# Patient Record
Sex: Male | Born: 1955
Health system: Southern US, Community
[De-identification: ages and names within clinical notes are randomized; demographics above are authoritative.]

## PROBLEM LIST (undated history)

## (undated) DIAGNOSIS — I5020 Unspecified systolic (congestive) heart failure: Secondary | ICD-10-CM

## (undated) DIAGNOSIS — J449 Chronic obstructive pulmonary disease, unspecified: Secondary | ICD-10-CM

## (undated) DIAGNOSIS — G4733 Obstructive sleep apnea (adult) (pediatric): Secondary | ICD-10-CM

## (undated) DIAGNOSIS — I272 Pulmonary hypertension, unspecified: Secondary | ICD-10-CM

## (undated) DIAGNOSIS — I428 Other cardiomyopathies: Secondary | ICD-10-CM

## (undated) DIAGNOSIS — C439 Malignant melanoma of skin, unspecified: Secondary | ICD-10-CM

## (undated) HISTORY — DX: Unspecified systolic (congestive) heart failure: I50.20

## (undated) HISTORY — DX: Pulmonary hypertension, unspecified: I27.20

## (undated) HISTORY — DX: Obstructive sleep apnea (adult) (pediatric): G47.33

## (undated) HISTORY — PX: ADENOIDECTOMY: SUR15

## (undated) HISTORY — DX: Other cardiomyopathies: I42.8

---

## 1998-03-14 ENCOUNTER — Ambulatory Visit (HOSPITAL_BASED_OUTPATIENT_CLINIC_OR_DEPARTMENT_OTHER): Admission: RE | Admit: 1998-03-14 | Discharge: 1998-03-14 | Payer: Self-pay | Admitting: General Surgery

## 2019-06-01 ENCOUNTER — Ambulatory Visit: Payer: Self-pay | Admitting: *Deleted

## 2019-06-01 ENCOUNTER — Encounter (HOSPITAL_BASED_OUTPATIENT_CLINIC_OR_DEPARTMENT_OTHER): Payer: Self-pay | Admitting: *Deleted

## 2019-06-01 ENCOUNTER — Other Ambulatory Visit: Payer: Self-pay

## 2019-06-01 ENCOUNTER — Inpatient Hospital Stay (HOSPITAL_BASED_OUTPATIENT_CLINIC_OR_DEPARTMENT_OTHER)
Admission: EM | Admit: 2019-06-01 | Discharge: 2019-06-07 | DRG: 286 | Disposition: A | Discharge status: Home / Self Care | Payer: Self-pay | Attending: Internal Medicine | Admitting: Internal Medicine

## 2019-06-01 ENCOUNTER — Emergency Department (HOSPITAL_BASED_OUTPATIENT_CLINIC_OR_DEPARTMENT_OTHER): Payer: Self-pay

## 2019-06-01 DIAGNOSIS — Z87891 Personal history of nicotine dependence: Secondary | ICD-10-CM

## 2019-06-01 DIAGNOSIS — I429 Cardiomyopathy, unspecified: Secondary | ICD-10-CM

## 2019-06-01 DIAGNOSIS — I2729 Other secondary pulmonary hypertension: Secondary | ICD-10-CM | POA: Diagnosis present

## 2019-06-01 DIAGNOSIS — Z791 Long term (current) use of non-steroidal anti-inflammatories (NSAID): Secondary | ICD-10-CM

## 2019-06-01 DIAGNOSIS — J441 Chronic obstructive pulmonary disease with (acute) exacerbation: Secondary | ICD-10-CM | POA: Diagnosis present

## 2019-06-01 DIAGNOSIS — I509 Heart failure, unspecified: Secondary | ICD-10-CM

## 2019-06-01 DIAGNOSIS — I10 Essential (primary) hypertension: Secondary | ICD-10-CM

## 2019-06-01 DIAGNOSIS — I110 Hypertensive heart disease with heart failure: Principal | ICD-10-CM | POA: Diagnosis present

## 2019-06-01 DIAGNOSIS — I491 Atrial premature depolarization: Secondary | ICD-10-CM | POA: Diagnosis present

## 2019-06-01 DIAGNOSIS — I5021 Acute systolic (congestive) heart failure: Secondary | ICD-10-CM

## 2019-06-01 DIAGNOSIS — I4510 Unspecified right bundle-branch block: Secondary | ICD-10-CM | POA: Diagnosis present

## 2019-06-01 DIAGNOSIS — J986 Disorders of diaphragm: Secondary | ICD-10-CM | POA: Diagnosis present

## 2019-06-01 DIAGNOSIS — I2609 Other pulmonary embolism with acute cor pulmonale: Secondary | ICD-10-CM | POA: Diagnosis present

## 2019-06-01 DIAGNOSIS — E874 Mixed disorder of acid-base balance: Secondary | ICD-10-CM | POA: Diagnosis present

## 2019-06-01 DIAGNOSIS — I2781 Cor pulmonale (chronic): Secondary | ICD-10-CM

## 2019-06-01 DIAGNOSIS — E870 Hyperosmolality and hypernatremia: Secondary | ICD-10-CM | POA: Diagnosis not present

## 2019-06-01 DIAGNOSIS — I428 Other cardiomyopathies: Secondary | ICD-10-CM | POA: Diagnosis present

## 2019-06-01 DIAGNOSIS — I5041 Acute combined systolic (congestive) and diastolic (congestive) heart failure: Secondary | ICD-10-CM | POA: Diagnosis present

## 2019-06-01 DIAGNOSIS — Z6841 Body Mass Index (BMI) 40.0 and over, adult: Secondary | ICD-10-CM

## 2019-06-01 DIAGNOSIS — I5082 Biventricular heart failure: Secondary | ICD-10-CM | POA: Diagnosis present

## 2019-06-01 DIAGNOSIS — I493 Ventricular premature depolarization: Secondary | ICD-10-CM | POA: Diagnosis present

## 2019-06-01 DIAGNOSIS — Z23 Encounter for immunization: Secondary | ICD-10-CM

## 2019-06-01 DIAGNOSIS — E8881 Metabolic syndrome: Secondary | ICD-10-CM | POA: Diagnosis present

## 2019-06-01 DIAGNOSIS — Z20828 Contact with and (suspected) exposure to other viral communicable diseases: Secondary | ICD-10-CM | POA: Diagnosis present

## 2019-06-01 DIAGNOSIS — E662 Morbid (severe) obesity with alveolar hypoventilation: Secondary | ICD-10-CM | POA: Diagnosis present

## 2019-06-01 DIAGNOSIS — I471 Supraventricular tachycardia: Secondary | ICD-10-CM | POA: Diagnosis present

## 2019-06-01 DIAGNOSIS — I2510 Atherosclerotic heart disease of native coronary artery without angina pectoris: Secondary | ICD-10-CM | POA: Diagnosis present

## 2019-06-01 DIAGNOSIS — J9621 Acute and chronic respiratory failure with hypoxia: Secondary | ICD-10-CM | POA: Diagnosis present

## 2019-06-01 DIAGNOSIS — Z79899 Other long term (current) drug therapy: Secondary | ICD-10-CM

## 2019-06-01 HISTORY — DX: Chronic obstructive pulmonary disease, unspecified: J44.9

## 2019-06-01 LAB — CBC WITH DIFFERENTIAL/PLATELET
Abs Immature Granulocytes: 0.04 10*3/uL (ref 0.00–0.07)
Basophils Absolute: 0 10*3/uL (ref 0.0–0.1)
Basophils Relative: 0 %
Eosinophils Absolute: 0.1 10*3/uL (ref 0.0–0.5)
Eosinophils Relative: 1 %
HCT: 50.4 % (ref 39.0–52.0)
Hemoglobin: 15 g/dL (ref 13.0–17.0)
Immature Granulocytes: 0 %
Lymphocytes Relative: 13 %
Lymphs Abs: 1.2 10*3/uL (ref 0.7–4.0)
MCH: 28.1 pg (ref 26.0–34.0)
MCHC: 29.8 g/dL — ABNORMAL LOW (ref 30.0–36.0)
MCV: 94.6 fL (ref 80.0–100.0)
Monocytes Absolute: 0.8 10*3/uL (ref 0.1–1.0)
Monocytes Relative: 9 %
Neutro Abs: 7.2 10*3/uL (ref 1.7–7.7)
Neutrophils Relative %: 77 %
Platelets: 189 10*3/uL (ref 150–400)
RBC: 5.33 MIL/uL (ref 4.22–5.81)
RDW: 14.4 % (ref 11.5–15.5)
WBC: 9.4 10*3/uL (ref 4.0–10.5)
nRBC: 0 % (ref 0.0–0.2)

## 2019-06-01 LAB — COMPREHENSIVE METABOLIC PANEL
ALT: 28 U/L (ref 0–44)
AST: 24 U/L (ref 15–41)
Albumin: 3.9 g/dL (ref 3.5–5.0)
Alkaline Phosphatase: 64 U/L (ref 38–126)
Anion gap: 11 (ref 5–15)
BUN: 18 mg/dL (ref 8–23)
CO2: 35 mmol/L — ABNORMAL HIGH (ref 22–32)
Calcium: 8.8 mg/dL — ABNORMAL LOW (ref 8.9–10.3)
Chloride: 96 mmol/L — ABNORMAL LOW (ref 98–111)
Creatinine, Ser: 0.92 mg/dL (ref 0.61–1.24)
GFR calc Af Amer: >60 mL/min (ref >60)
GFR calc non Af Amer: >60 mL/min (ref >60)
Glucose, Bld: 164 mg/dL — ABNORMAL HIGH (ref 70–99)
Potassium: 3.6 mmol/L (ref 3.5–5.1)
Sodium: 142 mmol/L (ref 135–145)
Total Bilirubin: 0.8 mg/dL (ref 0.3–1.2)
Total Protein: 6.4 g/dL — ABNORMAL LOW (ref 6.5–8.1)

## 2019-06-01 LAB — BRAIN NATRIURETIC PEPTIDE: B Natriuretic Peptide: 322.5 pg/mL — ABNORMAL HIGH (ref 0.0–100.0)

## 2019-06-01 MED ORDER — ENOXAPARIN SODIUM 80 MG/0.8ML ~~LOC~~ SOLN
80.0000 mg | SUBCUTANEOUS | Status: DC
  Administered 2019-06-02 – 2019-06-07 (×6): 80 mg via SUBCUTANEOUS
  Filled 2019-06-01 (×6): qty 0.8

## 2019-06-01 MED ORDER — FUROSEMIDE 10 MG/ML IJ SOLN
40.0000 mg | Freq: Once | INTRAMUSCULAR | Status: AC
  Administered 2019-06-01: 40 mg via INTRAVENOUS
  Filled 2019-06-01: qty 4

## 2019-06-01 MED ORDER — INFLUENZA VAC SPLIT QUAD 0.5 ML IM SUSY
0.5000 mL | PREFILLED_SYRINGE | INTRAMUSCULAR | Status: AC
  Administered 2019-06-02: 11:00:00 0.5 mL via INTRAMUSCULAR
  Filled 2019-06-01: qty 0.5

## 2019-06-01 NOTE — ED Notes (Signed)
[facility] notified (Justin Rasmussen) - patient ready for transport 

## 2019-06-01 NOTE — H&P (Signed)
History and Physical    Justin Rasmussen OZH:[id number] DOB: [birth year] DOA: 06/01/2019  PCP: Patient, No Pcp Per  Patient coming from: Home and lives with wife  I have personally briefly reviewed patient's old medical records in [facility]  Chief Complaint: worsening shortness of breath and lower extremity edema  HPI: Justin Rasmussen is a 63 y.o. male with unknown medical history presented as a transfer from [facility] for worsening shortness of breath and lower extremity edema.  Patient has not seen a physician for several years.  Reports he was told he might have a diagnosis of COPD many years ago but has not had any issues with this.  He reports that for the past several weeks he has noticed increasing shortness of breath with exertion anophthalmia.  Also reports waking up in the middle of the night but unsure whether it is due to his breathing.  He also noticed more gradual lower extremity edema in the past several days.  He reportedly presented to the ED with oxygen saturation of 77% on room air and had to be placed on 4 L with improvement of oxygen saturation to 95%.  He denies any chest pain or palpitations.  Denies any fevers or new coughs.  Endorses a past history of tobacco use about 30 years ago.  Denies any alcohol or illicit drug use.  Reports that his father had a history of CHF and type 2 diabetes.  ED Course: He was afebrile and mildly hypertensive at times up to 150s over 100.  CBC showed no leukocytosis or anemia.CMP showed stable creatinine.  BNP of 323.  Chest x-ray showed right-sided effusion with underlying opacity representing atelectasis versus infiltrate.  EKG showed sinus tachycardia with right bundle branch block.  He received IV 40 mg Lasix in the ED with good urine output.  Review of Systems:  Constitutional: + Weight Change, No Fever ENT/Mouth: No sore throat, No Rhinorrhea Eyes: No Eye Pain, No Vision Changes Cardiovascular: No Chest Pain, + SOB, + PND, + Dyspnea on  Exertion, + Orthopnea,  +Edema, No Palpitations Respiratory: No Cough, No Sputum, No Wheezing,+ Dyspnea  Gastrointestinal: No Nausea, No Vomiting, No Diarrhea, No Constipation, No Pain Genitourinary: no Urinary Incontinence, No Urgency, No Flank Pain Musculoskeletal: No Arthralgias, No Myalgias Skin: No Skin Lesions, No Pruritus, Neuro: no Weakness, No Numbness,  No Loss of Consciousness, No Syncope Psych: No Anxiety/Panic, No Depression, decrease appetite Heme/Lymph: No Bruising, No Bleeding Past Medical History:  Diagnosis Date  . COPD (chronic obstructive pulmonary disease) (HCC)     History reviewed. No pertinent surgical history.   reports that he quit smoking about 30 years ago. His smoking use included cigarettes. He has never used smokeless tobacco. He reports previous alcohol use. No history on file for drug.  No Known Allergies  History reviewed. No pertinent family history. Father-CHF and diabetes  Prior to Admission medications   Not on File    Physical Exam: Vitals:   06/01/19 2030 06/01/19 2100 06/01/19 2130 06/01/19 2226  BP: 121/87 (!) 130/100 118/80 (!) 155/98  Pulse: (!) 40 98 92 (!) 104  Resp: (!) 22 (!) 24 (!) 23 20  Temp:    99.3 F (37.4 C)  TempSrc:    Oral  SpO2: 97% 93% 95% 98%  Weight:      Height:        Constitutional: NAD, calm, comfortable at 30 degrees incline lying in bed watching TV Vitals:   06/01/19 2030 06/01/19 2100 06/01/19  2130 06/01/19 2226  BP: 121/87 (!) 130/100 118/80 (!) 155/98  Pulse: (!) 40 98 92 (!) 104  Resp: (!) 22 (!) 24 (!) 23 20  Temp:    99.3 F (37.4 C)  TempSrc:    Oral  SpO2: 97% 93% 95% 98%  Weight:      Height:       Eyes: PERRL, lids and conjunctivae normal ENMT: Mucous membranes are moist. Posterior pharynx clear of any exudate or lesions.Normal dentition.  Neck: normal, supple, no masses Respiratory: Diffuse expiratory wheezing with decreased aeration especially to right lung base.  Notable shortness  of breath at breath on 4 L via nasal cannula. No accessory muscle use.  Cardiovascular: Regular rate and rhythm, no murmurs / rubs / gallops.  Significant 3+ pitting edema of bilateral lower extremity up to the knees.  abdomen: Obese abdomen, no tenderness, no masses palpated.  Bowel sounds positive.  Musculoskeletal: no clubbing / cyanosis. No joint deformity upper and lower extremities. Good ROM, no contractures. Normal muscle tone.  Skin: no rashes, lesions, ulcers. No induration.  Small abrasion with clean bandage on right pretibial region. Neurologic: CN 2-12 grossly intact. Sensation intact, DTR normal. Strength 5/5 in all 4.  Psychiatric: Normal judgment and insight. Alert and oriented x 3.  Somewhat depressed mood given new diagnosis.  Labs on Admission: I have personally reviewed following labs and imaging studies  CBC: Recent Labs  Lab 06/01/19 1630  WBC 9.4  NEUTROABS 7.2  HGB 15.0  HCT 50.4  MCV 94.6  PLT 189   Basic Metabolic Panel: Recent Labs  Lab 06/01/19 1630  NA 142  K 3.6  CL 96*  CO2 35*  GLUCOSE 164*  BUN 18  CREATININE 0.92  CALCIUM 8.8*   GFR: Estimated Creatinine Clearance: 125 mL/min (by C-G formula based on SCr of 0.92 mg/dL). Liver Function Tests: Recent Labs  Lab 06/01/19 1630  AST 24  ALT 28  ALKPHOS 64  BILITOT 0.8  PROT 6.4*  ALBUMIN 3.9   No results for input(s): LIPASE, AMYLASE in the last 168 hours. No results for input(s): AMMONIA in the last 168 hours. Coagulation Profile: No results for input(s): INR, PROTIME in the last 168 hours. Cardiac Enzymes: No results for input(s): CKTOTAL, CKMB, CKMBINDEX, TROPONINI in the last 168 hours. BNP (last 3 results) No results for input(s): PROBNP in the last 8760 hours. HbA1C: No results for input(s): HGBA1C in the last 72 hours. CBG: No results for input(s): GLUCAP in the last 168 hours. Lipid Profile: No results for input(s): CHOL, HDL, LDLCALC, TRIG, CHOLHDL, LDLDIRECT in the last  72 hours. Thyroid Function Tests: No results for input(s): TSH, T4TOTAL, FREET4, T3FREE, THYROIDAB in the last 72 hours. Anemia Panel: No results for input(s): VITAMINB12, FOLATE, FERRITIN, TIBC, IRON, RETICCTPCT in the last 72 hours. Urine analysis: No results found for: COLORURINE, APPEARANCEUR, LABSPEC, PHURINE, GLUCOSEU, HGBUR, BILIRUBINUR, KETONESUR, PROTEINUR, UROBILINOGEN, NITRITE, LEUKOCYTESUR  Radiological Exams on Admission: Dg Chest Portable 1 View  Result Date: 06/01/2019 CLINICAL DATA:  Shortness of breath with exertion. EXAM: PORTABLE CHEST 1 VIEW COMPARISON:  None. FINDINGS: Elevation right hemidiaphragm identified. There appears to be a right-sided effusion as well. Suspected cardiomegaly. The hila and mediastinum are unremarkable given portable technique. No pneumothorax. The left lung is clear. IMPRESSION: 1. There is elevation of the right hemidiaphragm. There is a right-sided effusion, possibly sub pulmonic, with underlying opacity, which could represent atelectasis or infiltrate. No other acute abnormalities are identified. Electronically Signed  By: Gerome Sam III M.D   On: 06/01/2019 16:50    EKG: Independently reviewed.   Assessment/Plan  Suspected congestive heart failure exacerbation -Start IV 40 mg Lasix daily -Obtain echocardiogram -Monitor daily I's and O's.  Daily weights. -Mildly elevated glucose on admission.  Will check hemoglobin A1c to evaluate for potential undiagnosed diabetes  COPD exacerbation secondary to CHF exacerbation - duonebs q4hrs  - albuterol PRN  -No new cough or sputum production to warrant antibiotics at this time  Hypertension -Continue to monitor with daily Lasix.  Will need ACE   DVT prophylaxis:.Lovenox Code Status:Full Family Communication: Plan discussed with patient at bedside  disposition Plan: Home with at least 2 midnight stays  Consults called:  Admission status: inpatient     Cordarro Spinnato T Almin Livingstone DO [facility]   If 7PM-7AM, please contact night-coverage [email or web address] Password [facility]  06/01/2019, 11:58 PM

## 2019-06-01 NOTE — ED Triage Notes (Signed)
Pt c/o SOb  With exertion and lower leg swelling  x 2 weeks

## 2019-06-01 NOTE — Telephone Encounter (Signed)
Patient's wife called to make NP appointment with provider- patient has swelling in both lags with slight weeping. Patient is SOB when laying and wife states he has gained weight. Patient has been advised to go to ED for evaluation and treatment. Not sure if patient will go- but he has been encouraged and wife is aware of advisement too. O2 sat 88, pulse- 102   Reason for Disposition . [1] Difficulty breathing with exertion (e.g., walking) AND [2] new onset or worsening  Answer Assessment - Initial Assessment Questions 1. ONSET: "When did the swelling start?" (e.g., minutes, hours, days)     2 days ago 2. LOCATION: "What part of the leg is swollen?"  "Are both legs swollen or just one leg?"     Knee down to foot- both legs- started in L leg and is now in R leg 3. SEVERITY: "How bad is the swelling?" (e.g., localized; mild, moderate, severe)  - Localized - small area of swelling localized to one leg  - MILD pedal edema - swelling limited to foot and ankle, pitting edema < 1/4 inch (6 mm) deep, rest and elevation eliminate most or all swelling  - MODERATE edema - swelling of lower leg to knee, pitting edema > 1/4 inch (6 mm) deep, rest and elevation only partially reduce swelling  - SEVERE edema - swelling extends above knee, facial or hand swelling present      Moderate/severe- patient has had weeping from the both legs- patient states can't physically see- but leaving wet areas 4. REDNESS: "Does the swelling look red or infected?"     no 5. PAIN: "Is the swelling painful to touch?" If so, ask: "How painful is it?"   (Scale 1-10; mild, moderate or severe)     No pain 6. FEVER: "Do you have a fever?" If so, ask: "What is it, how was it measured, and when did it start?"      No fever 7. CAUSE: "What do you think is causing the leg swelling?"     Not sure 8. MEDICAL HISTORY: "Do you have a history of heart failure, kidney disease, liver failure, or cancer?"     no 9. RECURRENT SYMPTOM: "Have  you had leg swelling before?" If so, ask: "When was the last time?" "What happened that time?"     no 10. OTHER SYMPTOMS: "Do you have any other symptoms?" (e.g., chest pain, difficulty breathing)       SOB, O2 stat- in the 80's 11. PREGNANCY: "Is there any chance you are pregnant?" "When was your last menstrual period?"       n/a  Protocols used: LEG SWELLING AND EDEMA-A-AH

## 2019-06-01 NOTE — ED Notes (Signed)
ED Provider at bedside. An RT at bedside

## 2019-06-01 NOTE — ED Notes (Signed)
After walking from [place] to lobby , pt with SOB and O2 sat  sat 77 on room . MD and RT at bedside

## 2019-06-01 NOTE — ED Provider Notes (Signed)
[facility] EMERGENCY DEPARTMENT Provider Note   CSN: [id number] Arrival date & time: 06/01/19  1618     History   Chief Complaint Chief Complaint  Patient presents with  . Shortness of Breath    HPI Justin Rasmussen is a 63 y.o. male.     The history is provided by the patient.  Shortness of Breath Severity:  Severe Onset quality:  Gradual Duration:  2 weeks Timing:  Constant Progression:  Worsening Chronicity:  New Context: activity   Relieved by:  Rest and sitting up Worsened by:  Activity (lying flat) Ineffective treatments:  None tried Associated symptoms: no abdominal pain, no chest pain, no cough, no fever, no headaches, no neck pain, no sputum production and no wheezing   Associated symptoms comment:  Leg swelling and pants fitting tighter around the abd.  DOE and orthopnea present.  Patient has not seen a doctor in greater than 5 years.  At one time was diagnosed with COPD.  No known COVID contacts.  Patient takes no medications at this time.  He is even noticed his legs have been weeping over the last few days.  Wife states they got a pulse oximeter and at home his oxygen level was reading between 70 and 80 at rest. Risk factors: obesity   Risk factors: no tobacco use     Past Medical History:  Diagnosis Date  . COPD (chronic obstructive pulmonary disease) (HCC)     There are no active problems to display for this patient.         Home Medications    Prior to Admission medications   Not on File    Family History No family history on file.  Social History Social History   Tobacco Use  . Smoking status: Not on file  Substance Use Topics  . Alcohol use: Not Currently  . Drug use: Not on file     Allergies   Patient has no known allergies.   Review of Systems Review of Systems  Constitutional: Negative for fever.  Respiratory: Positive for shortness of breath. Negative for cough, sputum production and wheezing.    Cardiovascular: Negative for chest pain.  Gastrointestinal: Negative for abdominal pain.  Musculoskeletal: Negative for neck pain.  Neurological: Negative for headaches.  All other systems reviewed and are negative.    Physical Exam Updated Vital Signs BP (!) 136/91   Pulse (!) 102   Temp 98.5 F (36.9 C)   Resp (!) 28   Ht 5\' 9"  (1.753 m)   Wt (!) 155.9 kg   SpO2 97%   BMI 50.77 kg/m   Physical Exam Vitals signs and nursing note reviewed.  Constitutional:      General: He is not in acute distress.    Appearance: He is well-developed. He is obese.  HENT:     Head: Normocephalic and atraumatic.  Eyes:     Conjunctiva/sclera: Conjunctivae normal.     Pupils: Pupils are equal, round, and reactive to light.  Neck:     Musculoskeletal: Normal range of motion and neck supple.  Cardiovascular:     Rate and Rhythm: Regular rhythm. Tachycardia present.     Heart sounds: No murmur.  Pulmonary:     Effort: Pulmonary effort is normal. Tachypnea present. No respiratory distress.     Breath sounds: Examination of the right-lower field reveals rales. Examination of the left-lower field reveals rales. Rales present. No wheezing.  Abdominal:     General: There is no distension.  Palpations: Abdomen is soft.     Tenderness: There is no abdominal tenderness. There is no guarding or rebound.  Musculoskeletal: Normal range of motion.        General: No tenderness.     Right lower leg: Edema present.     Left lower leg: Edema present.     Comments: 3+ pitting edema above the knee and weeping clear fluid from the lower legs  Skin:    General: Skin is warm and dry.     Findings: No erythema or rash.  Neurological:     General: No focal deficit present.     Mental Status: He is alert and oriented to person, place, and time.  Psychiatric:        Mood and Affect: Mood normal.        Behavior: Behavior normal.      ED Treatments / Results  Labs (all labs ordered are listed, but  only abnormal results are displayed) Labs Reviewed  CBC WITH DIFFERENTIAL/PLATELET - Abnormal; Notable for the following components:      Result Value   MCHC 29.8 (*)    All other components within normal limits  COMPREHENSIVE METABOLIC PANEL - Abnormal; Notable for the following components:   Chloride 96 (*)    CO2 35 (*)    Glucose, Bld 164 (*)    Calcium 8.8 (*)    Total Protein 6.4 (*)    All other components within normal limits  BRAIN NATRIURETIC PEPTIDE - Abnormal; Notable for the following components:   B Natriuretic Peptide 322.5 (*)    All other components within normal limits  SARS CORONAVIRUS 2 (TAT 6-24 HRS)    EKG EKG Interpretation  Date/Time:  Friday June 01 2019 16:28:58 EDT Ventricular Rate:  112 PR Interval:    QRS Duration: 136 QT Interval:  361 QTC Calculation: 493 R Axis:   -171 Text Interpretation:  Sinus tachycardia with irregular rate Right bundle branch block Anteroseptal infarct, age indeterminate Minimal ST elevation, inferior leads No previous tracing Confirmed by Blanchie Dessert [phone]) on 06/01/2019 5:42:48 PM   Radiology Dg Chest Portable 1 View  Result Date: 06/01/2019 CLINICAL DATA:  Shortness of breath with exertion. EXAM: PORTABLE CHEST 1 VIEW COMPARISON:  None. FINDINGS: Elevation right hemidiaphragm identified. There appears to be a right-sided effusion as well. Suspected cardiomegaly. The hila and mediastinum are unremarkable given portable technique. No pneumothorax. The left lung is clear. IMPRESSION: 1. There is elevation of the right hemidiaphragm. There is a right-sided effusion, possibly sub pulmonic, with underlying opacity, which could represent atelectasis or infiltrate. No other acute abnormalities are identified. Electronically Signed   By: Dorise Bullion III M.D   On: 06/01/2019 16:50    Procedures Procedures (including critical care time)  Medications Ordered in ED Medications  furosemide (LASIX) injection 40 mg (40  mg Intravenous Given 06/01/19 1801)     Initial Impression / Assessment and Plan / ED Course  I have reviewed the triage vital signs and the nursing notes.  Pertinent labs & imaging results that were available during my care of the patient were reviewed by me and considered in my medical decision making (see chart for details).        Patient is a 63 year old obese male who has not received medical care in greater than 5 years presenting today with 1 to 2 weeks of worsening exertional dyspnea.  On exam patient appears fluid overloaded with 3+ pitting edema in bilateral lower extremities with weeping,  abdominal fullness and rales on exam.  Initial saturation upon walking into the building was 77% on room air.  On 4 L patient's oxygen saturation is 95%.  Initially he was tachypneic in the 40s which is improved to the 30s and high 20s.  Patient's chest x-ray is consistent with a right-sided effusion and some atelectasis.  He has no symptoms today suggestive of COVID and is not having any infectious etiology.  BNP is elevated at 300, EKG with sinus tachycardia and a right bundle branch block.  There are no old to compare to but no signs of ST elevation today.  Patient at no time has had any chest discomfort or pain.  Renal function today is normal and blood sugar is 164.  Patient given IV Lasix.  Will admit for further work-up and evaluation of new onset CHF.  CRITICAL CARE Performed by: Devan Danzer Total critical care time: 30 minutes Critical care time was exclusive of separately billable procedures and treating other patients. Critical care was necessary to treat or prevent imminent or life-threatening deterioration. Critical care was time spent personally by me on the following activities: development of treatment plan with patient and/or surrogate as well as nursing, discussions with consultants, evaluation of patient's response to treatment, examination of patient, obtaining history from  patient or surrogate, ordering and performing treatments and interventions, ordering and review of laboratory studies, ordering and review of radiographic studies, pulse oximetry and re-evaluation of patient's condition.   Final Clinical Impressions(s) / ED Diagnoses   Final diagnoses:  Acute congestive heart failure, unspecified heart failure type [facility])    ED Discharge Orders    None       Gwyneth Sprout, MD 06/01/19 1816

## 2019-06-01 NOTE — Progress Notes (Signed)
[facility] transfer  63 year old obese male who has not seen a physician in at least 5 years presented to [facility] with several weeks of dyspnea on exertion and orthopnea.  He presented with oxygen saturation of 77% on room air and was tachycardic and tachypneic. 3+LE weeping edema.  Improved on 4 L with O2 sats of 95%.  BNP of 300.  Chest x-ray shows pulmonary edema.  Has already received IV Lasix. Creatinine and other labs stable.

## 2019-06-02 ENCOUNTER — Inpatient Hospital Stay (HOSPITAL_COMMUNITY): Payer: Self-pay

## 2019-06-02 DIAGNOSIS — J9601 Acute respiratory failure with hypoxia: Secondary | ICD-10-CM

## 2019-06-02 DIAGNOSIS — I5021 Acute systolic (congestive) heart failure: Secondary | ICD-10-CM

## 2019-06-02 DIAGNOSIS — I5031 Acute diastolic (congestive) heart failure: Secondary | ICD-10-CM

## 2019-06-02 DIAGNOSIS — R609 Edema, unspecified: Secondary | ICD-10-CM

## 2019-06-02 LAB — BASIC METABOLIC PANEL
Anion gap: 7 (ref 5–15)
Anion gap: 9 (ref 5–15)
BUN: 15 mg/dL (ref 8–23)
BUN: 15 mg/dL (ref 8–23)
CO2: 38 mmol/L — ABNORMAL HIGH (ref 22–32)
CO2: 38 mmol/L — ABNORMAL HIGH (ref 22–32)
Calcium: 8.3 mg/dL — ABNORMAL LOW (ref 8.9–10.3)
Calcium: 8.5 mg/dL — ABNORMAL LOW (ref 8.9–10.3)
Chloride: 95 mmol/L — ABNORMAL LOW (ref 98–111)
Chloride: 96 mmol/L — ABNORMAL LOW (ref 98–111)
Creatinine, Ser: 0.89 mg/dL (ref 0.61–1.24)
Creatinine, Ser: 0.97 mg/dL (ref 0.61–1.24)
GFR calc Af Amer: >60 mL/min (ref >60)
GFR calc Af Amer: >60 mL/min (ref >60)
GFR calc non Af Amer: >60 mL/min (ref >60)
GFR calc non Af Amer: >60 mL/min (ref >60)
Glucose, Bld: 121 mg/dL — ABNORMAL HIGH (ref 70–99)
Glucose, Bld: 130 mg/dL — ABNORMAL HIGH (ref 70–99)
Potassium: 3.6 mmol/L (ref 3.5–5.1)
Potassium: 3.8 mmol/L (ref 3.5–5.1)
Sodium: 141 mmol/L (ref 135–145)
Sodium: 142 mmol/L (ref 135–145)

## 2019-06-02 LAB — MAGNESIUM: Magnesium: 2.1 mg/dL (ref 1.7–2.4)

## 2019-06-02 LAB — ECHOCARDIOGRAM COMPLETE
Height: 69 in
Weight: 5376 oz

## 2019-06-02 LAB — CBC
HCT: 48.3 % (ref 39.0–52.0)
Hemoglobin: 15.2 g/dL (ref 13.0–17.0)
MCH: 29.6 pg (ref 26.0–34.0)
MCHC: 31.5 g/dL (ref 30.0–36.0)
MCV: 94 fL (ref 80.0–100.0)
Platelets: 167 10*3/uL (ref 150–400)
RBC: 5.14 MIL/uL (ref 4.22–5.81)
RDW: 14.4 % (ref 11.5–15.5)
WBC: 9.7 10*3/uL (ref 4.0–10.5)
nRBC: 0 % (ref 0.0–0.2)

## 2019-06-02 LAB — HEMOGLOBIN A1C
Hgb A1c MFr Bld: 6 % — ABNORMAL HIGH (ref 4.8–5.6)
Mean Plasma Glucose: 125.5 mg/dL

## 2019-06-02 LAB — SARS CORONAVIRUS 2 (TAT 6-24 HRS): SARS Coronavirus 2: NEGATIVE

## 2019-06-02 LAB — HIV ANTIBODY (ROUTINE TESTING W REFLEX): HIV Screen 4th Generation wRfx: NONREACTIVE

## 2019-06-02 MED ORDER — IPRATROPIUM-ALBUTEROL 0.5-2.5 (3) MG/3ML IN SOLN
3.0000 mL | Freq: Three times a day (TID) | RESPIRATORY_TRACT | Status: DC
  Administered 2019-06-02 – 2019-06-04 (×7): 3 mL via RESPIRATORY_TRACT
  Filled 2019-06-02 (×7): qty 3

## 2019-06-02 MED ORDER — FUROSEMIDE 10 MG/ML IJ SOLN
80.0000 mg | Freq: Two times a day (BID) | INTRAMUSCULAR | Status: AC
  Administered 2019-06-02 – 2019-06-06 (×8): 80 mg via INTRAVENOUS
  Filled 2019-06-02 (×9): qty 8

## 2019-06-02 MED ORDER — ALBUTEROL SULFATE (2.5 MG/3ML) 0.083% IN NEBU: 2.5000 mg | INHALATION_SOLUTION | RESPIRATORY_TRACT | Status: DC | PRN

## 2019-06-02 MED ORDER — IPRATROPIUM-ALBUTEROL 0.5-2.5 (3) MG/3ML IN SOLN
RESPIRATORY_TRACT | Status: AC
  Administered 2019-06-02: 3 mL
  Filled 2019-06-02: qty 3

## 2019-06-02 MED ORDER — IPRATROPIUM-ALBUTEROL 0.5-2.5 (3) MG/3ML IN SOLN
3.0000 mL | RESPIRATORY_TRACT | Status: DC
  Administered 2019-06-02 (×2): 3 mL via RESPIRATORY_TRACT
  Filled 2019-06-02: qty 30

## 2019-06-02 MED ORDER — MOMETASONE FURO-FORMOTEROL FUM 100-5 MCG/ACT IN AERO
2.0000 | INHALATION_SPRAY | Freq: Two times a day (BID) | RESPIRATORY_TRACT | Status: DC
  Administered 2019-06-02 – 2019-06-07 (×8): 2 via RESPIRATORY_TRACT
  Filled 2019-06-02: qty 8.8

## 2019-06-02 MED ORDER — POTASSIUM CHLORIDE CRYS ER 20 MEQ PO TBCR
40.0000 meq | EXTENDED_RELEASE_TABLET | Freq: Once | ORAL | Status: AC
  Administered 2019-06-02: 40 meq via ORAL
  Filled 2019-06-02: qty 4

## 2019-06-02 NOTE — TOC Initial Note (Signed)
Transition of Care [facility]) - Initial/Assessment Note    Patient Details  Name: Justin Rasmussen MRN: [id number] Date of Birth: March 12, 1956  [facility]) CM/SW Contact:    Zenon Mayo, RN Phone Number: 06/02/2019, 3:46 PM  Clinical Narrative:                 Patient is from home with spouse, he has no insurance, he states he will need ast with Match letter at discharge.  He has a follow up apt with MD at [facility] on Tuesday, listed on AVS.    Expected Discharge Plan: Home/Self Care Barriers to Discharge: No Barriers Identified   Patient Goals and CMS Choice Patient states their goals for this hospitalization and ongoing recovery are:: get better   Choice offered to / list presented to : NA  Expected Discharge Plan and Services Expected Discharge Plan: Home/Self Care In-house Referral: NA Discharge Planning Services: CM Consult Post Acute Care Choice: NA Living arrangements for the past 2 months: Single Family Home                 DME Arranged: (NA)         HH Arranged: NA          Prior Living Arrangements/Services Living arrangements for the past 2 months: Single Family Home Lives with:: Spouse Patient language and need for interpreter reviewed:: Yes Do you feel safe going back to the place where you live?: Yes      Need for Family Participation in Patient Care: Yes (Comment) Care giver support system in place?: Yes (comment)   Criminal Activity/Legal Involvement Pertinent to Current Situation/Hospitalization: No - Comment as needed  Activities of Daily Living Home Assistive Devices/Equipment: Dentures (specify type), Eyeglasses ADL Screening (condition at time of admission) Patient's cognitive ability adequate to safely complete daily activities?: Yes Is the patient deaf or have difficulty hearing?: No Does the patient have difficulty seeing, even when wearing glasses/contacts?: No Does the patient have difficulty concentrating,  remembering, or making decisions?: No Patient able to express need for assistance with ADLs?: Yes Does the patient have difficulty dressing or bathing?: No Independently performs ADLs?: Yes (appropriate for developmental age) Does the patient have difficulty walking or climbing stairs?: Yes Weakness of Legs: None Weakness of Arms/Hands: None  Permission Sought/Granted                  Emotional Assessment Appearance:: Appears stated age Attitude/Demeanor/Rapport: Gracious Affect (typically observed): Appropriate Orientation: : Oriented to Self, Oriented to Place, Oriented to  Time, Oriented to Situation Alcohol / Substance Use: Not Applicable Psych Involvement: No (comment)  Admission diagnosis:  Acute congestive heart failure, unspecified heart failure type [facility]) [I50.9] Patient Active Problem List   Diagnosis Date Noted  . Congestive heart failure (CHF) ([place]) 06/01/2019  . CHF (congestive heart failure) ([place]) 06/01/2019   PCP:  Patient, No Pcp Per Pharmacy:  No Pharmacies Listed    Social Determinants of Health (SDOH) Interventions    Readmission Risk Interventions No flowsheet data found.

## 2019-06-02 NOTE — Progress Notes (Signed)
  Echocardiogram 2D Echocardiogram has been performed.  Burnett Kanaris 06/02/2019, 10:22 AM

## 2019-06-02 NOTE — Progress Notes (Signed)
Patient ID: Justin Rasmussen, male   DOB: [birth year], 63 y.o.   MRN: [id number]  PROGRESS NOTE    Brick Barsky  NWG:[id number] DOB: 1956/06/18 DOA: 06/01/2019 PCP: Patient, No Pcp Per   Brief Narrative:  63 year old male with unknown medical history presented with worsening shortness of breath along with lower extremity edema on 06/01/2019.  He was found to be hypoxic on presentation and chest x-ray showed right-sided effusion with underlying opacity representing atelectasis versus infiltrate.  He was started on intravenous Lasix.  Assessment & Plan:   Acute hypoxic respiratory failure Probable acute diastolic CHF COPD, doubt that patient has exacerbation Undiagnosed probable obstructive sleep apnea versus obesity hypoventilation syndrome -On presentation, patient required 4 L oxygen.  Does not wear oxygen at home. -Currently requiring oxygen via nasal cannula at 2 L/min.  Wean off as able. -Although BNP was not that elevated, patient is extremely swollen in his lower extremities.  Patient does not have any fever or cough and does not show signs of pneumonia. -Will increase Lasix to 80 mg IV every 12 hours.  Check lower extremity duplex ultrasound.  Strict input and output.  Daily weights.  Follow 2D echo.  Might need cardiology evaluation as an outpatient. -Continue nebs.  Will add Dulera.  Not wheezing currently, no need for steroids at this time. -Might need outpatient sleep study and/or pulmonary evaluation  Morbid obesity  -Outpatient follow-up.  Counseled about lifestyle changes and weight loss    DVT prophylaxis: Lovenox Code Status: Full Family Communication: None at bedside Disposition Plan: Home in 2 to 3 days once clinically improved  Consultants: None  Procedures: None  Antimicrobials: None   Subjective: Patient seen and examined at bedside.  He feels slightly better but still feels very swollen and short of breath.  No current chest pain.  Patient denies any fever or  cough.  Objective: Vitals:   06/02/19 0456 06/02/19 0729 06/02/19 0804 06/02/19 0900  BP:  115/73    Pulse: 96 91  88  Resp: 18 19    Temp:  98.4 F (36.9 C)    TempSrc:  Oral    SpO2: 94% 95% 95%   Weight:      Height:        Intake/Output Summary (Last 24 hours) at 06/02/2019 0950 Last data filed at 06/02/2019 0700 Gross per 24 hour  Intake -  Output 1600 ml  Net -1600 ml   Filed Weights   06/01/19 1625 06/01/19 2226 06/02/19 0453  Weight: (!) 155.9 kg (!) 152 kg (!) 152.4 kg    Examination:  General exam: Appears calm and comfortable.  Looks older than stated age. Respiratory system: Bilateral decreased breath sounds at bases with basilar crackles Cardiovascular system: S1 & S2 heard, Rate controlled Gastrointestinal system: Abdomen is morbidly obese, nondistended, soft and nontender. Normal bowel sounds heard. Extremities: No cyanosis, clubbing; bilateral lower extremity 2-3+ edema Central nervous system: Alert and oriented. No focal neurological deficits. Moving extremities Skin: No rashes, lesions or ulcers Psychiatry: Flat affect    Data Reviewed: I have personally reviewed following labs and imaging studies  CBC: Recent Labs  Lab 06/01/19 1630 06/02/19 0000  WBC 9.4 9.7  NEUTROABS 7.2  --   HGB 15.0 15.2  HCT 50.4 48.3  MCV 94.6 94.0  PLT 189 167   Basic Metabolic Panel: Recent Labs  Lab 06/01/19 1630 06/02/19 0000  NA 142 142  K 3.6 3.6  CL 96* 95*  CO2 35* 38*  GLUCOSE 164*  130*  BUN 18 15  CREATININE 0.92 0.97  CALCIUM 8.8* 8.5*   GFR: Estimated Creatinine Clearance: 117 mL/min (by C-G formula based on SCr of 0.97 mg/dL). Liver Function Tests: Recent Labs  Lab 06/01/19 1630  AST 24  ALT 28  ALKPHOS 64  BILITOT 0.8  PROT 6.4*  ALBUMIN 3.9   No results for input(s): LIPASE, AMYLASE in the last 168 hours. No results for input(s): AMMONIA in the last 168 hours. Coagulation Profile: No results for input(s): INR, PROTIME in the  last 168 hours. Cardiac Enzymes: No results for input(s): CKTOTAL, CKMB, CKMBINDEX, TROPONINI in the last 168 hours. BNP (last 3 results) No results for input(s): PROBNP in the last 8760 hours. HbA1C: Recent Labs    06/02/19 0344  HGBA1C 6.0*   CBG: No results for input(s): GLUCAP in the last 168 hours. Lipid Profile: No results for input(s): CHOL, HDL, LDLCALC, TRIG, CHOLHDL, LDLDIRECT in the last 72 hours. Thyroid Function Tests: No results for input(s): TSH, T4TOTAL, FREET4, T3FREE, THYROIDAB in the last 72 hours. Anemia Panel: No results for input(s): VITAMINB12, FOLATE, FERRITIN, TIBC, IRON, RETICCTPCT in the last 72 hours. Sepsis Labs: No results for input(s): PROCALCITON, LATICACIDVEN in the last 168 hours.  Recent Results (from the past 240 hour(s))  SARS CORONAVIRUS 2 (TAT 6-24 HRS) Nasopharyngeal Nasopharyngeal Swab     Status: None   Collection Time: 06/01/19  5:45 PM   Specimen: Nasopharyngeal Swab  Result Value Ref Range Status   SARS Coronavirus 2 NEGATIVE NEGATIVE Final    Comment: (NOTE) SARS-CoV-2 target nucleic acids are NOT DETECTED. The SARS-CoV-2 RNA is generally detectable in upper and lower respiratory specimens during the acute phase of infection. Negative results do not preclude SARS-CoV-2 infection, do not rule out co-infections with other pathogens, and should not be used as the sole basis for treatment or other patient management decisions. Negative results must be combined with clinical observations, patient history, and epidemiological information. The expected result is Negative. Fact Sheet for Patients: HairSlick.no Fact Sheet for Healthcare Providers: quierodirigir.com This test is not yet approved or cleared by the [place] FDA and  has been authorized for detection and/or diagnosis of SARS-CoV-2 by FDA under an Emergency Use Authorization (EUA). This EUA will remain  in effect  (meaning this test can be used) for the duration of the COVID-19 declaration under Section 56 4(b)(1) of the Act, 21 U.S.C. section 360bbb-3(b)(1), unless the authorization is terminated or revoked sooner. Performed at [facility], [address] [place], [place] [address]          Radiology Studies: Dg Chest Portable 1 View  Result Date: 06/01/2019 CLINICAL DATA:  Shortness of breath with exertion. EXAM: PORTABLE CHEST 1 VIEW COMPARISON:  None. FINDINGS: Elevation right hemidiaphragm identified. There appears to be a right-sided effusion as well. Suspected cardiomegaly. The hila and mediastinum are unremarkable given portable technique. No pneumothorax. The left lung is clear. IMPRESSION: 1. There is elevation of the right hemidiaphragm. There is a right-sided effusion, possibly sub pulmonic, with underlying opacity, which could represent atelectasis or infiltrate. No other acute abnormalities are identified. Electronically Signed   By: Gerome Sam III M.D   On: 06/01/2019 16:50        Scheduled Meds: . enoxaparin (LOVENOX) injection  80 mg Subcutaneous Q24H  . furosemide  80 mg Intravenous BID  . influenza vac split quadrivalent PF  0.5 mL Intramuscular Tomorrow-1000  . ipratropium-albuterol  3 mL Nebulization TID  Continuous Infusions:   LOS: 1 day        Glade Lloyd, MD [facility] 06/02/2019, 9:50 AM

## 2019-06-02 NOTE — Progress Notes (Signed)
VASCULAR LAB PRELIMINARY  PRELIMINARY  PRELIMINARY  PRELIMINARY  Bilateral lower extremity venous duplex completed.    Preliminary report:  See CV proc for preliminary   Taevion Sikora, RVT 06/02/2019, 2:06 PM

## 2019-06-02 NOTE — Plan of Care (Signed)
  Problem: Skin Integrity: Goal: Risk for impaired skin integrity will decrease Outcome: Completed/Met

## 2019-06-02 NOTE — Progress Notes (Signed)
RN spoke with patient placement who notified of sending out page to physician Flossie Buffy, MD

## 2019-06-02 NOTE — Progress Notes (Signed)
Patient had an episode of SVT with a heart rate of 174 bpm, he is fine in bed with no symptoms, MD is aware. Will continue to monitor.

## 2019-06-03 DIAGNOSIS — I4510 Unspecified right bundle-branch block: Secondary | ICD-10-CM | POA: Diagnosis present

## 2019-06-03 DIAGNOSIS — J986 Disorders of diaphragm: Secondary | ICD-10-CM | POA: Diagnosis present

## 2019-06-03 DIAGNOSIS — I5021 Acute systolic (congestive) heart failure: Secondary | ICD-10-CM

## 2019-06-03 DIAGNOSIS — I429 Cardiomyopathy, unspecified: Secondary | ICD-10-CM

## 2019-06-03 LAB — COMPREHENSIVE METABOLIC PANEL
ALT: 25 U/L (ref 0–44)
AST: 24 U/L (ref 15–41)
Albumin: 3.5 g/dL (ref 3.5–5.0)
Alkaline Phosphatase: 54 U/L (ref 38–126)
Anion gap: 10 (ref 5–15)
BUN: 12 mg/dL (ref 8–23)
CO2: 44 mmol/L — ABNORMAL HIGH (ref 22–32)
Calcium: 8.6 mg/dL — ABNORMAL LOW (ref 8.9–10.3)
Chloride: 91 mmol/L — ABNORMAL LOW (ref 98–111)
Creatinine, Ser: 0.97 mg/dL (ref 0.61–1.24)
GFR calc Af Amer: >60 mL/min (ref >60)
GFR calc non Af Amer: >60 mL/min (ref >60)
Glucose, Bld: 109 mg/dL — ABNORMAL HIGH (ref 70–99)
Potassium: 3.5 mmol/L (ref 3.5–5.1)
Sodium: 145 mmol/L (ref 135–145)
Total Bilirubin: 1.2 mg/dL (ref 0.3–1.2)
Total Protein: 5.7 g/dL — ABNORMAL LOW (ref 6.5–8.1)

## 2019-06-03 LAB — CBC WITH DIFFERENTIAL/PLATELET
Abs Immature Granulocytes: 0.03 10*3/uL (ref 0.00–0.07)
Basophils Absolute: 0.1 10*3/uL (ref 0.0–0.1)
Basophils Relative: 1 %
Eosinophils Absolute: 0.1 10*3/uL (ref 0.0–0.5)
Eosinophils Relative: 2 %
HCT: 46.5 % (ref 39.0–52.0)
Hemoglobin: 14.6 g/dL (ref 13.0–17.0)
Immature Granulocytes: 0 %
Lymphocytes Relative: 12 %
Lymphs Abs: 0.9 10*3/uL (ref 0.7–4.0)
MCH: 29.6 pg (ref 26.0–34.0)
MCHC: 31.4 g/dL (ref 30.0–36.0)
MCV: 94.1 fL (ref 80.0–100.0)
Monocytes Absolute: 0.7 10*3/uL (ref 0.1–1.0)
Monocytes Relative: 9 %
Neutro Abs: 5.8 10*3/uL (ref 1.7–7.7)
Neutrophils Relative %: 76 %
Platelets: 150 10*3/uL (ref 150–400)
RBC: 4.94 MIL/uL (ref 4.22–5.81)
RDW: 14.4 % (ref 11.5–15.5)
WBC: 7.6 10*3/uL (ref 4.0–10.5)
nRBC: 0 % (ref 0.0–0.2)

## 2019-06-03 LAB — BLOOD GAS, ARTERIAL
Acid-Base Excess: 20.1 mmol/L — ABNORMAL HIGH (ref 0.0–2.0)
Bicarbonate: 45.4 mmol/L — ABNORMAL HIGH (ref 20.0–28.0)
Drawn by: 246861
O2 Content: 3 L/min
O2 Saturation: 93.5 %
Patient temperature: 98.6
pCO2 arterial: 61.8 mmHg — ABNORMAL HIGH (ref 32.0–48.0)
pH, Arterial: 7.479 — ABNORMAL HIGH (ref 7.350–7.450)
pO2, Arterial: 65.8 mmHg — ABNORMAL LOW (ref 83.0–108.0)

## 2019-06-03 LAB — TROPONIN I (HIGH SENSITIVITY): Troponin I (High Sensitivity): 38 ng/L — ABNORMAL HIGH (ref <18)

## 2019-06-03 LAB — MAGNESIUM: Magnesium: 2 mg/dL (ref 1.7–2.4)

## 2019-06-03 MED ORDER — ACETAZOLAMIDE SODIUM 500 MG IJ SOLR
500.0000 mg | Freq: Once | INTRAMUSCULAR | Status: AC
  Administered 2019-06-03: 17:00:00 500 mg via INTRAVENOUS
  Filled 2019-06-03: qty 500

## 2019-06-03 MED ORDER — DEXTROSE 5 % IV SOLN
500.0000 mg | Freq: Once | INTRAVENOUS | Status: AC
  Administered 2019-06-03: 500 mg via INTRAVENOUS
  Filled 2019-06-03: qty 500

## 2019-06-03 MED ORDER — LOSARTAN POTASSIUM 50 MG PO TABS
50.0000 mg | ORAL_TABLET | Freq: Every day | ORAL | Status: DC
  Administered 2019-06-03 – 2019-06-07 (×4): 50 mg via ORAL
  Filled 2019-06-03 (×5): qty 1

## 2019-06-03 MED ORDER — ASPIRIN 81 MG PO CHEW
81.0000 mg | CHEWABLE_TABLET | Freq: Every day | ORAL | Status: DC
  Administered 2019-06-03 – 2019-06-07 (×4): 81 mg via ORAL
  Filled 2019-06-03 (×5): qty 1

## 2019-06-03 NOTE — Consult Note (Addendum)
Cardiology Consultation:   Patient ID: Macari Waffle MRN: [id number]; DOB: Mar 09, 1956  Admit date: 06/01/2019 Date of Consult: 06/03/2019  Primary Care Provider: Patient, No Pcp Per Primary Cardiologist: No primary care provider on file. Primary Electrophysiologist:  None    Patient Profile:   Erie Heckman is a 63 y.o. male with a hx of obesity who is being seen today for the evaluation of acute CHF at the request of Dr Hanley Ben.  History of Present Illness:   Mr. Kreyling is a pleasant 63 year old married male, he lives in [place].  He is a retired Personnel officer.  He tells me he has not seen a physician in more than 10 years.  He denies any prior cardiac evaluation.  He was on no medications prior to admission.  The patient tells me he is not very active at home.  He is morbidly obese with a BMI of 48.  Patient is noted increasing dyspnea on exertion over the last 2 to 3 weeks.  The last 2 to 3 days he has had increasing lower extremity edema and orthopnea.  He presented to the emergency room for further evaluation.  On admission he was found to be hypoxic, this improved with nasal oxygen.  His portable chest x-ray showed elevated right hemidiaphragm with a right effusion, the left lung was noted to be clear.  His EKG showed sinus rhythm with right bundle branch block and PACs.  His BNP came back only mildly elevated at 322.  The patient was put on Lasix 80 IV twice daily and has been diuresing.  Symptomatically he is improving.  An echocardiogram was done which showed an ejection fraction of 30 to 35%.  He has moderate septal wall thickness but no ventricular hypertrophy.  The anterior wall, lateral wall, and apical wall are all abnormal.  He had moderate left atrial enlargement and moderate Lee elevated pulmonary pressures.  Venous Dopplers of been negative for DVT.  The patient denies any chest pain history.  Heart Pathway Score:     Past Medical History:  Diagnosis Date  . COPD (chronic  obstructive pulmonary disease) (HCC)     History reviewed. No pertinent surgical history.   Home Medications:  Prior to Admission medications   Medication Sig Start Date End Date Taking? Authorizing Provider  acetaminophen (TYLENOL) 500 MG tablet Take 500-1,000 mg by mouth every 6 (six) hours as needed for mild pain or headache.   Yes [provider]  cetirizine (ZYRTEC) 10 MG tablet Take 10 mg by mouth daily as needed for allergies or rhinitis.   Yes [provider]  ibuprofen (ADVIL) 200 MG tablet Take 200-400 mg by mouth every 6 (six) hours as needed for headache or mild pain.   Yes [provider]    Inpatient Medications: Scheduled Meds: . enoxaparin (LOVENOX) injection  80 mg Subcutaneous Q24H  . furosemide  80 mg Intravenous BID  . ipratropium-albuterol  3 mL Nebulization TID  . mometasone-formoterol  2 puff Inhalation BID   Continuous Infusions:  PRN Meds: albuterol  Allergies:   No Known Allergies  Social History:   Social History   Socioeconomic History  . Marital status: Married    Spouse name: Not on file  . Number of children: Not on file  . Years of education: Not on file  . Highest education level: Not on file  Occupational History  . Not on file  Social Needs  . Financial resource strain: Not on file  . Food insecurity  Worry: Not on file    Inability: Not on file  . Transportation needs    Medical: Not on file    Non-medical: Not on file  Tobacco Use  . Smoking status: Former Smoker    Types: Cigarettes    Quit date: 05/31/1989    Years since quitting: 30.0  . Smokeless tobacco: Never Used  Substance and Sexual Activity  . Alcohol use: Not Currently  . Drug use: Not on file  . Sexual activity: Not on file  Lifestyle  . Physical activity    Days per week: Patient refused    Minutes per session: Patient refused  . Stress: Only a little  Relationships  . Social Musician on phone: Not on file    Gets  together: Not on file    Attends religious service: Not on file    Active member of club or organization: Not on file    Attends meetings of clubs or organizations: Not on file    Relationship status: Married  . Intimate partner violence    Fear of current or ex partner: Patient refused    Emotionally abused: Patient refused    Physically abused: Patient refused    Forced sexual activity: Patient refused  Other Topics Concern  . Not on file  Social History Narrative  . Not on file    Family History:   Remarkable for HTN and DM in his father  ROS:  Please see the history of present illness.  All other ROS reviewed and negative.     Physical Exam/Data:   Vitals:   06/02/19 2058 06/02/19 2359 06/03/19 0515 06/03/19 0847  BP:  118/73 131/72   Pulse:  89 97   Resp:  18 20   Temp:  98.5 F (36.9 C) 98.1 F (36.7 C)   TempSrc:  Oral Oral   SpO2: 95% 93% 92% 90%  Weight:   (!) 144.9 kg   Height:        Intake/Output Summary (Last 24 hours) at 06/03/2019 1017 Last data filed at 06/03/2019 0800 Gross per 24 hour  Intake 680 ml  Output 6525 ml  Net -5845 ml   Last 3 Weights 06/03/2019 06/02/2019 06/01/2019  Weight (lbs) 319 lb 6.4 oz 336 lb 335 lb 1.6 oz  Weight (kg) 144.879 kg 152.409 kg 152 kg     Body mass index is 47.17 kg/m.  General:  Morbidly obese Caucasian male, on O2, in NAD HEENT: normal Neck: no JVD Endocrine:  No thryomegaly Vascular: No obvious bruits on exam  Cardiac:  normal S1, S2; RRR; no murmur, decreased heart sounds overall Lungs:  Decreased breath sounds 1/3 up on Rt with faint expiratory wheezing bilaterally  Abd: obese, soft, nontender, no surgical scars Ext: 1-2+ bilateral LE edema Musculoskeletal:  No deformities, BUE and BLE strength normal and equal Skin: warm and dry  Neuro:  CNs 2-12 intact, no focal abnormalities noted Psych:  Normal affect   EKG:  The EKG was personally reviewed and demonstrates:  NSR, ST, RBBB, PACs Telemetry:   Telemetry was personally reviewed and demonstrates:  NSR, ST, frequent PACs  Relevant CV Studies: Echo 06/02/2019- IMPRESSIONS    1. Left ventricular ejection fraction, by visual estimation, is 30 to 35%. The left ventricle has severely decreased function. Left ventricular septal wall thickness was moderately increased. There is no left ventricular hypertrophy.  2. Entire lateral wall, entire anterior wall, and apex are abnormal.  3. Elevated mean left atrial  pressure.  4. Left ventricular diastolic Doppler parameters are consistent with pseudonormalization pattern of LV diastolic filling.  5. Global right ventricle has mildly reduced systolic function.The right ventricular size is severely enlarged. No increase in right ventricular wall thickness.  6. Left atrial size was moderately dilated.  7. Right atrial size was moderately dilated.  8. Mild mitral annular calcification.  9. The mitral valve is normal in structure. Trace mitral valve regurgitation. 10. The tricuspid valve is normal in structure. Tricuspid valve regurgitation is trivial. 11. The aortic valve is tricuspid Aortic valve regurgitation was not visualized by color flow Doppler. Mild aortic valve sclerosis without stenosis. 12. The pulmonic valve was not well visualized. Pulmonic valve regurgitation is not visualized by color flow Doppler. 13. Moderately elevated pulmonary artery systolic pressure. 14. The inferior vena cava is dilated in size with <50% respiratory variability, suggesting right atrial pressure of 15 mmHg.  Laboratory Data:  High Sensitivity Troponin:  No results for input(s): TROPONINIHS in the last 720 hours.   Chemistry Recent Labs  Lab 06/02/19 0000 06/02/19 1050 06/03/19 0514  NA 142 141 145  K 3.6 3.8 3.5  CL 95* 96* 91*  CO2 38* 38* 44*  GLUCOSE 130* 121* 109*  BUN 15 15 12   CREATININE 0.97 0.89 0.97  CALCIUM 8.5* 8.3* 8.6*  GFRNONAA >60 >60 >60  GFRAA >60 >60 >60  ANIONGAP 9 7 10      Recent Labs  Lab 06/01/19 1630 06/03/19 0514  PROT 6.4* 5.7*  ALBUMIN 3.9 3.5  AST 24 24  ALT 28 25  ALKPHOS 64 54  BILITOT 0.8 1.2   Hematology Recent Labs  Lab 06/01/19 1630 06/02/19 0000 06/03/19 0514  WBC 9.4 9.7 7.6  RBC 5.33 5.14 4.94  HGB 15.0 15.2 14.6  HCT 50.4 48.3 46.5  MCV 94.6 94.0 94.1  MCH 28.1 29.6 29.6  MCHC 29.8* 31.5 31.4  RDW 14.4 14.4 14.4  PLT 189 167 150   BNP Recent Labs  Lab 06/01/19 1630  BNP 322.5*    DDimer No results for input(s): DDIMER in the last 168 hours.   Radiology/Studies:  Dg Chest Portable 1 View  Result Date: 06/01/2019 CLINICAL DATA:  Shortness of breath with exertion. EXAM: PORTABLE CHEST 1 VIEW COMPARISON:  None. FINDINGS: Elevation right hemidiaphragm identified. There appears to be a right-sided effusion as well. Suspected cardiomegaly. The hila and mediastinum are unremarkable given portable technique. No pneumothorax. The left lung is clear. IMPRESSION: 1. There is elevation of the right hemidiaphragm. There is a right-sided effusion, possibly sub pulmonic, with underlying opacity, which could represent atelectasis or infiltrate. No other acute abnormalities are identified. Electronically Signed   By: Gerome Sam III M.D   On: 06/01/2019 16:50   Vas [place] Lower Extremity Venous (dvt)  Result Date: 06/03/2019  Lower Venous Study Indications: Edema, and SOB.  Limitations: Body habitus and edema. Comparison Study: No prior study on file for comparison Performing Technologist: Sherren Kerns RVS  Examination Guidelines: A complete evaluation includes B-mode imaging, spectral Doppler, color Doppler, and power Doppler as needed of all accessible portions of each vessel. Bilateral testing is considered an integral part of a complete examination. Limited examinations for reoccurring indications may be performed as noted.  +---------+---------------+---------+-----------+----------+--------------+ RIGHT     CompressibilityPhasicitySpontaneityPropertiesThrombus Aging +---------+---------------+---------+-----------+----------+--------------+ CFV      Full           Yes      Yes                                 +---------+---------------+---------+-----------+----------+--------------+  SFJ      Full                                                        +---------+---------------+---------+-----------+----------+--------------+ FV Prox  Full                                                        +---------+---------------+---------+-----------+----------+--------------+ FV Mid   Full                                                        +---------+---------------+---------+-----------+----------+--------------+ FV DistalFull                                                        +---------+---------------+---------+-----------+----------+--------------+ PFV      Full                                                        +---------+---------------+---------+-----------+----------+--------------+ POP      Full           Yes      Yes                                 +---------+---------------+---------+-----------+----------+--------------+ PTV      Full                                                        +---------+---------------+---------+-----------+----------+--------------+ PERO     Full                                                        +---------+---------------+---------+-----------+----------+--------------+   +---------+---------------+---------+-----------+----------+--------------+ LEFT     CompressibilityPhasicitySpontaneityPropertiesThrombus Aging +---------+---------------+---------+-----------+----------+--------------+ CFV      Full           Yes      Yes                                 +---------+---------------+---------+-----------+----------+--------------+ SFJ      Full                                                         +---------+---------------+---------+-----------+----------+--------------+  FV Prox  Full                                                        +---------+---------------+---------+-----------+----------+--------------+ FV Mid   Full                                                        +---------+---------------+---------+-----------+----------+--------------+ FV DistalFull                                                        +---------+---------------+---------+-----------+----------+--------------+ PFV      Full                                                        +---------+---------------+---------+-----------+----------+--------------+ POP      Full           Yes      Yes                                 +---------+---------------+---------+-----------+----------+--------------+ PTV      Full                                                        +---------+---------------+---------+-----------+----------+--------------+ PERO     Full                                                        +---------+---------------+---------+-----------+----------+--------------+     Summary: Right: There is no evidence of deep vein thrombosis in the lower extremity. Left: There is no evidence of deep vein thrombosis in the lower extremity.  *See table(s) above for measurements and observations. Electronically signed by Gretta Began MD on 06/03/2019 at 9:51:00 AM.    Final     Assessment and Plan:   Acute systolic CHF- Symptoms c/w CHF despite absence of CHF on CXR and relatively low BNP. He seems to be responding to diuresis.  Cardiomyopathy- Etiology not yet determined but CAD a concern based on WMA.  Check Troponin.  He may eventually need coronary angiogram.   Morbid obesity- Presumed sleep apnea, obesity hypoventilation contributing.   RBBB- No old EKGs  Plan: check Troponin, document TSH.  Continue IV Lasix.  Add low dose ASA.  Avoid  beta blocker with active wheezing.  Consider adding ARB.  MD to see.        For questions or updates, please contact [facility] Please consult [email or web address] for contact  info under     Signed, Corine Shelter, PA-C   06/03/2019 10:17 AM   CHF acute systolic  Cardiomyopathy with wall motion abnormality suggested of MI  Right Heart failure with hypercapnia  Morbid obesity  SVT  RBBB  Elevated R hemidiaphragm  Presents with subacute heart failure with R and L volume overload. Continue diuresis as this has been associated with interval improvement  LV function shows wall motion abnormality consistent with MI and needs cath, and with the relatively abrupt onset of symptoms, this could be post MI CHF  Also has OSA by hx and with elevated HCO3 at baseline, hypercapnia and hypoventilation.  Dont know if this is mechanical related to R hemidiaphragm or not, and may be worth getting pulm to see him while he is here  Freq runs of SVT may also be contributing or consequential to his cardiomyopathy-- hopefully will settle down on BB   Add losartan 25 daily Plan carvvediolol in the next 24 hrs  Cath possibly tomorrow, o/w wait until Tuesday Check ABG

## 2019-06-03 NOTE — Plan of Care (Signed)
Patient is educated about fluid intake, he is agreeable to limit his fluid intake to 1500 ml per 24 hours. He is in bed with no complains at this time.

## 2019-06-03 NOTE — Consult Note (Signed)
Justin Rasmussen, MRN:  [id number], DOB:  Nov 13, 1955, LOS: 2 ADMISSION DATE:  06/01/2019, CONSULTATION DATE:  9/27 REFERRING MD:  Graciela Husbands , CHIEF COMPLAINT:  Chronic respiratory failure   Brief History   Obese 62 year old male smoker admitted on 9/25 with newly identified systolic cardiomyopathy and related volume overload.  On further evaluation felt likely to have a complicating component of obstructive sleep apnea and possibly chronic hypoxic respiratory failure because of this pulmonary asked to assist with care  History of present illness    63 year old male patient who was admitted on 9/25 with chief complaint of worsening shortness of breath and lower extremity edema.  He was currently in his usual state of health up until about the last several weeks prior to admission. He presented with chief complaint as mentioned above with worsening lower extremity edema and shortness of breath also waking up in the middle of the night.  Upon arrival in the emergency room his saturations were noted at 77% on room air these improved to 95% once oxygen was applied.  He denied chest pain or palpitations he had no fevers cough or sick exposure.  He denies any alcohol he abuse or illicit drug abuse he does have a 30-year history of tobacco abuse. In the emergency room he was found to be for tensive.  Chest x-ray showed right sided effusion/atelectasis initial bicarbonate was 35 he was admitted with a working diagnosis of congestive heart failure and possible COPD exacerbation he was admitted to the medical service cardiology was asked to evaluate.  Cardiology evaluated on 9/27 at that point an echocardiogram had been already completed with newly identified systolic cardiomyopathy with a EF of 30 to 35% as well as anterior, lateral and apical wall abnormality.  He had moderately elevated pulmonary pressures, and also global right ventricular dysfunction with significant right ventricular hypertrophy.  He has a  probable history of obstructive sleep apnea, also concern for chronic hypoxia and because of this pulmonary was asked to evaluate particularly to see if he would be a candidate for nocturnal CPAP or BiPAP  Past Medical History  Morbid obesity with BMI of 48, h/o pleuritic chest pain several years ago     Significant Hospital Events   9/25 admitted with working diagnosis of decompensated heart failure 9/27: Cardiology consulted.  EF 30 to 35% with decreased biventricular function.  Arterial blood gas shows pH of 7.48, PCO2 of 62, PO2 of 66 , Bicarbonate of 45 saturations 93.5%.9/26: Currently 8.488 L negative  Consults:  Pulmonary  Procedures:    Significant Diagnostic Tests:  Echocardiogram: Left ventricular fraction 30 to 35% with severely decreased LV function moderately increased septal wall thickening.  Elevated left atrial pressure.  Global right ventricular reduced systolic function with severe right Ventricular hypertrophy right atrium mild to moderately dilated left atrium moderately dilated increased pulmonary artery systolic pressures  Micro Data:    Antimicrobials:    Interim history/subjective:  Feeling better   Objective   Blood pressure 118/75, pulse 88, temperature 98.7 F (37.1 C), temperature source Oral, resp. rate 18, height 5\' 9"  (1.753 m), weight (Abnormal) 144.9 kg, SpO2 92 %.    FiO2 (%):  [28 %-32 %] 32 %   Intake/Output Summary (Last 24 hours) at 06/03/2019 1337 Last data filed at 06/03/2019 1321 Gross per 24 hour  Intake 682 ml  Output 6950 ml  Net -6268 ml   Filed Weights   06/01/19 2226 06/02/19 0453 06/03/19 0515  Weight: (Abnormal) 152 kg (  Abnormal) 152.4 kg (Abnormal) 144.9 kg    Examination: General: obese white male resting in bed no acute distress  HENT: NCAT no JVD MMM Lungs: crackles bases. No accessory use  Cardiovascular: RRR; systolic murmur  Abdomen: soft + bowels sounds  Extremities: warm dry LE edema brisk CR  Neuro: awake  alert no focal def  GU: voiding   Resolved Hospital Problem list     Assessment & Plan:   Acute on chronic hypoxic respiratory failure in the setting of volume overload, and right lower lobe volume loss with probable element of effusion and atelectasis Newly diagnosed biventricular heart failure with EF 30 to 35% Pulmonary edema and pleural effusion Probable obstructive sleep apnea Possible obesity hypoventilation syndrome Elevated right hemidiaphragm (of unknown chronicity)   Discussion Suspect he's had chronic respiratory failure for some time on the basis of obesity, and probable OSA, OHS +/- element of obstructive lung disease. Also has elevated right HD of unclear chronicity. For now Best approach is to push diuresis and get him euvolemic. After we have achieved optimal fluid balance we need to check walking oximetry and also HS oximetry to determine oxygen needs.   Plan Cont lasix Cont oxygen wean for sats > 90% Will likely go home with oxygen Will ask social work to see him to assist w/ financial concerns He is planning on going on his wife's medical insurance after next enrollment. At that time We will need to proceed w/: PFTs to determine if he indeed has COPD, PSG to evaluate for sleep apnea and ALSO needs CT of chest to better evaluate the left Hemidiaphragm process.  Will give a couple doses of diamox for now given worsening contraction alkalosis.    Labs   CBC: Recent Labs  Lab 06/01/19 1630 06/02/19 0000 06/03/19 0514  WBC 9.4 9.7 7.6  NEUTROABS 7.2  --  5.8  HGB 15.0 15.2 14.6  HCT 50.4 48.3 46.5  MCV 94.6 94.0 94.1  PLT 189 167 150    Basic Metabolic Panel: Recent Labs  Lab 06/01/19 1630 06/02/19 0000 06/02/19 1050 06/03/19 0514  NA 142 142 141 145  K 3.6 3.6 3.8 3.5  CL 96* 95* 96* 91*  CO2 35* 38* 38* 44*  GLUCOSE 164* 130* 121* 109*  BUN 18 15 15 12   CREATININE 0.92 0.97 0.89 0.97  CALCIUM 8.8* 8.5* 8.3* 8.6*  MG  --   --  2.1 2.0   GFR:  Estimated Creatinine Clearance: 113.6 mL/min (by C-G formula based on SCr of 0.97 mg/dL). Recent Labs  Lab 06/01/19 1630 06/02/19 0000 06/03/19 0514  WBC 9.4 9.7 7.6    Liver Function Tests: Recent Labs  Lab 06/01/19 1630 06/03/19 0514  AST 24 24  ALT 28 25  ALKPHOS 64 54  BILITOT 0.8 1.2  PROT 6.4* 5.7*  ALBUMIN 3.9 3.5   No results for input(s): LIPASE, AMYLASE in the last 168 hours. No results for input(s): AMMONIA in the last 168 hours.  ABG    Component Value Date/Time   PHART 7.479 (H) 06/03/2019 1230   PCO2ART 61.8 (H) 06/03/2019 1230   PO2ART 65.8 (L) 06/03/2019 1230   HCO3 45.4 (H) 06/03/2019 1230   O2SAT 93.5 06/03/2019 1230     Coagulation Profile: No results for input(s): INR, PROTIME in the last 168 hours.  Cardiac Enzymes: No results for input(s): CKTOTAL, CKMB, CKMBINDEX, TROPONINI in the last 168 hours.  HbA1C: Hgb A1c MFr Bld  Date/Time Value Ref Range Status  06/02/2019  03:44 AM 6.0 (H) 4.8 - 5.6 % Final    Comment:    (NOTE) Pre diabetes:          5.7%-6.4% Diabetes:              >6.4% Glycemic control for   <7.0% adults with diabetes     CBG: No results for input(s): GLUCAP in the last 168 hours.  Review of Systems:   Review of Systems  Constitutional: Negative.   HENT: Negative.   Eyes: Negative.   Respiratory: Positive for shortness of breath and wheezing.   Cardiovascular: Positive for palpitations, leg swelling and PND. Negative for chest pain and claudication.  Gastrointestinal: Negative.   Genitourinary: Negative.   Musculoskeletal: Negative.   Skin: Negative.   Neurological: Negative.   Endo/Heme/Allergies: Negative.   Psychiatric/Behavioral: Negative.      Past Medical History  He,  has a past medical history of COPD (chronic obstructive pulmonary disease) (HCC).   Surgical History   History reviewed. No pertinent surgical history.   Social History   reports that he quit smoking about 30 years ago. His  smoking use included cigarettes. He has never used smokeless tobacco. He reports previous alcohol use.   Family History   His family history is not on file.   Allergies No Known Allergies   Home Medications  Prior to Admission medications   Medication Sig Start Date End Date Taking? Authorizing Provider  acetaminophen (TYLENOL) 500 MG tablet Take 500-1,000 mg by mouth every 6 (six) hours as needed for mild pain or headache.   Yes [provider]  cetirizine (ZYRTEC) 10 MG tablet Take 10 mg by mouth daily as needed for allergies or rhinitis.   Yes [provider]  ibuprofen (ADVIL) 200 MG tablet Take 200-400 mg by mouth every 6 (six) hours as needed for headache or mild pain.   Yes [provider]    Simonne Martinet ACNP-BC [facility] Pulmonary/Critical Care Pager # [phone] OR # [phone] if no answer

## 2019-06-03 NOTE — Progress Notes (Signed)
Patient ID: Justin Rasmussen, male   DOB: June 02, 1956, 63 y.o.   MRN: [id number]  PROGRESS NOTE    Justin Rasmussen  WUJ:[id number] DOB: 01-09-1956 DOA: 06/01/2019 PCP: Patient, No Pcp Per   Brief Narrative:  63 year old male with unknown medical history presented with worsening shortness of breath along with lower extremity edema on 06/01/2019.  He was found to be hypoxic on presentation and chest x-ray showed right-sided effusion with underlying opacity representing atelectasis versus infiltrate.  He was started on intravenous Lasix.  Assessment & Plan:   Acute hypoxic respiratory failure Probable acute systolic and diastolic CHF COPD, doubt that patient has exacerbation Undiagnosed probable obstructive sleep apnea versus obesity hypoventilation syndrome -On presentation, patient required 4 L oxygen.  Does not wear oxygen at home. -Currently still requiring oxygen via nasal cannula at 2 L/min.  Wean off as able. -Although BNP was not that elevated, patient is extremely swollen in his lower extremities.  Patient does not have any fever or cough and does not show signs of pneumonia. -Continue Lasix  80 mg IV every 12 hours.  lower extremity duplex ultrasound was negative for DVT.  Strict input and output.  Daily weights.  Negative balance of 6975 cc since admission.  Fluid restriction.  Echo shows EF of 30 to 35%.  Will request cardiology evaluation. -Continue nebs and Dulera.  Not wheezing currently, no need for steroids at this time. -Might need outpatient sleep study and/or pulmonary evaluation  Morbid obesity  -Outpatient follow-up.  Counseled about lifestyle changes and weight loss    DVT prophylaxis: Lovenox Code Status: Full Family Communication: None at bedside Disposition Plan: Home in 2 to 3 days once clinically improved  Consultants: None  Procedures:  Echo IMPRESSIONS    1. Left ventricular ejection fraction, by visual estimation, is 30 to 35%. The left ventricle has  severely decreased function. Left ventricular septal wall thickness was moderately increased. There is no left ventricular hypertrophy.  2. Entire lateral wall, entire anterior wall, and apex are abnormal.  3. Elevated mean left atrial pressure.  4. Left ventricular diastolic Doppler parameters are consistent with pseudonormalization pattern of LV diastolic filling.  5. Global right ventricle has mildly reduced systolic function.The right ventricular size is severely enlarged. No increase in right ventricular wall thickness.  6. Left atrial size was moderately dilated.  7. Right atrial size was moderately dilated.  8. Mild mitral annular calcification.  9. The mitral valve is normal in structure. Trace mitral valve regurgitation. 10. The tricuspid valve is normal in structure. Tricuspid valve regurgitation is trivial. 11. The aortic valve is tricuspid Aortic valve regurgitation was not visualized by color flow Doppler. Mild aortic valve sclerosis without stenosis. 12. The pulmonic valve was not well visualized. Pulmonic valve regurgitation is not visualized by color flow Doppler. 13. Moderately elevated pulmonary artery systolic pressure. 14. The inferior vena cava is dilated in size with <50% respiratory variability, suggesting right atrial pressure of 15 mmHg.  Antimicrobials: None   Subjective: Patient seen and examined at bedside.  Denies any overnight worsening shortness of breath, cough, fever, chest pain.  Feels slightly better but is still extremely short of breath with exertion. Objective: Vitals:   06/02/19 2053 06/02/19 2058 06/02/19 2359 06/03/19 0515  BP:   118/73 131/72  Pulse: 90  89 97  Resp: 18  18 20   Temp:   98.5 F (36.9 C) 98.1 F (36.7 C)  TempSrc:   Oral Oral  SpO2: 95% 95% 93% 92%  Weight:    Marland Kitchen)  144.9 kg  Height:        Intake/Output Summary (Last 24 hours) at 06/03/2019 0714 Last data filed at 06/03/2019 0600 Gross per 24 hour  Intake 800 ml  Output 6175  ml  Net -5375 ml   Filed Weights   06/01/19 2226 06/02/19 0453 06/03/19 0515  Weight: (!) 152 kg (!) 152.4 kg (!) 144.9 kg    Examination:  General exam: No acute distress.  Looks older than stated age. Respiratory system: Bilateral decreased breath sounds at bases with basilar crackles.  No wheezing  cardiovascular system: Rate controlled, S1-S2 heard Gastrointestinal system: Abdomen is morbidly obese, nondistended, soft and nontender. Normal bowel sounds heard. Extremities: No cyanosis, clubbing; bilateral lower extremity 2+ edema   Data Reviewed: I have personally reviewed following labs and imaging studies  CBC: Recent Labs  Lab 06/01/19 1630 06/02/19 0000 06/03/19 0514  WBC 9.4 9.7 7.6  NEUTROABS 7.2  --  5.8  HGB 15.0 15.2 14.6  HCT 50.4 48.3 46.5  MCV 94.6 94.0 94.1  PLT 189 167 150   Basic Metabolic Panel: Recent Labs  Lab 06/01/19 1630 06/02/19 0000 06/02/19 1050 06/03/19 0514  NA 142 142 141 145  K 3.6 3.6 3.8 3.5  CL 96* 95* 96* 91*  CO2 35* 38* 38* 44*  GLUCOSE 164* 130* 121* 109*  BUN 18 15 15 12   CREATININE 0.92 0.97 0.89 0.97  CALCIUM 8.8* 8.5* 8.3* 8.6*  MG  --   --  2.1 2.0   GFR: Estimated Creatinine Clearance: 113.6 mL/min (by C-G formula based on SCr of 0.97 mg/dL). Liver Function Tests: Recent Labs  Lab 06/01/19 1630 06/03/19 0514  AST 24 24  ALT 28 25  ALKPHOS 64 54  BILITOT 0.8 1.2  PROT 6.4* 5.7*  ALBUMIN 3.9 3.5   No results for input(s): LIPASE, AMYLASE in the last 168 hours. No results for input(s): AMMONIA in the last 168 hours. Coagulation Profile: No results for input(s): INR, PROTIME in the last 168 hours. Cardiac Enzymes: No results for input(s): CKTOTAL, CKMB, CKMBINDEX, TROPONINI in the last 168 hours. BNP (last 3 results) No results for input(s): PROBNP in the last 8760 hours. HbA1C: Recent Labs    06/02/19 0344  HGBA1C 6.0*   CBG: No results for input(s): GLUCAP in the last 168 hours. Lipid Profile: No  results for input(s): CHOL, HDL, LDLCALC, TRIG, CHOLHDL, LDLDIRECT in the last 72 hours. Thyroid Function Tests: No results for input(s): TSH, T4TOTAL, FREET4, T3FREE, THYROIDAB in the last 72 hours. Anemia Panel: No results for input(s): VITAMINB12, FOLATE, FERRITIN, TIBC, IRON, RETICCTPCT in the last 72 hours. Sepsis Labs: No results for input(s): PROCALCITON, LATICACIDVEN in the last 168 hours.  Recent Results (from the past 240 hour(s))  SARS CORONAVIRUS 2 (TAT 6-24 HRS) Nasopharyngeal Nasopharyngeal Swab     Status: None   Collection Time: 06/01/19  5:45 PM   Specimen: Nasopharyngeal Swab  Result Value Ref Range Status   SARS Coronavirus 2 NEGATIVE NEGATIVE Final    Comment: (NOTE) SARS-CoV-2 target nucleic acids are NOT DETECTED. The SARS-CoV-2 RNA is generally detectable in upper and lower respiratory specimens during the acute phase of infection. Negative results do not preclude SARS-CoV-2 infection, do not rule out co-infections with other pathogens, and should not be used as the sole basis for treatment or other patient management decisions. Negative results must be combined with clinical observations, patient history, and epidemiological information. The expected result is Negative. Fact Sheet for Patients: HairSlick.no Fact Sheet  for Healthcare Providers: quierodirigir.com This test is not yet approved or cleared by the [place] and  has been authorized for detection and/or diagnosis of SARS-CoV-2 by FDA under an Emergency Use Authorization (EUA). This EUA will remain  in effect (meaning this test can be used) for the duration of the COVID-19 declaration under Section 56 4(b)(1) of the Act, 21 U.S.C. section 360bbb-3(b)(1), unless the authorization is terminated or revoked sooner. Performed at [facility], [address] [place], [place] [address]          Radiology Studies: Dg Chest  Portable 1 View  Result Date: 06/01/2019 CLINICAL DATA:  Shortness of breath with exertion. EXAM: PORTABLE CHEST 1 VIEW COMPARISON:  None. FINDINGS: Elevation right hemidiaphragm identified. There appears to be a right-sided effusion as well. Suspected cardiomegaly. The hila and mediastinum are unremarkable given portable technique. No pneumothorax. The left lung is clear. IMPRESSION: 1. There is elevation of the right hemidiaphragm. There is a right-sided effusion, possibly sub pulmonic, with underlying opacity, which could represent atelectasis or infiltrate. No other acute abnormalities are identified. Electronically Signed   By: Gerome Sam III M.D   On: 06/01/2019 16:50   Vas [place] Lower Extremity Venous (dvt)  Result Date: 06/02/2019  Lower Venous Study Indications: Edema, and SOB.  Limitations: Body habitus and edema. Comparison Study: No prior study on file for comparison Performing Technologist: Sherren Kerns RVS  Examination Guidelines: A complete evaluation includes B-mode imaging, spectral Doppler, color Doppler, and power Doppler as needed of all accessible portions of each vessel. Bilateral testing is considered an integral part of a complete examination. Limited examinations for reoccurring indications may be performed as noted.  +---------+---------------+---------+-----------+----------+--------------+  RIGHT     Compressibility Phasicity Spontaneity Properties Thrombus Aging  +---------+---------------+---------+-----------+----------+--------------+  CFV       Full            Yes       Yes                                    +---------+---------------+---------+-----------+----------+--------------+  SFJ       Full                                                             +---------+---------------+---------+-----------+----------+--------------+  FV Prox   Full                                                              +---------+---------------+---------+-----------+----------+--------------+  FV Mid    Full                                                             +---------+---------------+---------+-----------+----------+--------------+  FV Distal Full                                                             +---------+---------------+---------+-----------+----------+--------------+  PFV       Full                                                             +---------+---------------+---------+-----------+----------+--------------+  POP       Full            Yes       Yes                                    +---------+---------------+---------+-----------+----------+--------------+  PTV       Full                                                             +---------+---------------+---------+-----------+----------+--------------+  PERO      Full                                                             +---------+---------------+---------+-----------+----------+--------------+   +---------+---------------+---------+-----------+----------+--------------+  LEFT      Compressibility Phasicity Spontaneity Properties Thrombus Aging  +---------+---------------+---------+-----------+----------+--------------+  CFV       Full            Yes       Yes                                    +---------+---------------+---------+-----------+----------+--------------+  SFJ       Full                                                             +---------+---------------+---------+-----------+----------+--------------+  FV Prox   Full                                                             +---------+---------------+---------+-----------+----------+--------------+  FV Mid    Full                                                             +---------+---------------+---------+-----------+----------+--------------+  FV Distal Full                                                              +---------+---------------+---------+-----------+----------+--------------+  PFV       Full                                                             +---------+---------------+---------+-----------+----------+--------------+  POP       Full            Yes       Yes                                    +---------+---------------+---------+-----------+----------+--------------+  PTV       Full                                                             +---------+---------------+---------+-----------+----------+--------------+  PERO      Full                                                             +---------+---------------+---------+-----------+----------+--------------+     Summary: Right: There is no evidence of deep vein thrombosis in the lower extremity. Left: There is no evidence of deep vein thrombosis in the lower extremity.  *See table(s) above for measurements and observations.    Preliminary         Scheduled Meds:  enoxaparin (LOVENOX) injection  80 mg Subcutaneous Q24H   furosemide  80 mg Intravenous BID   ipratropium-albuterol  3 mL Nebulization TID   mometasone-formoterol  2 puff Inhalation BID   Continuous Infusions:   LOS: 2 days        Glade Lloyd, MD [facility] 06/03/2019, 7:14 AM

## 2019-06-04 DIAGNOSIS — I2781 Cor pulmonale (chronic): Secondary | ICD-10-CM

## 2019-06-04 DIAGNOSIS — I4510 Unspecified right bundle-branch block: Secondary | ICD-10-CM

## 2019-06-04 LAB — BASIC METABOLIC PANEL
Anion gap: 11 (ref 5–15)
BUN: 14 mg/dL (ref 8–23)
CO2: 37 mmol/L — ABNORMAL HIGH (ref 22–32)
Calcium: 8.7 mg/dL — ABNORMAL LOW (ref 8.9–10.3)
Chloride: 93 mmol/L — ABNORMAL LOW (ref 98–111)
Creatinine, Ser: 0.92 mg/dL (ref 0.61–1.24)
GFR calc Af Amer: >60 mL/min (ref >60)
GFR calc non Af Amer: >60 mL/min (ref >60)
Glucose, Bld: 104 mg/dL — ABNORMAL HIGH (ref 70–99)
Potassium: 3.5 mmol/L (ref 3.5–5.1)
Sodium: 141 mmol/L (ref 135–145)

## 2019-06-04 LAB — TSH: TSH: 1.966 u[IU]/mL (ref 0.350–4.500)

## 2019-06-04 LAB — GLUCOSE, CAPILLARY: Glucose-Capillary: 98 mg/dL (ref 70–99)

## 2019-06-04 LAB — MAGNESIUM: Magnesium: 2 mg/dL (ref 1.7–2.4)

## 2019-06-04 MED ORDER — DEXTROSE 5 % IV SOLN
500.0000 mg | Freq: Once | INTRAVENOUS | Status: DC
  Filled 2019-06-04: qty 500

## 2019-06-04 MED ORDER — SODIUM CHLORIDE 0.9% FLUSH
3.0000 mL | Freq: Two times a day (BID) | INTRAVENOUS | Status: DC
  Administered 2019-06-04 – 2019-06-07 (×5): 3 mL via INTRAVENOUS

## 2019-06-04 MED ORDER — ASPIRIN 81 MG PO CHEW
81.0000 mg | CHEWABLE_TABLET | ORAL | Status: AC
  Administered 2019-06-05: 81 mg via ORAL
  Filled 2019-06-04: qty 1

## 2019-06-04 MED ORDER — SODIUM CHLORIDE 0.9 % IV SOLN: 250.0000 mL | INTRAVENOUS | Status: DC | PRN

## 2019-06-04 MED ORDER — IPRATROPIUM-ALBUTEROL 0.5-2.5 (3) MG/3ML IN SOLN
3.0000 mL | Freq: Two times a day (BID) | RESPIRATORY_TRACT | Status: DC
  Administered 2019-06-04 – 2019-06-07 (×4): 3 mL via RESPIRATORY_TRACT
  Filled 2019-06-04 (×6): qty 3

## 2019-06-04 MED ORDER — SODIUM CHLORIDE 0.9 % IV SOLN
INTRAVENOUS | Status: DC
  Administered 2019-06-05: 06:00:00 via INTRAVENOUS

## 2019-06-04 MED ORDER — SODIUM CHLORIDE 0.9% FLUSH: 3.0000 mL | INTRAVENOUS | Status: DC | PRN

## 2019-06-04 MED ORDER — ACETAZOLAMIDE SODIUM 500 MG IJ SOLR
500.0000 mg | Freq: Once | INTRAMUSCULAR | Status: AC
  Administered 2019-06-04: 500 mg via INTRAVENOUS
  Filled 2019-06-04: qty 500

## 2019-06-04 NOTE — H&P (View-Only) (Signed)
 Progress Note  Patient Name: Justin Rasmussen Date of Encounter: 06/04/2019  Primary Cardiologist: New (Marabella Popiel)  Subjective   Denies angina. No longer has orthopnea. Excellent diuresis, still has mild ankle and pretibial edema (1-2+). Mildly hypotensive (asymptomatic). Net diuresis 11.5L in 2 days. Weight down >13 kg. Markedly alkalotic despite respiratory acidosis. Normal renal function. Echo shows EF 30-35% and regional wall motion abnormalities. QS in V1-V2 on ECG. RBBB.  Inpatient Medications    Scheduled Meds: . acetaZOLAMIDE  500 mg Intravenous Once  . aspirin  81 mg Oral Daily  . enoxaparin (LOVENOX) injection  80 mg Subcutaneous Q24H  . furosemide  80 mg Intravenous BID  . ipratropium-albuterol  3 mL Nebulization BID  . losartan  50 mg Oral Daily  . mometasone-formoterol  2 puff Inhalation BID   Continuous Infusions: . chlorothiazide (DIURIL) IV     PRN Meds: albuterol   Vital Signs    Vitals:   06/04/19 0727 06/04/19 0729 06/04/19 0905 06/04/19 0915  BP:   94/60 98/66  Pulse:   93   Resp:   18   Temp:   98 F (36.7 C)   TempSrc:   Oral   SpO2: 97% 97% 92%   Weight:      Height:        Intake/Output Summary (Last 24 hours) at 06/04/2019 1018 Last data filed at 06/04/2019 0500 Gross per 24 hour  Intake 462 ml  Output 4950 ml  Net -4488 ml   Last 3 Weights 06/04/2019 06/03/2019 06/02/2019  Weight (lbs) 307 lb 3.2 oz 319 lb 6.4 oz 336 lb  Weight (kg) 139.345 kg 144.879 kg 152.409 kg      Telemetry    SR w incessant PACs, couplets, no true atrial fibrillation, also occ PVCs  - Personally Reviewed  ECG    SSR w PACs, RBBB, anteroseptal Q waves - Personally Reviewed  Physical Exam  Morbidly obese GEN: No acute distress.   Neck: cannot see JVD. Broad neck girth. Cardiac: RRR w ectopy, no murmurs, rubs, or gallops.  Respiratory: Clear to auscultation bilaterally. GI: Soft, nontender, non-distended  MS: 1-2+ pretibial edema, starting to show  some wrinkles edema; No deformity. Neuro:  Nonfocal  Psych: Normal affect   Labs    High Sensitivity Troponin:   Recent Labs  Lab 06/03/19 0937  TROPONINIHS 38*      Chemistry Recent Labs  Lab 06/01/19 1630  06/02/19 1050 06/03/19 0514 06/04/19 0709  NA 142   < > 141 145 141  K 3.6   < > 3.8 3.5 3.5  CL 96*   < > 96* 91* 93*  CO2 35*   < > 38* 44* 37*  GLUCOSE 164*   < > 121* 109* 104*  BUN 18   < > 15 12 14  CREATININE 0.92   < > 0.89 0.97 0.92  CALCIUM 8.8*   < > 8.3* 8.6* 8.7*  PROT 6.4*  --   --  5.7*  --   ALBUMIN 3.9  --   --  3.5  --   AST 24  --   --  24  --   ALT 28  --   --  25  --   ALKPHOS 64  --   --  54  --   BILITOT 0.8  --   --  1.2  --   GFRNONAA >60   < > >60 >60 >60  GFRAA >60   < > >60 >60 >  60  ANIONGAP 11   < > 7 10 11    < > = values in this interval not displayed.     Hematology Recent Labs  Lab 06/01/19 1630 06/02/19 0000 06/03/19 0514  WBC 9.4 9.7 7.6  RBC 5.33 5.14 4.94  HGB 15.0 15.2 14.6  HCT 50.4 48.3 46.5  MCV 94.6 94.0 94.1  MCH 28.1 29.6 29.6  MCHC 29.8* 31.5 31.4  RDW 14.4 14.4 14.4  PLT 189 167 150    BNP Recent Labs  Lab 06/01/19 1630  BNP 322.5*     DDimer No results for input(s): DDIMER in the last 168 hours.   Radiology    Vas 06/03/19 Lower Extremity Venous (dvt)  Result Date: 06/03/2019  Lower Venous Study Indications: Edema, and SOB.  Limitations: Body habitus and edema. Comparison Study: No prior study on file for comparison Performing Technologist: 06/05/2019 RVS  Examination Guidelines: A complete evaluation includes B-mode imaging, spectral Doppler, color Doppler, and power Doppler as needed of all accessible portions of each vessel. Bilateral testing is considered an integral part of a complete examination. Limited examinations for reoccurring indications may be performed as noted.  +---------+---------------+---------+-----------+----------+--------------+ RIGHT     CompressibilityPhasicitySpontaneityPropertiesThrombus Aging +---------+---------------+---------+-----------+----------+--------------+ CFV      Full           Yes      Yes                                 +---------+---------------+---------+-----------+----------+--------------+ SFJ      Full                                                        +---------+---------------+---------+-----------+----------+--------------+ FV Prox  Full                                                        +---------+---------------+---------+-----------+----------+--------------+ FV Mid   Full                                                        +---------+---------------+---------+-----------+----------+--------------+ FV DistalFull                                                        +---------+---------------+---------+-----------+----------+--------------+ PFV      Full                                                        +---------+---------------+---------+-----------+----------+--------------+ POP      Full           Yes      Yes                                 +---------+---------------+---------+-----------+----------+--------------+  PTV      Full                                                        +---------+---------------+---------+-----------+----------+--------------+ PERO     Full                                                        +---------+---------------+---------+-----------+----------+--------------+   +---------+---------------+---------+-----------+----------+--------------+ LEFT     CompressibilityPhasicitySpontaneityPropertiesThrombus Aging +---------+---------------+---------+-----------+----------+--------------+ CFV      Full           Yes      Yes                                 +---------+---------------+---------+-----------+----------+--------------+ SFJ      Full                                                         +---------+---------------+---------+-----------+----------+--------------+ FV Prox  Full                                                        +---------+---------------+---------+-----------+----------+--------------+ FV Mid   Full                                                        +---------+---------------+---------+-----------+----------+--------------+ FV DistalFull                                                        +---------+---------------+---------+-----------+----------+--------------+ PFV      Full                                                        +---------+---------------+---------+-----------+----------+--------------+ POP      Full           Yes      Yes                                 +---------+---------------+---------+-----------+----------+--------------+ PTV      Full                                                        +---------+---------------+---------+-----------+----------+--------------+   PERO     Full                                                        +---------+---------------+---------+-----------+----------+--------------+     Summary: Right: There is no evidence of deep vein thrombosis in the lower extremity. Left: There is no evidence of deep vein thrombosis in the lower extremity.  *See table(s) above for measurements and observations. Electronically signed by Curt Jews MD on 06/03/2019 at 9:51:00 AM.    Final     Cardiac Studies  ECHO 06/02/2019 1. Left ventricular ejection fraction, by visual estimation, is 30 to 35%. The left ventricle has severely decreased function. Left ventricular septal wall thickness was moderately increased. There is no left ventricular hypertrophy.  2. Entire lateral wall, entire anterior wall, and apex are abnormal.  3. Elevated mean left atrial pressure.  4. Left ventricular diastolic Doppler parameters are consistent with pseudonormalization pattern of LV  diastolic filling.  5. Global right ventricle has mildly reduced systolic function.The right ventricular size is severely enlarged. No increase in right ventricular wall thickness.  6. Left atrial size was moderately dilated.  7. Right atrial size was moderately dilated.  8. Mild mitral annular calcification.  9. The mitral valve is normal in structure. Trace mitral valve regurgitation. 10. The tricuspid valve is normal in structure. Tricuspid valve regurgitation is trivial. 11. The aortic valve is tricuspid Aortic valve regurgitation was not visualized by color flow Doppler. Mild aortic valve sclerosis without stenosis. 12. The pulmonic valve was not well visualized. Pulmonic valve regurgitation is not visualized by color flow Doppler. 13. Moderately elevated pulmonary artery systolic pressure. 14. The inferior vena cava is dilated in size with <50% respiratory variability, suggesting right atrial pressure of 15 mmHg.  Patient Profile     63 y.o. male w morbid obesity and acute biventricular failure with reduced LVEF (30-35% w hypokinesis in LAD distribution) and reduced RV systolic function. Hgb  A1c c/w "prediabetes".  Assessment & Plan    1. CHF: findings c/w ischemic CMP, previous LAD artery territory infarction. Needs coronary angiography. May benefit from PCI or CABG, but may need a viability study. Scheduled for R and L heart cath and possible PCI stent w Dr. Irish Lack at [phone] tomorrow. This procedure has been fully reviewed with the patient and written informed consent has been obtained. Currently hypotensive due to very fast diuresis. Stop thiazide. Hold furosemide until BP recovers. OK to give acetazolamide. Resume ARB once BP recovers, but hold ARB pre-cath in AM. 2. Acute on chronic cor pulmonale: high suspicion for chronic cor pulmonale due to OSA and obesity-hypoventilation sd. Note marked respiratory acidosis with metabolic compensation (and overshoot due to aggressive diuresis).  3. Morbid obesity/preDM 4. Suspected CAD: check lipids.      For questions or updates, please contact [place] Please consult [email or web address] for contact info under        Signed, Sanda Klein, MD  06/04/2019, 10:18 AM

## 2019-06-04 NOTE — Progress Notes (Signed)
NAMEColtin Rasmussen, MRN:  [id number], DOB:  1955-11-29, LOS: 3 ADMISSION DATE:  06/01/2019, CONSULTATION DATE:  9/27 REFERRING MD:  Graciela Husbands , CHIEF COMPLAINT:  Chronic respiratory failure   Brief History   Obese 63 year old male smoker admitted on 9/25 with newly identified systolic cardiomyopathy and related volume overload.  On further evaluation felt likely to have a complicating component of obstructive sleep apnea and possibly chronic hypoxic respiratory failure because of this pulmonary asked to assist with care   Past Medical History  Morbid obesity with BMI of 48, h/o pleuritic chest pain several years ago     Significant Hospital Events   9/25 admitted with working diagnosis of decompensated heart failure 9/27: Cardiology consulted.  EF 30 to 35% with decreased biventricular function.  Arterial blood gas shows pH of 7.48, PCO2 of 62, PO2 of 66 , Bicarbonate of 45 saturations 93.5%.9/26: Currently 8.488 L negative  Consults:  Pulmonary  Procedures:    Significant Diagnostic Tests:  Echocardiogram: Left ventricular fraction 30 to 35% with severely decreased LV function moderately increased septal wall thickening.  Elevated left atrial pressure.  Global right ventricular reduced systolic function with severe right Ventricular hypertrophy right atrium mild to moderately dilated left atrium moderately dilated increased pulmonary artery systolic pressures  Micro Data:    Antimicrobials:    Interim history/subjective:  Feeling better   Objective   Blood pressure 98/66, pulse 93, temperature 98 F (36.7 C), temperature source Oral, resp. rate 18, height 5\' 9"  (1.753 m), weight (Abnormal) 139.3 kg, SpO2 92 %.    FiO2 (%):  [32 %] 32 %   Intake/Output Summary (Last 24 hours) at 06/04/2019 0941 Last data filed at 06/04/2019 0500 Gross per 24 hour  Intake 462 ml  Output 4950 ml  Net -4488 ml   Filed Weights   06/02/19 0453 06/03/19 0515 06/04/19 0438  Weight: (Abnormal)  152.4 kg (Abnormal) 144.9 kg (Abnormal) 139.3 kg    Examination: General this is a pleasant obese white male he sitting up in bed and he is in no acute distress today HEENT normocephalic atraumatic no jugular venous distention is appreciated mucous membranes are moist Pulmonary diminished bases no accessory use Cardiac regular rate and rhythm Abdomen soft nontender Extremities chronic venous stasis changes with lower extremity edema Neuro awake oriented no focal deficits Psych little anxious today his blood pressures been low in the 90s he is worried about taking his blood pressure medications  Resolved Hospital Problem list     Assessment & Plan:   Acute on chronic hypoxic respiratory failure in the setting of volume overload, and right lower lobe volume loss with probable element of effusion and atelectasis Newly diagnosed biventricular heart failure with EF 30 to 35% Pulmonary edema and pleural effusion Probable obstructive sleep apnea Possible obesity hypoventilation syndrome Elevated right hemidiaphragm (of unknown chronicity)   Discussion Suspect he's had chronic respiratory failure for some time on the basis of obesity, and probable OSA, OHS +/- element of obstructive lung disease. Also has elevated right HD of unclear chronicity. He feels better.  Best approach at this point given his limited financial resources is to push diuresis get him euvolemic and check walking oximetry prior to discharge I suspect he will go home on oxygen either at night or with activity we will need to further evaluate for sleep apnea and underlying obstructive lung disease as an outpatient.  He is planning on going on his wife's insurance once enrollment starts in January.  This  should allow [place] to help further  Plan Continue aggressive diuresis  We will repeat Diamox and Diuril today  Continue to wean oxygen for saturations greater than 90 I suspect he will need home oxygen  Social work and case  management have been consulted to assist with home oxygen requirements  Our office will send him a letter once the scheduling for January has been opened I have instructed him to call the office as soon as he gets letter we will have him follow with Dr. Craige Cotta  Once we get him established in clinic he will need pulmonary function testing to assess if he indeed has COPD, a polysomnogram to assess for sleep apnea and eventually CT of chest to better evaluate elevated right hemidiaphragm        Simonne Martinet ACNP-BC [facility] Pulmonary/Critical Care Pager # [phone] OR # [phone] if no answer

## 2019-06-04 NOTE — Progress Notes (Signed)
Patient ID: Justin Rasmussen, male   DOB: [birth year], 63 y.o.   MRN: [id number]  PROGRESS NOTE    Justin Rasmussen  NWG:[id number] DOB: 01-30-1956 DOA: 06/01/2019 PCP: Patient, No Pcp Per   Brief Narrative:  63 year old male with unknown medical history presented with worsening shortness of breath along with lower extremity edema on 06/01/2019.  He was found to be hypoxic on presentation and chest x-ray showed right-sided effusion with underlying opacity representing atelectasis versus infiltrate.  He was started on intravenous Lasix.  Assessment & Plan:   Acute hypoxic respiratory failure Probable acute systolic and diastolic CHF COPD, doubt that patient has exacerbation Undiagnosed probable obstructive sleep apnea versus obesity hypoventilation syndrome -On presentation, patient required 4 L oxygen.  Does not wear oxygen at home. -Currently still requiring oxygen via nasal cannula at 3L/min.  Wean off as able. -Continue Lasix  80 mg IV every 12 hours.  lower extremity duplex ultrasound was negative for DVT.  Strict input and output.  Daily weights.  Negative balance of 11,573 cc since admission.  Fluid restriction.  Echo shows EF of 30 to 35%.  Cardiology evaluation appreciated.  Probable cardiac cath today or tomorrow.  Continue losartan. -Continue nebs and Dulera.  Not wheezing currently, no need for steroids at this time.  Pulmonary evaluation appreciated.  Morbid obesity  -Outpatient follow-up.  Counseled about lifestyle changes and weight loss    DVT prophylaxis: Lovenox Code Status: Full Family Communication: None at bedside Disposition Plan: Home in 2 to 3 days once clinically improved once cleared by cardiology  Consultants: None  Procedures:  Echo IMPRESSIONS    1. Left ventricular ejection fraction, by visual estimation, is 30 to 35%. The left ventricle has severely decreased function. Left ventricular septal wall thickness was moderately increased. There is no left  ventricular hypertrophy.  2. Entire lateral wall, entire anterior wall, and apex are abnormal.  3. Elevated mean left atrial pressure.  4. Left ventricular diastolic Doppler parameters are consistent with pseudonormalization pattern of LV diastolic filling.  5. Global right ventricle has mildly reduced systolic function.The right ventricular size is severely enlarged. No increase in right ventricular wall thickness.  6. Left atrial size was moderately dilated.  7. Right atrial size was moderately dilated.  8. Mild mitral annular calcification.  9. The mitral valve is normal in structure. Trace mitral valve regurgitation. 10. The tricuspid valve is normal in structure. Tricuspid valve regurgitation is trivial. 11. The aortic valve is tricuspid Aortic valve regurgitation was not visualized by color flow Doppler. Mild aortic valve sclerosis without stenosis. 12. The pulmonic valve was not well visualized. Pulmonic valve regurgitation is not visualized by color flow Doppler. 13. Moderately elevated pulmonary artery systolic pressure. 14. The inferior vena cava is dilated in size with <50% respiratory variability, suggesting right atrial pressure of 15 mmHg.  Antimicrobials: None   Subjective: Patient seen and examined at bedside.  No overnight fever or vomiting.  No worsening shortness of breath, chest pain.  objective: Vitals:   06/04/19 0008 06/04/19 0438 06/04/19 0727 06/04/19 0729  BP: (!) 118/58 110/81    Pulse: 85 100    Resp: 20 20    Temp: 99 F (37.2 C) 98.5 F (36.9 C)    TempSrc: Oral Oral    SpO2: 96% 95% 97% 97%  Weight:  (!) 139.3 kg    Height:        Intake/Output Summary (Last 24 hours) at 06/04/2019 0732 Last data filed at 06/04/2019 0500 Gross per  24 hour  Intake 702 ml  Output 5300 ml  Net -4598 ml   Filed Weights   06/02/19 0453 06/03/19 0515 06/04/19 0438  Weight: (!) 152.4 kg (!) 144.9 kg (!) 139.3 kg    Examination:  General exam: No distress.  Respiratory system: Bilateral decreased breath sounds at bases, no wheezing.  Bibasilar crackles present.   Cardiovascular system: S1-S2 heard, rate controlled Gastrointestinal system: Abdomen is morbidly obese, nondistended, soft and nontender. Normal bowel sounds heard. Extremities: No cyanosis; bilateral lower extremity 2-3+ edema   Data Reviewed: I have personally reviewed following labs and imaging studies  CBC: Recent Labs  Lab 06/01/19 1630 06/02/19 0000 06/03/19 0514  WBC 9.4 9.7 7.6  NEUTROABS 7.2  --  5.8  HGB 15.0 15.2 14.6  HCT 50.4 48.3 46.5  MCV 94.6 94.0 94.1  PLT 189 167 150   Basic Metabolic Panel: Recent Labs  Lab 06/01/19 1630 06/02/19 0000 06/02/19 1050 06/03/19 0514  NA 142 142 141 145  K 3.6 3.6 3.8 3.5  CL 96* 95* 96* 91*  CO2 35* 38* 38* 44*  GLUCOSE 164* 130* 121* 109*  BUN 18 15 15 12   CREATININE 0.92 0.97 0.89 0.97  CALCIUM 8.8* 8.5* 8.3* 8.6*  MG  --   --  2.1 2.0   GFR: Estimated Creatinine Clearance: 111 mL/min (by C-G formula based on SCr of 0.97 mg/dL). Liver Function Tests: Recent Labs  Lab 06/01/19 1630 06/03/19 0514  AST 24 24  ALT 28 25  ALKPHOS 64 54  BILITOT 0.8 1.2  PROT 6.4* 5.7*  ALBUMIN 3.9 3.5   No results for input(s): LIPASE, AMYLASE in the last 168 hours. No results for input(s): AMMONIA in the last 168 hours. Coagulation Profile: No results for input(s): INR, PROTIME in the last 168 hours. Cardiac Enzymes: No results for input(s): CKTOTAL, CKMB, CKMBINDEX, TROPONINI in the last 168 hours. BNP (last 3 results) No results for input(s): PROBNP in the last 8760 hours. HbA1C: Recent Labs    06/02/19 0344  HGBA1C 6.0*   CBG: No results for input(s): GLUCAP in the last 168 hours. Lipid Profile: No results for input(s): CHOL, HDL, LDLCALC, TRIG, CHOLHDL, LDLDIRECT in the last 72 hours. Thyroid Function Tests: No results for input(s): TSH, T4TOTAL, FREET4, T3FREE, THYROIDAB in the last 72 hours. Anemia  Panel: No results for input(s): VITAMINB12, FOLATE, FERRITIN, TIBC, IRON, RETICCTPCT in the last 72 hours. Sepsis Labs: No results for input(s): PROCALCITON, LATICACIDVEN in the last 168 hours.  Recent Results (from the past 240 hour(s))  SARS CORONAVIRUS 2 (TAT 6-24 HRS) Nasopharyngeal Nasopharyngeal Swab     Status: None   Collection Time: 06/01/19  5:45 PM   Specimen: Nasopharyngeal Swab  Result Value Ref Range Status   SARS Coronavirus 2 NEGATIVE NEGATIVE Final    Comment: (NOTE) SARS-CoV-2 target nucleic acids are NOT DETECTED. The SARS-CoV-2 RNA is generally detectable in upper and lower respiratory specimens during the acute phase of infection. Negative results do not preclude SARS-CoV-2 infection, do not rule out co-infections with other pathogens, and should not be used as the sole basis for treatment or other patient management decisions. Negative results must be combined with clinical observations, patient history, and epidemiological information. The expected result is Negative. Fact Sheet for Patients: HairSlick.no Fact Sheet for Healthcare Providers: quierodirigir.com This test is not yet approved or cleared by the [place] FDA and  has been authorized for detection and/or diagnosis of SARS-CoV-2 by FDA under an Emergency Use  Authorization (EUA). This EUA will remain  in effect (meaning this test can be used) for the duration of the COVID-19 declaration under Section 56 4(b)(1) of the Act, 21 U.S.C. section 360bbb-3(b)(1), unless the authorization is terminated or revoked sooner. Performed at [facility], [address], [place], [place] [address]          Radiology Studies: Vas [place] Lower Extremity Venous (dvt)  Result Date: 06/03/2019  Lower Venous Study Indications: Edema, and SOB.  Limitations: Body habitus and edema. Comparison Study: No prior study on file for comparison Performing  Technologist: Sherren Kerns RVS  Examination Guidelines: A complete evaluation includes B-mode imaging, spectral Doppler, color Doppler, and power Doppler as needed of all accessible portions of each vessel. Bilateral testing is considered an integral part of a complete examination. Limited examinations for reoccurring indications may be performed as noted.  +---------+---------------+---------+-----------+----------+--------------+ RIGHT    CompressibilityPhasicitySpontaneityPropertiesThrombus Aging +---------+---------------+---------+-----------+----------+--------------+ CFV      Full           Yes      Yes                                 +---------+---------------+---------+-----------+----------+--------------+ SFJ      Full                                                        +---------+---------------+---------+-----------+----------+--------------+ FV Prox  Full                                                        +---------+---------------+---------+-----------+----------+--------------+ FV Mid   Full                                                        +---------+---------------+---------+-----------+----------+--------------+ FV DistalFull                                                        +---------+---------------+---------+-----------+----------+--------------+ PFV      Full                                                        +---------+---------------+---------+-----------+----------+--------------+ POP      Full           Yes      Yes                                 +---------+---------------+---------+-----------+----------+--------------+ PTV      Full                                                        +---------+---------------+---------+-----------+----------+--------------+  PERO     Full                                                         +---------+---------------+---------+-----------+----------+--------------+   +---------+---------------+---------+-----------+----------+--------------+ LEFT     CompressibilityPhasicitySpontaneityPropertiesThrombus Aging +---------+---------------+---------+-----------+----------+--------------+ CFV      Full           Yes      Yes                                 +---------+---------------+---------+-----------+----------+--------------+ SFJ      Full                                                        +---------+---------------+---------+-----------+----------+--------------+ FV Prox  Full                                                        +---------+---------------+---------+-----------+----------+--------------+ FV Mid   Full                                                        +---------+---------------+---------+-----------+----------+--------------+ FV DistalFull                                                        +---------+---------------+---------+-----------+----------+--------------+ PFV      Full                                                        +---------+---------------+---------+-----------+----------+--------------+ POP      Full           Yes      Yes                                 +---------+---------------+---------+-----------+----------+--------------+ PTV      Full                                                        +---------+---------------+---------+-----------+----------+--------------+ PERO     Full                                                        +---------+---------------+---------+-----------+----------+--------------+  Summary: Right: There is no evidence of deep vein thrombosis in the lower extremity. Left: There is no evidence of deep vein thrombosis in the lower extremity.  *See table(s) above for measurements and observations. Electronically signed by Gretta Began MD on 06/03/2019 at  9:51:00 AM.    Final         Scheduled Meds: . aspirin  81 mg Oral Daily  . enoxaparin (LOVENOX) injection  80 mg Subcutaneous Q24H  . furosemide  80 mg Intravenous BID  . ipratropium-albuterol  3 mL Nebulization BID  . losartan  50 mg Oral Daily  . mometasone-formoterol  2 puff Inhalation BID   Continuous Infusions:   LOS: 3 days        Glade Lloyd, MD [facility] 06/04/2019, 7:32 AM

## 2019-06-04 NOTE — Progress Notes (Signed)
Progress Note  Patient Name: Justin Rasmussen Date of Encounter: 06/04/2019  Primary Cardiologist: New (Julie-Anne Torain)  Subjective   Denies angina. No longer has orthopnea. Excellent diuresis, still has mild ankle and pretibial edema (1-2+). Mildly hypotensive (asymptomatic). Net diuresis 11.5L in 2 days. Weight down >13 kg. Markedly alkalotic despite respiratory acidosis. Normal renal function. Echo shows EF 30-35% and regional wall motion abnormalities. QS in V1-V2 on ECG. RBBB.  Inpatient Medications    Scheduled Meds: . acetaZOLAMIDE  500 mg Intravenous Once  . aspirin  81 mg Oral Daily  . enoxaparin (LOVENOX) injection  80 mg Subcutaneous Q24H  . furosemide  80 mg Intravenous BID  . ipratropium-albuterol  3 mL Nebulization BID  . losartan  50 mg Oral Daily  . mometasone-formoterol  2 puff Inhalation BID   Continuous Infusions: . chlorothiazide (DIURIL) IV     PRN Meds: albuterol   Vital Signs    Vitals:   06/04/19 0727 06/04/19 0729 06/04/19 0905 06/04/19 0915  BP:   94/60 98/66  Pulse:   93   Resp:   18   Temp:   98 F (36.7 C)   TempSrc:   Oral   SpO2: 97% 97% 92%   Weight:      Height:        Intake/Output Summary (Last 24 hours) at 06/04/2019 1018 Last data filed at 06/04/2019 0500 Gross per 24 hour  Intake 462 ml  Output 4950 ml  Net -4488 ml   Last 3 Weights 06/04/2019 06/03/2019 06/02/2019  Weight (lbs) 307 lb 3.2 oz 319 lb 6.4 oz 336 lb  Weight (kg) 139.345 kg 144.879 kg 152.409 kg      Telemetry    SR w incessant PACs, couplets, no true atrial fibrillation, also occ PVCs  - Personally Reviewed  ECG    SSR w PACs, RBBB, anteroseptal Q waves - Personally Reviewed  Physical Exam  Morbidly obese GEN: No acute distress.   Neck: cannot see JVD. Broad neck girth. Cardiac: RRR w ectopy, no murmurs, rubs, or gallops.  Respiratory: Clear to auscultation bilaterally. GI: Soft, nontender, non-distended  MS: 1-2+ pretibial edema, starting to show  some wrinkles edema; No deformity. Neuro:  Nonfocal  Psych: Normal affect   Labs    High Sensitivity Troponin:   Recent Labs  Lab 06/03/19 0937  TROPONINIHS 38*      Chemistry Recent Labs  Lab 06/01/19 1630  06/02/19 1050 06/03/19 0514 06/04/19 0709  NA 142   < > 141 145 141  K 3.6   < > 3.8 3.5 3.5  CL 96*   < > 96* 91* 93*  CO2 35*   < > 38* 44* 37*  GLUCOSE 164*   < > 121* 109* 104*  BUN 18   < > 15 12 14   CREATININE 0.92   < > 0.89 0.97 0.92  CALCIUM 8.8*   < > 8.3* 8.6* 8.7*  PROT 6.4*  --   --  5.7*  --   ALBUMIN 3.9  --   --  3.5  --   AST 24  --   --  24  --   ALT 28  --   --  25  --   ALKPHOS 64  --   --  54  --   BILITOT 0.8  --   --  1.2  --   GFRNONAA >60   < > >60 >60 >60  GFRAA >60   < > >60 >60 >  60  ANIONGAP 11   < > 7 10 11    < > = values in this interval not displayed.     Hematology Recent Labs  Lab 06/01/19 1630 06/02/19 0000 06/03/19 0514  WBC 9.4 9.7 7.6  RBC 5.33 5.14 4.94  HGB 15.0 15.2 14.6  HCT 50.4 48.3 46.5  MCV 94.6 94.0 94.1  MCH 28.1 29.6 29.6  MCHC 29.8* 31.5 31.4  RDW 14.4 14.4 14.4  PLT 189 167 150    BNP Recent Labs  Lab 06/01/19 1630  BNP 322.5*     DDimer No results for input(s): DDIMER in the last 168 hours.   Radiology    Vas 06/03/19 Lower Extremity Venous (dvt)  Result Date: 06/03/2019  Lower Venous Study Indications: Edema, and SOB.  Limitations: Body habitus and edema. Comparison Study: No prior study on file for comparison Performing Technologist: 06/05/2019 RVS  Examination Guidelines: A complete evaluation includes B-mode imaging, spectral Doppler, color Doppler, and power Doppler as needed of all accessible portions of each vessel. Bilateral testing is considered an integral part of a complete examination. Limited examinations for reoccurring indications may be performed as noted.  +---------+---------------+---------+-----------+----------+--------------+ RIGHT     CompressibilityPhasicitySpontaneityPropertiesThrombus Aging +---------+---------------+---------+-----------+----------+--------------+ CFV      Full           Yes      Yes                                 +---------+---------------+---------+-----------+----------+--------------+ SFJ      Full                                                        +---------+---------------+---------+-----------+----------+--------------+ FV Prox  Full                                                        +---------+---------------+---------+-----------+----------+--------------+ FV Mid   Full                                                        +---------+---------------+---------+-----------+----------+--------------+ FV DistalFull                                                        +---------+---------------+---------+-----------+----------+--------------+ PFV      Full                                                        +---------+---------------+---------+-----------+----------+--------------+ POP      Full           Yes      Yes                                 +---------+---------------+---------+-----------+----------+--------------+  PTV      Full                                                        +---------+---------------+---------+-----------+----------+--------------+ PERO     Full                                                        +---------+---------------+---------+-----------+----------+--------------+   +---------+---------------+---------+-----------+----------+--------------+ LEFT     CompressibilityPhasicitySpontaneityPropertiesThrombus Aging +---------+---------------+---------+-----------+----------+--------------+ CFV      Full           Yes      Yes                                 +---------+---------------+---------+-----------+----------+--------------+ SFJ      Full                                                         +---------+---------------+---------+-----------+----------+--------------+ FV Prox  Full                                                        +---------+---------------+---------+-----------+----------+--------------+ FV Mid   Full                                                        +---------+---------------+---------+-----------+----------+--------------+ FV DistalFull                                                        +---------+---------------+---------+-----------+----------+--------------+ PFV      Full                                                        +---------+---------------+---------+-----------+----------+--------------+ POP      Full           Yes      Yes                                 +---------+---------------+---------+-----------+----------+--------------+ PTV      Full                                                        +---------+---------------+---------+-----------+----------+--------------+   PERO     Full                                                        +---------+---------------+---------+-----------+----------+--------------+     Summary: Right: There is no evidence of deep vein thrombosis in the lower extremity. Left: There is no evidence of deep vein thrombosis in the lower extremity.  *See table(s) above for measurements and observations. Electronically signed by Curt Jews MD on 06/03/2019 at 9:51:00 AM.    Final     Cardiac Studies  ECHO 06/02/2019 1. Left ventricular ejection fraction, by visual estimation, is 30 to 35%. The left ventricle has severely decreased function. Left ventricular septal wall thickness was moderately increased. There is no left ventricular hypertrophy.  2. Entire lateral wall, entire anterior wall, and apex are abnormal.  3. Elevated mean left atrial pressure.  4. Left ventricular diastolic Doppler parameters are consistent with pseudonormalization pattern of LV  diastolic filling.  5. Global right ventricle has mildly reduced systolic function.The right ventricular size is severely enlarged. No increase in right ventricular wall thickness.  6. Left atrial size was moderately dilated.  7. Right atrial size was moderately dilated.  8. Mild mitral annular calcification.  9. The mitral valve is normal in structure. Trace mitral valve regurgitation. 10. The tricuspid valve is normal in structure. Tricuspid valve regurgitation is trivial. 11. The aortic valve is tricuspid Aortic valve regurgitation was not visualized by color flow Doppler. Mild aortic valve sclerosis without stenosis. 12. The pulmonic valve was not well visualized. Pulmonic valve regurgitation is not visualized by color flow Doppler. 13. Moderately elevated pulmonary artery systolic pressure. 14. The inferior vena cava is dilated in size with <50% respiratory variability, suggesting right atrial pressure of 15 mmHg.  Patient Profile     63 y.o. male w morbid obesity and acute biventricular failure with reduced LVEF (30-35% w hypokinesis in LAD distribution) and reduced RV systolic function. Hgb  A1c c/w "prediabetes".  Assessment & Plan    1. CHF: findings c/w ischemic CMP, previous LAD artery territory infarction. Needs coronary angiography. May benefit from PCI or CABG, but may need a viability study. Scheduled for R and L heart cath and possible PCI stent w Dr. Irish Lack at [phone] tomorrow. This procedure has been fully reviewed with the patient and written informed consent has been obtained. Currently hypotensive due to very fast diuresis. Stop thiazide. Hold furosemide until BP recovers. OK to give acetazolamide. Resume ARB once BP recovers, but hold ARB pre-cath in AM. 2. Acute on chronic cor pulmonale: high suspicion for chronic cor pulmonale due to OSA and obesity-hypoventilation sd. Note marked respiratory acidosis with metabolic compensation (and overshoot due to aggressive diuresis).  3. Morbid obesity/preDM 4. Suspected CAD: check lipids.      For questions or updates, please contact [place] Please consult [email or web address] for contact info under        Signed, Sanda Klein, MD  06/04/2019, 10:18 AM

## 2019-06-04 NOTE — Discharge Instructions (Signed)
You will get a letter from [facility] once the schedule opens up for January.  Please call as soon as you get this letter. You will be seen by Dr Halford Chessman.

## 2019-06-05 ENCOUNTER — Ambulatory Visit: Payer: Self-pay | Admitting: Medical

## 2019-06-05 ENCOUNTER — Inpatient Hospital Stay (HOSPITAL_COMMUNITY): Payer: Self-pay

## 2019-06-05 ENCOUNTER — Inpatient Hospital Stay (HOSPITAL_COMMUNITY)
Admission: EM | Disposition: A | Discharge status: Home / Self Care | Payer: Self-pay | Source: Home / Self Care | Attending: Internal Medicine

## 2019-06-05 ENCOUNTER — Encounter (HOSPITAL_COMMUNITY): Payer: Self-pay | Admitting: Interventional Cardiology

## 2019-06-05 DIAGNOSIS — I5021 Acute systolic (congestive) heart failure: Secondary | ICD-10-CM

## 2019-06-05 DIAGNOSIS — I428 Other cardiomyopathies: Secondary | ICD-10-CM

## 2019-06-05 HISTORY — PX: RIGHT/LEFT HEART CATH AND CORONARY ANGIOGRAPHY: CATH118266

## 2019-06-05 LAB — BASIC METABOLIC PANEL
Anion gap: 9 (ref 5–15)
BUN: 18 mg/dL (ref 8–23)
CO2: 36 mmol/L — ABNORMAL HIGH (ref 22–32)
Calcium: 8.6 mg/dL — ABNORMAL LOW (ref 8.9–10.3)
Chloride: 95 mmol/L — ABNORMAL LOW (ref 98–111)
Creatinine, Ser: 1.04 mg/dL (ref 0.61–1.24)
GFR calc Af Amer: >60 mL/min (ref >60)
GFR calc non Af Amer: >60 mL/min (ref >60)
Glucose, Bld: 109 mg/dL — ABNORMAL HIGH (ref 70–99)
Potassium: 3.6 mmol/L (ref 3.5–5.1)
Sodium: 140 mmol/L (ref 135–145)

## 2019-06-05 LAB — POCT I-STAT 7, (LYTES, BLD GAS, ICA,H+H)
Acid-Base Excess: 9 mmol/L — ABNORMAL HIGH (ref 0.0–2.0)
Bicarbonate: 38.1 mmol/L — ABNORMAL HIGH (ref 20.0–28.0)
Calcium, Ion: 1.21 mmol/L (ref 1.15–1.40)
HCT: 46 % (ref 39.0–52.0)
Hemoglobin: 15.6 g/dL (ref 13.0–17.0)
O2 Saturation: 97 %
Potassium: 3.6 mmol/L (ref 3.5–5.1)
Sodium: 139 mmol/L (ref 135–145)
TCO2: 40 mmol/L — ABNORMAL HIGH (ref 22–32)
pCO2 arterial: 73.6 mmHg (ref 32.0–48.0)
pH, Arterial: 7.322 — ABNORMAL LOW (ref 7.350–7.450)
pO2, Arterial: 98 mmHg (ref 83.0–108.0)

## 2019-06-05 LAB — POCT I-STAT EG7
Acid-Base Excess: 8 mmol/L — ABNORMAL HIGH (ref 0.0–2.0)
Bicarbonate: 38.7 mmol/L — ABNORMAL HIGH (ref 20.0–28.0)
Calcium, Ion: 1.21 mmol/L (ref 1.15–1.40)
HCT: 48 % (ref 39.0–52.0)
Hemoglobin: 16.3 g/dL (ref 13.0–17.0)
O2 Saturation: 65 %
Potassium: 3.6 mmol/L (ref 3.5–5.1)
Sodium: 140 mmol/L (ref 135–145)
TCO2: 41 mmol/L — ABNORMAL HIGH (ref 22–32)
pCO2, Ven: 78 mmHg (ref 44.0–60.0)
pH, Ven: 7.304 (ref 7.250–7.430)
pO2, Ven: 39 mmHg (ref 32.0–45.0)

## 2019-06-05 LAB — CBC WITH DIFFERENTIAL/PLATELET
Abs Immature Granulocytes: 0.04 10*3/uL (ref 0.00–0.07)
Basophils Absolute: 0 10*3/uL (ref 0.0–0.1)
Basophils Relative: 0 %
Eosinophils Absolute: 0.2 10*3/uL (ref 0.0–0.5)
Eosinophils Relative: 2 %
HCT: 47.7 % (ref 39.0–52.0)
Hemoglobin: 14.7 g/dL (ref 13.0–17.0)
Immature Granulocytes: 0 %
Lymphocytes Relative: 12 %
Lymphs Abs: 1.1 10*3/uL (ref 0.7–4.0)
MCH: 28.4 pg (ref 26.0–34.0)
MCHC: 30.8 g/dL (ref 30.0–36.0)
MCV: 92.3 fL (ref 80.0–100.0)
Monocytes Absolute: 1 10*3/uL (ref 0.1–1.0)
Monocytes Relative: 11 %
Neutro Abs: 6.7 10*3/uL (ref 1.7–7.7)
Neutrophils Relative %: 75 %
Platelets: 168 10*3/uL (ref 150–400)
RBC: 5.17 MIL/uL (ref 4.22–5.81)
RDW: 14.3 % (ref 11.5–15.5)
WBC: 9.1 10*3/uL (ref 4.0–10.5)
nRBC: 0 % (ref 0.0–0.2)

## 2019-06-05 LAB — MAGNESIUM: Magnesium: 2 mg/dL (ref 1.7–2.4)

## 2019-06-05 SURGERY — RIGHT/LEFT HEART CATH AND CORONARY ANGIOGRAPHY
Anesthesia: LOCAL

## 2019-06-05 MED ORDER — HEPARIN (PORCINE) IN NACL 1000-0.9 UT/500ML-% IV SOLN
INTRAVENOUS | Status: DC | PRN
  Administered 2019-06-05 (×2): 500 mL

## 2019-06-05 MED ORDER — GADOBUTROL 1 MMOL/ML IV SOLN
15.0000 mL | Freq: Once | INTRAVENOUS | Status: AC | PRN
  Administered 2019-06-05: 15 mL via INTRAVENOUS

## 2019-06-05 MED ORDER — SODIUM CHLORIDE 0.9 % IV SOLN: 250.0000 mL | INTRAVENOUS | Status: DC | PRN

## 2019-06-05 MED ORDER — VERAPAMIL HCL 2.5 MG/ML IV SOLN
INTRAVENOUS | Status: AC
  Filled 2019-06-05: qty 2

## 2019-06-05 MED ORDER — ACETAMINOPHEN 325 MG PO TABS: 650.0000 mg | ORAL_TABLET | ORAL | Status: DC | PRN

## 2019-06-05 MED ORDER — LABETALOL HCL 5 MG/ML IV SOLN: 10.0000 mg | INTRAVENOUS | Status: AC | PRN

## 2019-06-05 MED ORDER — MIDAZOLAM HCL 2 MG/2ML IJ SOLN
INTRAMUSCULAR | Status: AC
  Filled 2019-06-05: qty 2

## 2019-06-05 MED ORDER — ONDANSETRON HCL 4 MG/2ML IJ SOLN: 4.0000 mg | Freq: Four times a day (QID) | INTRAMUSCULAR | Status: DC | PRN

## 2019-06-05 MED ORDER — SODIUM CHLORIDE 0.9% FLUSH: 3.0000 mL | INTRAVENOUS | Status: DC | PRN

## 2019-06-05 MED ORDER — LIDOCAINE HCL (PF) 1 % IJ SOLN
INTRAMUSCULAR | Status: AC
  Filled 2019-06-05: qty 30

## 2019-06-05 MED ORDER — FENTANYL CITRATE (PF) 100 MCG/2ML IJ SOLN
INTRAMUSCULAR | Status: DC | PRN
  Administered 2019-06-05: 25 ug via INTRAVENOUS

## 2019-06-05 MED ORDER — HEPARIN SODIUM (PORCINE) 1000 UNIT/ML IJ SOLN
INTRAMUSCULAR | Status: DC | PRN
  Administered 2019-06-05: 6000 [IU] via INTRAVENOUS

## 2019-06-05 MED ORDER — POTASSIUM CHLORIDE CRYS ER 20 MEQ PO TBCR
40.0000 meq | EXTENDED_RELEASE_TABLET | Freq: Once | ORAL | Status: AC
  Administered 2019-06-05: 16:00:00 40 meq via ORAL
  Filled 2019-06-05: qty 2

## 2019-06-05 MED ORDER — SODIUM CHLORIDE 0.9% FLUSH
3.0000 mL | Freq: Two times a day (BID) | INTRAVENOUS | Status: DC
  Administered 2019-06-05 – 2019-06-06 (×2): 3 mL via INTRAVENOUS

## 2019-06-05 MED ORDER — MIDAZOLAM HCL 2 MG/2ML IJ SOLN
INTRAMUSCULAR | Status: DC | PRN
  Administered 2019-06-05: 1 mg via INTRAVENOUS

## 2019-06-05 MED ORDER — HEPARIN SODIUM (PORCINE) 1000 UNIT/ML IJ SOLN
INTRAMUSCULAR | Status: AC
  Filled 2019-06-05: qty 1

## 2019-06-05 MED ORDER — IOHEXOL 350 MG/ML SOLN
INTRAVENOUS | Status: DC | PRN
  Administered 2019-06-05: 08:00:00 50 mL

## 2019-06-05 MED ORDER — VERAPAMIL HCL 2.5 MG/ML IV SOLN
INTRAVENOUS | Status: DC | PRN
  Administered 2019-06-05: 10 mL via INTRA_ARTERIAL

## 2019-06-05 MED ORDER — HEPARIN (PORCINE) IN NACL 1000-0.9 UT/500ML-% IV SOLN
INTRAVENOUS | Status: AC
  Filled 2019-06-05: qty 1000

## 2019-06-05 MED ORDER — SPIRONOLACTONE 25 MG PO TABS
25.0000 mg | ORAL_TABLET | Freq: Every day | ORAL | Status: DC
  Administered 2019-06-05 – 2019-06-07 (×3): 25 mg via ORAL
  Filled 2019-06-05 (×3): qty 1

## 2019-06-05 MED ORDER — HYDRALAZINE HCL 20 MG/ML IJ SOLN: 10.0000 mg | INTRAMUSCULAR | Status: AC | PRN

## 2019-06-05 MED ORDER — LIDOCAINE HCL (PF) 1 % IJ SOLN
INTRAMUSCULAR | Status: DC | PRN
  Administered 2019-06-05 (×2): 2 mL via INTRADERMAL

## 2019-06-05 MED ORDER — FENTANYL CITRATE (PF) 100 MCG/2ML IJ SOLN
INTRAMUSCULAR | Status: AC
  Filled 2019-06-05: qty 2

## 2019-06-05 SURGICAL SUPPLY — 11 items

## 2019-06-05 NOTE — Progress Notes (Addendum)
   06/05/19 1539  MEWS Assessment  Is this an acute change? Yes  MEWS guidelines implemented *See Row Information* Yellow  Provider Notification  Provider Name/Title Daleen Snook PA  Date Provider Notified 06/05/19  Time Provider Notified 1541  Notification Type Call  Response See new orders  Date of Provider Response 06/05/19  Time of Provider Response 1541   Mews score of 3: quick episode of SVT HR > 190.

## 2019-06-05 NOTE — Progress Notes (Signed)
Tr band is removed at this time. Insertion site is level 0.

## 2019-06-05 NOTE — Progress Notes (Signed)
Progress Note  Patient Name: Justin Rasmussen Date of Encounter: 06/05/2019  Primary Cardiologist: New (Sherin Murdoch)  Subjective   Seen immediately after cardiac catheterization which demonstrated near-normal coronary arteries and severely elevated right and left heart filling pressures. Mean PAWP is still 29 mmHg even after 33 pounds weight loss/13 L net diuresis diuresis. Mean PA pressure is 44 mmHg (transpulmonary gradient 15 mmHg) consistent with intrinsic arteriolar obstruction in addition to left heart failure.  This appears to be mostly if not entirely due to sleep disordered breathing. Cardiac index is reduced at 2.1 L/minute/m sq Renal parameters remain normal.  Marked chronic metabolic alkalosis and chronic respiratory acidosis.  Inpatient Medications    Scheduled Meds: . [MAR Hold] aspirin  81 mg Oral Daily  . [MAR Hold] enoxaparin (LOVENOX) injection  80 mg Subcutaneous Q24H  . [MAR Hold] furosemide  80 mg Intravenous BID  . [MAR Hold] ipratropium-albuterol  3 mL Nebulization BID  . [MAR Hold] losartan  50 mg Oral Daily  . [MAR Hold] mometasone-formoterol  2 puff Inhalation BID  . [MAR Hold] sodium chloride flush  3 mL Intravenous Q12H  . sodium chloride flush  3 mL Intravenous Q12H   Continuous Infusions: . sodium chloride     PRN Meds: sodium chloride, acetaminophen, [MAR Hold] albuterol, hydrALAZINE, labetalol, ondansetron (ZOFRAN) IV, sodium chloride flush   Vital Signs    Vitals:   06/05/19 0808 06/05/19 0813 06/05/19 0817 06/05/19 0836  BP: (!) 150/93 (!) 148/96 (!) 131/93 118/77  Pulse: 97 88 (!) 105 (!) 101  Resp: 19 (!) 24 (!) 22 16  Temp:    98.4 F (36.9 C)  TempSrc:    Oral  SpO2: 93% 97% (!) 0% 96%  Weight:      Height:        Intake/Output Summary (Last 24 hours) at 06/05/2019 0840 Last data filed at 06/05/2019 0516 Gross per 24 hour  Intake 680 ml  Output 1900 ml  Net -1220 ml   Last 3 Weights 06/05/2019 06/04/2019 06/03/2019  Weight (lbs)  302 lb 8 oz 307 lb 3.2 oz 319 lb 6.4 oz  Weight (kg) 137.213 kg 139.345 kg 144.879 kg      Telemetry    Very frequent atrial and ventricular ectopy, sinus rhythm - Personally Reviewed  ECG    no new tracing - Personally Reviewed  Physical Exam  Morbidly obese, looks OK lying flat GEN: No acute distress.   Neck: No JVD Cardiac: RRR, no murmurs, rubs, or gallops.  Respiratory: Clear to auscultation bilaterally. GI: Soft, nontender, non-distended  MS: No edema; No deformity. Neuro:  Nonfocal  Psych: Normal affect   Labs    High Sensitivity Troponin:   Recent Labs  Lab 06/03/19 0937  TROPONINIHS 38*      Chemistry Recent Labs  Lab 06/01/19 1630  06/03/19 0514 06/04/19 0709 06/05/19 0547  NA 142   < > 145 141 140  K 3.6   < > 3.5 3.5 3.6  CL 96*   < > 91* 93* 95*  CO2 35*   < > 44* 37* 36*  GLUCOSE 164*   < > 109* 104* 109*  BUN 18   < > 12 14 18   CREATININE 0.92   < > 0.97 0.92 1.04  CALCIUM 8.8*   < > 8.6* 8.7* 8.6*  PROT 6.4*  --  5.7*  --   --   ALBUMIN 3.9  --  3.5  --   --   AST  24  --  24  --   --   ALT 28  --  25  --   --   ALKPHOS 64  --  54  --   --   BILITOT 0.8  --  1.2  --   --   GFRNONAA >60   < > >60 >60 >60  GFRAA >60   < > >60 >60 >60  ANIONGAP 11   < > 10 11 9    < > = values in this interval not displayed.     Hematology Recent Labs  Lab 06/02/19 0000 06/03/19 0514 06/05/19 0547  WBC 9.7 7.6 9.1  RBC 5.14 4.94 5.17  HGB 15.2 14.6 14.7  HCT 48.3 46.5 47.7  MCV 94.0 94.1 92.3  MCH 29.6 29.6 28.4  MCHC 31.5 31.4 30.8  RDW 14.4 14.4 14.3  PLT 167 150 168    BNP Recent Labs  Lab 06/01/19 1630  BNP 322.5*     DDimer No results for input(s): DDIMER in the last 168 hours.   Radiology    No results found.  Cardiac Studies   Cardiac catheterization 06/05/2019  Mid LAD lesion is 10% stenosed.  Prox RCA lesion is 10% stenosed.  LV end diastolic pressure is moderately elevated.  There is no aortic valve stenosis.   Hemodynamic findings consistent with moderate pulmonary hypertension.  Ao sat 97%, PA sat 65%, mean PA pressure 44 mm Hg; mean PCWP 29 mm Hg; CO 5.1 L/min; CI 2.08   Nonischemic cardiomyopathy.  Moderate pulmonary HTN. Fick Cardiac Output 5.15 L/min  Fick Cardiac Output Index 2.08 (L/min)/BSA  RA A Wave 19 mmHg  RA V Wave 15 mmHg  RA Mean 16 mmHg  RV Systolic Pressure 51 mmHg  RV Diastolic Pressure 15 mmHg  RV EDP 18 mmHg  PA Systolic Pressure 54 mmHg  PA Diastolic Pressure 29 mmHg  PA Mean 44 mmHg  PW A Wave 31 mmHg  PW V Wave 33 mmHg  PW Mean 29 mmHg  AO Systolic Pressure 119 mmHg  AO Diastolic Pressure 83 mmHg  AO Mean 98 mmHg  LV Systolic Pressure 127 mmHg  LV Diastolic Pressure 18 mmHg  LV EDP 29 mmHg  AOp Systolic Pressure 121 mmHg  AOp Diastolic Pressure 79 mmHg  AOp Mean Pressure 99 mmHg  LVp Systolic Pressure 121 mmHg  LVp Diastolic Pressure 21 mmHg  LVp EDP Pressure 27 mmHg  QP/QS 1  TPVR Index 21.16 HRUI  TSVR Index 47.13 HRUI  PVR SVR Ratio 0.18  TPVR/TSVR Ratio 0.45   Echocardiogram 06/02/2019  1. Left ventricular ejection fraction, by visual estimation, is 30 to 35%. The left ventricle has severely decreased function. Left ventricular [size -correction]  was moderately increased. There is no left ventricular hypertrophy.  2. Entire lateral wall, entire anterior wall, and apex are abnormal.  3. Elevated mean left atrial pressure.  4. Left ventricular diastolic Doppler parameters are consistent with pseudonormalization pattern of LV diastolic filling.  5. Global right ventricle has mildly reduced systolic function.The right ventricular size is severely enlarged. No increase in right ventricular wall thickness.  6. Left atrial size was moderately dilated.  7. Right atrial size was moderately dilated.  8. Mild mitral annular calcification.  9. The mitral valve is normal in structure. Trace mitral valve regurgitation. 10. The tricuspid valve is normal in  structure. Tricuspid valve regurgitation is trivial. 11. The aortic valve is tricuspid Aortic valve regurgitation was not visualized by color flow Doppler. Mild aortic  valve sclerosis without stenosis. 12. The pulmonic valve was not well visualized. Pulmonic valve regurgitation is not visualized by color flow Doppler. 13. Moderately elevated pulmonary artery systolic pressure. 14. The inferior vena cava is dilated in size with <50% respiratory variability, suggesting right atrial pressure of 15 mmHg.  Patient Profile     63 y.o. male with new onset biventricular heart failure secondary to nonischemic cardiomyopathy, moderate pulmonary artery hypertension due to both left heart failure and cor pulmonale, probable obesity hypoventilation syndrome/obstructive sleep apnea, morbid obesity  Assessment & Plan    1. CHF: No evidence of coronary artery disease.  Retrospectively, reviewing his echocardiogram the hypokinesis is likely global, with exaggerated motion of the inferior wall and inferior septum generated by paradoxical septal motion due to right ventricular overload.  The cause of the left heart failure is not immediately apparent and the onset of symptoms was relatively recent. His ability to tolerate PAWP 30 lying flat suggests chronicity of disease. Consider cardiac MRI, but I am not sure his body habitus will permit imaging.  Still volume overloaded, resume diuretics and resume ARB (switch to Entresto if BP allows), start spironolactone.  Still too soon to start beta-blockers. 2. Acute on chronic cor pulmonale: high suspicion for chronic cor pulmonale due to OSA and obesity-hypoventilation sd. Note marked respiratory acidosis with metabolic compensation (and overshoot due to aggressive diuresis).  Recommend pulmonary consultation. 3. Morbid obesity/preDM  For questions or updates, please contact [facility] Please consult [email or web address] for contact info under        Signed, [name], MD  06/05/2019, 8:40 AM

## 2019-06-05 NOTE — Interval H&P Note (Signed)
Cath Lab Visit (complete for each Cath Lab visit)  Clinical Evaluation Leading to the Procedure:   ACS: Yes.    Non-ACS:    Anginal Classification: CCS IV  Anti-ischemic medical therapy: Minimal Therapy (1 class of medications)  Non-Invasive Test Results: High-risk stress test findings: cardiac mortality >3%/year low EF by echo  Prior CABG: No previous CABG      History and Physical Interval Note:  06/05/2019 7:34 AM  Gemini Meiser  has presented today for surgery, with the diagnosis of chest pain.  The various methods of treatment have been discussed with the patient and family. After consideration of risks, benefits and other options for treatment, the patient has consented to  Procedure(s): RIGHT/LEFT HEART CATH AND CORONARY ANGIOGRAPHY (N/A) as a surgical intervention.  The patient's history has been reviewed, patient examined, no change in status, stable for surgery.  I have reviewed the patient's chart and labs.  Questions were answered to the patient's satisfaction.     Larae Grooms

## 2019-06-05 NOTE — Plan of Care (Signed)
  Problem: Education: Goal: Knowledge of General Education information will improve Description Including pain rating scale, medication(s)/side effects and non-pharmacologic comfort measures Outcome: Progressing   Problem: Health Behavior/Discharge Planning: Goal: Ability to manage health-related needs will improve Outcome: Progressing   

## 2019-06-05 NOTE — Progress Notes (Signed)
Patient ID: Justin Rasmussen, male   DOB: 06-05-1956, 63 y.o.   MRN: [id number]  PROGRESS NOTE    Justin Rasmussen  RKY:[id number] DOB: 1956/02/24 DOA: 06/01/2019 PCP: Patient, No Pcp Per   Brief Narrative:  63 year old male with unknown medical history presented with worsening shortness of breath along with lower extremity edema on 06/01/2019.  He was found to be hypoxic on presentation and chest x-ray showed right-sided effusion with underlying opacity representing atelectasis versus infiltrate.  He was started on intravenous Lasix.  Echo showed EF of 30 to 35%.  Cardiology was consulted.  Cardiology requested pulmonary evaluation as well.  He is planned for cardiac catheterization today.  Assessment & Plan:   Acute hypoxic respiratory failure Probable acute systolic and diastolic CHF COPD, doubt that patient has exacerbation Undiagnosed probable obstructive sleep apnea versus obesity hypoventilation syndrome -On presentation, patient required 4 L oxygen.  Does not wear oxygen at home. -Currently still requiring oxygen via nasal cannula at 2L/min.  Wean off as able. -Continue Lasix  80 mg IV every 12 hours.  lower extremity duplex ultrasound was negative for DVT.  Strict input and output.  Daily weights.  Negative balance of 12,793 cc since admission.  Fluid restriction.  Echo shows EF of 30 to 35%.  Cardiology evaluation appreciated.  Probable cardiac cath today today.  Continue losartan. -Continue nebs and Dulera.  Not wheezing currently, no need for steroids at this time.  Pulmonary evaluation appreciated.  Outpatient follow-up with pulmonary.  Morbid obesity  -Outpatient follow-up.  Counseled about lifestyle changes and weight loss    DVT prophylaxis: Lovenox Code Status: Full Family Communication: None at bedside Disposition Plan: Home in 1-2 days once clinically improved once cleared by cardiology  Consultants: None  Procedures:  Echo IMPRESSIONS    1. Left ventricular ejection  fraction, by visual estimation, is 30 to 35%. The left ventricle has severely decreased function. Left ventricular septal wall thickness was moderately increased. There is no left ventricular hypertrophy.  2. Entire lateral wall, entire anterior wall, and apex are abnormal.  3. Elevated mean left atrial pressure.  4. Left ventricular diastolic Doppler parameters are consistent with pseudonormalization pattern of LV diastolic filling.  5. Global right ventricle has mildly reduced systolic function.The right ventricular size is severely enlarged. No increase in right ventricular wall thickness.  6. Left atrial size was moderately dilated.  7. Right atrial size was moderately dilated.  8. Mild mitral annular calcification.  9. The mitral valve is normal in structure. Trace mitral valve regurgitation. 10. The tricuspid valve is normal in structure. Tricuspid valve regurgitation is trivial. 11. The aortic valve is tricuspid Aortic valve regurgitation was not visualized by color flow Doppler. Mild aortic valve sclerosis without stenosis. 12. The pulmonic valve was not well visualized. Pulmonic valve regurgitation is not visualized by color flow Doppler. 13. Moderately elevated pulmonary artery systolic pressure. 14. The inferior vena cava is dilated in size with <50% respiratory variability, suggesting right atrial pressure of 15 mmHg.  Antimicrobials: None   Subjective: Patient seen and examined at bedside.  Denies worsening shortness of breath, chest pain.  No overnight fever or vomiting.  objective: Vitals:   06/04/19 1931 06/04/19 1946 06/05/19 0511 06/05/19 0607  BP:  126/74 130/83   Pulse:  (!) 101 93 91  Resp:  20 18   Temp:  99 F (37.2 C) 98.8 F (37.1 C)   TempSrc:  Oral Oral   SpO2: 95% 93% 94%   Weight:   Marland Kitchen)  137.2 kg   Height:        Intake/Output Summary (Last 24 hours) at 06/05/2019 0713 Last data filed at 06/05/2019 0516 Gross per 24 hour  Intake 680 ml  Output 1900 ml   Net -1220 ml   Filed Weights   06/03/19 0515 06/04/19 0438 06/05/19 0511  Weight: (!) 144.9 kg (!) 139.3 kg (!) 137.2 kg    Examination:  General exam: No acute distress.   Respiratory system: Bilateral decreased breath sounds at bases with some basilar crackles.   Cardiovascular system: Rate controlled, S1-S2 heard Gastrointestinal system: Abdomen is morbidly obese, nondistended, soft and nontender. Normal bowel sounds heard. Extremities: No cyanosis; bilateral lower extremity 1-2+  edema   Data Reviewed: I have personally reviewed following labs and imaging studies  CBC: Recent Labs  Lab 06/01/19 1630 06/02/19 0000 06/03/19 0514 06/05/19 0547  WBC 9.4 9.7 7.6 9.1  NEUTROABS 7.2  --  5.8 6.7  HGB 15.0 15.2 14.6 14.7  HCT 50.4 48.3 46.5 47.7  MCV 94.6 94.0 94.1 92.3  PLT 189 167 150 168   Basic Metabolic Panel: Recent Labs  Lab 06/02/19 0000 06/02/19 1050 06/03/19 0514 06/04/19 0709 06/05/19 0547  NA 142 141 145 141 140  K 3.6 3.8 3.5 3.5 3.6  CL 95* 96* 91* 93* 95*  CO2 38* 38* 44* 37* 36*  GLUCOSE 130* 121* 109* 104* 109*  BUN 15 15 12 14 18   CREATININE 0.97 0.89 0.97 0.92 1.04  CALCIUM 8.5* 8.3* 8.6* 8.7* 8.6*  MG  --  2.1 2.0 2.0 2.0   GFR: Estimated Creatinine Clearance: 102.7 mL/min (by C-G formula based on SCr of 1.04 mg/dL). Liver Function Tests: Recent Labs  Lab 06/01/19 1630 06/03/19 0514  AST 24 24  ALT 28 25  ALKPHOS 64 54  BILITOT 0.8 1.2  PROT 6.4* 5.7*  ALBUMIN 3.9 3.5   No results for input(s): LIPASE, AMYLASE in the last 168 hours. No results for input(s): AMMONIA in the last 168 hours. Coagulation Profile: No results for input(s): INR, PROTIME in the last 168 hours. Cardiac Enzymes: No results for input(s): CKTOTAL, CKMB, CKMBINDEX, TROPONINI in the last 168 hours. BNP (last 3 results) No results for input(s): PROBNP in the last 8760 hours. HbA1C: No results for input(s): HGBA1C in the last 72 hours. CBG: Recent Labs  Lab  06/04/19 1724  GLUCAP 98   Lipid Profile: No results for input(s): CHOL, HDL, LDLCALC, TRIG, CHOLHDL, LDLDIRECT in the last 72 hours. Thyroid Function Tests: Recent Labs    06/04/19 0709  TSH 1.966   Anemia Panel: No results for input(s): VITAMINB12, FOLATE, FERRITIN, TIBC, IRON, RETICCTPCT in the last 72 hours. Sepsis Labs: No results for input(s): PROCALCITON, LATICACIDVEN in the last 168 hours.  Recent Results (from the past 240 hour(s))  SARS CORONAVIRUS 2 (TAT 6-24 HRS) Nasopharyngeal Nasopharyngeal Swab     Status: None   Collection Time: 06/01/19  5:45 PM   Specimen: Nasopharyngeal Swab  Result Value Ref Range Status   SARS Coronavirus 2 NEGATIVE NEGATIVE Final    Comment: (NOTE) SARS-CoV-2 target nucleic acids are NOT DETECTED. The SARS-CoV-2 RNA is generally detectable in upper and lower respiratory specimens during the acute phase of infection. Negative results do not preclude SARS-CoV-2 infection, do not rule out co-infections with other pathogens, and should not be used as the sole basis for treatment or other patient management decisions. Negative results must be combined with clinical observations, patient history, and epidemiological information. The expected  result is Negative. Fact Sheet for Patients: HairSlick.no Fact Sheet for Healthcare Providers: quierodirigir.com This test is not yet approved or cleared by the [place] FDA and  has been authorized for detection and/or diagnosis of SARS-CoV-2 by FDA under an Emergency Use Authorization (EUA). This EUA will remain  in effect (meaning this test can be used) for the duration of the COVID-19 declaration under Section 56 4(b)(1) of the Act, 21 U.S.C. section 360bbb-3(b)(1), unless the authorization is terminated or revoked sooner. Performed at [facility], [address] [place], [place] [address]          Radiology Studies: No  results found.      Scheduled Meds: . aspirin  81 mg Oral Daily  . enoxaparin (LOVENOX) injection  80 mg Subcutaneous Q24H  . furosemide  80 mg Intravenous BID  . ipratropium-albuterol  3 mL Nebulization BID  . losartan  50 mg Oral Daily  . mometasone-formoterol  2 puff Inhalation BID  . sodium chloride flush  3 mL Intravenous Q12H   Continuous Infusions: . sodium chloride    . sodium chloride 10 mL/hr at 06/05/19 0533     LOS: 4 days        Justin Lloyd, MD [facility] 06/05/2019, 7:13 AM

## 2019-06-05 NOTE — TOC Progression Note (Addendum)
Transition of Care [facility]) - Progression Note    Patient Details  Name: Justin Rasmussen MRN: [id number] Date of Birth: [birth year]  [facility] CM/SW Contact  Zenon Mayo, RN Phone Number: 06/05/2019, 4:55 PM  Clinical Narrative:    Patient may need home oxygen at dc for charity, NCM made referral to [place] with [employer].  Patient for possible dc on 9/30.  Will need oxygen order and ambulatory sats. NCM informed staff RN, we will need ambulatory sats. Patient states he has no preference for HHPT, he would like to work with [employer] for home oxygen.  Referral given to [place] with [employer].   Expected Discharge Plan: Home/Self Care Barriers to Discharge: No Barriers Identified  Expected Discharge Plan and Services Expected Discharge Plan: Home/Self Care In-house Referral: NA Discharge Planning Services: CM Consult Post Acute Care Choice: NA Living arrangements for the past 2 months: Single Family Home                 DME Arranged: (NA)         HH Arranged: NA           Social Determinants of Health (SDOH) Interventions    Readmission Risk Interventions No flowsheet data found.

## 2019-06-06 LAB — LIPID PANEL
Cholesterol: 154 mg/dL (ref 0–200)
HDL: 34 mg/dL — ABNORMAL LOW (ref >40)
LDL Cholesterol: 105 mg/dL — ABNORMAL HIGH (ref 0–99)
Total CHOL/HDL Ratio: 4.5 RATIO
Triglycerides: 76 mg/dL (ref <150)
VLDL: 15 mg/dL (ref 0–40)

## 2019-06-06 MED ORDER — FUROSEMIDE 80 MG PO TABS
80.0000 mg | ORAL_TABLET | Freq: Two times a day (BID) | ORAL | Status: DC
  Administered 2019-06-06 – 2019-06-07 (×2): 80 mg via ORAL
  Filled 2019-06-06 (×2): qty 1

## 2019-06-06 MED ORDER — CARVEDILOL 3.125 MG PO TABS
3.1250 mg | ORAL_TABLET | Freq: Two times a day (BID) | ORAL | Status: DC
  Administered 2019-06-06 – 2019-06-07 (×2): 3.125 mg via ORAL
  Filled 2019-06-06 (×2): qty 1

## 2019-06-06 MED FILL — Verapamil HCl IV Soln 2.5 MG/ML: INTRAVENOUS | Qty: 2 | Status: AC

## 2019-06-06 NOTE — Plan of Care (Signed)
°  Problem: Clinical Measurements: °Goal: Respiratory complications will improve °Outcome: Progressing °  °Problem: Activity: °Goal: Capacity to carry out activities will improve °Outcome: Progressing °  °

## 2019-06-06 NOTE — Progress Notes (Signed)
Progress Note  Patient Name: Justin Rasmussen Date of Encounter: 06/06/2019  Primary Cardiologist:  New (Kamila Broda)  Subjective   Feels well. No dyspnea. Additional 2.3L diuresis since his cath. Net weight loss 46 lb. Fewer episodes of PAT on telemetry. cMRI performed, interpretation pending. No evidence of LGE/scar on my review.  Inpatient Medications    Scheduled Meds: . aspirin  81 mg Oral Daily  . carvedilol  3.125 mg Oral BID WC  . enoxaparin (LOVENOX) injection  80 mg Subcutaneous Q24H  . furosemide  80 mg Intravenous BID  . furosemide  80 mg Oral BID  . ipratropium-albuterol  3 mL Nebulization BID  . losartan  50 mg Oral Daily  . mometasone-formoterol  2 puff Inhalation BID  . sodium chloride flush  3 mL Intravenous Q12H  . sodium chloride flush  3 mL Intravenous Q12H  . spironolactone  25 mg Oral Daily   Continuous Infusions: . sodium chloride     PRN Meds: sodium chloride, acetaminophen, albuterol, ondansetron (ZOFRAN) IV, sodium chloride flush   Vital Signs    Vitals:   06/06/19 0446 06/06/19 0808 06/06/19 0810 06/06/19 0814  BP: 106/69 115/69    Pulse: 98 88 93   Resp: 20  18   Temp: 98.8 F (37.1 C)     TempSrc: Oral     SpO2: 94% 95% 94% 94%  Weight: 135.1 kg     Height:        Intake/Output Summary (Last 24 hours) at 06/06/2019 0853 Last data filed at 06/06/2019 0447 Gross per 24 hour  Intake 480 ml  Output 2770 ml  Net -2290 ml   Last 3 Weights 06/06/2019 06/05/2019 06/04/2019  Weight (lbs) 297 lb 14.4 oz 302 lb 8 oz 307 lb 3.2 oz  Weight (kg) 135.127 kg 137.213 kg 139.345 kg      Telemetry    NSR, PACs and very fast nonsustained PAT (only 5-6 episodes last 24h, compared to 5-6 episodes per hour earlier in the week) - Personally Reviewed  ECG    No new tracing - Personally Reviewed  Physical Exam  Morbidly obese GEN: No acute distress.   Neck: unable to see JVD Cardiac: RRR, no murmurs, rubs, or gallops. Occ ectopy Respiratory:  Clear to auscultation bilaterally. GI: Soft, nontender, non-distended  MS: No edema; No deformity. Neuro:  Nonfocal  Psych: Normal affect   Labs    High Sensitivity Troponin:   Recent Labs  Lab 06/03/19 0937  TROPONINIHS 38*      Chemistry Recent Labs  Lab 06/01/19 1630  06/03/19 0514 06/04/19 0709 06/05/19 0547 06/05/19 0753 06/05/19 0800  NA 142   < > 145 141 140 139 140  K 3.6   < > 3.5 3.5 3.6 3.6 3.6  CL 96*   < > 91* 93* 95*  --   --   CO2 35*   < > 44* 37* 36*  --   --   GLUCOSE 164*   < > 109* 104* 109*  --   --   BUN 18   < > 12 14 18   --   --   CREATININE   < > 0.97 0.92 1.04  --   --   CALCIUM 8.8*   < > 8.6* 8.7* 8.6*  --   --   PROT 6.4*  --  5.7*  --   --   --   --   ALBUMIN 3.9  --  3.5  --   --   --   --  AST 24  --  24  --   --   --   --   ALT 28  --  25  --   --   --   --   ALKPHOS 64  --  54  --   --   --   --   BILITOT 0.8  --  1.2  --   --   --   --   GFRNONAA >60   < > >60 >60 >60  --   --   GFRAA >60   < > >60 >60 >60  --   --   ANIONGAP 11   < > 10 11 9   --   --    < > = values in this interval not displayed.     Hematology Recent Labs  Lab 06/02/19 0000 06/03/19 0514 06/05/19 0547 06/05/19 0753 06/05/19 0800  WBC 9.7 7.6 9.1  --   --   RBC 5.14 4.94 5.17  --   --   HGB 15.2 14.6 14.7 15.6 16.3  HCT 48.3 46.5 47.7 46.0 48.0  MCV 94.0 94.1 92.3  --   --   MCH 29.6 29.6 28.4  --   --   MCHC 31.5 31.4 30.8  --   --   RDW 14.4 14.4 14.3  --   --   PLT 167 150 168  --   --     BNP Recent Labs  Lab 06/01/19 1630  BNP 322.5*     DDimer No results for input(s): DDIMER in the last 168 hours.   Radiology    No results found.  Cardiac Studies   Cardiac catheterization 06/05/2019  Mid LAD lesion is 10% stenosed.  Prox RCA lesion is 10% stenosed.  LV end diastolic pressure is moderately elevated.  There is no aortic valve stenosis.  Hemodynamic findings consistent with moderate pulmonary hypertension.  Ao sat  97%, PA sat 65%, mean PA pressure 44 mm Hg; mean PCWP 29 mm Hg; CO 5.1 L/min; CI 2.08  Nonischemic cardiomyopathy.  Moderate pulmonary HTN. Fick Cardiac Output 5.15 L/min  Fick Cardiac Output Index 2.08 (L/min)/BSA  RA A Wave 19 mmHg  RA V Wave 15 mmHg  RA Mean 16 mmHg  RV Systolic Pressure 51 mmHg  RV Diastolic Pressure 15 mmHg  RV EDP 18 mmHg  PA Systolic Pressure 54 mmHg  PA Diastolic Pressure 29 mmHg  PA Mean 44 mmHg  PW A Wave 31 mmHg  PW V Wave 33 mmHg  PW Mean 29 mmHg  AO Systolic Pressure 119 mmHg  AO Diastolic Pressure 83 mmHg  AO Mean 98 mmHg  LV Systolic Pressure 127 mmHg  LV Diastolic Pressure 18 mmHg  LV EDP 29 mmHg  AOp Systolic Pressure 121 mmHg  AOp Diastolic Pressure 79 mmHg  AOp Mean Pressure 99 mmHg  LVp Systolic Pressure 121 mmHg  LVp Diastolic Pressure 21 mmHg  LVp EDP Pressure 27 mmHg  QP/QS 1  TPVR Index 21.16 HRUI  TSVR Index 47.13 HRUI  PVR SVR Ratio 0.18  TPVR/TSVR Ratio 0.45   Echocardiogram 06/02/2019 1. Left ventricular ejection fraction, by visual estimation, is 30 to 35%. The left ventricle has severely decreased function. Left ventricular [size -correction]  was moderately increased. There is no left ventricular hypertrophy. 2. Entire lateral wall, entire anterior wall, and apex are abnormal. 3. Elevated mean left atrial pressure. 4. Left ventricular diastolic Doppler parameters are consistent with pseudonormalization pattern of LV diastolic filling. 5. Global  right ventricle has mildly reduced systolic function.The right ventricular size is severely enlarged. No increase in right ventricular wall thickness. 6. Left atrial size was moderately dilated. 7. Right atrial size was moderately dilated. 8. Mild mitral annular calcification. 9. The mitral valve is normal in structure. Trace mitral valve regurgitation. 10. The tricuspid valve is normal in structure. Tricuspid valve regurgitation is trivial. 11. The aortic valve is  tricuspid Aortic valve regurgitation was not visualized by color flow Doppler. Mild aortic valve sclerosis without stenosis. 12. The pulmonic valve was not well visualized. Pulmonic valve regurgitation is not visualized by color flow Doppler. 13. Moderately elevated pulmonary artery systolic pressure. 14. The inferior vena cava is dilated in size with <50% respiratory variability, suggesting right atrial pressure of 15 mmHg.    Patient Profile     63 y.o. male with new onset biventricular heart failure secondary to nonischemic cardiomyopathy, moderate pulmonary artery hypertension due to both left heart failure and cor pulmonale, probable obesity hypoventilation syndrome/obstructive sleep apnea, morbid obesity  Assessment & Plan    1. CHF: No evidence of coronary artery disease.  Seems close to euvolemia. Pending results of cardiac MRI.  Start beta-blockers today and switch to PO diuretics. Tentatively ready for DC tomorrow.. 2. Acute on chronic cor pulmonale: high suspicion for chronic cor pulmonale due to OSA and obesity-hypoventilation sd. Recommend pulmonary consultation. Needs sleep study 3.PAT: cause-effect relationship with CMP is uncertain. Reevaluate with an arrhythmia monitor after we have titrated the beta blocker to the max tolerated dose. 4. Morbid obesity/preDM     For questions or updates, please contact [facility] Please consult [email or web address] for contact info under        Signed, Sanda Klein, MD  06/06/2019, 8:53 AM

## 2019-06-06 NOTE — Progress Notes (Signed)
Patient ID: Justin Rasmussen, male   DOB: 01/04/1956, 63 y.o.   MRN: [id number]  PROGRESS NOTE    Justin Rasmussen  WUJ:[id number] DOB: 02-17-1956 DOA: 06/01/2019 PCP: Patient, No Pcp Per   Brief Narrative:  63 year old male with unknown medical history presented with worsening shortness of breath along with lower extremity edema on 06/01/2019.  He was found to be hypoxic on presentation and chest x-ray showed right-sided effusion with underlying opacity representing atelectasis versus infiltrate.  He was started on intravenous Lasix.  Echo showed EF of 30 to 35%.  Cardiology was consulted.  Cardiology requested pulmonary evaluation as well.  He is planned for cardiac catheterization today.  06/06/2019: Patient seen.  Also updated patient's wife.  Cardiology and PT is highly appreciated.  Patient underwent cardiac catheterization without significant coronary artery disease.  Echo finding is noted, EF of 30 to 35% with pseudonormalization of the left ventricular diastolic filling.  Patient symptoms have improved significantly with diuresis.  Cardiac MRI is pending.  Plan is for possible discharge home tomorrow.  Patient will need supplemental oxygen on discharge.  Patient also have previously undiagnosed OSA/OHS.  Patient will need sleep studies on discharge.  Assessment & Plan:   Acute on chronic hypoxic and hypercapnic respiratory failure/acute on chronic combined systolic and diastolic congestive heart failure:  -Likely undiagnosed probable obstructive sleep apnea versus obesity hypoventilation syndrome -On presentation, patient required 4 L oxygen.  Does not wear oxygen at home. -Currently still requiring oxygen via nasal cannula at 2L/min.  Wean off as able. -Continue Lasix  80 mg IV every 12 hours.  lower extremity duplex ultrasound was negative for DVT.  Strict input and output.  Daily weights.  Negative balance of 12,793 cc since admission.  Fluid restriction.  Echo shows EF of 30 to 35%.  Cardiology  evaluation appreciated.  Probable cardiac cath today today.  Continue losartan. -Continue nebs and Dulera.  Not wheezing currently, no need for steroids at this time.  Pulmonary evaluation appreciated.  Outpatient follow-up with pulmonary. 06/06/2019: Currently see above documentation.  Patient has combined hypoxic and hypercapnic respiratory failure, likely acute on chronic.  Morbid obesity  -Outpatient follow-up.  Counseled about lifestyle changes and weight loss 06/06/2019: Currently see above documentation.  Patient will likely need outpatient sleep studies.  Supplemental oxygen on discharge.  Likely undiagnosed OSA/OHS:  Outpatient sleep studies on discharge.  DVT prophylaxis: Lovenox Code Status: Full Family Communication: None at bedside Disposition Plan: Home in 1-2 days once clinically improved once cleared by cardiology  Consultants: None  Procedures:  Echo IMPRESSIONS    1. Left ventricular ejection fraction, by visual estimation, is 30 to 35%. The left ventricle has severely decreased function. Left ventricular septal wall thickness was moderately increased. There is no left ventricular hypertrophy.  2. Entire lateral wall, entire anterior wall, and apex are abnormal.  3. Elevated mean left atrial pressure.  4. Left ventricular diastolic Doppler parameters are consistent with pseudonormalization pattern of LV diastolic filling.  5. Global right ventricle has mildly reduced systolic function.The right ventricular size is severely enlarged. No increase in right ventricular wall thickness.  6. Left atrial size was moderately dilated.  7. Right atrial size was moderately dilated.  8. Mild mitral annular calcification.  9. The mitral valve is normal in structure. Trace mitral valve regurgitation. 10. The tricuspid valve is normal in structure. Tricuspid valve regurgitation is trivial. 11. The aortic valve is tricuspid Aortic valve regurgitation was not visualized by color flow  Doppler. Mild  aortic valve sclerosis without stenosis. 12. The pulmonic valve was not well visualized. Pulmonic valve regurgitation is not visualized by color flow Doppler. 13. Moderately elevated pulmonary artery systolic pressure. 14. The inferior vena cava is dilated in size with <50% respiratory variability, suggesting right atrial pressure of 15 mmHg.  Antimicrobials: None   Subjective: Patient seen  Shortness of breath has improved significantly. No chest pain.  Objective: Vitals:   06/06/19 0808 06/06/19 0810 06/06/19 0814 06/06/19 1151  BP: 115/69   94/63  Pulse: 88 93  80  Resp:  18  20  Temp:    98.8 F (37.1 C)  TempSrc:    Oral  SpO2: 95% 94% 94% 96%  Weight:      Height:        Intake/Output Summary (Last 24 hours) at 06/06/2019 1238 Last data filed at 06/06/2019 1153 Gross per 24 hour  Intake 822 ml  Output 3125 ml  Net -2303 ml   Filed Weights   06/04/19 0438 06/05/19 0511 06/06/19 0446  Weight: (!) 139.3 kg (!) 137.2 kg 135.1 kg    Examination:  General exam: No acute distress.   Respiratory system: Decreased air entry globally.   Cardiovascular system: S1-S2.   Gastrointestinal system: Abdomen is morbidly obese, nondistended, soft and nontender. Normal bowel sounds heard. Extremities: Bilateral ankle edema.   Neuro: Patient is awake and alert.  Patient moves all extremities.    Data Reviewed: I have personally reviewed following labs and imaging studies  CBC: Recent Labs  Lab 06/01/19 1630 06/02/19 0000 06/03/19 0514 06/05/19 0547 06/05/19 0753 06/05/19 0800  WBC 9.4 9.7 7.6 9.1  --   --   NEUTROABS 7.2  --  5.8 6.7  --   --   HGB 15.0 15.2 14.6 14.7 15.6 16.3  HCT 50.4 48.3 46.5 47.7 46.0 48.0  MCV 94.6 94.0 94.1 92.3  --   --   PLT 189 167 150 168  --   --    Basic Metabolic Panel: Recent Labs  Lab 06/02/19 0000 06/02/19 1050 06/03/19 0514 06/04/19 0709 06/05/19 0547 06/05/19 0753 06/05/19 0800  NA 142 141 145 141 140 139  140  K 3.6 3.8 3.5 3.5 3.6 3.6 3.6  CL 95* 96* 91* 93* 95*  --   --   CO2 38* 38* 44* 37* 36*  --   --   GLUCOSE 130* 121* 109* 104* 109*  --   --   BUN 15 15 12 14 18   --   --   CREATININE 0.97 0.89 0.97 0.92 1.04  --   --   CALCIUM 8.5* 8.3* 8.6* 8.7* 8.6*  --   --   MG  --  2.1 2.0 2.0 2.0  --   --    GFR: Estimated Creatinine Clearance: 101.8 mL/min (by C-G formula based on SCr of 1.04 mg/dL). Liver Function Tests: Recent Labs  Lab 06/01/19 1630 06/03/19 0514  AST 24 24  ALT 28 25  ALKPHOS 64 54  BILITOT 0.8 1.2  PROT 6.4* 5.7*  ALBUMIN 3.9 3.5   No results for input(s): LIPASE, AMYLASE in the last 168 hours. No results for input(s): AMMONIA in the last 168 hours. Coagulation Profile: No results for input(s): INR, PROTIME in the last 168 hours. Cardiac Enzymes: No results for input(s): CKTOTAL, CKMB, CKMBINDEX, TROPONINI in the last 168 hours. BNP (last 3 results) No results for input(s): PROBNP in the last 8760 hours. HbA1C: No results for input(s): HGBA1C in  the last 72 hours. CBG: Recent Labs  Lab 06/04/19 1724  GLUCAP 98   Lipid Profile: Recent Labs    06/06/19 0457  CHOL 154  HDL 34*  LDLCALC 105*  TRIG 76  CHOLHDL 4.5   Thyroid Function Tests: Recent Labs    06/04/19 0709  TSH 1.966   Anemia Panel: No results for input(s): VITAMINB12, FOLATE, FERRITIN, TIBC, IRON, RETICCTPCT in the last 72 hours. Sepsis Labs: No results for input(s): PROCALCITON, LATICACIDVEN in the last 168 hours.  Recent Results (from the past 240 hour(s))  SARS CORONAVIRUS 2 (TAT 6-24 HRS) Nasopharyngeal Nasopharyngeal Swab     Status: None   Collection Time: 06/01/19  5:45 PM   Specimen: Nasopharyngeal Swab  Result Value Ref Range Status   SARS Coronavirus 2 NEGATIVE NEGATIVE Final    Comment: (NOTE) SARS-CoV-2 target nucleic acids are NOT DETECTED. The SARS-CoV-2 RNA is generally detectable in upper and lower respiratory specimens during the acute phase of  infection. Negative results do not preclude SARS-CoV-2 infection, do not rule out co-infections with other pathogens, and should not be used as the sole basis for treatment or other patient management decisions. Negative results must be combined with clinical observations, patient history, and epidemiological information. The expected result is Negative. Fact Sheet for Patients: HairSlick.no Fact Sheet for Healthcare Providers: quierodirigir.com This test is not yet approved or cleared by the [place] FDA and  has been authorized for detection and/or diagnosis of SARS-CoV-2 by FDA under an Emergency Use Authorization (EUA). This EUA will remain  in effect (meaning this test can be used) for the duration of the COVID-19 declaration under Section 56 4(b)(1) of the Act, 21 U.S.C. section 360bbb-3(b)(1), unless the authorization is terminated or revoked sooner. Performed at [facility], [address] [place], [place] [address]          Radiology Studies: No results found.      Scheduled Meds:  aspirin  81 mg Oral Daily   carvedilol  3.125 mg Oral BID WC   enoxaparin (LOVENOX) injection  80 mg Subcutaneous Q24H   furosemide  80 mg Intravenous BID   furosemide  80 mg Oral BID   ipratropium-albuterol  3 mL Nebulization BID   losartan  50 mg Oral Daily   mometasone-formoterol  2 puff Inhalation BID   sodium chloride flush  3 mL Intravenous Q12H   sodium chloride flush  3 mL Intravenous Q12H   spironolactone  25 mg Oral Daily   Continuous Infusions:  sodium chloride       LOS: 5 days        Barnetta Chapel, MD [facility] 06/06/2019, 12:38 PM

## 2019-06-06 NOTE — Progress Notes (Signed)
SATURATION QUALIFICATIONS: (This note is used to comply with regulatory documentation for home oxygen)  Patient Saturations on Room Air at Rest = 87%  Patient Saturations on Room Air while Ambulating = 85%  Patient Saturations on 3 Liters of oxygen while Ambulating = 92%  Please briefly explain why patient needs home oxygen:  Pt O2 dropped down to 85% on RA while ambualting

## 2019-06-07 ENCOUNTER — Telehealth: Payer: Self-pay | Admitting: Physician Assistant

## 2019-06-07 DIAGNOSIS — I5041 Acute combined systolic (congestive) and diastolic (congestive) heart failure: Secondary | ICD-10-CM

## 2019-06-07 LAB — RENAL FUNCTION PANEL
Albumin: 3.1 g/dL — ABNORMAL LOW (ref 3.5–5.0)
Anion gap: 9 (ref 5–15)
BUN: 21 mg/dL (ref 8–23)
CO2: 35 mmol/L — ABNORMAL HIGH (ref 22–32)
Calcium: 8.2 mg/dL — ABNORMAL LOW (ref 8.9–10.3)
Chloride: 96 mmol/L — ABNORMAL LOW (ref 98–111)
Creatinine, Ser: 0.81 mg/dL (ref 0.61–1.24)
GFR calc Af Amer: >60 mL/min (ref >60)
GFR calc non Af Amer: >60 mL/min (ref >60)
Glucose, Bld: 109 mg/dL — ABNORMAL HIGH (ref 70–99)
Phosphorus: 3.5 mg/dL (ref 2.5–4.6)
Potassium: 3.4 mmol/L — ABNORMAL LOW (ref 3.5–5.1)
Sodium: 140 mmol/L (ref 135–145)

## 2019-06-07 LAB — MAGNESIUM: Magnesium: 2 mg/dL (ref 1.7–2.4)

## 2019-06-07 MED ORDER — SPIRONOLACTONE 25 MG PO TABS: 25.0000 mg | ORAL_TABLET | Freq: Every day | ORAL | 0 refills | Status: DC

## 2019-06-07 MED ORDER — SPIRIVA HANDIHALER 18 MCG IN CAPS: 18.0000 ug | ORAL_CAPSULE | Freq: Every day | RESPIRATORY_TRACT | 3 refills | Status: DC

## 2019-06-07 MED ORDER — SPIRIVA HANDIHALER 18 MCG IN CAPS: 18.0000 ug | ORAL_CAPSULE | Freq: Every day | RESPIRATORY_TRACT | 2 refills | Status: DC

## 2019-06-07 MED ORDER — ASPIRIN 81 MG PO CHEW: 81.0000 mg | CHEWABLE_TABLET | Freq: Every day | ORAL | 0 refills | Status: DC

## 2019-06-07 MED ORDER — LOSARTAN POTASSIUM 50 MG PO TABS: 50.0000 mg | ORAL_TABLET | Freq: Every day | ORAL | 0 refills | Status: DC

## 2019-06-07 MED ORDER — POTASSIUM CHLORIDE ER 10 MEQ PO TBCR: 10.0000 meq | EXTENDED_RELEASE_TABLET | Freq: Every day | ORAL | 0 refills | Status: DC

## 2019-06-07 MED ORDER — MOMETASONE FURO-FORMOTEROL FUM 100-5 MCG/ACT IN AERO: 2.0000 | INHALATION_SPRAY | Freq: Two times a day (BID) | RESPIRATORY_TRACT | 1 refills | Status: DC

## 2019-06-07 MED ORDER — TORSEMIDE 20 MG PO TABS: 40.0000 mg | ORAL_TABLET | Freq: Two times a day (BID) | ORAL | 0 refills | Status: DC

## 2019-06-07 MED ORDER — POTASSIUM CHLORIDE CRYS ER 20 MEQ PO TBCR
40.0000 meq | EXTENDED_RELEASE_TABLET | Freq: Once | ORAL | Status: AC
  Administered 2019-06-07: 09:00:00 40 meq via ORAL
  Filled 2019-06-07: qty 4

## 2019-06-07 MED ORDER — ALBUTEROL SULFATE (2.5 MG/3ML) 0.083% IN NEBU: 2.5000 mg | INHALATION_SOLUTION | RESPIRATORY_TRACT | 12 refills | Status: DC | PRN

## 2019-06-07 MED ORDER — CARVEDILOL 3.125 MG PO TABS: 3.1250 mg | ORAL_TABLET | Freq: Two times a day (BID) | ORAL | 0 refills | Status: DC

## 2019-06-07 MED FILL — ASPIRIN LOW DOSE 81 MG CHEW: 81 | 30 days supply | Qty: 30 | Fill #0

## 2019-06-07 MED FILL — DULERA 100 MCG/5 MCG INH: 100-5 | 30 days supply | Qty: 13 | Fill #0

## 2019-06-07 MED FILL — TORSEMIDE 20 MG TABLET: 20 | 30 days supply | Qty: 120 | Fill #0

## 2019-06-07 MED FILL — SPIRONOLACTONE 25 MG TABLET: 25 | 30 days supply | Qty: 30 | Fill #0

## 2019-06-07 MED FILL — SPIRIVA 18 MCG CP-HANDIHALE: 18 | 30 days supply | Qty: 30 | Fill #0

## 2019-06-07 MED FILL — LOSARTAN POTASSIUM 50 MG TA: 50 | 30 days supply | Qty: 30 | Fill #0

## 2019-06-07 MED FILL — ALBUTEROL SUL 2.5 MG/3 ML S: (2.5 MG/3ML | 10 days supply | Qty: 180 | Fill #0

## 2019-06-07 MED FILL — CARVEDILOL 3.125 MG TABLET: 3.125 | 30 days supply | Qty: 60 | Fill #0

## 2019-06-07 MED FILL — POTASSIUM CHL ER M10 TABLET: 10 | 30 days supply | Qty: 30 | Fill #0

## 2019-06-07 NOTE — Telephone Encounter (Signed)
Pt currently admitted.

## 2019-06-07 NOTE — Progress Notes (Signed)
Progress Note  Patient Name: Justin Rasmussen Date of Encounter: 06/07/2019  Primary Cardiologist: New (Andrell Bergeson)  Subjective   Feels well, walked without dyspnea or dizziness. In/out close to net neutral on PO diuretic. Weight stable at 297 lb (135 kg). cMRI without LGE/scar. Fewer episodes of SVT on monitor.  Inpatient Medications    Scheduled Meds: . aspirin  81 mg Oral Daily  . carvedilol  3.125 mg Oral BID WC  . enoxaparin (LOVENOX) injection  80 mg Subcutaneous Q24H  . furosemide  80 mg Oral BID  . ipratropium-albuterol  3 mL Nebulization BID  . losartan  50 mg Oral Daily  . mometasone-formoterol  2 puff Inhalation BID  . sodium chloride flush  3 mL Intravenous Q12H  . sodium chloride flush  3 mL Intravenous Q12H  . spironolactone  25 mg Oral Daily   Continuous Infusions: . sodium chloride     PRN Meds: sodium chloride, acetaminophen, albuterol, ondansetron (ZOFRAN) IV, sodium chloride flush   Vital Signs    Vitals:   06/06/19 2000 06/07/19 0417 06/07/19 0623 06/07/19 0755  BP: 103/66 117/78  96/64  Pulse: 84 90  85  Resp: 20 20    Temp: 99.4 F (37.4 C) 98.4 F (36.9 C)  98.4 F (36.9 C)  TempSrc: Oral Oral  Oral  SpO2: 96% 95%  94%  Weight:   134.9 kg   Height:        Intake/Output Summary (Last 24 hours) at 06/07/2019 0819 Last data filed at 06/07/2019 0816 Gross per 24 hour  Intake 1659 ml  Output 2050 ml  Net -391 ml   Last 3 Weights 06/07/2019 06/06/2019 06/05/2019  Weight (lbs) 297 lb 4.8 oz 297 lb 14.4 oz 302 lb 8 oz  Weight (kg) 134.854 kg 135.127 kg 137.213 kg      Telemetry    NSR with 4-5 episodes of 10-15 beat PAT, very frequent PACs/couplets - Personally Reviewed  ECG    No ne tracing - Personally Reviewed  Physical Exam  Morbidly obese GEN: No acute distress.   Neck: No JVD Cardiac: RRR, no murmurs, rubs, or gallops.  Respiratory: Clear to auscultation bilaterally. GI: Soft, nontender, non-distended  MS: No edema; No  deformity. Neuro:  Nonfocal  Psych: Normal affect   Labs    High Sensitivity Troponin:   Recent Labs  Lab 06/03/19 0937  TROPONINIHS 38*      Chemistry Recent Labs  Lab 06/01/19 1630  06/03/19 0514 06/04/19 0709 06/05/19 0547 06/05/19 0753 06/05/19 0800 06/07/19 0443  NA 142   < > 145 141 140 139 140 140  K 3.6   < > 3.5 3.5 3.6 3.6 3.6 3.4*  CL 96*   < > 91* 93* 95*  --   --  96*  CO2 35*   < > 44* 37* 36*  --   --  35*  GLUCOSE 164*   < > 109* 104* 109*  --   --  109*  BUN 18   < > 12 14 18   --   --  21  CREATININE 0.92   < > 0.97 0.92 1.04  --   --  0.81  CALCIUM 8.8*   < > 8.6* 8.7* 8.6*  --   --  8.2*  PROT 6.4*  --  5.7*  --   --   --   --   --   ALBUMIN 3.9  --  3.5  --   --   --   --  3.1*  AST 24  --  24  --   --   --   --   --   ALT 28  --  25  --   --   --   --   --   ALKPHOS 64  --  54  --   --   --   --   --   BILITOT 0.8  --  1.2  --   --   --   --   --   GFRNONAA >60   < > >60 >60 >60  --   --  >60  GFRAA >60   < > >60 >60 >60  --   --  >60  ANIONGAP 11   < > 10 11 9   --   --  9   < > = values in this interval not displayed.     Hematology Recent Labs  Lab 06/02/19 0000 06/03/19 0514 06/05/19 0547 06/05/19 0753 06/05/19 0800  WBC 9.7 7.6 9.1  --   --   RBC 5.14 4.94 5.17  --   --   HGB 15.2 14.6 14.7 15.6 16.3  HCT 48.3 46.5 47.7 46.0 48.0  MCV 94.0 94.1 92.3  --   --   MCH 29.6 29.6 28.4  --   --   MCHC 31.5 31.4 30.8  --   --   RDW 14.4 14.4 14.3  --   --   PLT 167 150 168  --   --     BNP Recent Labs  Lab 06/01/19 1630  BNP 322.5*     DDimer No results for input(s): DDIMER in the last 168 hours.   Radiology    Mr Cardiac Morphology W Wo Contrast  Result Date: 06/06/2019 CLINICAL DATA:  Cardiomyopathy of uncertain etiology. EXAM: CARDIAC MRI TECHNIQUE: The patient was scanned on a 1.5 Tesla GE magnet. A dedicated cardiac coil was used. Functional imaging was done using Fiesta sequences. 2,3, and 4 chamber views were done to  assess for RWMA's. Modified Simpson's rule using a short axis stack was used to calculate an ejection fraction on a dedicated work 06/08/2019. The patient received 8 cc of Gadavist. After 10 minutes inversion recovery sequences were used to assess for infiltration and scar tissue. FINDINGS: Low lung volumes and basilar atelectasis. Technicallly difficult study due to respiratory motion. Mildly dilated left ventricle with mild LV hypertrophy. Diffuse hypokinesis with septal bounce noted, suspect this is due to RV pressure/volume overload. EF calculated 16% but visually appears in the 25% range (difficult images for quantification due to motion artifact). Moderately dilated right ventricle with severely decreased systolic function, EF 25% (visually more moderate dysfunction, again difficult quantification due to motion artifact). Mild left and right atrial enlargement. Trileaflet aortic valve with no regurgitation or stenosis. No significant mitral regurgitation noted. On delayed enhancement imaging, there was no myocardial late gadolinium enhancement (LGE). Measurements: LVEDV 231 mL LVSV 38 mL LVEF 16% RVEDV 265 mL RVSV 65 mL RVEF 25% IMPRESSION: 1. Mildly dilated LV with mild LV hypertrophy. EF calculated 16% but looks in the 25% range visually (difficult study due to motion artifact). Septal bounce with diffuse hypokinesis, suspect RV pressure/volume overload. 2. Moderately dilated RV with severely decreased systolic function, EF 25% (visually more moderate dysfunction). 3. No myocardial LGE so no definitive evidence for prior MI, myocarditis, or infiltrative disease. Dalton Mclean Electronically Signed   By: Research officer, [employer] M.D.   On: 06/06/2019 14:55  Cardiac Studies   Cardiac catheterization 06/05/2019  Mid LAD lesion is 10% stenosed.  Prox RCA lesion is 10% stenosed.  LV end diastolic pressure is moderately elevated.  There is no aortic valve stenosis.  Hemodynamic findings  consistent with moderate pulmonary hypertension.  Ao sat 97%, PA sat 65%, mean PA pressure 44 mm Hg; mean PCWP 29 mm Hg; CO 5.1 L/min; CI 2.08  Nonischemic cardiomyopathy.  Moderate pulmonary HTN. Fick Cardiac Output 5.15 L/min  Fick Cardiac Output Index 2.08 (L/min)/BSA  RA A Wave 19 mmHg  RA V Wave 15 mmHg  RA Mean 16 mmHg  RV Systolic Pressure 51 mmHg  RV Diastolic Pressure 15 mmHg  RV EDP 18 mmHg  PA Systolic Pressure 54 mmHg  PA Diastolic Pressure 29 mmHg  PA Mean 44 mmHg  PW A Wave 31 mmHg  PW V Wave 33 mmHg  PW Mean 29 mmHg  AO Systolic Pressure 119 mmHg  AO Diastolic Pressure 83 mmHg  AO Mean 98 mmHg  LV Systolic Pressure 127 mmHg  LV Diastolic Pressure 18 mmHg  LV EDP 29 mmHg  AOp Systolic Pressure 121 mmHg  AOp Diastolic Pressure 79 mmHg  AOp Mean Pressure 99 mmHg  LVp Systolic Pressure 121 mmHg  LVp Diastolic Pressure 21 mmHg  LVp EDP Pressure 27 mmHg  QP/QS 1  TPVR Index 21.16 HRUI  TSVR Index 47.13 HRUI  PVR SVR Ratio 0.18  TPVR/TSVR Ratio 0.45   Echocardiogram 06/02/2019 1. Left ventricular ejection fraction, by visual estimation, is 30 to 35%. The left ventricle has severely decreased function. Left ventricular[size -correction]was moderately increased. There is no left ventricular hypertrophy. 2. Entire lateral wall, entire anterior wall, and apex are abnormal. 3. Elevated mean left atrial pressure. 4. Left ventricular diastolic Doppler parameters are consistent with pseudonormalization pattern of LV diastolic filling. 5. Global right ventricle has mildly reduced systolic function.The right ventricular size is severely enlarged. No increase in right ventricular wall thickness. 6. Left atrial size was moderately dilated. 7. Right atrial size was moderately dilated. 8. Mild mitral annular calcification. 9. The mitral valve is normal in structure. Trace mitral valve regurgitation. 10. The tricuspid valve is normal in structure. Tricuspid  valve regurgitation is trivial. 11. The aortic valve is tricuspid Aortic valve regurgitation was not visualized by color flow Doppler. Mild aortic valve sclerosis without stenosis. 12. The pulmonic valve was not well visualized. Pulmonic valve regurgitation is not visualized by color flow Doppler. 13. Moderately elevated pulmonary artery systolic pressure. 14. The inferior vena cava is dilated in size with <50% respiratory variability, suggesting right atrial pressure of 15 mmHg.  cMRI 06/06/2019 1. Mildly dilated LV with mild LV hypertrophy. EF calculated 16% but looks in the 25% range visually (difficult study due to motion artifact). Septal bounce with diffuse hypokinesis, suspect RV pressure/volume overload.  2. Moderately dilated RV with severely decreased systolic function, EF 25% (visually more moderate dysfunction).  3. No myocardial LGE so no definitive evidence for prior MI, myocarditis, or infiltrative disease.  Patient Profile     63 y.o. male with new onset biventricular heart failure secondary to nonischemic cardiomyopathy, moderate pulmonary artery hypertension due to both left heart failure and cor pulmonale, probable obesity hypoventilation syndrome/obstructive sleep apnea, morbid obesity  Assessment & Plan    1. VOP:[id number] of left heart dysfunction is unclear. Consider primary dilated CMP versus tachycardia related CMP due to frequent PAT. "Dry weight"estimate 297 lb. Discussed again sodium restriction, daily weight, signs/sxs of CHF. Recheck echo after 3  months of medical therapy. Avoid alcohol. 2. Acute on chronic cor pulmonale: high suspicion for chronic cor pulmonale due to OSA and obesity-hypoventilation sd.Needs sleep study as an outpatient. 3.PAT: cause-effect relationship with CMP is uncertain. Reevaluate with an arrhythmia monitor after we have titrated the beta blocker to the max tolerated dose. 4. Morbid obesity/preDM [facility] will sign off.     Medication Recommendations:   - furosemide 80 mg twice daily - spironolactone 25 mg daily - losartan 50 mg daily - carvedilol 3.125 mg BID [facility] - daily weight, call MD if weight increases > 3 lb in 24h or 5 lb in one week Other recommendations (labs, testing, etc):   - OP sleep study - 48 h Holter monitor (to be done after we increase beta blocker at F/U visit) - echo in 3 months Follow up as an outpatient:  Will arrange TOC in 1-2 weeks. Bring weight and BP log. BMET at that appt.  For questions or updates, please contact [facility] Please consult [email or web address] for contact info under        Signed, Thurmon FairMihai Vanessia Bokhari, MD  06/07/2019, 8:19 AM

## 2019-06-07 NOTE — Plan of Care (Signed)
?  Problem: Education: ?Goal: Knowledge of General Education information will improve ?Description: Including pain rating scale, medication(s)/side effects and non-pharmacologic comfort measures ?Outcome: Progressing ?  ?Problem: Activity: ?Goal: Capacity to carry out activities will improve ?Outcome: Progressing ?  ?

## 2019-06-07 NOTE — Discharge Summary (Signed)
Physician Discharge Summary  Patient ID: [place] I[place] MRN: [id number] DOB/AGE: 1956-08-02 63 y.o.  Admit date: 06/01/2019 Discharge date: 06/07/2019  Admission Diagnoses:  Discharge Diagnoses:  Principal Problem:    Acute chronic combined systolic and diastolic congestive heart failure)   Acute on chronic combined hypoxic and hypercapnic respiratory failure Active Problems:   Nonischemic cardiomyopathy with EF of 30 to 35%    Likely undiagnosed OSA/OHS   Morbid obesity (HCC)   Elevated hemidiaphragm-Rt   RBBB   Cor pulmonale (chronic) (HCC)   Discharged Condition: stable.  Patient will be discharged on supplemental oxygen via nasal cannula at 3 L/min.  Hospital Course: Patient is a 63 year old Caucasian male, morbidly obese, with past medical history significant for documented COPD.  Patient may have previously undiagnosed obstructive sleep apnea and obesity hypoventilation syndrome.  Patient was admitted with worsening shortness of breath and bilateral lower extremity edema.  On presentation to the hospital, patient was hypoxic, and the chest x-ray revealed right-sided effusion with underlying opacity representing atelectasis versus infiltrate.  Patient was admitted for further assessment and management.  Patient was aggressively diuresed.  Patient was initially managed with IV Lasix 80 mg twice daily.  Edema and shortness of breath have improved significantly.  Echocardiogram done revealed EF of 30 to 35% and RVSP of 45 mmHg.  Cardiology team was consulted to assist with patient's management.  Patient underwent cardiac catheterization that revealed nonobstructive coronary arteries.  Patient has been optimized and will be discharged to the care of the primary care provider.  Other questioning, patient endorsed symptoms suggestive of likely obstructive sleep apnea.  Need to pursue sleep studies on discharge was discussed extensively with patient and patient's wife.  Patient will be discharged  back home on oral diuretics.  Patient will be on supplemental potassium chloride 10 MDQ p.o. once daily on discharge.  Kindly repeat basic metabolic profile within 1 week of discharge.  Primary care provider and cardiology team to review the basic metabolic profile and manage patient's medications accordingly.  Patient will be discharged back on to the care of the primary care provider, cardiology and pulmonary team.  Patient will need follow-up pulmonary team on discharge.    Consults: cardiology  Significant Diagnostic Studies: Cardiac catheterization.  Nonobstructive coronary arteries were noted.  Discharge Exam: Blood pressure 106/65, pulse 94, temperature 98.4 F (36.9 C), temperature source Oral, resp. rate 20, height 5\' 9"  (1.753 m), weight 134.9 kg, SpO2 94 %.  Disposition: Discharge disposition: 01-Home or Self Care  Discharge Instructions    Diet - low sodium heart healthy   Complete by: As directed    For home use only DME Nebulizer machine   Complete by: As directed    Patient needs a nebulizer to treat with the following condition: Respiratory failure (HCC)   Length of Need: 12 Months   Increase activity slowly   Complete by: As directed      Allergies as of 06/07/2019   No Known Allergies     Medication List    STOP taking these medications   acetaminophen 500 MG tablet Commonly known as: TYLENOL   ibuprofen 200 MG tablet Commonly known as: ADVIL     TAKE these medications   albuterol (2.5 MG/3ML) 0.083% nebulizer solution Commonly known as: PROVENTIL Take 3 mLs (2.5 mg total) by nebulization every 4 (four) hours as needed for wheezing or shortness of breath.   aspirin 81 MG chewable tablet Chew 1 tablet (81 mg total) by mouth daily. Start  taking on: June 08, 2019   carvedilol 3.125 MG tablet Commonly known as: COREG Take 1 tablet (3.125 mg total) by mouth 2 (two) times daily with a meal.   cetirizine 10 MG tablet Commonly known as: ZYRTEC Take 10 mg  by mouth daily as needed for allergies or rhinitis.   losartan 50 MG tablet Commonly known as: COZAAR Take 1 tablet (50 mg total) by mouth daily. Start taking on: June 08, 2019   mometasone-formoterol 100-5 MCG/ACT Aero Commonly known as: DULERA Inhale 2 puffs into the lungs 2 (two) times daily.   potassium chloride 10 MEQ tablet Commonly known as: KLOR-CON Take 1 tablet (10 mEq total) by mouth daily.   Spiriva HandiHaler 18 MCG inhalation capsule Generic drug: tiotropium Place 1 capsule (18 mcg total) into inhaler and inhale daily.   spironolactone 25 MG tablet Commonly known as: ALDACTONE Take 1 tablet (25 mg total) by mouth daily. Start taking on: June 08, 2019   torsemide 20 MG tablet Commonly known as: Demadex Take 2 tablets (40 mg total) by mouth 2 (two) times daily.            Durable Medical Equipment  (From admission, onward)         Start     Ordered   06/07/19 0000  For home use only DME Nebulizer machine    Question Answer Comment  Patient needs a nebulizer to treat with the following condition Respiratory failure (HCC)   Length of Need 12 Months      06/07/19 1128   06/06/19 1633  For home use only DME oxygen  Once    Question Answer Comment  Length of Need 12 Months   Mode or (Route) Nasal cannula   Liters per Minute 3   Frequency Continuous (stationary and portable oxygen unit needed)   Oxygen delivery system Gas      06/06/19 1632         Follow-up Information    Sharlene Dory, DO Follow up on 06/05/2019.   Specialty: Family Medicine Why: 2:20 for follow up  Contact information: [address] [place] [address] [phone]        Marcelino Duster, PA Follow up.   Specialties: Physician Assistant, Cardiology, Radiology Why: Cardiology hospital follow up on 06/19/2019 at 10:45. Please arrive 15 minutes early for check in.  Contact information: [address] [place]  [address] [phone]           Signed: Barnetta Chapel 06/07/2019, 11:29 AM

## 2019-06-07 NOTE — Progress Notes (Signed)
  Mobility Specialist Criteria Algorithm Info.  SATURATION QUALIFICATIONS: (This note is used to comply with regulatory documentation for home oxygen)  Patient Saturations on Room Air at Rest = 83%  Patient Saturations on Room Air while Ambulating = N/A%  Patient Saturations on 3 Liters of oxygen while Ambulating = 94%  Please briefly explain why patient needs home oxygen: Patient needed 3LO2 to maintain an oxygen saturation <90%. Initially was using 2LO2 but was saturating between 88-89%  Mobility:  Activity: Ambulated in hall;Transferred:  Bed to chair;Dangled on edge of bed Range of motion: Active;All extremities Level of assistance: Independent Assistive device: None Minutes stood: 5 minutes Minutes ambulated: 5 minutes Distance ambulated (ft): 500 ft Mobility response: Tolerated well Bed Position: Chair Mobility Status: Yes, no lift needed   06/07/2019 12:30 PM

## 2019-06-07 NOTE — Telephone Encounter (Signed)
TOC Patient- Please call Patient- Pt have an appointment oon 06-19-19

## 2019-06-08 ENCOUNTER — Telehealth: Payer: Self-pay | Admitting: Cardiovascular Disease

## 2019-06-08 NOTE — Telephone Encounter (Signed)
Patient's wife contacted regarding discharge from [facility] on 06/07/19.  Patient understands to follow up with provider DUKES on 06/19/19 at 10:45 at [facility]. Patient understands discharge instructions? yes  WIFE STATES PATIENT did not receive heart failure booklet at discharge from hosptial-RN  Informed wife will contact [facility] to see if  Booklet can be picked up.  RN spoke with charge nurse - she states the patient can come a pick up a copy - please as ask for [place].   wife is aware  .Patient understands medications and regiment? yes   Patient understands to bring all medications to this visit? Yes- WEAR A FACE COVERING , NO OTHER VISITOR

## 2019-06-08 NOTE — Telephone Encounter (Signed)
Left a message for the patient to call back.  

## 2019-06-08 NOTE — Telephone Encounter (Signed)
New Message:   Wife called and said pt is trying to get Chiropractor. The lady at the hospital asked if the pt would be out of work for the next 12 months. Please ask Dr C, if he thinks pt will be out of work that long please.

## 2019-06-08 NOTE — Telephone Encounter (Signed)
The patient's wife called back. He gave verbal permission to speak to his wife since she is not on the dpr.  She stated that the patient has not worked in the last 8-9 years due to a physical disability. 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  Due to not working for the past 8-9 years the patient cannot get disability and needs aid in paying the hospital bill. A social worker from the hospital needs to know if the patient will be able to work in the next 12 months. If he cannot, then they may be able to get financial help for the patient.

## 2019-06-11 NOTE — Telephone Encounter (Signed)
He will not be able to work in the next 12 months

## 2019-06-11 NOTE — Telephone Encounter (Signed)
Left a message for the patient to call back.  

## 2019-06-12 NOTE — Telephone Encounter (Signed)
The patient's wife has been made aware and will call back if anything further is needed

## 2019-06-18 NOTE — Progress Notes (Signed)
Cardiology Office Note:    Date:  06/19/2019   ID:  Justin Rasmussen, DOB 1956-01-16, MRN [id number]  PCP:  Patient, No Pcp Per  Cardiologist:  [name], MD   Referring MD: No ref. provider found   Shortness of breath and congestive heart failure  History of Present Illness:    Justin Rasmussen is a 63 y.o. male with a hx of acute systolic congestive heart failure, cardiomyopathy of unknown etiology, RBBB, cor pulmonale chronic, elevated hemidiaphragm, and morbid obesity.  He was admitted [facility] on 06/01/2019 with increased dyspnea on exertion over the prior 2 to 3 weeks.  He underwent cardiac catheterization on 06/05/2019 which showed nonischemic cardiomyopathy and moderate pulmonary hypertension.  A cardiac MRI on 06/06/2019 showed mildly dilated left ventricle with mild left ventricular hypertrophy and an EF calculated at 16% however, by visual estimate looks closer to 25%, diffuse hypokinesis moderately dilated right ventricle with severely decreased systolic function EF 25% and no myocardial LGE (no definitive evidence of prior MI, myocarditis, or infiltrative disease).  His moderate pulmonary hypertension is thought to be due to his left heart failure and cor pulmonale, obesity hypoventilation syndrome/OSA and his morbid obesity.  During that time he was also having episodes of PAT which were thought to be contributing to his heart failure as well.    He presents the clinic today and states he feels much better since discharge.  His weight has been consistent over the last few days around 293- 294 pounds at home and he is 293.4 pounds in the clinic today.  He states he has been able to slowly increase his physical activity and do chores around his house.  However, he states he  is limited due to the oxygen he is carrying around.  He continues to use 3 L of oxygen with activity but states he has been able to titrate the oxygen down to 2.5 L when he is inactive.  He denies chest pain, increased  shortness of breath, lower extremity edema, fatigue, palpitations, melena, hematuria, hemoptysis, diaphoresis, weakness, presyncope, syncope, orthopnea, and PND.  Past Medical History:  Diagnosis Date  . COPD (chronic obstructive pulmonary disease) (HCC)     Past Surgical History:  Procedure Laterality Date  . RIGHT/LEFT HEART CATH AND CORONARY ANGIOGRAPHY N/A 06/05/2019   Procedure: RIGHT/LEFT HEART CATH AND CORONARY ANGIOGRAPHY;  Surgeon: Corky Crafts, MD;  Location: [facility] INVASIVE CV LAB;  Service: Cardiovascular;  Laterality: N/A;    Current Medications: Current Meds  Medication Sig  . albuterol (PROVENTIL) (2.5 MG/3ML) 0.083% nebulizer solution Take 3 mLs (2.5 mg total) by nebulization every 4 (four) hours as needed for wheezing or shortness of breath.  Marland Kitchen aspirin 81 MG chewable tablet Chew 1 tablet (81 mg total) by mouth daily.  . carvedilol (COREG) 6.25 MG tablet Take 1 tablet (6.25 mg total) by mouth 2 (two) times daily with a meal.  . cetirizine (ZYRTEC) 10 MG tablet Take 10 mg by mouth daily as needed for allergies or rhinitis.  Marland Kitchen losartan (COZAAR) 25 MG tablet Take 1 tablet (25 mg total) by mouth daily.  . mometasone-formoterol (DULERA) 100-5 MCG/ACT AERO Inhale 2 puffs into the lungs 2 (two) times daily.  . potassium chloride (KLOR-CON) 10 MEQ tablet Take 1 tablet (10 mEq total) by mouth daily.  Marland Kitchen spironolactone (ALDACTONE) 25 MG tablet Take 1 tablet (25 mg total) by mouth daily.  Marland Kitchen tiotropium (SPIRIVA HANDIHALER) 18 MCG inhalation capsule Place 1 capsule (18 mcg total) into inhaler and inhale  daily.  . torsemide (DEMADEX) 20 MG tablet Take 2 tablets (40 mg total) by mouth 2 (two) times daily.  . [DISCONTINUED] carvedilol (COREG) 3.125 MG tablet Take 1 tablet (3.125 mg total) by mouth 2 (two) times daily with a meal.  . [DISCONTINUED] losartan (COZAAR) 50 MG tablet Take 1 tablet (50 mg total) by mouth daily.     Allergies:   Patient has no known allergies.   Social  History   Socioeconomic History  . Marital status: Married    Spouse name: Not on file  . Number of children: Not on file  . Years of education: Not on file  . Highest education level: Not on file  Occupational History  . Not on file  Social Needs  . Financial resource strain: Not on file  . Food insecurity    Worry: Not on file    Inability: Not on file  . Transportation needs    Medical: Not on file    Non-medical: Not on file  Tobacco Use  . Smoking status: Former Smoker    Types: Cigarettes    Quit date: 05/31/1989    Years since quitting: 30.0  . Smokeless tobacco: Never Used  Substance and Sexual Activity  . Alcohol use: Not Currently  . Drug use: Not on file  . Sexual activity: Not on file  Lifestyle  . Physical activity    Days per week: Patient refused    Minutes per session: Patient refused  . Stress: Only a little  Relationships  . Social Musician on phone: Not on file    Gets together: Not on file    Attends religious service: Not on file    Active member of club or organization: Not on file    Attends meetings of clubs or organizations: Not on file    Relationship status: Married  Other Topics Concern  . Not on file  Social History Narrative  . Not on file     Family History: The patient' sfamily history is not on file.  ROS:   Please see the history of present illness.    All other systems reviewed and are negative.  EKGs/Labs/Other Studies Reviewed:    The following studies were reviewed today: Cardiac catheterization 06/04/2017  Mid LAD lesion is 10% stenosed.  Prox RCA lesion is 10% stenosed.  LV end diastolic pressure is moderately elevated.  There is no aortic valve stenosis.  Hemodynamic findings consistent with moderate pulmonary hypertension.  Ao sat 97%, PA sat 65%, mean PA pressure 44 mm Hg; mean PCWP 29 mm Hg; CO 5.1 L/min; CI 2.08   Nonischemic cardiomyopathy.  Moderate pulmonary HTN.  Echocardiogram  06/02/2019 IMPRESSIONS    1. Left ventricular ejection fraction, by visual estimation, is 30 to 35%. The left ventricle has severely decreased function. Left ventricular septal wall thickness was moderately increased. There is no left ventricular hypertrophy.  2. Entire lateral wall, entire anterior wall, and apex are abnormal.  3. Elevated mean left atrial pressure.  4. Left ventricular diastolic Doppler parameters are consistent with pseudonormalization pattern of LV diastolic filling.  5. Global right ventricle has mildly reduced systolic function.The right ventricular size is severely enlarged. No increase in right ventricular wall thickness.  6. Left atrial size was moderately dilated.  7. Right atrial size was moderately dilated.  8. Mild mitral annular calcification.  9. The mitral valve is normal in structure. Trace mitral valve regurgitation. 10. The tricuspid valve is normal in structure.  Tricuspid valve regurgitation is trivial. 11. The aortic valve is tricuspid Aortic valve regurgitation was not visualized by color flow Doppler. Mild aortic valve sclerosis without stenosis. 12. The pulmonic valve was not well visualized. Pulmonic valve regurgitation is not visualized by color flow Doppler. 13. Moderately elevated pulmonary artery systolic pressure. 14. The inferior vena cava is dilated in size with <50% respiratory variability, suggesting right atrial pressure of 15 mmHg.  Cardiac MRI 06/05/2019 IMPRESSION: 1. Mildly dilated LV with mild LV hypertrophy. EF calculated 16% but looks in the 25% range visually (difficult study due to motion artifact). Septal bounce with diffuse hypokinesis, suspect RV pressure/volume overload.  2. Moderately dilated RV with severely decreased systolic function, EF 25% (visually more moderate dysfunction).  3. No myocardial LGE so no definitive evidence for prior MI, myocarditis, or infiltrative disease.  EKG:  EKG is not ordered today.      EKG 06/01/2019 Sinus tachycardia with right bundle branch block 112 bpm  Recent Labs: 06/01/2019: B Natriuretic Peptide 322.5 06/03/2019: ALT 25 06/04/2019: TSH 1.966 06/05/2019: Hemoglobin 16.3; Platelets 168 06/07/2019: BUN 21; Creatinine, Ser 0.81; Magnesium 2.0; Potassium 3.4; Sodium 140  Recent Lipid Panel    Component Value Date/Time   CHOL 154 06/06/2019 0457   TRIG 76 06/06/2019 0457   HDL 34 (L) 06/06/2019 0457   CHOLHDL 4.5 06/06/2019 0457   VLDL 15 06/06/2019 0457   LDLCALC 105 (H) 06/06/2019 0457    Physical Exam:    VS:  BP 112/62   Pulse 82   Ht 5\' 9"  (1.753 m)   Wt 293 lb 6.4 oz (133.1 kg)   SpO2 98%   BMI 43.33 kg/m     Wt Readings from Last 3 Encounters:  06/19/19 293 lb 6.4 oz (133.1 kg)  06/07/19 297 lb 4.8 oz (134.9 kg)     GEN:  Well nourished, well developed in no acute distress HEENT: Normal NECK: No JVD; No carotid bruits LYMPHATICS: No lymphadenopathy CARDIAC:RRR, no murmurs, rubs, gallops RESPIRATORY:  Clear to auscultation without rales, wheezing or rhonchi  ABDOMEN: Soft, non-tender, non-distended MUSCULOSKELETAL:  No edema; No deformity  SKIN: Warm and dry NEUROLOGIC:  Alert and oriented x 3 PSYCHIATRIC:  Normal affect   ASSESSMENT/ Plan   Congestive heart failure- less short of breath today, still using 3 L of oxygen with activity but able to titrate to 2.5 L when active.  Weight today is 293.4 down from 302.8 pounds on 06/05/2019.  He has been able to increase his physical activity. Continue torsemide  40 mg tablet twice daily Continue spironolactone 25 mg tablet daily Decreased losartan to 25 mg  tablet daily-decreased here to increase beta-blocker. Increase carvedilol to 6.25 mg twice daily  Continue Daily weights-call clinic with weight increase greater than 3 pounds in 24 hours or 5 pounds in 1 week. heart healthy low-sodium diet-salty 6 given Repeat echocardiogram in 3 months Order BMP today  Acute on chronic cor pulmonale- may  be due to OSA and obesity-hypoventilation syndrome. Order sleep study  Paroxysmal atrial tachycardia- heart rate today 82.  Has been in the 70s and 80s since discharge Increase carvedilol to 6.25 mg twice daily  In the future order 48-hour Holter monitor- titrating up beta-blocker  Morbid obesity-weight today 293.4 pounds Continue torsemide  40 mg tablet twice daily Heart healthy low-sodium diet Continue weight loss Increase physical activity as tolerated In order of problems listed above:  Disposition: Follow-up with [name] after sleep study.  OSA (obstructive sleep apnea) -  Plan: Basic metabolic panel, Split night study  Acute systolic congestive heart failure (HCC) - Plan: Basic metabolic panel  Morbid obesity (HCC) - Plan: Basic metabolic panel   Medication Adjustments/Labs and Tests Ordered: Current medicines are reviewed at length with the patient today.  Concerns regarding medicines are outlined above.  Orders Placed This Encounter  Procedures  . Basic metabolic panel  . Split night study   Meds ordered this encounter  Medications  . carvedilol (COREG) 6.25 MG tablet    Sig: Take 1 tablet (6.25 mg total) by mouth 2 (two) times daily with a meal.    Dispense:  60 tablet    Refill:  3  . losartan (COZAAR) 25 MG tablet    Sig: Take 1 tablet (25 mg total) by mouth daily.    Dispense:  30 tablet    Refill:  3    Purvis Sheffield NP-C 06/19/2019 12:42 PM    [facility]

## 2019-06-19 ENCOUNTER — Encounter: Payer: Self-pay | Admitting: Physician Assistant

## 2019-06-19 ENCOUNTER — Other Ambulatory Visit: Payer: Self-pay

## 2019-06-19 ENCOUNTER — Ambulatory Visit (INDEPENDENT_AMBULATORY_CARE_PROVIDER_SITE_OTHER): Payer: Self-pay | Admitting: General Practice

## 2019-06-19 VITALS — BP 112/62 | HR 82 | Ht 69.0 in | Wt 293.4 lb

## 2019-06-19 DIAGNOSIS — I471 Supraventricular tachycardia: Secondary | ICD-10-CM

## 2019-06-19 DIAGNOSIS — G4733 Obstructive sleep apnea (adult) (pediatric): Secondary | ICD-10-CM

## 2019-06-19 DIAGNOSIS — I5021 Acute systolic (congestive) heart failure: Secondary | ICD-10-CM

## 2019-06-19 MED ORDER — CARVEDILOL 6.25 MG PO TABS: 6.2500 mg | ORAL_TABLET | Freq: Two times a day (BID) | ORAL | 3 refills | Status: DC

## 2019-06-19 MED ORDER — LOSARTAN POTASSIUM 25 MG PO TABS: 25.0000 mg | ORAL_TABLET | Freq: Every day | ORAL | 3 refills | Status: DC

## 2019-06-19 NOTE — Patient Instructions (Signed)
Medication Instructions:  Increase Carvedilol to 6.25 mg twice daily.  Decrease Losartan to 25 mg daily.  If you need a refill on your cardiac medications before your next appointment, please call your pharmacy.   Lab work: Your physician recommends that you return for lab work today: BMET  If you have labs (blood work) drawn today and your tests are completely normal, you will receive your results only by: Marland Kitchen MyChart Message (if you have MyChart) OR . A paper copy in the mail If you have any lab test that is abnormal or we need to change your treatment, we will call you to review the results.  Testing/Procedures: Your physician has recommended that you have a sleep study. This test records several body functions during sleep, including: brain activity, eye movement, oxygen and carbon dioxide blood levels, heart rate and rhythm, breathing rate and rhythm, the flow of air through your mouth and nose, snoring, body muscle movements, and chest and belly movement.  Follow-Up: At [facility], you and your health needs are our priority.  As part of our continuing mission to provide you with exceptional heart care, we have created designated Provider Care Teams.  These Care Teams include your primary Cardiologist (physician) and Advanced Practice Providers (APPs -  Physician Assistants and Nurse Practitioners) who all work together to provide you with the care you need, when you need it. . Please schedule a follow-up appointment to see Coletta Memos, NP in 2-3 weeks.  Any Other Special Instructions Will Be Listed Below (If Applicable). Please review the information given to you today called "[facility]"

## 2019-06-20 LAB — BASIC METABOLIC PANEL
BUN/Creatinine Ratio: 18 (ref 10–24)
BUN: 18 mg/dL (ref 8–27)
CO2: 36 mmol/L — ABNORMAL HIGH (ref 20–29)
Calcium: 9.4 mg/dL (ref 8.6–10.2)
Chloride: 92 mmol/L — ABNORMAL LOW (ref 96–106)
Creatinine, Ser: 1.02 mg/dL (ref 0.76–1.27)
GFR calc Af Amer: 91 mL/min/{1.73_m2} (ref >59)
GFR calc non Af Amer: 79 mL/min/{1.73_m2} (ref >59)
Glucose: 94 mg/dL (ref 65–99)
Potassium: 4.3 mmol/L (ref 3.5–5.2)
Sodium: 142 mmol/L (ref 134–144)

## 2019-06-21 ENCOUNTER — Telehealth: Payer: Self-pay | Admitting: *Deleted

## 2019-06-21 NOTE — Telephone Encounter (Signed)
Left sleep study and COVID test dates and instructions on voice mail.

## 2019-06-23 NOTE — Progress Notes (Signed)
Thanks Jesse!

## 2019-06-30 ENCOUNTER — Other Ambulatory Visit (HOSPITAL_COMMUNITY)
Admission: RE | Admit: 2019-06-30 | Discharge: 2019-06-30 | Disposition: A | Discharge status: Home / Self Care | Payer: Self-pay | Source: Ambulatory Visit | Attending: Cardiovascular Disease | Admitting: Cardiovascular Disease

## 2019-06-30 DIAGNOSIS — Z01812 Encounter for preprocedural laboratory examination: Secondary | ICD-10-CM | POA: Insufficient documentation

## 2019-06-30 DIAGNOSIS — Z20828 Contact with and (suspected) exposure to other viral communicable diseases: Secondary | ICD-10-CM | POA: Insufficient documentation

## 2019-07-01 LAB — NOVEL CORONAVIRUS, NAA (HOSP ORDER, SEND-OUT TO REF LAB; TAT 18-24 HRS): SARS-CoV-2, NAA: NOT DETECTED

## 2019-07-04 ENCOUNTER — Ambulatory Visit (HOSPITAL_BASED_OUTPATIENT_CLINIC_OR_DEPARTMENT_OTHER): Payer: Self-pay | Attending: General Practice | Admitting: Cardiovascular Disease

## 2019-07-04 ENCOUNTER — Other Ambulatory Visit: Payer: Self-pay

## 2019-07-04 DIAGNOSIS — R0902 Hypoxemia: Secondary | ICD-10-CM | POA: Insufficient documentation

## 2019-07-04 DIAGNOSIS — Z7951 Long term (current) use of inhaled steroids: Secondary | ICD-10-CM | POA: Insufficient documentation

## 2019-07-04 DIAGNOSIS — G4733 Obstructive sleep apnea (adult) (pediatric): Secondary | ICD-10-CM | POA: Insufficient documentation

## 2019-07-04 DIAGNOSIS — Z7982 Long term (current) use of aspirin: Secondary | ICD-10-CM | POA: Insufficient documentation

## 2019-07-04 DIAGNOSIS — Z79899 Other long term (current) drug therapy: Secondary | ICD-10-CM | POA: Insufficient documentation

## 2019-07-04 MED ORDER — SPIRONOLACTONE 25 MG PO TABS: 25.0000 mg | ORAL_TABLET | Freq: Every day | ORAL | 0 refills | Status: DC

## 2019-07-04 MED ORDER — POTASSIUM CHLORIDE ER 10 MEQ PO TBCR: 10.0000 meq | EXTENDED_RELEASE_TABLET | Freq: Every day | ORAL | 0 refills | Status: DC

## 2019-07-04 MED ORDER — TORSEMIDE 20 MG PO TABS: 40.0000 mg | ORAL_TABLET | Freq: Two times a day (BID) | ORAL | 0 refills | Status: DC

## 2019-07-04 MED FILL — POTASSIUM CHL ER M10 TABLET: 10 | 30 days supply | Qty: 30 | Fill #0 | Status: TO

## 2019-07-04 MED FILL — TORSEMIDE 20 MG TABLET: 20 | 30 days supply | Qty: 120 | Fill #0

## 2019-07-04 MED FILL — SPIRONOLACTONE 25 MG TABLET: 25 | 30 days supply | Qty: 30 | Fill #0 | Status: TO

## 2019-07-04 MED FILL — SPIRIVA 18 MCG CP-HANDIHALE: 18 | 30 days supply | Qty: 30 | Fill #0

## 2019-07-04 MED FILL — POTASSIUM CHLORIDE ER 10 ME: 10 | 30 days supply | Qty: 30 | Fill #0

## 2019-07-04 MED FILL — TORSEMIDE 20 MG TABLET: 20 | 30 days supply | Qty: 120 | Fill #0 | Status: TO

## 2019-07-04 MED FILL — ALBUTEROL SUL 2.5 MG/3 ML S: (2.5 MG/3ML | 12 days supply | Qty: 225 | Fill #0

## 2019-07-04 MED FILL — SPIRONOLACTONE 25 MG TABLET: 25 | 30 days supply | Qty: 30 | Fill #0

## 2019-07-04 MED FILL — DULERA 100 MCG/5 MCG INH: 100-5 | 30 days supply | Qty: 13 | Fill #0

## 2019-07-08 DIAGNOSIS — U071 COVID-19: Secondary | ICD-10-CM

## 2019-07-08 HISTORY — DX: COVID-19: U07.1

## 2019-07-11 ENCOUNTER — Ambulatory Visit: Payer: Self-pay | Admitting: General Practice

## 2019-07-13 ENCOUNTER — Encounter: Payer: Self-pay | Admitting: *Deleted

## 2019-07-13 ENCOUNTER — Other Ambulatory Visit: Payer: Self-pay

## 2019-07-13 ENCOUNTER — Encounter (HOSPITAL_BASED_OUTPATIENT_CLINIC_OR_DEPARTMENT_OTHER): Payer: Self-pay | Admitting: Cardiovascular Disease

## 2019-07-13 ENCOUNTER — Ambulatory Visit (INDEPENDENT_AMBULATORY_CARE_PROVIDER_SITE_OTHER): Payer: Self-pay | Admitting: General Practice

## 2019-07-13 ENCOUNTER — Encounter: Payer: Self-pay | Admitting: General Practice

## 2019-07-13 VITALS — BP 131/76 | HR 72 | Ht 69.0 in | Wt 298.0 lb

## 2019-07-13 DIAGNOSIS — I509 Heart failure, unspecified: Secondary | ICD-10-CM

## 2019-07-13 DIAGNOSIS — G4733 Obstructive sleep apnea (adult) (pediatric): Secondary | ICD-10-CM

## 2019-07-13 DIAGNOSIS — Z79899 Other long term (current) drug therapy: Secondary | ICD-10-CM

## 2019-07-13 DIAGNOSIS — I471 Supraventricular tachycardia: Secondary | ICD-10-CM

## 2019-07-13 LAB — BASIC METABOLIC PANEL
BUN/Creatinine Ratio: 22 (ref 10–24)
BUN: 22 mg/dL (ref 8–27)
CO2: 33 mmol/L — ABNORMAL HIGH (ref 20–29)
Calcium: 9.5 mg/dL (ref 8.6–10.2)
Chloride: 92 mmol/L — ABNORMAL LOW (ref 96–106)
Creatinine, Ser: 0.98 mg/dL (ref 0.76–1.27)
GFR calc Af Amer: 96 mL/min/{1.73_m2} (ref >59)
GFR calc non Af Amer: 83 mL/min/{1.73_m2} (ref >59)
Glucose: 80 mg/dL (ref 65–99)
Potassium: 4.2 mmol/L (ref 3.5–5.2)
Sodium: 143 mmol/L (ref 134–144)

## 2019-07-13 MED ORDER — LOSARTAN POTASSIUM 50 MG PO TABS: 50.0000 mg | ORAL_TABLET | Freq: Every day | ORAL | 3 refills | Status: DC

## 2019-07-13 NOTE — Procedures (Signed)
Patient Name: Justin Rasmussen, Justin Rasmussen Date: 07/04/2019 Gender: Male D.O.B: Feb 17, 1956 Age (years): 63 Referring Provider: Edd Fabian NP Height (inches): 69 Interpreting Physician: Nicki Guadalajara MD, ABSM Weight (lbs): 294 RPSGT: Shelah Lewandowsky BMI: 43 MRN: <br> Neck Size: 18.50  CLINICAL INFORMATION Sleep Study Type: NPSG  Indication for sleep study: Congestive Heart Failure, Morning Headaches, Obesity, OSA, Snoring  Epworth Sleepiness Score: 3  SLEEP STUDY TECHNIQUE As per the AASM Manual for the Scoring of Sleep and Associated Events v2.3 (April 2016) with a hypopnea requiring 4% desaturations.  The channels recorded and monitored were frontal, central and occipital EEG, electrooculogram (EOG), submentalis EMG (chin), nasal and oral airflow, thoracic and abdominal wall motion, anterior tibialis EMG, snore microphone, electrocardiogram, and pulse oximetry.  MEDICATIONS     albuterol (PROVENTIL) (2.5 MG/3ML) 0.083% nebulizer solution         aspirin 81 MG chewable tablet         carvedilol (COREG) 6.25 MG tablet         cetirizine (ZYRTEC) 10 MG tablet         losartan (COZAAR) 50 MG tablet         mometasone-formoterol (DULERA) 100-5 MCG/ACT AERO         potassium chloride (KLOR-CON) 10 MEQ tablet         spironolactone (ALDACTONE) 25 MG tablet         tiotropium (SPIRIVA HANDIHALER) 18 MCG inhalation capsule         torsemide (DEMADEX) 20 MG tablet      Medications self-administered by patient taken the night of the study : N/A  SLEEP ARCHITECTURE The study was initiated at 10:46:15 PM and ended at 5:10:55 AM.  Sleep onset time was 16.1 minutes and the sleep efficiency was 67.3%%. The total sleep time was 259 minutes.  Stage REM latency was 220.0 minutes.  The patient spent 8.9%% of the night in stage N1 sleep, 83.8%% in stage N2 sleep, 0.0%% in stage N3 and 7.3% in REM.  Alpha intrusion was absent.  Supine sleep was 51.33%.  RESPIRATORY  PARAMETERS The overall apnea/hypopnea index (AHI) was 4.9 per hour. The respiratory disturbance index (RDI 17.6/). There were 13 total apneas, including 9 obstructive, 4 central and 0 mixed apneas. There were 8 hypopneas and 55 RERAs.  The AHI during Stage REM sleep was 25.3 per hour.  AHI while supine was 8.1 per hour.  The mean oxygen saturation was 96.5%. The minimum SpO2 during sleep was 88.0%.  Moderate snoring was noted during this study.  CARDIAC DATA The 2 lead EKG demonstrated sinus rhythm. The mean heart rate was 66.0 beats per minute. Other EKG findings include: PVCs.  LEG MOVEMENT DATA The total PLMS were 0 with a resulting PLMS index of 0.0. Associated arousal with leg movement index was 1.4 .  IMPRESSIONS - Borderline mild sleep apnea overall (AHI 4.9/h, but RDI 17.6/h); however, there was mild sleep apnea with supine sleep (AHI 8.1/h) and moderate sleep apnea during REM sleep (AHI 25.3/h). - No significant central sleep apnea occurred during this study (CAI = 0.9/h). - The patient had minimal oxygen desaturation to a nadir of  88.0%) - The patient snored with moderate snoring volume. - EKG findings include PVCs. - Clinically significant periodic limb movements did not occur during sleep. No significant associated arousals.  DIAGNOSIS - Sleep Apnea, unspecifed G47.30 - Nocturnal Hypoxemia (327.26 [G47.36 ICD-10])  RECOMMENDATIONS - In this patient with a significant cardiomyopathy and  heart failure recommend an in lab CPAP titration study. - Effort should be made to optimize nasal and oropharyngeal patency. - Avoid alcohol, sedatives and other CNS depressants that may worsen sleep apnea and disrupt normal sleep architecture. - Sleep hygiene should be reviewed to assess factors that may improve sleep quality. - Weight management (BMI 43) and regular exercise should be initiated or continued if appropriate.  [Electronically signed] 07/13/2019 04:33 PM  Nicki Guadalajara  MD, [facility], ABSM Diplomate, American Board of Sleep Medicine   NPI: [id number] [facility] PH: [phone]   FX: [phone] ACCREDITED BY THE AMERICAN ACADEMY OF SLEEP MEDICINE

## 2019-07-13 NOTE — Progress Notes (Signed)
Thanks, Jesse 

## 2019-07-13 NOTE — Patient Instructions (Addendum)
Medication Instructions:  INCREASE LOSARTAN 50MG  DAILY  If you need a refill on your cardiac medications before your next appointment, please call your pharmacy.  Labwork: BMET TODAY AND IN 2 WEEKS HERE IN OUR OFFICE AT [facility]      You will NOT need to fast   If you have labs (blood work) drawn today and your tests are completely normal, you will receive your results only by: Marland Kitchen MyChart Message (if you have MyChart) OR . A paper copy in the mail If you have any lab test that is abnormal or we need to change your treatment, we will call you to review the results.  Testing/Procedures: Echocardiogram IN 2 MONTHS - Your physician has requested that you have an echocardiogram. Echocardiography is a painless test that uses sound waves to create images of your heart. It provides your doctor with information about the size and shape of your heart and how well your heart's chambers and valves are working. This procedure takes approximately one hour. There are no restrictions for this procedure. This will be performed at our [facility] location - [address].  Special Instructions: INCREASE WALKING GOAL IS 25  MIN/DAY 5 DAYS A WEEK  CONTINUE DAILY WEIGHTS  FOLLOW SALTY 6 HANDOUT  Follow-Up: IN 3 months In Person You may see Sanda Klein, MD JESSE CLEAVER,FNP or one of the following Advanced Practice Providers on your designated Care Team:  Almyra Deforest, PA-C  Fabian Sharp, PA-C or [employer], [place].    At [facility], you and your health needs are our priority.  As part of our continuing mission to provide you with exceptional heart care, we have created designated Provider Care Teams.  These Care Teams include your primary Cardiologist (physician) and Advanced Practice Providers (APPs -  Physician Assistants and Nurse Practitioners) who all work together to provide you with the care you need, when you need it.  Thank you for choosing [facility] at [facility]!!

## 2019-07-13 NOTE — Progress Notes (Signed)
Cardiology Clinic Note   Patient Name: Justin Rasmussen Date of Encounter: 07/13/2019  Primary Care Provider:  Wynn Banker, PA Primary Cardiologist:  Sanda Klein, MD  Patient Profile    Justin Rasmussen 63 year old male presents today for follow-up of his shortness of breath and congestive heart failure.  Past Medical History    Past Medical History:  Diagnosis Date  . COPD (chronic obstructive pulmonary disease) ([place])    Past Surgical History:  Procedure Laterality Date  . RIGHT/LEFT HEART CATH AND CORONARY ANGIOGRAPHY N/A 06/05/2019   Procedure: RIGHT/LEFT HEART CATH AND CORONARY ANGIOGRAPHY;  Surgeon: Jettie Booze, MD;  Location: [facility];  Service: Cardiovascular;  Laterality: N/A;    Allergies  No Known Allergies  History of Present Illness    Justin Rasmussen is a 63 y.o. male with a hx of acute systolic congestive heart failure, cardiomyopathy of unknown etiology, RBBB, cor pulmonale chronic, elevated hemidiaphragm, and morbid obesity.  He was admitted [facility] on 06/01/2019 with increased dyspnea on exertion over the prior 2 to 3 weeks.  He underwent cardiac catheterization on 06/05/2019 which showed nonischemic cardiomyopathy and moderate pulmonary hypertension.  A cardiac MRI on 06/06/2019 showed mildly dilated left ventricle with mild left ventricular hypertrophy and an EF calculated at 16% however, by visual estimate looks closer to 25%, diffuse hypokinesis moderately dilated right ventricle with severely decreased systolic function EF 50% and no myocardial LGE (no definitive evidence of prior MI, myocarditis, or infiltrative disease).  His moderate pulmonary hypertension is thought to be due to his left heart failure and cor pulmonale, obesity hypoventilation syndrome/OSA and his morbid obesity.  During that time he was also having episodes of PAT which were thought to be contributing to his heart failure as well.    He was seen by me on 06/19/2019  he  stated he felt much better since being discharged from the hospital..  His weight had been consistent  around 293- 294 pounds at home and he was 293.4 at that visit.  He had been increasing his activity slowly and doing chores around his house.  However, he stated he was limited due to the oxygen he was carrying around.  He continued to use 3 L of oxygen with activity but states he has been able to titrate the oxygen down to 2.5 L when he is inactive.  His losartan was decreased to 25 mg daily and his carvedilol was increased to 6.25 mg twice daily.  A sleep evaluation was also ordered.  He presents to the clinic today and states he is feeling better.  He continues to walk daily but is increased to 15 to 20 minutes once daily.  He continues to monitor his weight daily 298 today.  He states he has been having more convenient foods lately.  Educated about packaged and fast food options.  We will give him salty 6 information sheet.  He is now using only 2 L of oxygen routinely and states that he is continuing to use his oxygen at night while sleeping.  He does not notice any increased heart rate or palpitations.  He states he has been checking his blood pressure at home with a wrist cuff which she was not no if it is completely accurate or not.  He does not bring a log today but states his blood pressure has been elevated in the 140s at times.  He was instructed to keep a blood pressure log to next appointment.  We will  call him with his sleep study results.  I will increase his losartan today, repeat BMP, and again in 2 weeks.  Repeat echo in 2 months.  He denies chest pain, increased shortness of breath, seen lower extremity edema, fatigue, palpitations, melena, hematuria, hemoptysis, diaphoresis, weakness, presyncope, syncope, orthopnea, and PND.   Home Medications    Prior to Admission medications   Medication Sig Start Date End Date Taking? Authorizing Provider  albuterol (PROVENTIL) (2.5 MG/3ML)  0.083% nebulizer solution Take 3 mLs (2.5 mg total) by nebulization every 4 (four) hours as needed for wheezing or shortness of breath. 06/07/19   Barnetta Chapel, MD  aspirin 81 MG chewable tablet Chew 1 tablet (81 mg total) by mouth daily. 06/08/19   Barnetta Chapel, MD  carvedilol (COREG) 6.25 MG tablet Take 1 tablet (6.25 mg total) by mouth 2 (two) times daily with a meal. 06/19/19   Kee Drudge, Thomasene Ripple, NP  cetirizine (ZYRTEC) 10 MG tablet Take 10 mg by mouth daily as needed for allergies or rhinitis.    [provider]  losartan (COZAAR) 25 MG tablet Take 1 tablet (25 mg total) by mouth daily. 06/19/19   Ronney Asters, NP  mometasone-formoterol (DULERA) 100-5 MCG/ACT AERO Inhale 2 puffs into the lungs 2 (two) times daily. 06/07/19   Barnetta Chapel, MD  potassium chloride (KLOR-CON) 10 MEQ tablet Take 1 tablet (10 mEq total) by mouth daily. 07/04/19   Croitoru, Mihai, MD  spironolactone (ALDACTONE) 25 MG tablet Take 1 tablet (25 mg total) by mouth daily. 07/04/19   Croitoru, Mihai, MD  tiotropium (SPIRIVA HANDIHALER) 18 MCG inhalation capsule Place 1 capsule (18 mcg total) into inhaler and inhale daily. 06/07/19 06/06/20  Berton Mount I, MD  torsemide (DEMADEX) 20 MG tablet Take 2 tablets (40 mg total) by mouth 2 (two) times daily. 07/04/19   Croitoru, Rachelle Hora, MD    Family History    No family history on file. has no family status information on file.   Social History    Social History   Socioeconomic History  . Marital status: Married    Spouse name: Not on file  . Number of children: Not on file  . Years of education: Not on file  . Highest education level: Not on file  Occupational History  . Not on file  Social Needs  . Financial resource strain: Not on file  . Food insecurity    Worry: Not on file    Inability: Not on file  . Transportation needs    Medical: Not on file    Non-medical: Not on file  Tobacco Use  . Smoking status: Former Smoker     Types: Cigarettes    Quit date: 05/31/1989    Years since quitting: 30.1  . Smokeless tobacco: Never Used  Substance and Sexual Activity  . Alcohol use: Not Currently  . Drug use: Not on file  . Sexual activity: Not on file  Lifestyle  . Physical activity    Days per week: Patient refused    Minutes per session: Patient refused  . Stress: Only a little  Relationships  . Social Musician on phone: Not on file    Gets together: Not on file    Attends religious service: Not on file    Active member of club or organization: Not on file    Attends meetings of clubs or organizations: Not on file    Relationship status: Married  . Intimate partner  violence    Fear of current or ex partner: Patient refused    Emotionally abused: Patient refused    Physically abused: Patient refused    Forced sexual activity: Patient refused  Other Topics Concern  . Not on file  Social History Narrative  . Not on file     Review of Systems    General:  No chills, fever, night sweats or weight changes.  Cardiovascular:  No chest pain, dyspnea on exertion, edema, orthopnea, palpitations, paroxysmal nocturnal dyspnea. Dermatological: No rash, lesions/masses Respiratory: No cough, dyspnea Urologic: No hematuria, dysuria Abdominal:   No nausea, vomiting, diarrhea, bright red blood per rectum, melena, or hematemesis Neurologic:  No visual changes, wkns, changes in mental status. All other systems reviewed and are otherwise negative except as noted above.  Physical Exam    VS:  BP 131/76 (BP Location: Left Arm, Patient Position: Sitting, Cuff Size: Large)   Pulse 72   Ht 5\' 9"  (1.753 m)   Wt 298 lb (135.2 kg)   SpO2 97% Comment: 2L O2  BMI 44.01 kg/m  , BMI Body mass index is 44.01 kg/m. GEN: Well nourished, well developed, in no acute distress. HEENT: normal. Neck: Supple, no JVD, carotid bruits, or masses. Cardiac: RRR, no murmurs, rubs, or gallops. No clubbing, cyanosis,  generalized nonpitting lower extremity edema.  Radials/DP/PT 2+ and equal bilaterally.  Respiratory:  Respirations regular and unlabored, clear to auscultation bilaterally. GI: Soft, nontender, nondistended, BS + x 4. MS: no deformity or atrophy. Skin: warm and dry, no rash. Neuro:  Strength and sensation are intact. Psych: Normal affect.  Accessory Clinical Findings    ECG personally reviewed by me today-none today heart rate 72.   Cardiac catheterization 06/04/2017  Mid LAD lesion is 10% stenosed.  Prox RCA lesion is 10% stenosed.  LV end diastolic pressure is moderately elevated.  There is no aortic valve stenosis.  Hemodynamic findings consistent with moderate pulmonary hypertension.  Ao sat 97%, PA sat 65%, mean PA pressure 44 mm Hg; mean PCWP 29 mm Hg; CO 5.1 L/min; CI 2.08  Nonischemic cardiomyopathy.  Moderate pulmonary HTN.  Echocardiogram 06/02/2019 IMPRESSIONS   1. Left ventricular ejection fraction, by visual estimation, is 30 to 35%. The left ventricle has severely decreased function. Left ventricular septal wall thickness was moderately increased. There is no left ventricular hypertrophy. 2. Entire lateral wall, entire anterior wall, and apex are abnormal. 3. Elevated mean left atrial pressure. 4. Left ventricular diastolic Doppler parameters are consistent with pseudonormalization pattern of LV diastolic filling. 5. Global right ventricle has mildly reduced systolic function.The right ventricular size is severely enlarged. No increase in right ventricular wall thickness. 6. Left atrial size was moderately dilated. 7. Right atrial size was moderately dilated. 8. Mild mitral annular calcification. 9. The mitral valve is normal in structure. Trace mitral valve regurgitation. 10. The tricuspid valve is normal in structure. Tricuspid valve regurgitation is trivial. 11. The aortic valve is tricuspid Aortic valve regurgitation was not visualized by  color flow Doppler. Mild aortic valve sclerosis without stenosis. 12. The pulmonic valve was not well visualized. Pulmonic valve regurgitation is not visualized by color flow Doppler. 13. Moderately elevated pulmonary artery systolic pressure. 14. The inferior vena cava is dilated in size with <50% respiratory variability, suggesting right atrial pressure of 15 mmHg.  Cardiac MRI 06/05/2019 IMPRESSION: 1. Mildly dilated LV with mild LV hypertrophy. EF calculated 16% but looks in the 25% range visually (difficult study due to motion artifact). Septal bounce  with diffuse hypokinesis, suspect RV pressure/volume overload.  2. Moderately dilated RV with severely decreased systolic function, EF 25% (visually more moderate dysfunction).  3. No myocardial LGE so no definitive evidence for prior MI, myocarditis, or infiltrative disease.  Assessment & Plan   Congestive heart failure- less short of breath today,  using 2 L of oxygen now compared to 2.5-3 L during prior visit. Weight today is 298 up from 302.8 pounds on 06/05/2019.  He has been able to increase his physical activity walking 15-20 minutes daily. Continue torsemide  40 mg tablet twice daily Continue spironolactone 25 mg tablet daily Increase losartan to 50 mg  tablet daily Contniue carvedilol to 6.25 mg twice daily  Continue Daily weights-call clinic with weight increase greater than 3 pounds in 24 hours or 5 pounds in 1 week. heart healthy low-sodium diet-salty 6 given Repeat echocardiogram in 2 months Order BMP and in 2 weeks for repeat.  Acute on chronic cor pulmonale- may be due to OSA and obesity-hypoventilation syndrome. Order sleep study completed on 07/04/2019, results pending.  Paroxysmal atrial tachycardia- heart rate today 72.   Continue carvedilol to 6.25 mg twice daily  In the future order 48-hour Holter monitor- titrating up beta-blocker  Morbid obesity-weight today 298 pounds Continue torsemide  40 mg  tablet twice daily Heart healthy low-sodium diet-salty 6 given Continue weight loss Increase physical activity as tolerated   Disposition: Follow-up with Dr. Ernest Pine in 3 months.  Thomasene Ripple. Sabin Gibeault NP-C       [facility] [address] Office [phone] Fax [phone]

## 2019-07-25 ENCOUNTER — Other Ambulatory Visit: Payer: Self-pay | Admitting: Cardiovascular Disease

## 2019-07-25 ENCOUNTER — Telehealth: Payer: Self-pay | Admitting: *Deleted

## 2019-07-25 DIAGNOSIS — G4733 Obstructive sleep apnea (adult) (pediatric): Secondary | ICD-10-CM

## 2019-07-25 DIAGNOSIS — G4736 Sleep related hypoventilation in conditions classified elsewhere: Secondary | ICD-10-CM

## 2019-07-25 DIAGNOSIS — I5021 Acute systolic (congestive) heart failure: Secondary | ICD-10-CM

## 2019-07-25 DIAGNOSIS — IMO0002 Reserved for concepts with insufficient information to code with codable children: Secondary | ICD-10-CM

## 2019-07-25 NOTE — Telephone Encounter (Signed)
Left message with wife to have patient to return a call to discuss sleep study results.

## 2019-07-26 NOTE — Telephone Encounter (Signed)
Patient notified of sleep study results and recommendations. He has agreed to having CPAP titration scheduled on 08/06/19. COVID instructions given to patient.

## 2019-07-30 ENCOUNTER — Telehealth: Payer: Self-pay | Admitting: Cardiovascular Disease

## 2019-07-30 ENCOUNTER — Other Ambulatory Visit: Payer: Self-pay | Admitting: Cardiovascular Disease

## 2019-07-30 ENCOUNTER — Institutional Professional Consult (permissible substitution): Payer: Self-pay | Admitting: Critical Care Medicine

## 2019-07-30 MED FILL — SPIRIVA 18 MCG CP-HANDIHALE: 18 | 30 days supply | Qty: 30 | Fill #1

## 2019-07-30 MED FILL — TORSEMIDE 20 MG TABLET: 20 | 30 days supply | Qty: 120 | Fill #0

## 2019-07-30 MED FILL — POTASSIUM CHLORIDE ER 10 ME: 10 | 30 days supply | Qty: 30 | Fill #0

## 2019-07-30 NOTE — Telephone Encounter (Signed)
Called patient spoke to him and wife. He states that his daughter tested positive for COVID- and her husband, patients wife was tested yesterday- they are just concerned with waiting until Friday for his scheduled COVID test, they do not want to put anyone out of having the sleep study completed, if he comes back positive. I advised that his test has to be done within a certain days before the sleep study- but I would reach out to see if we could test any earlier, the earliest would be Wednesday- and that is 3 days before sleep study. She said if they needed to keep it for Friday they would, but advised I would ask our sleep coordinator to find out.

## 2019-07-30 NOTE — Telephone Encounter (Signed)
New Message    [name] is calling and is wondering if the pt can be scheduled for his covid test earlier because their daughter and son in law tested positive over the weekend.   Please call

## 2019-07-30 NOTE — Progress Notes (Deleted)
Synopsis: Referred in November 2020 for *** by [name]*.  Subjective:   PATIENT ID: Justin Rasmussen GENDER: male DOB: 10/08/1955, MRN: [id number]  No chief complaint on file.   HPI  History of COPD in the chart Albuterol Dulera Spiriva Former smoker-quit in 1990-  Cough Sputum Wheeze Can walk Albuterol how often  Home oxygen- 2L   Recent nondiagnostic sleep study   Past Medical History:  Diagnosis Date   COPD (chronic obstructive pulmonary disease) ([facility])      No family history on file.   Past Surgical History:  Procedure Laterality Date   RIGHT/LEFT HEART CATH AND CORONARY ANGIOGRAPHY N/A 06/05/2019   Procedure: RIGHT/LEFT HEART CATH AND CORONARY ANGIOGRAPHY;  Surgeon: Corky Crafts, MD;  Location: [facility];  Service: Cardiovascular;  Laterality: N/A;    Social History   Socioeconomic History   Marital status: Married    Spouse name: Not on file   Number of children: Not on file   Years of education: Not on file   Highest education level: Not on file  Occupational History   Not on file  Social Needs   Financial resource strain: Not on file   Food insecurity    Worry: Not on file    Inability: Not on file   Transportation needs    Medical: Not on file    Non-medical: Not on file  Tobacco Use   Smoking status: Former Smoker    Types: Cigarettes    Quit date: 05/31/1989    Years since quitting: 30.1   Smokeless tobacco: Never Used  Substance and Sexual Activity   Alcohol use: Not Currently   Drug use: Not on file   Sexual activity: Not on file  Lifestyle   Physical activity    Days per week: Patient refused    Minutes per session: Patient refused   Stress: Only a little  Relationships   Event organiser on phone: Not on file    Gets together: Not on file    Attends religious service: Not on file    Active member of club or organization: Not on file    Attends meetings of clubs or  organizations: Not on file    Relationship status: Married   Intimate partner violence    Fear of current or ex partner: Patient refused    Emotionally abused: Patient refused    Physically abused: Patient refused    Forced sexual activity: Patient refused  Other Topics Concern   Not on file  Social History Narrative   Not on file     No Known Allergies   Immunization History  Administered Date(s) Administered   Influenza,inj,Quad PF,6+ Mos 06/02/2019    Outpatient Medications Prior to Visit  Medication Sig Dispense Refill   albuterol (PROVENTIL) (2.5 MG/3ML) 0.083% nebulizer solution Take 3 mLs (2.5 mg total) by nebulization every 4 (four) hours as needed for wheezing or shortness of breath. 75 mL 12   aspirin 81 MG chewable tablet Chew 1 tablet (81 mg total) by mouth daily. 30 tablet 0   carvedilol (COREG) 6.25 MG tablet Take 1 tablet (6.25 mg total) by mouth 2 (two) times daily with a meal. 60 tablet 3   cetirizine (ZYRTEC) 10 MG tablet Take 10 mg by mouth daily as needed for allergies or rhinitis.     losartan (COZAAR) 50 MG tablet Take 1 tablet (50 mg total) by mouth daily. 30 tablet 3   mometasone-formoterol (DULERA)  100-5 MCG/ACT AERO Inhale 2 puffs into the lungs 2 (two) times daily. 13 g 1   potassium chloride (KLOR-CON) 10 MEQ tablet Take 1 tablet (10 mEq total) by mouth daily. 30 tablet 0   spironolactone (ALDACTONE) 25 MG tablet Take 1 tablet (25 mg total) by mouth daily. 30 tablet 0   tiotropium (SPIRIVA HANDIHALER) 18 MCG inhalation capsule Place 1 capsule (18 mcg total) into inhaler and inhale daily. 90 capsule 3   torsemide (DEMADEX) 20 MG tablet Take 2 tablets (40 mg total) by mouth 2 (two) times daily. 120 tablet 0   No facility-administered medications prior to visit.     ROS   Objective:  There were no vitals filed for this visit.   on *** LPM *** RA BMI Readings from Last 3 Encounters:  07/13/19 44.01 kg/m  07/04/19 43.42 kg/m    06/19/19 43.33 kg/m   Wt Readings from Last 3 Encounters:  07/13/19 298 lb (135.2 kg)  07/04/19 294 lb (133.4 kg)  06/19/19 293 lb 6.4 oz (133.1 kg)    Physical Exam   CBC    Component Value Date/Time   WBC 9.1 06/05/2019 0547   RBC 5.17 06/05/2019 0547   HGB 16.3 06/05/2019 0800   HCT 48.0 06/05/2019 0800   PLT 168 06/05/2019 0547   MCV 92.3 06/05/2019 0547   MCH 28.4 06/05/2019 0547   MCHC 30.8 06/05/2019 0547   RDW 14.3 06/05/2019 0547   LYMPHSABS 1.1 06/05/2019 0547   MONOABS 1.0 06/05/2019 0547   EOSABS 0.2 06/05/2019 0547   BASOSABS 0.0 06/05/2019 0547    CHEMISTRY CMP     Component Value Date/Time   NA 143 07/13/2019 0948   K 4.2 07/13/2019 0948   CL 92 (L) 07/13/2019 0948   CO2 33 (H) 07/13/2019 0948   GLUCOSE 80 07/13/2019 0948   GLUCOSE 109 (H) 06/07/2019 0443   BUN 22 07/13/2019 0948   CREATININE 0.98 07/13/2019 0948   CALCIUM 9.5 07/13/2019 0948   PROT 5.7 (L) 06/03/2019 0514   ALBUMIN 3.1 (L) 06/07/2019 0443   AST 24 06/03/2019 0514   ALT 25 06/03/2019 0514   ALKPHOS 54 06/03/2019 0514   BILITOT 1.2 06/03/2019 0514   GFRNONAA 83 07/13/2019 0948   GFRAA 96 07/13/2019 0948     Chest Imaging- films reviewed: CXR, 1 view 06/01/2019- dependent right pleural effusion with silhouetting of the hemidiaphragm.  Pulmonary edema bilaterally.  Pulmonary Functions Testing Results: No flowsheet data found.   Echocardiogram 06/02/2019: LVEF 30 to 35%, abnormal septal thickness, regional wall motion abnormalities involving the entire lateral wall, anterior wall, and apex.  Diastolic dysfunction.  Enlarged RV with reduced function.  Dilated atria bilaterally.  Trace MR and TR.  Mild aortic sclerosis without stenosis.  Elevated right atrial pressure.  Heart Catheterization 06/05/2019:   Mid LAD lesion is 10% stenosed.  Prox RCA lesion is 10% stenosed.  LV end diastolic pressure is moderately elevated.  There is no aortic valve stenosis.  Hemodynamic  findings consistent with moderate pulmonary hypertension.  Ao sat 97%, PA sat 65%, mean PA pressure 44 mm Hg; mean PCWP 29 mm Hg; CO 5.1 L/min; CI 2.08 Impression: Nonischemic cardiomyopathy. Moderate pulmonary HTN.     Assessment & Plan:     ICD-10-CM   1. Morbid obesity (HCC)  E66.01   2. DOE (dyspnea on exertion)  R06.00   3. HFrEF (heart failure with reduced ejection fraction) (HCC)  I50.20   4. Chronic respiratory failure with hypoxia  and hypercapnia ([facility])  J96.11    J96.12       Current Outpatient Medications:    albuterol (PROVENTIL) (2.5 MG/3ML) 0.083% nebulizer solution, Take 3 mLs (2.5 mg total) by nebulization every 4 (four) hours as needed for wheezing or shortness of breath., Disp: 75 mL, Rfl: 12   aspirin 81 MG chewable tablet, Chew 1 tablet (81 mg total) by mouth daily., Disp: 30 tablet, Rfl: 0   carvedilol (COREG) 6.25 MG tablet, Take 1 tablet (6.25 mg total) by mouth 2 (two) times daily with a meal., Disp: 60 tablet, Rfl: 3   cetirizine (ZYRTEC) 10 MG tablet, Take 10 mg by mouth daily as needed for allergies or rhinitis., Disp: , Rfl:    losartan (COZAAR) 50 MG tablet, Take 1 tablet (50 mg total) by mouth daily., Disp: 30 tablet, Rfl: 3   mometasone-formoterol (DULERA) 100-5 MCG/ACT AERO, Inhale 2 puffs into the lungs 2 (two) times daily., Disp: 13 g, Rfl: 1   potassium chloride (KLOR-CON) 10 MEQ tablet, Take 1 tablet (10 mEq total) by mouth daily., Disp: 30 tablet, Rfl: 0   spironolactone (ALDACTONE) 25 MG tablet, Take 1 tablet (25 mg total) by mouth daily., Disp: 30 tablet, Rfl: 0   tiotropium (SPIRIVA HANDIHALER) 18 MCG inhalation capsule, Place 1 capsule (18 mcg total) into inhaler and inhale daily., Disp: 90 capsule, Rfl: 3   torsemide (DEMADEX) 20 MG tablet, Take 2 tablets (40 mg total) by mouth 2 (two) times daily., Disp: 120 tablet, Rfl: 0   Steffanie Dunn, DO [facility] 07/30/2019 7:17 AM

## 2019-07-31 MED FILL — ALBUTEROL SUL 2.5 MG/3 ML S: (2.5 MG/3ML | 12 days supply | Qty: 225 | Fill #1

## 2019-08-01 ENCOUNTER — Other Ambulatory Visit: Payer: Self-pay | Admitting: Cardiovascular Disease

## 2019-08-03 ENCOUNTER — Other Ambulatory Visit (HOSPITAL_COMMUNITY)
Admission: RE | Admit: 2019-08-03 | Discharge: 2019-08-03 | Disposition: A | Discharge status: Home / Self Care | Payer: Medicaid Other | Source: Ambulatory Visit | Attending: Cardiovascular Disease | Admitting: Cardiovascular Disease

## 2019-08-03 ENCOUNTER — Other Ambulatory Visit: Payer: Self-pay

## 2019-08-03 DIAGNOSIS — Z01812 Encounter for preprocedural laboratory examination: Secondary | ICD-10-CM | POA: Diagnosis present

## 2019-08-03 DIAGNOSIS — U071 COVID-19: Secondary | ICD-10-CM | POA: Diagnosis not present

## 2019-08-04 ENCOUNTER — Telehealth: Payer: Self-pay | Admitting: Physician Assistant

## 2019-08-04 LAB — SARS CORONAVIRUS 2 (TAT 6-24 HRS): SARS Coronavirus 2: POSITIVE — AB

## 2019-08-04 NOTE — Progress Notes (Signed)
Donnie Aho., PA for Dr. Shelva Majestic made aware of pt's positive covid test result.

## 2019-08-04 NOTE — Telephone Encounter (Signed)
Ms. Justin Rasmussen called because [name] Covid test came back positive.  He has a sleep study scheduled for Monday.  I called [name], and got his wife.  She stated that their daughter as well as her husband and son have all tested positive for Covid.  She has tested negative, but that was several days ago and she feels she should get retested.  She is not surprised that [name] tested positive.  I advised that she needed to reschedule the sleep study and she understands.  I advised that they need to maintain strict quarantine, and she understands that as well.  Rosaria Ferries, PA-C 08/04/2019 1:23 PM Beeper [phone]

## 2019-08-05 ENCOUNTER — Other Ambulatory Visit: Payer: Self-pay

## 2019-08-05 ENCOUNTER — Telehealth: Payer: Self-pay | Admitting: Nurse Practitioner

## 2019-08-05 NOTE — Telephone Encounter (Signed)
Discussed with patient about Covid symptoms and possible use of bamlanivimab, a monoclonal antibody infusion for those with mild to moderate Covid symptoms and at a high risk of hospitalization.  Pt is not qualified at this time, symptoms started > 10 days ago and currently has been asymptomatic x 2 days.  Discussed continued isolation at home for the next 10 days since positive test and watch for any further symptoms to occur.  If increasing SOB or difficulty breathing made his wife and him aware to seek immediate care.

## 2019-08-06 ENCOUNTER — Encounter (HOSPITAL_BASED_OUTPATIENT_CLINIC_OR_DEPARTMENT_OTHER): Payer: Self-pay | Admitting: Cardiovascular Disease

## 2019-08-06 MED ORDER — SPIRONOLACTONE 25 MG PO TABS: 25.0000 mg | ORAL_TABLET | Freq: Every day | ORAL | 0 refills | Status: DC

## 2019-08-06 MED ORDER — TORSEMIDE 20 MG PO TABS: ORAL_TABLET | ORAL | 0 refills | Status: DC

## 2019-08-06 MED FILL — SPIRONOLACTONE 25 MG TABLET: 25 | 30 days supply | Qty: 30 | Fill #0

## 2019-08-13 ENCOUNTER — Institutional Professional Consult (permissible substitution): Payer: Self-pay | Admitting: Critical Care Medicine

## 2019-08-26 ENCOUNTER — Other Ambulatory Visit: Payer: Self-pay

## 2019-08-27 ENCOUNTER — Telehealth: Payer: Self-pay | Admitting: Cardiovascular Disease

## 2019-08-27 MED ORDER — POTASSIUM CHLORIDE ER 10 MEQ PO TBCR: 10.0000 meq | EXTENDED_RELEASE_TABLET | Freq: Every day | ORAL | 10 refills | Status: DC

## 2019-08-27 MED ORDER — SPIRONOLACTONE 25 MG PO TABS: 25.0000 mg | ORAL_TABLET | Freq: Every day | ORAL | 3 refills | Status: DC

## 2019-08-27 MED ORDER — TORSEMIDE 20 MG PO TABS: ORAL_TABLET | ORAL | 10 refills | Status: DC

## 2019-08-27 NOTE — Telephone Encounter (Signed)
Pt is stable on both medications, potassium level well WNL, please fill both medications

## 2019-08-27 NOTE — Telephone Encounter (Signed)
DISCUSSED WITH JESSICA, PHARMACIST

## 2019-08-27 NOTE — Telephone Encounter (Signed)
Pt c/o medication issue:  1. Name of Medication: spironolactone (ALDACTONE) 25 MG tablet and potassium chloride (KLOR-CON) 10 MEQ tablet  2. How are you currently taking this medication (dosage and times per day)? N/A  3. Are you having a reaction (difficulty breathing--STAT)? No  4. What is your medication issue? Jessica from [facility] is calling because there is a conflict between these two medications that were just sent in.

## 2019-08-27 NOTE — Telephone Encounter (Signed)
Rx(s) sent to pharmacy electronically.  

## 2019-09-04 ENCOUNTER — Other Ambulatory Visit: Payer: Self-pay

## 2019-09-04 ENCOUNTER — Encounter: Payer: Self-pay | Admitting: Critical Care Medicine

## 2019-09-04 ENCOUNTER — Ambulatory Visit: Payer: Medicaid Other | Admitting: Critical Care Medicine

## 2019-09-04 VITALS — BP 140/68 | HR 64 | Temp 97.0°F | Ht 69.0 in | Wt 308.4 lb

## 2019-09-04 DIAGNOSIS — R062 Wheezing: Secondary | ICD-10-CM | POA: Diagnosis not present

## 2019-09-04 DIAGNOSIS — E662 Morbid (severe) obesity with alveolar hypoventilation: Secondary | ICD-10-CM | POA: Diagnosis not present

## 2019-09-04 DIAGNOSIS — I5020 Unspecified systolic (congestive) heart failure: Secondary | ICD-10-CM

## 2019-09-04 DIAGNOSIS — J9611 Chronic respiratory failure with hypoxia: Secondary | ICD-10-CM | POA: Diagnosis not present

## 2019-09-04 NOTE — Patient Instructions (Addendum)
Thank you for visiting Dr. Carlis Abbott at [facility]. We recommend the following: Orders Placed This Encounter  Procedures  . Pulmonary function test   Orders Placed This Encounter  Procedures  . Pulmonary function test    Standing Status:   Future    Standing Expiration Date:   09/03/2020    Order Specific Question:   Where should this test be performed?    Answer:   [facility]    Order Specific Question:   Full PFT: includes the following: basic spirometry, spirometry pre & post bronchodilator, diffusion capacity (DLCO), lung volumes    Answer:   Full PFT     Okay to stop using nebulizer two times daily and just use as needed.  Keep using Dulera two times daily and Spiriva once daily. Make sure to rinse your mouth out after your [facility]. We will see after your pulmonary function studies if you still need these medications.    Return in about 3 months (around 12/03/2019). after PFTs.    Please do your part to reduce the spread of COVID-19.

## 2019-09-04 NOTE — Progress Notes (Signed)
Synopsis: Referred in December 2020 for chronic hypoxic respiratory failure by [name]*.  Subjective:   PATIENT ID: Justin Rasmussen, Justin Rasmussen  Chief Complaint  Patient presents with  . Consult    Patient is here for shortness of breath with exertion. Patient was in the hospital for CHF 06/01/19 and was discharged with 2L O2 24/7. Patient was diagnosed with covid 08/03/19.     Justin Rasmussen is a 63 year old gentleman who was referred for management of chronic respiratory failure.  Justin Rasmussen was started on 2 L supplemental oxygen when Justin Rasmussen was hospitalized in September 2020 for newly diagnosed HFrEF.  During that admission Justin Rasmussen lost about 50 pounds with diuresis.  Heart catheterization demonstrated nonischemic cardiomyopathy.  Justin Rasmussen has been continued on guideline directed heart failure treatment, and has continued 2 L of supplemental oxygen.  Over the last few months his saturations on 2 L have improved-often now in the upper 90s at rest.  Justin Rasmussen developed COVID-19 in late November, and at the time was in the lower 90s on his supplemental oxygen, but recovered well without residual symptoms.  Prior to his hospitalization Justin Rasmussen had wheezing, and during his hospitalization Justin Rasmussen was diagnosed with COPD.  Justin Rasmussen was started on Dulera twice daily and Spiriva once daily prior to discharge.  Justin Rasmussen was also instructed to use his albuterol nebulizer twice daily, all of which Justin Rasmussen is continued.  Justin Rasmussen has a history of smoking 1 pack/day x 15 years prior to quitting in 1990.  Justin Rasmussen has no previous history of asthma or other lung disease, and there is no family history of lung disease.  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  Currently Justin Rasmussen has dyspnea on exertion, but can walk around a store like [employer] or [employer], and thinks Justin Rasmussen could walk about a block outside.  Justin Rasmussen does not get short of breath with ADLs.  Justin Rasmussen is no longer wheezing.  Justin Rasmussen only has coughing when his  allergies are bad.  Justin Rasmussen uses Flonase and Zyrtec to control his allergies as needed.  Justin Rasmussen has tried stopping his oxygen at home for up to about an hour, and reports that his saturations have stayed 90 or above.  His lower extremity edema has been resolved since his admission.  Justin Rasmussen was diagnosed with OSA, and has a titration sleep study pending.  This was delayed when Justin Rasmussen developed Covid.        Past Medical History:  Diagnosis Date  . 2019 novel coronavirus disease (COVID-19) 07/2019  . COPD (chronic obstructive pulmonary disease) (HCC)   . HFrEF (heart failure with reduced ejection fraction) (HCC)   . Nonischemic cardiomyopathy (HCC)   . OSA (obstructive sleep apnea)   . Pulmonary hypertension (HCC)    WHO group II     Family History  Problem Relation Age of Onset  . Kidney disease Mother   . Diabetes Father   . Heart attack Father   . Diabetes Sister   . Heart disease Sister      Past Surgical History:  Procedure Laterality Date  . ADENOIDECTOMY    . RIGHT/LEFT HEART CATH AND CORONARY ANGIOGRAPHY N/A 06/05/2019   Procedure: RIGHT/LEFT HEART CATH AND CORONARY ANGIOGRAPHY;  Surgeon: Corky Crafts, MD;  Location: [facility] INVASIVE CV LAB;  Service: Cardiovascular;  Laterality: N/A;    Social History   Socioeconomic History  . Marital status: Married    Spouse name: Not on file  .  Number of children: Not on file  . Years of education: Not on file  . Highest education level: Not on file  Occupational History  . Not on file  Tobacco Use  . Smoking status: Former Smoker    Packs/day: 1.00    Years: 15.00    Pack years: 15.00    Types: Cigarettes    Quit date: 05/31/1989    Years since quitting: 30.2  . Smokeless tobacco: Never Used  Substance and Sexual Activity  . Alcohol use: Not Currently  . Drug use: Never  . Sexual activity: Not on file  Other Topics Concern  . Not on file  Social History Narrative  . Not on file   Social Determinants of Health   Financial  Resource Strain:   . Difficulty of Paying Living Expenses: Not on file  Food Insecurity:   . Worried About Programme researcher, broadcasting/film/video in the Last Year: Not on file  . Ran Out of Food in the Last Year: Not on file  Transportation Needs:   . Lack of Transportation (Medical): Not on file  . Lack of Transportation (Non-Medical): Not on file  Physical Activity: Unknown  . Days of Exercise per Week: Patient refused  . Minutes of Exercise per Session: Patient refused  Stress: No Stress Concern Present  . Feeling of Stress : Only a little  Social Connections: Unknown  . Frequency of Communication with Friends and Family: Not on file  . Frequency of Social Gatherings with Friends and Family: Not on file  . Attends Religious Services: Not on file  . Active Member of Clubs or Organizations: Not on file  . Attends Banker Meetings: Not on file  . Marital Status: Married  Catering manager Violence: Unknown  . Fear of Current or Ex-Partner: Patient refused  . Emotionally Abused: Patient refused  . Physically Abused: Patient refused  . Sexually Abused: Patient refused     No Known Allergies   Immunization History  Administered Date(s) Administered  . Influenza,inj,Quad PF,6+ Mos 06/02/2019    Outpatient Medications Prior to Visit  Medication Sig Dispense Refill  . albuterol (PROVENTIL) (2.5 MG/3ML) 0.083% nebulizer solution Take 3 mLs (2.5 mg total) by nebulization every 4 (four) hours as needed for wheezing or shortness of breath. 75 mL 12  . aspirin 81 MG chewable tablet Chew 1 tablet (81 mg total) by mouth daily. 30 tablet 0  . carvedilol (COREG) 6.25 MG tablet Take 1 tablet (6.25 mg total) by mouth 2 (two) times daily with a meal. 60 tablet 3  . cetirizine (ZYRTEC) 10 MG tablet Take 10 mg by mouth daily as needed for allergies or rhinitis.    Marland Kitchen losartan (COZAAR) 50 MG tablet Take 1 tablet (50 mg total) by mouth daily. 30 tablet 3  . mometasone-formoterol (DULERA) 100-5 MCG/ACT  AERO Inhale 2 puffs into the lungs 2 (two) times daily. 13 g 1  . potassium chloride (KLOR-CON) 10 MEQ tablet Take 1 tablet (10 mEq total) by mouth daily. 30 tablet 10  . spironolactone (ALDACTONE) 25 MG tablet Take 1 tablet (25 mg total) by mouth daily. 90 tablet 3  . tiotropium (SPIRIVA HANDIHALER) 18 MCG inhalation capsule Place 1 capsule (18 mcg total) into inhaler and inhale daily. 90 capsule 3  . torsemide (DEMADEX) 20 MG tablet Take two tablets by mouth two time daily. 120 tablet 10   No facility-administered medications prior to visit.    Review of Systems  Constitutional: Negative for chills and  fever.  HENT: Negative.        Occasional postnasal drip and congestion  Respiratory: Positive for shortness of breath. Negative for cough and wheezing.   Cardiovascular: Negative for chest pain and leg swelling.  Gastrointestinal: Negative.   Genitourinary: Negative.   Musculoskeletal: Negative.   Skin: Negative.   Neurological: Negative.   Endo/Heme/Allergies: Positive for environmental allergies.     Objective:   Vitals:   09/04/19 1529  BP: 140/68  Pulse: 64  Temp: (!) 97 F (36.1 C)  TempSrc: Temporal  SpO2: 97%  Weight: (!) 308 lb 6.4 oz (139.9 kg)  Height: 5\' 9"  (1.753 m)   97% on 2 LPM  O2 BMI Readings from Last 3 Encounters:  09/04/19 45.54 kg/m  07/13/19 44.01 kg/m  07/04/19 43.42 kg/m   Wt Readings from Last 3 Encounters:  09/04/19 (!) 308 lb 6.4 oz (139.9 kg)  07/13/19 298 lb (135.2 kg)  07/04/19 294 lb (133.4 kg)    Physical Exam Vitals reviewed.  Constitutional:      General: Justin Rasmussen is not in acute distress.    Appearance: Justin Rasmussen is obese. Justin Rasmussen is not ill-appearing.  HENT:     Head: Normocephalic and atraumatic.     Nose:     Comments: Deferred due to masking requirement.    Mouth/Throat:     Comments: Deferred due to masking requirement. Eyes:     General: No scleral icterus. Cardiovascular:     Rate and Rhythm: Normal rate and regular rhythm.      Comments: No JVD Pulmonary:     Comments: Breathing comfortably on RA (stayed 91+% throughout encounter without O2). CTAB.  No conversational dyspnea. Abdominal:     General: There is no distension.     Palpations: Abdomen is soft.     Tenderness: There is no abdominal tenderness.  Musculoskeletal:        General: No swelling or deformity.     Cervical back: Neck supple.  Lymphadenopathy:     Cervical: No cervical adenopathy.  Skin:    General: Skin is warm and dry.     Findings: No rash.  Neurological:     General: No focal deficit present.     Mental Status: Justin Rasmussen is alert.     Motor: No weakness.     Coordination: Coordination normal.  Psychiatric:        Mood and Affect: Mood normal.        Behavior: Behavior normal.      CBC    Component Value Date/Time   WBC 9.1 06/05/2019 0547   RBC 5.17 06/05/2019 0547   HGB 16.3 06/05/2019 0800   HCT 48.0 06/05/2019 0800   PLT 168 06/05/2019 0547   MCV 92.3 06/05/2019 0547   MCH 28.4 06/05/2019 0547   MCHC 30.8 06/05/2019 0547   RDW 14.3 06/05/2019 0547   LYMPHSABS 1.1 06/05/2019 0547   MONOABS 1.0 06/05/2019 0547   EOSABS 0.2 06/05/2019 0547   BASOSABS 0.0 06/05/2019 0547    CHEMISTRY No results for input(s): NA, K, CL, CO2, GLUCOSE, BUN, CREATININE, CALCIUM, MG, PHOS in the last 168 hours. CrCl cannot be calculated (Patient's most recent lab result is older than the maximum 21 days allowed.).  VBG 06/05/2019 (during heart catheterization) - 7.30/ 78/ 39/ 39  08/03/2019 SARS-CoV-2 positive   Chest Imaging- films reviewed: 06/01/2019 CXR, 1 view- Right-sided pleural effusion, widened mediastinum, especially in the right hemithorax, but this could be partially artifact related to rotation.  Increased interstitial  markings bilaterally.   Pulmonary Functions Testing Results: No flowsheet data found.   Echocardiogram 06/02/2019: LV EF 30 to 35%, moderately increased LV septal wall thickness but no LVH.  Regional wall  motion abnormalities of the lateral, anterior walls and apex.  Increased left atrial pressure.  Diastolic dysfunction.  Reduced global RV systolic function with severe dilation.  Moderately dilated atria bilaterally.  Mild aortic sclerosis without stenosis, trace MR and TR.  Elevated right atrial pressure.   Heart Catheterization 06/05/2019: Mid LAD lesion is 10% stenosed.  Prox RCA lesion is 10% stenosed.  LV end diastolic pressure is moderately elevated.  There is no aortic valve stenosis.  Hemodynamic findings consistent with moderate pulmonary hypertension.  Ao sat 97%, PA sat 65%, mean PA pressure 44 mm Hg; mean PCWP 29 mm Hg; CO 5.1 L/min; CI 2.08   Nonischemic cardiomyopathy.  Moderate pulmonary HTN.    Assessment & Plan:     ICD-10-CM   1. Chronic respiratory failure with hypoxia (HCC)  J96.11 Pulmonary function test  2. HFrEF (heart failure with reduced ejection fraction) (HCC)  I50.20 Pulmonary function test  3. Obesity hypoventilation syndrome (HCC)  E66.2   4. Wheezing  R06.2       Chronic hypoxic respiratory failure due to compensated chronic HFrEF.  Likely obesity hypoventilation syndrome with VBG demonstrating CO2 in the 70s and his heart catheterization. -Walked in the office today-requires 2 L supplemental oxygen with ambulation and sleep -Continue follow-up and medications per cardiology management -Long-term attempts at modest weight loss would be beneficial as Justin Rasmussen is at risk for obesity hypoventilation syndrome. -Cautioned to remain vigilant with COVID-19 precautions-social distancing, mask wearing, handwashing as repeat infections have been described.  Recommend the vaccine once it is available.  History of wheezing. Unclear if Justin Rasmussen has COPD or if his wheezing was a symptom of decompensated heart failure. -We will obtain PFTs then decide if de-escalation of inhalers is appropriate -Okay to stop albuterol nebulizer twice daily scheduled.  It is okay to use these  as needed.   RTC in 2-3 months after PFTs.    Current Outpatient Medications:  .  albuterol (PROVENTIL) (2.5 MG/3ML) 0.083% nebulizer solution, Take 3 mLs (2.5 mg total) by nebulization every 4 (four) hours as needed for wheezing or shortness of breath., Disp: 75 mL, Rfl: 12 .  aspirin 81 MG chewable tablet, Chew 1 tablet (81 mg total) by mouth daily., Disp: 30 tablet, Rfl: 0 .  carvedilol (COREG) 6.25 MG tablet, Take 1 tablet (6.25 mg total) by mouth 2 (two) times daily with a meal., Disp: 60 tablet, Rfl: 3 .  cetirizine (ZYRTEC) 10 MG tablet, Take 10 mg by mouth daily as needed for allergies or rhinitis., Disp: , Rfl:  .  losartan (COZAAR) 50 MG tablet, Take 1 tablet (50 mg total) by mouth daily., Disp: 30 tablet, Rfl: 3 .  mometasone-formoterol (DULERA) 100-5 MCG/ACT AERO, Inhale 2 puffs into the lungs 2 (two) times daily., Disp: 13 g, Rfl: 1 .  potassium chloride (KLOR-CON) 10 MEQ tablet, Take 1 tablet (10 mEq total) by mouth daily., Disp: 30 tablet, Rfl: 10 .  spironolactone (ALDACTONE) 25 MG tablet, Take 1 tablet (25 mg total) by mouth daily., Disp: 90 tablet, Rfl: 3 .  tiotropium (SPIRIVA HANDIHALER) 18 MCG inhalation capsule, Place 1 capsule (18 mcg total) into inhaler and inhale daily., Disp: 90 capsule, Rfl: 3 .  torsemide (DEMADEX) 20 MG tablet, Take two tablets by mouth two time daily., Disp: 120 tablet, Rfl:  10   Steffanie Dunn, DO [facility] 09/04/2019 6:31 PM

## 2019-09-12 ENCOUNTER — Ambulatory Visit (HOSPITAL_COMMUNITY): Payer: Medicaid Other | Attending: General Practice

## 2019-09-12 ENCOUNTER — Other Ambulatory Visit: Payer: Self-pay

## 2019-09-12 DIAGNOSIS — I509 Heart failure, unspecified: Secondary | ICD-10-CM | POA: Diagnosis present

## 2019-09-12 DIAGNOSIS — Z79899 Other long term (current) drug therapy: Secondary | ICD-10-CM | POA: Diagnosis present

## 2019-09-12 MED ORDER — PERFLUTREN LIPID MICROSPHERE
1.0000 mL | INTRAVENOUS | Status: AC | PRN
  Administered 2019-09-12: 09:00:00 3 mL via INTRAVENOUS

## 2019-09-23 ENCOUNTER — Other Ambulatory Visit: Payer: Self-pay

## 2019-09-23 ENCOUNTER — Ambulatory Visit (HOSPITAL_BASED_OUTPATIENT_CLINIC_OR_DEPARTMENT_OTHER): Payer: Medicaid Other | Attending: Cardiovascular Disease | Admitting: Cardiovascular Disease

## 2019-09-23 DIAGNOSIS — G4736 Sleep related hypoventilation in conditions classified elsewhere: Secondary | ICD-10-CM | POA: Insufficient documentation

## 2019-09-23 DIAGNOSIS — I5021 Acute systolic (congestive) heart failure: Secondary | ICD-10-CM | POA: Insufficient documentation

## 2019-09-23 DIAGNOSIS — G4733 Obstructive sleep apnea (adult) (pediatric): Secondary | ICD-10-CM | POA: Insufficient documentation

## 2019-09-23 DIAGNOSIS — IMO0002 Reserved for concepts with insufficient information to code with codable children: Secondary | ICD-10-CM

## 2019-10-02 ENCOUNTER — Encounter (HOSPITAL_BASED_OUTPATIENT_CLINIC_OR_DEPARTMENT_OTHER): Payer: Self-pay | Admitting: Cardiovascular Disease

## 2019-10-02 NOTE — Procedures (Signed)
Patient Name: Justin Rasmussen, Justin Rasmussen Date: 09/23/2019 Gender: Male D.O.B: [birth year] Age (years): 50 Referring Provider: Nicki Guadalajara MD, ABSM Height (inches): 69 Interpreting Physician: Nicki Guadalajara MD, ABSM Weight (lbs): 308 RPSGT: Armen Pickup BMI: 45 MRN: [id number] Neck Size: 18.00  CLINICAL INFORMATION The patient is referred for a CPAP titration to treat sleep apnea.  Date of NPSG:  07/04/19: AHI 4.9/h; RDI 17.6/h; REM AHI 25.3/h  SLEEP STUDY TECHNIQUE As per the AASM Manual for the Scoring of Sleep and Associated Events v2.3 (April 2016) with a hypopnea requiring 4% desaturations.  The channels recorded and monitored were frontal, central and occipital EEG, electrooculogram (EOG), submentalis EMG (chin), nasal and oral airflow, thoracic and abdominal wall motion, anterior tibialis EMG, snore microphone, electrocardiogram, and pulse oximetry. Continuous positive airway pressure (CPAP) was initiated at the beginning of the study and titrated to treat sleep-disordered breathing.  MEDICATIONS albuterol (PROVENTIL) (2.5 MG/3ML) 0.083% nebulizer solution aspirin 81 MG chewable tablet carvedilol (COREG) 6.25 MG tablet cetirizine (ZYRTEC) 10 MG tablet losartan (COZAAR) 50 MG tablet mometasone-formoterol (DULERA) 100-5 MCG/ACT AERO potassium chloride (KLOR-CON) 10 MEQ tablet spironolactone (ALDACTONE) 25 MG tablet tiotropium (SPIRIVA HANDIHALER) 18 MCG inhalation capsule torsemide (DEMADEX) 20 MG tablet  Medications self-administered by patient taken the night of the study : N/A  TECHNICIAN COMMENTS Comments added by technician: ONE RSTROOM VISTED. Patient had difficulty initiating sleep. Patient was restless all through the night. Comments added by scorer: N/A  RESPIRATORY PARAMETERS Optimal PAP Pressure (cm): 10 AHI at Optimal Pressure (/hr): 0.0 Overall Minimal O2 (%): 86.0 Supine % at Optimal Pressure (%): 83 Minimal O2 at Optimal Pressure (%): 87.0   SLEEP  ARCHITECTURE The study was initiated at 9:57:33 PM and ended at 4:33:54 AM.  Sleep onset time was 41.3 minutes and the sleep efficiency was 65.9%%. The total sleep time was 261 minutes.  The patient spent 6.1%% of the night in stage N1 sleep, 53.8%% in stage N2 sleep, 26.6%% in stage N3 and 13.4% in REM.Stage REM latency was 125.0 minutes  Wake after sleep onset was 94.0. Alpha intrusion was absent. Supine sleep was 53.35%.  CARDIAC DATA The 2 lead EKG demonstrated sinus rhythm. The mean heart rate was 67.0 beats per minute. Other EKG findings include: PVCs.  LEG MOVEMENT DATA The total Periodic Limb Movements of Sleep (PLMS) were 0. The PLMS index was 0.0. A PLMS index of <15 is considered normal in adults.  IMPRESSIONS - CPAP was initiated at 6 cm and was titrated to optimal PAP pressure at 10 cm of water. - Central sleep apnea was not noted during this titration (CAI = 0.2/h). - Moderate oxygen desaturations to a nadir of 86.0%. - No snoring was audible during this study. - 2-lead EKG demonstrated: PVCs - Clinically significant periodic limb movements were not noted during this study. Arousals associated with PLMs were significant.  DIAGNOSIS - Obstructive Sleep Apnea (327.23 [G47.33 ICD-10]) - Periodic Limb Movement During Sleep (327.51 [G47.61 ICD-10])  RECOMMENDATIONS - Trial of CPAP therapy on 10 cm H2O with heated humidification. A Medium size Resmed Full Face Mask AirFit F20 mask was used for the titration. - Effort should be made to optimize nasal and oropharyngeal patency. - Avoid alcohol, sedatives and other CNS depressants that may worsen sleep apnea and disrupt normal sleep architecture. - Sleep hygiene should be reviewed to assess factors that may improve sleep quality. - Weight management (BMI 45) and regular exercise should be initiated or continued. - Recommend a download in  30 days and sleep clinic evaluation after 4 weeks of therapy.   [Electronically signed]  10/02/2019 06:38 PM  Nicki Guadalajara MD, [facility], ABSM Diplomate, American Board of Sleep Medicine   NPI: [id number] [facility] PH: [phone]   FX: [phone] ACCREDITED BY THE AMERICAN ACADEMY OF SLEEP MEDICINE

## 2019-10-05 ENCOUNTER — Telehealth: Payer: Self-pay | Admitting: *Deleted

## 2019-10-05 NOTE — Telephone Encounter (Signed)
Left message to return a call to inform CPAP order has been sent to [facility].

## 2019-10-17 ENCOUNTER — Encounter: Payer: Self-pay | Admitting: Cardiovascular Disease

## 2019-10-17 ENCOUNTER — Other Ambulatory Visit: Payer: Self-pay

## 2019-10-17 ENCOUNTER — Ambulatory Visit: Payer: Medicaid Other | Admitting: Cardiovascular Disease

## 2019-10-17 VITALS — BP 128/80 | HR 76 | Ht 69.0 in | Wt 311.4 lb

## 2019-10-17 DIAGNOSIS — I471 Supraventricular tachycardia: Secondary | ICD-10-CM | POA: Diagnosis not present

## 2019-10-17 DIAGNOSIS — J9612 Chronic respiratory failure with hypercapnia: Secondary | ICD-10-CM

## 2019-10-17 DIAGNOSIS — I5042 Chronic combined systolic (congestive) and diastolic (congestive) heart failure: Secondary | ICD-10-CM

## 2019-10-17 DIAGNOSIS — G4733 Obstructive sleep apnea (adult) (pediatric): Secondary | ICD-10-CM | POA: Diagnosis not present

## 2019-10-17 DIAGNOSIS — E662 Morbid (severe) obesity with alveolar hypoventilation: Secondary | ICD-10-CM

## 2019-10-17 DIAGNOSIS — I2721 Secondary pulmonary arterial hypertension: Secondary | ICD-10-CM

## 2019-10-17 DIAGNOSIS — I2781 Cor pulmonale (chronic): Secondary | ICD-10-CM

## 2019-10-17 DIAGNOSIS — I50812 Chronic right heart failure: Secondary | ICD-10-CM | POA: Diagnosis not present

## 2019-10-17 DIAGNOSIS — J9611 Chronic respiratory failure with hypoxia: Secondary | ICD-10-CM

## 2019-10-17 DIAGNOSIS — I4719 Other supraventricular tachycardia: Secondary | ICD-10-CM

## 2019-10-17 MED ORDER — ENTRESTO 49-51 MG PO TABS: 1.0000 | ORAL_TABLET | Freq: Two times a day (BID) | ORAL | 6 refills | Status: DC

## 2019-10-17 NOTE — Patient Instructions (Signed)
Medication Instructions:  Stop taking Losartan.  Start taking 49-51 Entresto Daily  If you need a refill on your cardiac medications before your next appointment, please call your pharmacy.   Lab work: BMET in 3-4 weeks If you have labs (blood work) drawn today and your tests are completely normal, you will receive your results only by: MyChart Message (if you have MyChart) OR A paper copy in the mail If you have any lab test that is abnormal or we need to change your treatment, we will call you to review the results.  Testing/Procedures: NONE  Follow-Up: At [facility], you and your health needs are our priority.  As part of our continuing mission to provide you with exceptional heart care, we have created designated Provider Care Teams.  These Care Teams include your primary Cardiologist (physician) and Advanced Practice Providers (APPs -  Physician Assistants and Nurse Practitioners) who all work together to provide you with the care you need, when you need it. You may see [name], MD or one of the following Advanced Practice Providers on your designated Care Team:    Azalee Course, PA-C  Micah Flesher, [place] or   Judy Pimple, [place]  Your physician wants you to follow-up in: 3-4 weeks with Edd Fabian, FNP  Any Other Special Instructions Will Be Listed Below (If Applicable). Get your lab work when you return for your visit with Edd Fabian, FNP.

## 2019-10-17 NOTE — Progress Notes (Signed)
Cardiology Office Note:    Date:  10/17/2019   ID:  Justin, Rasmussen 01-Feb-1956, MRN [id number]  PCP:  [name], PA  Cardiologist:  [name], MD  Electrophysiologist:  None   Referring MD: [name]*   Chief Complaint  Patient presents with  . Congestive Heart Failure    History of Present Illness:    Justin Rasmussen is a 64 y.o. male with a hx of recently diagnosed heart failure with reduced ejection fraction, nonischemic cardiomyopathy, on a background of chronic cor pulmonale, chronic respiratory failure on oxygen, morbid obesity and chronic elevation of the right hemidiaphragm.  He initially presented in September 2020 with massive weight gain and edema as well as shortness of breath at rest.  His ejection fraction was 25% by MRI and 30-35% by echocardiography, without evidence of late gadolinium enhancement on MRI.  Cardiac catheterization showed mild coronary atherosclerosis with no stenoses greater than 10%.  He had pulmonary hypertension of mixed etiology with a systolic PA pressure of 44 mmHg and a wedge pressure of 29 mmHg.    Treatment with ACE inhibitor's and carvedilol was initiated and then titrated by Edd Fabian, NP and his most recent echocardiogram performed on January 6 shows substantial improvement in LVEF now estimated at 40-45%.  Mitral inflow pattern showed "impaired relaxation, consistent with normal left atrial mean filling pressure.  The estimated systolic PA pressure was 36  Generally feels much improved.  Other than complaining of increased urination all day long, he has done well without any complaints dizziness, lightheadedness, syncope, palpitations, orthopnea, PND, chest pain, focal neurological complaints or intermittent claudication.  He has no edema on exam today although he says he has occasional ankle swelling.  His home scale shows roughly the same weight is at office scale at 311 pounds.  When he presented in  September he weighed 336 pounds.  Weight at hospital discharge was 298 pounds on the hospital scale.  Since that admission he has been diagnosed with obstructive sleep apnea and has undergone CPAP titration, but has yet to receive his equipment and start treatment with CPAP.  Past Medical History:  Diagnosis Date  . 2019 novel coronavirus disease (COVID-19) 07/2019  . COPD (chronic obstructive pulmonary disease) (HCC)   . HFrEF (heart failure with reduced ejection fraction) (HCC)   . Nonischemic cardiomyopathy (HCC)   . OSA (obstructive sleep apnea)   . Pulmonary hypertension (HCC)    WHO group II    Past Surgical History:  Procedure Laterality Date  . ADENOIDECTOMY    . RIGHT/LEFT HEART CATH AND CORONARY ANGIOGRAPHY N/A 06/05/2019   Procedure: RIGHT/LEFT HEART CATH AND CORONARY ANGIOGRAPHY;  Surgeon: Corky Crafts, MD;  Location: [facility] INVASIVE CV LAB;  Service: Cardiovascular;  Laterality: N/A;    Current Medications: Current Meds  Medication Sig  . albuterol (PROVENTIL) (2.5 MG/3ML) 0.083% nebulizer solution Take 3 mLs (2.5 mg total) by nebulization every 4 (four) hours as needed for wheezing or shortness of breath.  Marland Kitchen aspirin 81 MG chewable tablet Chew 1 tablet (81 mg total) by mouth daily.  . carvedilol (COREG) 6.25 MG tablet Take 1 tablet (6.25 mg total) by mouth 2 (two) times daily with a meal.  . cetirizine (ZYRTEC) 10 MG tablet Take 10 mg by mouth daily as needed for allergies or rhinitis.  . mometasone-formoterol (DULERA) 100-5 MCG/ACT AERO Inhale 2 puffs into the lungs 2 (two) times daily.  . potassium chloride (KLOR-CON) 10 MEQ tablet Take 1 tablet (  10 mEq total) by mouth daily.  Marland Kitchen spironolactone (ALDACTONE) 25 MG tablet Take 1 tablet (25 mg total) by mouth daily.  Marland Kitchen tiotropium (SPIRIVA HANDIHALER) 18 MCG inhalation capsule Place 1 capsule (18 mcg total) into inhaler and inhale daily.  Marland Kitchen torsemide (DEMADEX) 20 MG tablet Take two tablets by mouth two time daily.  .  [DISCONTINUED] losartan (COZAAR) 50 MG tablet Take 1 tablet (50 mg total) by mouth daily.     Allergies:   Patient has no known allergies.   Social History   Socioeconomic History  . Marital status: Married    Spouse name: Not on file  . Number of children: Not on file  . Years of education: Not on file  . Highest education level: Not on file  Occupational History  . Not on file  Tobacco Use  . Smoking status: Former Smoker    Packs/day: 1.00    Years: 15.00    Pack years: 15.00    Types: Cigarettes    Quit date: 05/31/1989    Years since quitting: 30.4  . Smokeless tobacco: Never Used  Substance and Sexual Activity  . Alcohol use: Not Currently  . Drug use: Never  . Sexual activity: Not on file  Other Topics Concern  . Not on file  Social History Narrative  . Not on file   Social Determinants of Health   Financial Resource Strain:   . Difficulty of Paying Living Expenses: Not on file  Food Insecurity:   . Worried About Programme researcher, broadcasting/film/video in the Last Year: Not on file  . Ran Out of Food in the Last Year: Not on file  Transportation Needs:   . Lack of Transportation (Medical): Not on file  . Lack of Transportation (Non-Medical): Not on file  Physical Activity: Unknown  . Days of Exercise per Week: Patient refused  . Minutes of Exercise per Session: Patient refused  Stress: No Stress Concern Present  . Feeling of Stress : Only a little  Social Connections: Unknown  . Frequency of Communication with Friends and Family: Not on file  . Frequency of Social Gatherings with Friends and Family: Not on file  . Attends Religious Services: Not on file  . Active Member of Clubs or Organizations: Not on file  . Attends Banker Meetings: Not on file  . Marital Status: Married     Family History: The patient's family history includes Diabetes in his father and sister; Heart attack in his father; Heart disease in his sister; Kidney disease in his mother.  ROS:    Please see the history of present illness.     All other systems reviewed and are negative.  EKGs/Labs/Other Studies Reviewed:    The following studies were reviewed today: Echocardiogram September 12, 2019 Sleep study and CPAP titration study.  EKG:  EKG is  ordered today.  The ekg ordered today demonstrates sinus rhythm, right bundle branch block (QRS 138 ms), QTC 470 ms, otherwise normal.  Recent Labs: 06/01/2019: B Natriuretic Peptide 322.5 06/03/2019: ALT 25 06/04/2019: TSH 1.966 06/05/2019: Hemoglobin 16.3; Platelets 168 06/07/2019: Magnesium 2.0 07/13/2019: BUN 22; Creatinine, Ser 0.98; Potassium 4.2; Sodium 143  Recent Lipid Panel    Component Value Date/Time   CHOL 154 06/06/2019 0457   TRIG 76 06/06/2019 0457   HDL 34 (L) 06/06/2019 0457   CHOLHDL 4.5 06/06/2019 0457   VLDL 15 06/06/2019 0457   LDLCALC 105 (H) 06/06/2019 0457    Physical Exam:  VS:  BP 128/80   Pulse 76   Ht 5\' 9"  (1.753 m)   Wt (!) 311 lb 6.4 oz (141.3 kg)   BMI 45.99 kg/m     Wt Readings from Last 3 Encounters:  10/17/19 (!) 311 lb 6.4 oz (141.3 kg)  09/23/19 (!) 308 lb (139.7 kg)  09/04/19 (!) 308 lb 6.4 oz (139.9 kg)     GEN: Morbidly obese, well nourished, well developed in no acute distress HEENT: Normal NECK: No JVD; No carotid bruits LYMPHATICS: No lymphadenopathy CARDIAC: Widely split second heart sound, RRR, no murmurs, rubs, gallops RESPIRATORY:  Clear to auscultation without rales, wheezing or rhonchi  ABDOMEN: Soft, non-tender, non-distended MUSCULOSKELETAL:  No edema; No deformity  SKIN: Warm and dry NEUROLOGIC:  Alert and oriented x 3 PSYCHIATRIC:  Normal affect   ASSESSMENT:    1. Chronic combined systolic and diastolic congestive heart failure (HCC)   2. OSA (obstructive sleep apnea)   3. Cor pulmonale, chronic (HCC)   4. Chronic right heart failure (HCC)   5. PAH (pulmonary artery hypertension) (HCC)   6. Chronic respiratory failure with hypoxia and hypercapnia  (HCC)   7. Obesity hypoventilation syndrome (HCC)   8. Morbid obesity (HCC)   9. Paroxysmal atrial tachycardia (HCC)    PLAN:    In order of problems listed above:  1. Chronic systolic heart failure: His weight is up substantially from the time of hospital discharge she has no overt physical findings to suggest hypervolemia.  He was clearly in heart failure at a weight of 330 pounds.  Reminded him about the importance of daily weight monitoring.  He should call us if he gains 3 pounds in 24 hours, 5 pounds in 1 week or if he ever exceeds 315 pounds.  We will transition from losartan to [facility].  We will bring him back in a couple of weeks to see if we can increase to the maximum dose of Entresto.  With current EF level he no longer qualifies for ICD. 2. Chronic cor pulmonale/right heart failure:   No evidence of edema today.   3. PAH: due to both left heart failure and intrinsic arteriolar disease due to chronic respiratory failure. 4. Chronic respiratory failure with hypoxia and hypercapnia: Wearing oxygen 2 L nasal cannula 24 hours a day, but seems to do okay without it at rest. 5. OSA/obesity hypoventilation syndrome: Today tried to  expedite the CPAP equipment delivery. 6. Morbid obesity: Underlying cause of most of his comorbid conditions including sleep apnea, chronic respiratory failure and pulmonary hypertension. 7. PAT:  Clinically asymptomatic.   Medication Adjustments/Labs and Tests Ordered: Current medicines are reviewed at length with the patient today.  Concerns regarding medicines are outlined above.  Orders Placed This Encounter  Procedures  . Basic metabolic panel  . EKG 12-Lead   Meds ordered this encounter  Medications  . sacubitril-valsartan (ENTRESTO) 49-51 MG    Sig: Take 1 tablet by mouth 2 (two) times daily.    Dispense:  60 tablet    Refill:  6    Patient Instructions  Medication Instructions:  Stop taking Losartan.  Start taking 49-51 Entresto  Daily  If you need a refill on your cardiac medications before your next appointment, please call your pharmacy.   Lab work: BMET in 3-4 weeks If you have labs (blood work) drawn today and your tests are completely normal, you will receive your results only by: MyChart Message (if you have MyChart) OR A paper copy in  the mail If you have any lab test that is abnormal or we need to change your treatment, we will call you to review the results.  Testing/Procedures: NONE  Follow-Up: At [facility], you and your health needs are our priority.  As part of our continuing mission to provide you with exceptional heart care, we have created designated Provider Care Teams.  These Care Teams include your primary Cardiologist (physician) and Advanced Practice Providers (APPs -  Physician Assistants and Nurse Practitioners) who all work together to provide you with the care you need, when you need it. You may see [name], MD or one of the following Advanced Practice Providers on your designated Care Team:    Azalee Course, PA-C  Micah Flesher, [place] or   Judy Pimple, [place]  Your physician wants you to follow-up in: 3-4 weeks with Edd Fabian, FNP  Any Other Special Instructions Will Be Listed Below (If Applicable). Get your lab work when you return for your visit with Edd Fabian, FNP.         Signed, [name], MD  10/17/2019 1:24 PM    [facility]

## 2019-10-22 ENCOUNTER — Telehealth: Payer: Self-pay | Admitting: Cardiovascular Disease

## 2019-10-22 NOTE — Telephone Encounter (Signed)
New Message   Pt c/o medication issue:  1. Name of Medication: sacubitril-valsartan (ENTRESTO) 49-51 MG    2. How are you currently taking this medication (dosage and times per day)?   3. Are you having a reaction (difficulty breathing--STAT)?   4. What is your medication issue? Patients wife is calling on his behalf she states that the patient is needing a prior authorization for the medication in order for it to be covered by insurance

## 2019-10-22 NOTE — Telephone Encounter (Signed)
The patient's wife confirmed the Medicaid number. Prior Authorization will be sent in for the [facility]

## 2019-10-22 NOTE — Telephone Encounter (Signed)
Left a message for the patient to call back . The office will need updated insurance information to complete the prior authorization.

## 2019-10-23 NOTE — Telephone Encounter (Signed)
The prior Justin Rasmussen has been faxed into Medicaid.

## 2019-10-26 ENCOUNTER — Other Ambulatory Visit: Payer: Self-pay | Admitting: General Practice

## 2019-10-31 NOTE — Telephone Encounter (Signed)
Call placed to check on the prior auth to [facility]. They stated they are still in the decision mode and to call back in a week if we have not heard anything.  Reference number: [id number]

## 2019-11-05 NOTE — Progress Notes (Signed)
Cardiology Clinic Note   Patient Name: Justin Rasmussen Date of Encounter: 11/09/2019  Primary Care Provider:  [name], PA Primary Cardiologist:  [name], MD  Patient Profile    Justin Rasmussen 64 year old male presents today for follow-up of his shortness of breath and congestive heart failure.  Past Medical History    Past Medical History:  Diagnosis Date  . 2019 novel coronavirus disease (COVID-19) 07/2019  . COPD (chronic obstructive pulmonary disease) (HCC)   . HFrEF (heart failure with reduced ejection fraction) (HCC)   . Nonischemic cardiomyopathy (HCC)   . OSA (obstructive sleep apnea)   . Pulmonary hypertension (HCC)    WHO group II   Past Surgical History:  Procedure Laterality Date  . ADENOIDECTOMY    . RIGHT/LEFT HEART CATH AND CORONARY ANGIOGRAPHY N/A 06/05/2019   Procedure: RIGHT/LEFT HEART CATH AND CORONARY ANGIOGRAPHY;  Surgeon: Corky Crafts, MD;  Location: [facility] INVASIVE CV LAB;  Service: Cardiovascular;  Laterality: N/A;    Allergies  No Known Allergies  History of Present Illness    Justin Rasmussen a 63 y.o.malewith a hx ofacute systolic congestive heart failure, cardiomyopathy of unknown etiology, RBBB, cor pulmonale chronic, elevated hemidiaphragm, and morbid obesity.  He was admitted [facility] on 06/01/2019 with increased dyspnea on exertion over the prior 2 to 3 weeks. He underwent cardiac catheterization on 06/05/2019 which showed nonischemic cardiomyopathy and moderate pulmonary hypertension. A cardiac MRI on 06/06/2019 showed mildly dilated left ventricle with mild left ventricular hypertrophy and an EF calculated at 16% however, by visual estimate looks closer to 25%, diffuse hypokinesis moderately dilated right ventricle with severely decreased systolic function EF 25% and no myocardial LGE (no definitive evidence of prior MI, myocarditis, or infiltrative disease). His moderate pulmonary hypertension is thought to be due to his  left heart failure and cor pulmonale, obesity hypoventilation syndrome/OSA and his morbid obesity. During that time he was also having episodes of PAT which were thought to be contributing to his heart failure as well.   He was seen by me on 06/19/2019 he  statedhe felt much better since being discharged from the hospital.. His weight had been consistent  around 293-294 pounds at home and he was 293.4 at that visit. He had been increasing his activity slowly and doing chores around his house. However, hestated he was limited due to the oxygen he was carrying around. He continued to use 3 L of oxygen with activity but states he has been able to titrate the oxygen down to 2.5 L when he is inactive.  His losartan was decreased to 25 mg daily and his carvedilol was increased to 6.25 mg twice daily.  A sleep evaluation was also ordered.  He presented to the clinic 07/13/19 and stated he was feeling better.  He continued to walk daily and had increased to 15 to 20 minutes once daily.  He continued to monitor his weight daily, 298 lbs.  He stated he had been having more convenient foods .  Educated about packaged and fast food options.    He was  using only 2 L of oxygen routinely and stated that he was continuing to use his oxygen at night while sleeping.  He did not notice any increased heart rate or palpitations.   I will increased his losartan, repeated BMP, and again in 2 weeks.  Planned to repeat echo in 2 months.  Repeat echocardiogram January 2021 showed substantial improvement with an LVEF of 40-45%, impaired relaxation, normal left atrial  filling pressures, and an estimated systolic PA pressure of 36 mmHg.  His last seen by Dr. Sallyanne Kuster on 10/17/2019.  During that time he stated he was feeling much improved.  However, he did complain of increased urination.  He denied lightheadedness, syncope, palpitations orthopnea, PND, dizziness, and chest pain.  On exam he had no lower extremity swelling.   His weight at that time was 311 pounds, when he presented in September his weight was 336 pounds and at discharge from the hospital he weighed 298 pounds.  He had undergone his CPAP titration study but had not yet received his CPAP equipment.  He presents to the clinic today and states he ran out of his Delene Loll about a week and a half ago.  He has also noticed some increased work of breathing over the past week and a half.  His weight today is up around 18 pounds.  He states that through the day he does not notice any decreased work of breathing however, at night when he lays back he feels  shortness of breath.  He states that he has not been weighing daily and has been eating more salty foods.  He does continue to walk daily for about 30 minutes.  His Delene Loll has been approved.  He continues to use 2 L of oxygen through the day and at night.  He has not received his CPAP supplies.  I will send a message to Dr. Claiborne Billings.  We will reorder his [facility] prescription. I have given him salty 60, encouraged daily weights, and will have him return in 1 week for labs and reevaluation.  He denies chest pain, increased shortness of breath,  lower extremity edema, fatigue, palpitations, melena, hematuria, hemoptysis, diaphoresis, weakness, presyncope, syncope,and PND.  Home Medications    Prior to Admission medications   Medication Sig Start Date End Date Taking? Authorizing Provider  albuterol (PROVENTIL) (2.5 MG/3ML) 0.083% nebulizer solution Take 3 mLs (2.5 mg total) by nebulization every 4 (four) hours as needed for wheezing or shortness of breath. 06/07/19   Bonnell Public, MD  aspirin 81 MG chewable tablet Chew 1 tablet (81 mg total) by mouth daily. 06/08/19   Dana Allan I, MD  carvedilol (COREG) 6.25 MG tablet TAKE 1 TABLET BY MOUTH TWICE DAILY WITH A MEAL 10/29/19   Deberah Pelton, NP  cetirizine (ZYRTEC) 10 MG tablet Take 10 mg by mouth daily as needed for allergies or rhinitis.    [provider]  mometasone-formoterol (DULERA) 100-5 MCG/ACT AERO Inhale 2 puffs into the lungs 2 (two) times daily. 06/07/19   Bonnell Public, MD  potassium chloride (KLOR-CON) 10 MEQ tablet Take 1 tablet (10 mEq total) by mouth daily. 08/27/19   Croitoru, Mihai, MD  sacubitril-valsartan (ENTRESTO) 49-51 MG Take 1 tablet by mouth 2 (two) times daily. 10/17/19   Croitoru, Mihai, MD  spironolactone (ALDACTONE) 25 MG tablet Take 1 tablet (25 mg total) by mouth daily. 08/27/19   Croitoru, Mihai, MD  tiotropium (SPIRIVA HANDIHALER) 18 MCG inhalation capsule Place 1 capsule (18 mcg total) into inhaler and inhale daily. 06/07/19 06/06/20  Dana Allan I, MD  torsemide (DEMADEX) 20 MG tablet Take two tablets by mouth two time daily. 08/27/19   Croitoru, Dani Gobble, MD    Family History    Family History  Problem Relation Age of Onset  . Kidney disease Mother   . Diabetes Father   . Heart attack Father   . Diabetes Sister   . Heart disease  Sister    He indicated that his mother is deceased. He indicated that his father is deceased. He indicated that his sister is alive.  Social History    Social History   Socioeconomic History  . Marital status: Married    Spouse name: Not on file  . Number of children: Not on file  . Years of education: Not on file  . Highest education level: Not on file  Occupational History  . Not on file  Tobacco Use  . Smoking status: Former Smoker    Packs/day: 1.00    Years: 15.00    Pack years: 15.00    Types: Cigarettes    Quit date: 05/31/1989    Years since quitting: 30.4  . Smokeless tobacco: Never Used  Substance and Sexual Activity  . Alcohol use: Not Currently  . Drug use: Never  . Sexual activity: Not on file  Other Topics Concern  . Not on file  Social History Narrative  . Not on file   Social Determinants of Health   Financial Resource Strain:   . Difficulty of Paying Living Expenses: Not on file  Food Insecurity:   . Worried About  Programme researcher, broadcasting/film/video in the Last Year: Not on file  . Ran Out of Food in the Last Year: Not on file  Transportation Needs:   . Lack of Transportation (Medical): Not on file  . Lack of Transportation (Non-Medical): Not on file  Physical Activity: Unknown  . Days of Exercise per Week: Patient refused  . Minutes of Exercise per Session: Patient refused  Stress: No Stress Concern Present  . Feeling of Stress : Only a little  Social Connections: Unknown  . Frequency of Communication with Friends and Family: Not on file  . Frequency of Social Gatherings with Friends and Family: Not on file  . Attends Religious Services: Not on file  . Active Member of Clubs or Organizations: Not on file  . Attends Banker Meetings: Not on file  . Marital Status: Married  Catering manager Violence: Unknown  . Fear of Current or Ex-Partner: Patient refused  . Emotionally Abused: Patient refused  . Physically Abused: Patient refused  . Sexually Abused: Patient refused     Review of Systems    General:  No chills, fever, night sweats or weight changes.  Cardiovascular:  No chest pain, dyspnea on exertion, edema, orthopnea, palpitations, paroxysmal nocturnal dyspnea. Dermatological: No rash, lesions/masses Respiratory: No cough, dyspnea Urologic: No hematuria, dysuria Abdominal:   No nausea, vomiting, diarrhea, bright red blood per rectum, melena, or hematemesis Neurologic:  No visual changes, wkns, changes in mental status. All other systems reviewed and are otherwise negative except as noted above.  Physical Exam    VS:  BP 122/70   Pulse 69   Temp (!) 97.3 F (36.3 C)   Ht 5\' 9"  (1.753 m)   Wt (!) 316 lb 9.6 oz (143.6 kg)   SpO2 98%   BMI 46.75 kg/m  , BMI Body mass index is 46.75 kg/m. GEN: Well nourished, well developed, in no acute distress. HEENT: normal. Neck: Supple, no JVD, carotid bruits, or masses. Cardiac: RRR, no murmurs, rubs, or gallops. No clubbing, cyanosis, edema.   Radials/DP/PT 2+ and equal bilaterally.  Respiratory:  Respirations regular and unlabored, clear to auscultation bilaterally. GI: Soft, nontender, nondistended, BS + x 4. MS: no deformity or atrophy. Skin: warm and dry, no rash. Neuro:  Strength and sensation are intact. Psych: Normal affect.  Accessory Clinical Findings    ECG personally reviewed by me today-none today  Cardiac catheterization 06/04/2017  Mid LAD lesion is 10% stenosed.  Prox RCA lesion is 10% stenosed.  LV end diastolic pressure is moderately elevated.  There is no aortic valve stenosis.  Hemodynamic findings consistent with moderate pulmonary hypertension.  Ao sat 97%, PA sat 65%, mean PA pressure 44 mm Hg; mean PCWP 29 mm Hg; CO 5.1 L/min; CI 2.08  Nonischemic cardiomyopathy.  Moderate pulmonary HTN.  Echocardiogram 09/12/2019 IMPRESSIONS    1. Left ventricular ejection fraction, by visual estimation, is 40 to  45%. The left ventricle has mildly decreased function. There is no left  ventricular hypertrophy.  2. Left ventricular diastolic parameters are consistent with Grade I  diastolic dysfunction (impaired relaxation).  3. Septal bounce present.  4. The left ventricle demonstrates global hypokinesis.  5. Global right ventricle has normal systolic function.The right  ventricular size is normal. No increase in right ventricular wall  thickness.  6. Left atrial size was normal.  7. Right atrial size was normal.  8. Presence of pericardial fat pad.  9. The mitral valve is grossly normal. Trivial mitral valve  regurgitation.  10. The tricuspid valve is grossly normal.  11. The aortic valve is tricuspid. Aortic valve regurgitation is not  visualized. No evidence of aortic valve sclerosis or stenosis.  12. The pulmonic valve was grossly normal. Pulmonic valve regurgitation is  not visualized.  13. Mildly elevated pulmonary artery systolic pressure.  14. The tricuspid regurgitant  velocity is 2.87 m/s, and with an assumed  right atrial pressure of 3 mmHg, the estimated right ventricular systolic  pressure is mildly elevated at 35.9 mmHg.  15. Changes from prior study are noted.  16. EF has improved to 40-45%.   Echocardiogram 06/02/2019 IMPRESSIONS   1. Left ventricular ejection fraction, by visual estimation, is 30 to 35%. The left ventricle has severely decreased function. Left ventricular septal wall thickness was moderately increased. There is no left ventricular hypertrophy. 2. Entire lateral wall, entire anterior wall, and apex are abnormal. 3. Elevated mean left atrial pressure. 4. Left ventricular diastolic Doppler parameters are consistent with pseudonormalization pattern of LV diastolic filling. 5. Global right ventricle has mildly reduced systolic function.The right ventricular size is severely enlarged. No increase in right ventricular wall thickness. 6. Left atrial size was moderately dilated. 7. Right atrial size was moderately dilated. 8. Mild mitral annular calcification. 9. The mitral valve is normal in structure. Trace mitral valve regurgitation. 10. The tricuspid valve is normal in structure. Tricuspid valve regurgitation is trivial. 11. The aortic valve is tricuspid Aortic valve regurgitation was not visualized by color flow Doppler. Mild aortic valve sclerosis without stenosis. 12. The pulmonic valve was not well visualized. Pulmonic valve regurgitation is not visualized by color flow Doppler. 13. Moderately elevated pulmonary artery systolic pressure. 14. The inferior vena cava is dilated in size with <50% respiratory variability, suggesting right atrial pressure of 15 mmHg.  Cardiac MRI 06/05/2019 IMPRESSION: 1. Mildly dilated LV with mild LV hypertrophy. EF calculated 16% but looks in the 25% range visually (difficult study due to motion artifact). Septal bounce with diffuse hypokinesis, suspect RV pressure/volume  overload.  2. Moderately dilated RV with severely decreased systolic function, EF 25% (visually more moderate dysfunction).  3. No myocardial LGE so no definitive evidence for prior MI, myocarditis, or infiltrative disease.  Assessment & Plan   1.  Acute on chronic systolic congestive heart failure-orthopneic at night,  continues using 2 L of oxygen.Weight today is316 pounds up from 302.8 pounds on 06/05/2019.He has been able to increase his physical activity walking 30 minutes daily.  Echocardiogram 09/2019 showed improvement in his EF to 40-45%. Increase torsemide to 60 mg twice daily for 3 days then continuetorsemide 40mg  tablet twice daily Increase potassium to 20 mEq daily for 3 days then go back to 10 mill equivalents daily Continue spironolactone 25 mg tablet daily Restart Entresto 49-51 twice daily Contniue carvedilol to 6.25 mg twice daily ContinueDaily weights-call clinic with weight increase greater than 3 pounds in 24 hours or 5 pounds in 1 week. heart healthy low-sodium diet-salty 6 given Order BMP  in 1 week  Acute on chronic cor pulmonale/HF -  Underwent CPAP titration.  Continues 2 L of oxygen at night has not received CPAP supplies. Believed to be  due to OSA and obesity-hypoventilation syndrome. We will reach out to Dr. Tresa Endo. Continue weight loss Heart healthy low-sodium diet  Paroxysmal atrial tachycardia-heart rate today  69.  Has not noticed any recent episodes of palpitations. Continue carvedilol to 6.25 mg twice daily  Obstructive sleep apnea-has not yet received CPAP supplies. Followed by Dr. Tresa Endo  Morbid obesity-weight today 316 pounds Heart healthy low-sodium diet-salty 6 given Continue weight loss Increase physical activity as tolerated   Disposition: Follow-up with me in 1 week.   Thomasene Ripple. Mirel Hundal NP-C       [facility] [address] Office [phone] Fax [phone]

## 2019-11-09 ENCOUNTER — Ambulatory Visit (INDEPENDENT_AMBULATORY_CARE_PROVIDER_SITE_OTHER): Payer: Medicaid Other | Admitting: General Practice

## 2019-11-09 ENCOUNTER — Encounter: Payer: Self-pay | Admitting: General Practice

## 2019-11-09 ENCOUNTER — Other Ambulatory Visit: Payer: Self-pay

## 2019-11-09 VITALS — BP 122/70 | HR 69 | Temp 97.3°F | Ht 69.0 in | Wt 316.6 lb

## 2019-11-09 DIAGNOSIS — I5021 Acute systolic (congestive) heart failure: Secondary | ICD-10-CM | POA: Diagnosis not present

## 2019-11-09 DIAGNOSIS — Z79899 Other long term (current) drug therapy: Secondary | ICD-10-CM | POA: Diagnosis not present

## 2019-11-09 DIAGNOSIS — I471 Supraventricular tachycardia: Secondary | ICD-10-CM

## 2019-11-09 DIAGNOSIS — I2781 Cor pulmonale (chronic): Secondary | ICD-10-CM | POA: Diagnosis not present

## 2019-11-09 DIAGNOSIS — G4733 Obstructive sleep apnea (adult) (pediatric): Secondary | ICD-10-CM

## 2019-11-09 MED ORDER — POTASSIUM CHLORIDE ER 10 MEQ PO TBCR: 10.0000 meq | EXTENDED_RELEASE_TABLET | Freq: Every day | ORAL | 3 refills | Status: DC

## 2019-11-09 MED ORDER — TORSEMIDE 20 MG PO TABS: ORAL_TABLET | ORAL | 10 refills | Status: DC

## 2019-11-09 MED ORDER — ENTRESTO 49-51 MG PO TABS: 1.0000 | ORAL_TABLET | Freq: Two times a day (BID) | ORAL | 6 refills | Status: DC

## 2019-11-09 NOTE — Patient Instructions (Addendum)
Medication Instructions:  TAKE LASIX 60mg  TWICE DAILY x3DAYS THEN BACK TO 40MG  TWICE DAILY  TAKE POTASSIUM x3DAYS THEN BACK TO DAILY If you need a refill on your cardiac medications before your next appointment, please call your pharmacy.  Labwork: BMET 3 DAYS BEFORE FOLLOW UP APPOINTMENT HERE IN OUR OFFICE AT [facility]      You will NOT need to fast   If you have labs (blood work) drawn today and your tests are completely normal, you will receive your results only by: MyChart Message (if you have MyChart) OR A paper copy in the mail If you have any lab test that is abnormal or we need to change your treatment, we will call you to review these results. You may go to any [facility] lab that is convenient for you however, we do have a lab in our office that is able to assist you. You do NOT need an appointment for our lab. Once in our office in our office lobby there is a podium where you sign-in and ring the doorbell to alert that you are here. Lab is open 8:00am and closes at 4:00pm; closes for lunch from 12:45 - 1:45pm. PLEASE BRING A COPY OF YOUR INSURANCE CARD WITH YOU.  Special Instructions: TAKE AND LOG YOUR WEIGHT DAILY AND BRING LOG BACK WTO YOUR FOLLOW UP APPOINTMENT  PLEASE READ AND FOLLOW SALTY 6 ATTACHED  Follow-Up: 1 WEEK In Person JESSE CLEAVER, FNP-C.    At [facility], you and your health needs are our priority.  As part of our continuing mission to provide you with exceptional heart care, we have created designated Provider Care Teams.  These Care Teams include your primary Cardiologist (physician) and Advanced Practice Providers (APPs -  Physician Assistants and Nurse Practitioners) who all work together to provide you with the care you need, when you need it.  Reduce your risk of getting COVID-19 With your heart disease it is especially important for people at increased risk of severe illness from COVID-19, and those who live with them, to protect themselves  from getting COVID-19. The best way to protect yourself and to help reduce the spread of the virus that causes COVID-19 is to: [place] Limit your interactions with other people as much as possible. . Take COVID-19 when you do interact with others. If you start feeling sick and think you may have COVID-19, get in touch with your healthcare provider within 24 hours.  Thank you for choosing [facility] at [facility]!!

## 2019-11-09 NOTE — Telephone Encounter (Signed)
Entresto approved through 10/2020

## 2019-11-14 LAB — BASIC METABOLIC PANEL
BUN/Creatinine Ratio: 22 (ref 10–24)
BUN: 21 mg/dL (ref 8–27)
CO2: 29 mmol/L (ref 20–29)
Calcium: 9.4 mg/dL (ref 8.6–10.2)
Chloride: 97 mmol/L (ref 96–106)
Creatinine, Ser: 0.95 mg/dL (ref 0.76–1.27)
GFR calc Af Amer: 99 mL/min/{1.73_m2} (ref >59)
GFR calc non Af Amer: 86 mL/min/{1.73_m2} (ref >59)
Glucose: 112 mg/dL — ABNORMAL HIGH (ref 65–99)
Potassium: 4.3 mmol/L (ref 3.5–5.2)
Sodium: 141 mmol/L (ref 134–144)

## 2019-11-14 NOTE — Progress Notes (Signed)
Virtual Visit via Telephone Note   This visit type was conducted due to national recommendations for restrictions regarding the COVID-19 Pandemic (e.g. social distancing) in an effort to limit this patient's exposure and mitigate transmission in our community.  Due to his co-morbid illnesses, this patient is at least at moderate risk for complications without adequate follow up.  This format is felt to be most appropriate for this patient at this time.  The patient did not have access to video technology/had technical difficulties with video requiring transitioning to audio format only (telephone).  All issues noted in this document were discussed and addressed.  No physical exam could be performed with this format.  Please refer to the patient's chart for his  consent to telehealth for [facility].  Evaluation Performed:  Follow-up visit  This visit type was conducted due to national recommendations for restrictions regarding the COVID-19 Pandemic (e.g. social distancing).  This format is felt to be most appropriate for this patient at this time.  All issues noted in this document were discussed and addressed.  No physical exam was performed (except for noted visual exam findings with Video Visits).  Please refer to the patient's chart (MyChart message for video visits and phone note for telephone visits) for the patient's consent to telehealth for [facility]  Date:  11/14/2019   ID:  Justin Rasmussen, DOB 1956-03-12, MRN [id number]  Patient Location:  [address] [place] [address]   Provider location:      [facility] [address] Office [phone] Fax [phone]   PCP:  Justin Pardon, PA  Cardiologist:  [name], MD  Electrophysiologist:  None   Chief Complaint: Follow-up  History of Present Illness:    Justin Rasmussen is a 64 y.o. male who presents via audio/video conferencing for a telehealth visit today.   Patient verified DOB and address.  The patient does not symptoms concerning for COVID-19 infection (fever, chills, cough, or new SHORTNESS OF BREATH).   Justin Cogginis a 63 y.o.malewith a hx ofacute systolic congestive heart failure, cardiomyopathy of unknown etiology, RBBB, cor pulmonale chronic, elevated hemidiaphragm, and morbid obesity.  He was admitted [facility] on 06/01/2019 with increased dyspnea on exertion over the prior 2 to 3 weeks. He underwent cardiac catheterization on 06/05/2019 which showed nonischemic cardiomyopathy and moderate pulmonary hypertension. A cardiac MRI on 06/06/2019 showed mildly dilated left ventricle with mild left ventricular hypertrophy and an EF calculated at 16% however, by visual estimate looks closer to 25%, diffuse hypokinesis moderately dilated right ventricle with severely decreased systolic function EF 25% and no myocardial LGE (no definitive evidence of prior MI, myocarditis, or infiltrative disease). His moderate pulmonary hypertension is thought to be due to his left heart failure and cor pulmonale, obesity hypoventilation syndrome/OSA and his morbid obesity. During that time he was also having episodes of PAT which were thought to be contributing to his heart failure as well.   He was seen by me on 06/19/2019 he statedhe feltmuch better since being discharged from the hospital.. His weighthad beenconsistent around 293-294 pounds at home and hewas293.4 at that visit. Hehadbeen increasing his activity slowly and doing chores around his house.However, hestatedhe waslimited due to the oxygen he wascarrying around. He continuedto use 3 L of oxygen with activity but states he has been able to titrate the oxygen down to 2.5 L when he is inactive.His losartan was decreased to 25 mg daily and his carvedilol was  increased to 6.25 mg twice daily. A sleep evaluation was also ordered.  He presented to the clinic 07/13/19 and statedhe was  feeling better. He continued to walk daily and had increased to 15 to 20 minutes once daily. He continued to monitor his weight daily, 298 lbs.He stated he had been having more convenient foods . Educated about packaged and fast food options.  He was  using only 2 L of oxygen routinely and stated that he was continuing to use his oxygen at night while sleeping. He did not notice any increased heart rate or palpitations. I will increased his losartan, repeated BMP, and again in 2 weeks. Planned to repeat echo in 2 months.  Repeat echocardiogram January 2021 showed substantial improvement with an LVEF of 40-45%, impaired relaxation, normal left atrial filling pressures, and an estimated systolic PA pressure of 36 mmHg.  His last seen by Justin Rasmussen on 10/17/2019.  During that time he stated he was feeling much improved.  However, he did complain of increased urination.  He denied lightheadedness, syncope, palpitations orthopnea, PND, dizziness, and chest pain.  On exam he had no lower extremity swelling.  His weight at that time was 311 pounds, when he presented in September his weight was 336 pounds and at discharge from the hospital he weighed 298 pounds.  He had undergone his CPAP titration study but had not yet received his CPAP equipment.  He presented to the clinic 11/09/2019 and stated he ran out of his [name] about a week and a half prior.  He had also noticed some increased work of breathing over the past week and a half.  His weight was up around 18 pounds.  He stated that through the day he did not notice any increased work of breathing however, at night when he laid back he felt  shortness of breath.  He stated that he had not been weighing daily and had been eating more salty foods.  He  continued to walk daily for about 30 minutes.  His Entresto had been approved.  He continued to use 2 L of oxygen through the day and at night.  He had not received his CPAP supplies.  I  sent a  message to Dr. Tresa Rasmussen.    He was given the salty 6 sheet, is torsemide was increased to 60 mg x 3 days, and is also given potassium 20 mEq x 3 days.  He is seen virtually today and states he was losing a pound per day while he was on his increased dose of torsemide.  However, today he weighs 318 pounds which is 2 pounds above where he was last week.  When asked about his fluid consumption he stated he is consuming around 125 ounces daily.  He feels his home scale is accurate and has been in line with the office scale.  He is trying to eat a lower sodium diet although he admits to it being somewhat difficult.  I will increase his torsemide to 60 mg twice daily for 1 week and add metolazone 2.52 times per week to his medication regimen.  We will repeat a BMP in 1 week and have him return for follow-up at that time.  He continues on 2 L of oxygen.  Today hedenies chest pain, increasedshortness of breath,increasedlower extremity edema, fatigue, palpitations, melena, hematuria, hemoptysis, diaphoresis, weakness, presyncope, syncope,and PND.  Prior CV studies:   The following studies were reviewed today:  Cardiac catheterization 06/04/2017  Mid LAD lesion is 10%  stenosed.  Prox RCA lesion is 10% stenosed.  LV end diastolic pressure is moderately elevated.  There is no aortic valve stenosis.  Hemodynamic findings consistent with moderate pulmonary hypertension.  Ao sat 97%, PA sat 65%, mean PA pressure 44 mm Hg; mean PCWP 29 mm Hg; CO 5.1 L/min; CI 2.08  Nonischemic cardiomyopathy.  Moderate pulmonary HTN.  Echocardiogram 09/12/2019 IMPRESSIONS    1. Left ventricular ejection fraction, by visual estimation, is 40 to  45%. The left ventricle has mildly decreased function. There is no left  ventricular hypertrophy.  2. Left ventricular diastolic parameters are consistent with Grade I  diastolic dysfunction (impaired relaxation).  3. Septal bounce present.  4. The left ventricle  demonstrates global hypokinesis.  5. Global right ventricle has normal systolic function.The right  ventricular size is normal. No increase in right ventricular wall  thickness.  6. Left atrial size was normal.  7. Right atrial size was normal.  8. Presence of pericardial fat pad.  9. The mitral valve is grossly normal. Trivial mitral valve  regurgitation.  10. The tricuspid valve is grossly normal.  11. The aortic valve is tricuspid. Aortic valve regurgitation is not  visualized. No evidence of aortic valve sclerosis or stenosis.  12. The pulmonic valve was grossly normal. Pulmonic valve regurgitation is  not visualized.  13. Mildly elevated pulmonary artery systolic pressure.  14. The tricuspid regurgitant velocity is 2.87 m/s, and with an assumed  right atrial pressure of 3 mmHg, the estimated right ventricular systolic  pressure is mildly elevated at 35.9 mmHg.  15. Changes from prior study are noted.  16. EF has improved to 40-45%.   Echocardiogram 06/02/2019 IMPRESSIONS   1. Left ventricular ejection fraction, by visual estimation, is 30 to 35%. The left ventricle has severely decreased function. Left ventricular septal wall thickness was moderately increased. There is no left ventricular hypertrophy. 2. Entire lateral wall, entire anterior wall, and apex are abnormal. 3. Elevated mean left atrial pressure. 4. Left ventricular diastolic Doppler parameters are consistent with pseudonormalization pattern of LV diastolic filling. 5. Global right ventricle has mildly reduced systolic function.The right ventricular size is severely enlarged. No increase in right ventricular wall thickness. 6. Left atrial size was moderately dilated. 7. Right atrial size was moderately dilated. 8. Mild mitral annular calcification. 9. The mitral valve is normal in structure. Trace mitral valve regurgitation. 10. The tricuspid valve is normal in structure. Tricuspid valve  regurgitation is trivial. 11. The aortic valve is tricuspid Aortic valve regurgitation was not visualized by color flow Doppler. Mild aortic valve sclerosis without stenosis. 12. The pulmonic valve was not well visualized. Pulmonic valve regurgitation is not visualized by color flow Doppler. 13. Moderately elevated pulmonary artery systolic pressure. 14. The inferior vena cava is dilated in size with <50% respiratory variability, suggesting right atrial pressure of 15 mmHg.  Cardiac MRI 06/05/2019 IMPRESSION: 1. Mildly dilated LV with mild LV hypertrophy. EF calculated 16% but looks in the 25% range visually (difficult study due to motion artifact). Septal bounce with diffuse hypokinesis, suspect RV pressure/volume overload.  2. Moderately dilated RV with severely decreased systolic function, EF 25% (visually more moderate dysfunction).   3. No myocardial LGE so no definitive evidence for prior MI, myocarditis, or infiltrative disease.  Past Medical History:  Diagnosis Date  . 2019 novel coronavirus disease (COVID-19) 07/2019  . COPD (chronic obstructive pulmonary disease) (HCC)   . HFrEF (heart failure with reduced ejection fraction) (HCC)   . Nonischemic cardiomyopathy (HCC)   .  OSA (obstructive sleep apnea)   . Pulmonary hypertension (HCC)    WHO group II   Past Surgical History:  Procedure Laterality Date  . ADENOIDECTOMY    . RIGHT/LEFT HEART CATH AND CORONARY ANGIOGRAPHY N/A 06/05/2019   Procedure: RIGHT/LEFT HEART CATH AND CORONARY ANGIOGRAPHY;  Surgeon: Corky Crafts, MD;  Location: [facility] INVASIVE CV LAB;  Service: Cardiovascular;  Laterality: N/A;     No outpatient medications have been marked as taking for the 11/15/19 encounter (Appointment) with Ronney Asters, NP.     Allergies:   Patient has no known allergies.   Social History   Tobacco Use  . Smoking status: Former Smoker    Packs/day: 1.00    Years: 15.00    Pack years: 15.00    Types: Cigarettes     Quit date: 05/31/1989    Years since quitting: 30.4  . Smokeless tobacco: Never Used  Substance Use Topics  . Alcohol use: Not Currently  . Drug use: Never     Family Hx: The patient's family history includes Diabetes in his father and sister; Heart attack in his father; Heart disease in his sister; Kidney disease in his mother.  ROS:   Please see the history of present illness.     All other systems reviewed and are negative.   Labs/Other Tests and Data Reviewed:    Recent Labs: 06/01/2019: B Natriuretic Peptide 322.5 06/03/2019: ALT 25 06/04/2019: TSH 1.966 06/05/2019: Hemoglobin 16.3; Platelets 168 06/07/2019: Magnesium 2.0 11/13/2019: BUN 21; Creatinine, Ser 0.95; Potassium 4.3; Sodium 141   Recent Lipid Panel Lab Results  Component Value Date/Time   CHOL 154 06/06/2019 04:57 AM   TRIG 76 06/06/2019 04:57 AM   HDL 34 (L) 06/06/2019 04:57 AM   CHOLHDL 4.5 06/06/2019 04:57 AM   LDLCALC 105 (H) 06/06/2019 04:57 AM    Wt Readings from Last 3 Encounters:  11/09/19 (!) 316 lb 9.6 oz (143.6 kg)  10/17/19 (!) 311 lb 6.4 oz (141.3 kg)  09/23/19 (!) 308 lb (139.7 kg)     Exam:    Vital Signs:  There were no vitals taken for this visit.   Well nourished, well developed male in no  acute distress.   ASSESSMENT & PLAN:    1.  Acute on chronic systolic congestive heart failure-no increased work of breathing,  continues using 2L of oxygen.Weight today is318 pounds, 316 pounds 11/09/2019, upfrom 302.8 pounds on 06/05/2019.He has continues to be physical activitywalking 30 minutes daily.  Echocardiogram 09/2019 showed improvement in his EF to 40-45%.   Increase torsemide to 60 mg twice daily for 1 week  Increase potassium to 20 mEq daily for 1 week  Start metolazone 2.5 mg 2 times per week Continue spironolactone 25 mg tablet daily Continue Entresto 49-51 twice daily Contniuecarvedilol to 6.25 mg twice daily ContinueDaily weights-call clinic with weight increase  greater than 3 pounds in 24 hours or 5 pounds in 1 week. heart healthy low-sodium diet-salty 6 given Start fluid restriction 48 ounces daily Order BMPin 1 week  Acute on chronic cor pulmonale/HF -  Underwent CPAP titration.  Continues 2 L of oxygen at night has not received CPAP supplies. Believed to be  due to OSA and obesity-hypoventilation syndrome. Continue weight loss Heart healthy low-sodium diet  Paroxysmal atrial tachycardia-heart rate today81. Has not noticed any recent episodes of palpitations. Continuecarvedilol to 6.25 mg twice daily  Obstructive sleep apnea-has not yet received CPAP supplies. Followed by Dr. Tresa Rasmussen  Morbid obesity-weight today 318pounds Heart  healthy low-sodium diet-salty 6 given Continue weight loss Increase physical activity as tolerated  Disposition: Follow-up with  me in 1 week.  COVID-19 Education: The signs and symptoms of COVID-19 were discussed with the patient and how to seek care for testing (follow up with PCP or arrange E-visit).  The importance of social distancing was discussed today.  Patient Risk:   After full review of this patients clinical status, I feel that they are at least moderate risk at this time.  Time:   Today, I have spent 15 minutes with the patient with telehealth technology discussing fluid restriction, low-sodium diet, and medication.     Medication Adjustments/Labs and Tests Ordered: Current medicines are reviewed at length with the patient today.  Concerns regarding medicines are outlined above.   Tests Ordered: No orders of the defined types were placed in this encounter.  Medication Changes: No orders of the defined types were placed in this encounter.   Disposition:  in 1 week(s)  Signed,  Thomasene Ripple. Mannie Ohlin NP-C     [facility] [address] Office [phone] Fax [phone]

## 2019-11-15 ENCOUNTER — Telehealth (INDEPENDENT_AMBULATORY_CARE_PROVIDER_SITE_OTHER): Payer: Medicaid Other | Admitting: General Practice

## 2019-11-15 VITALS — BP 151/73 | HR 81 | Wt 318.0 lb

## 2019-11-15 DIAGNOSIS — I5021 Acute systolic (congestive) heart failure: Secondary | ICD-10-CM

## 2019-11-15 DIAGNOSIS — I471 Supraventricular tachycardia: Secondary | ICD-10-CM

## 2019-11-15 DIAGNOSIS — Z79899 Other long term (current) drug therapy: Secondary | ICD-10-CM

## 2019-11-15 DIAGNOSIS — G4733 Obstructive sleep apnea (adult) (pediatric): Secondary | ICD-10-CM

## 2019-11-15 DIAGNOSIS — I509 Heart failure, unspecified: Secondary | ICD-10-CM

## 2019-11-15 MED ORDER — METOLAZONE 2.5 MG PO TABS: 2.5000 mg | ORAL_TABLET | ORAL | 3 refills | Status: DC

## 2019-11-15 MED ORDER — POTASSIUM CHLORIDE ER 20 MEQ PO TBCR: 20.0000 meq | EXTENDED_RELEASE_TABLET | Freq: Every day | ORAL | 3 refills | Status: DC

## 2019-11-15 MED ORDER — TORSEMIDE 20 MG PO TABS: 60.0000 mg | ORAL_TABLET | Freq: Two times a day (BID) | ORAL | 3 refills | Status: DC

## 2019-11-15 NOTE — Patient Instructions (Signed)
Medication Instructions:  TAKE TORSEMIDE 60MG  TWICE DAILY  START METOLAZONE 2.5MG  TWICE A WEEK. TAKE Friday, Monday AND Wednesday. TAKE 30 MINUTES BEFORE TAKING TORSEMIDE  TAKE POTASSIUM 20 MEQ DAILY  If you need a refill on your cardiac medications before your next appointment, please call your pharmacy.  Labwork: BMET ON TUESDAY HERE IN OUR OFFICE AT [facility]     You will NOT need to fast   If you have labs (blood work) drawn today and your tests are completely normal, you will receive your results only by: Tuesday MyChart Message (if you have MyChart) OR A paper copy in the mail If you have any lab test that is abnormal or we need to change your treatment, we will call you to review these results. You may go to any [facility] lab that is convenient for you however, we do have a lab in our office that is able to assist you. You do NOT need an appointment for our lab. Once in our office in our office lobby there is a podium where you sign-in and ring the doorbell to alert Marland Kitchen that you are here. Lab is open 8:00am and closes at 4:00pm; closes for lunch from 12:45 - 1:45pm. PLEASE BRING A COPY OF YOUR INSURANCE CARD WITH YOU.  Special Instructions: 48 OZ FLUID RESTRICTION-THIS INCLUDES ALL FLUIDS WATER, COFFEE, TEA AND SODA-LOG DAILY  LOG WEIGHT AND FLUIDS DAILY AND BRING WITH YOU TO FOLLOW UP APPOINTMENT  PLEASE READ AND FOLLOW SALTY 6 ATTACHED  Follow-Up: 11-22-2019 AT NOON  PLEASE ARRIVE EARLY WEARING A MASK In Person JESSE CLEAVER, FNP-C.    At [facility], you and your health needs are our priority.  As part of our continuing mission to provide you with exceptional heart care, we have created designated Provider Care Teams.  These Care Teams include your primary Cardiologist (physician) and Advanced Practice Providers (APPs -  Physician Assistants and Nurse Practitioners) who all work together to provide you with the care you need, when you need it.  Reduce your risk of getting  COVID-19 With your heart disease it is especially important for people at increased risk of severe illness from COVID-19, and those who live with them, to protect themselves from getting COVID-19. The best way to protect yourself and to help reduce the spread of the virus that causes COVID-19 is to: [facility] Limit your interactions with other people as much as possible. . Take COVID-19 when you do interact with others. If you start feeling sick and think you may have COVID-19, get in touch with your healthcare provider within 24 hours.  Thank you for choosing [facility] at [facility]!!

## 2019-11-15 NOTE — Progress Notes (Signed)
Thanks, Verdon Cummins. Agree with the extra diuretics

## 2019-11-16 ENCOUNTER — Other Ambulatory Visit: Payer: Self-pay

## 2019-11-16 ENCOUNTER — Other Ambulatory Visit (HOSPITAL_COMMUNITY)
Admission: RE | Admit: 2019-11-16 | Discharge: 2019-11-16 | Disposition: A | Discharge status: Home / Self Care | Payer: Medicaid Other | Source: Ambulatory Visit | Attending: Critical Care Medicine | Admitting: Critical Care Medicine

## 2019-11-16 DIAGNOSIS — Z20822 Contact with and (suspected) exposure to covid-19: Secondary | ICD-10-CM | POA: Insufficient documentation

## 2019-11-16 DIAGNOSIS — Z01812 Encounter for preprocedural laboratory examination: Secondary | ICD-10-CM | POA: Insufficient documentation

## 2019-11-16 LAB — SARS CORONAVIRUS 2 (TAT 6-24 HRS): SARS Coronavirus 2: NEGATIVE

## 2019-11-16 NOTE — Telephone Encounter (Signed)
On last OV under A&P it was stated to start Metolazone 2.5 mg 2 times a week, however under medication instructions it states to start Metolazone 2.5 mg twice a week, take Friday, Monday, and Wednesday.  Which is correct to use?  Thank you!

## 2019-11-18 MED ORDER — METOLAZONE 2.5 MG PO TABS: 2.5000 mg | ORAL_TABLET | ORAL | 3 refills | Status: DC

## 2019-11-19 ENCOUNTER — Ambulatory Visit (INDEPENDENT_AMBULATORY_CARE_PROVIDER_SITE_OTHER): Payer: Medicaid Other | Admitting: Critical Care Medicine

## 2019-11-19 ENCOUNTER — Ambulatory Visit: Payer: Medicaid Other | Admitting: Critical Care Medicine

## 2019-11-19 ENCOUNTER — Other Ambulatory Visit: Payer: Self-pay

## 2019-11-19 ENCOUNTER — Encounter: Payer: Self-pay | Admitting: Critical Care Medicine

## 2019-11-19 VITALS — BP 100/50 | HR 72 | Temp 97.1°F | Ht 68.0 in | Wt 314.0 lb

## 2019-11-19 DIAGNOSIS — J9611 Chronic respiratory failure with hypoxia: Secondary | ICD-10-CM

## 2019-11-19 DIAGNOSIS — J984 Other disorders of lung: Secondary | ICD-10-CM

## 2019-11-19 DIAGNOSIS — I5020 Unspecified systolic (congestive) heart failure: Secondary | ICD-10-CM | POA: Diagnosis not present

## 2019-11-19 DIAGNOSIS — E662 Morbid (severe) obesity with alveolar hypoventilation: Secondary | ICD-10-CM

## 2019-11-19 LAB — PULMONARY FUNCTION TEST
DL/VA % pred: 112 %
DL/VA: 4.79 ml/min/mmHg/L
DLCO cor % pred: 68 %
DLCO cor: 17.66 ml/min/mmHg
DLCO unc % pred: 68 %
DLCO unc: 17.71 ml/min/mmHg
FEF 25-75 Post: 2.32 L/sec
FEF 25-75 Pre: 1.87 L/sec
FEF2575-%Change-Post: 23 %
FEF2575-%Pred-Post: 84 %
FEF2575-%Pred-Pre: 68 %
FEV1-%Change-Post: 5 %
FEV1-%Pred-Post: 61 %
FEV1-%Pred-Pre: 58 %
FEV1-Post: 2.06 L
FEV1-Pre: 1.94 L
FEV1FVC-%Change-Post: 3 %
FEV1FVC-%Pred-Pre: 103 %
FEV6-%Change-Post: 2 %
FEV6-%Pred-Post: 60 %
FEV6-%Pred-Pre: 59 %
FEV6-Post: 2.54 L
FEV6-Pre: 2.47 L
FEV6FVC-%Pred-Post: 105 %
FEV6FVC-%Pred-Pre: 105 %
FVC-%Change-Post: 2 %
FVC-%Pred-Post: 57 %
FVC-%Pred-Pre: 56 %
FVC-Post: 2.54 L
FVC-Pre: 2.47 L
Post FEV1/FVC ratio: 81 %
Post FEV6/FVC ratio: 100 %
Pre FEV1/FVC ratio: 78 %
Pre FEV6/FVC Ratio: 100 %
RV % pred: 107 %
RV: 2.31 L
TLC % pred: 69 %
TLC: 4.6 L

## 2019-11-19 MED ORDER — FLUTICASONE PROPIONATE 50 MCG/ACT NA SUSP: 2.0000 | Freq: Every day | NASAL | 11 refills | Status: DC

## 2019-11-19 MED ORDER — TIOTROPIUM BROMIDE MONOHYDRATE 18 MCG IN CAPS: 18.0000 ug | ORAL_CAPSULE | Freq: Every day | RESPIRATORY_TRACT | 11 refills | Status: DC

## 2019-11-19 NOTE — Progress Notes (Signed)
Full PFT performed today. °

## 2019-11-19 NOTE — Patient Instructions (Addendum)
Thank you for visiting Dr. Chestine Spore at [facility]. We recommend the following: No orders of the defined types were placed in this encounter.  No orders of the defined types were placed in this encounter.   Meds ordered this encounter  Medications  . fluticasone (FLONASE) 50 MCG/ACT nasal spray    Sig: Place 2 sprays into both nostrils daily.    Dispense:  16 g    Refill:  11  . tiotropium (SPIRIVA) 18 MCG inhalation capsule    Sig: Place 1 capsule (18 mcg total) into inhaler and inhale daily.    Dispense:  30 capsule    Refill:  11    Return in about 2 months (around 01/19/2020).   Call if you are having worse symptoms off Dulera and need to go back on it. We can prescribe it at that time.     Please do your part to reduce the spread of COVID-19.

## 2019-11-19 NOTE — Progress Notes (Signed)
Synopsis: Referred in December 2020 for chronic hypoxic respiratory failure by Justin Rasmussen*.  Subjective:   PATIENT ID: Justin Rasmussen GENDER: male DOB: [birth year], MRN: [id number]  Chief Complaint  Patient presents with  . Follow-up    Patient is here for ROV after PFT. Patient is on 2 liters Oxygen all the time.     Mr. Trucks is a 64 year old gentleman with a history of obesity, chronic hypoxic respiratory failure, and HFrEF.  He has been seen multiple times recently by cardiology, [name] on 11/09/2019 (note reviewed) where he had been off Entresto and had been less diligent with his diet.  At that time he gained 18 pounds and was having increasing shortness of breath.  A follow-up televisit on 3/11 indicated that he was continuing to gain weight despite increased diuretics, so diuretics were again increased with the addition of metolazone.  He follows with Dr. Tresa Endo for OSA who was prescribed CPAP 10 cmH2O, but he has not yet received his machine.  He remains on triple inhaled therapy (Dulera and Spiriva).  PFTs reviewed today.  He has not used albuterol since his last visit.  He denies wheezing, cough, sputum production, chest congestion.  Overall his breathing feels normal.  In the past when he has had wheezing it is unclear if it is due to COPD or heart failure.  He continues to use 2 L of oxygen with activity, but does not always use it at rest.  If he takes his oxygen off he monitors his saturations to ensure they remain above 90%.  He was walked in the office at his last visit to qualify for oxygen.  He has a history of spring and fall allergies with nasal symptoms.  He has been taking Zyrtec as needed, but needing it recently.  He has tried [employer] as an alternative to Zyrtec in the past, but never tried them together.    OV 09/04/2019: Justin Rasmussen is a 64 year old gentleman who was referred for management of chronic respiratory failure.  He was started on 2 L  supplemental oxygen when he was hospitalized in September 2020 for newly diagnosed HFrEF.  During that admission he lost about 50 pounds with diuresis.  Heart catheterization demonstrated nonischemic cardiomyopathy.  He has been continued on guideline directed heart failure treatment, and has continued 2 L of supplemental oxygen.  Over the last few months his saturations on 2 L have improved-often now in the upper 90s at rest.  He developed COVID-19 in late November, and at the time was in the lower 90s on his supplemental oxygen, but recovered well without residual symptoms.  Prior to his hospitalization he had wheezing, and during his hospitalization he was diagnosed with COPD.  He was started on Dulera twice daily and Spiriva once daily prior to discharge.  He was also instructed to use his albuterol nebulizer twice daily, all of which he is continued.  He has a history of smoking 1 pack/day x 15 years prior to quitting in 1990.  He has no previous history of asthma or other lung disease, and there is no family history of lung disease.  He formerly worked as an Personnel officer, but is unsure if he had asbestos exposure.  Currently he has dyspnea on exertion, but can walk around a store like [employer] or [employer], and thinks he could walk about a block outside.  He does not get short of breath with ADLs.  He is no longer wheezing.  He only has coughing  when his allergies are bad.  He uses Flonase and Zyrtec to control his allergies as needed.  He has tried stopping his oxygen at home for up to about an hour, and reports that his saturations have stayed 90 or above.  His lower extremity edema has been resolved since his admission.  He was diagnosed with OSA, and has a titration sleep study pending.  This was delayed when he developed Covid.      Past Medical History:  Diagnosis Date  . 2019 novel coronavirus disease (COVID-19) 07/2019  . COPD (chronic obstructive pulmonary disease) (HCC)   . HFrEF (heart  failure with reduced ejection fraction) (HCC)   . Nonischemic cardiomyopathy (HCC)   . OSA (obstructive sleep apnea)   . Pulmonary hypertension (HCC)    WHO group II     Family History  Problem Relation Age of Onset  . Kidney disease Mother   . Diabetes Father   . Heart attack Father   . Diabetes Sister   . Heart disease Sister      Past Surgical History:  Procedure Laterality Date  . ADENOIDECTOMY    . RIGHT/LEFT HEART CATH AND CORONARY ANGIOGRAPHY N/A 06/05/2019   Procedure: RIGHT/LEFT HEART CATH AND CORONARY ANGIOGRAPHY;  Surgeon: Corky Crafts, MD;  Location: [facility] INVASIVE CV LAB;  Service: Cardiovascular;  Laterality: N/A;    Social History   Socioeconomic History  . Marital status: Married    Spouse name: Not on file  . Number of children: Not on file  . Years of education: Not on file  . Highest education level: Not on file  Occupational History  . Not on file  Tobacco Use  . Smoking status: Former Smoker    Packs/day: 1.00    Years: 15.00    Pack years: 15.00    Types: Cigarettes    Quit date: 05/31/1989    Years since quitting: 30.4  . Smokeless tobacco: Never Used  Substance and Sexual Activity  . Alcohol use: Not Currently  . Drug use: Never  . Sexual activity: Not on file  Other Topics Concern  . Not on file  Social History Narrative  . Not on file   Social Determinants of Health   Financial Resource Strain:   . Difficulty of Paying Living Expenses:   Food Insecurity:   . Worried About Programme researcher, broadcasting/film/video in the Last Year:   . Barista in the Last Year:   Transportation Needs:   . Freight forwarder (Medical):   Marland Kitchen Lack of Transportation (Non-Medical):   Physical Activity: Unknown  . Days of Exercise per Week: Patient refused  . Minutes of Exercise per Session: Patient refused  Stress: No Stress Concern Present  . Feeling of Stress : Only a little  Social Connections: Unknown  . Frequency of Communication with Friends and  Family: Not on file  . Frequency of Social Gatherings with Friends and Family: Not on file  . Attends Religious Services: Not on file  . Active Member of Clubs or Organizations: Not on file  . Attends Banker Meetings: Not on file  . Marital Status: Married  Catering manager Violence: Unknown  . Fear of Current or Ex-Partner: Patient refused  . Emotionally Abused: Patient refused  . Physically Abused: Patient refused  . Sexually Abused: Patient refused     No Known Allergies   Immunization History  Administered Date(s) Administered  . Influenza,inj,Quad PF,6+ Mos 06/02/2019    Outpatient Medications  Prior to Visit  Medication Sig Dispense Refill  . aspirin 81 MG chewable tablet Chew 1 tablet (81 mg total) by mouth daily. 30 tablet 0  . carvedilol (COREG) 6.25 MG tablet TAKE 1 TABLET BY MOUTH TWICE DAILY WITH A MEAL 180 tablet 3  . cetirizine (ZYRTEC) 10 MG tablet Take 10 mg by mouth daily as needed for allergies or rhinitis.    Marland Kitchen metolazone (ZAROXOLYN) 2.5 MG tablet Take 1 tablet (2.5 mg total) by mouth 2 (two) times a week. TAKE Friday, Monday AND Wednesday 15 tablet 3  . mometasone-formoterol (DULERA) 100-5 MCG/ACT AERO Inhale 2 puffs into the lungs 2 (two) times daily. 13 g 1  . potassium chloride 20 MEQ TBCR Take 20 mEq by mouth daily. X3D THEN BACK TO 30 tablet 3  . sacubitril-valsartan (ENTRESTO) 49-51 MG Take 1 tablet by mouth 2 (two) times daily. 60 tablet 6  . spironolactone (ALDACTONE) 25 MG tablet Take 1 tablet (25 mg total) by mouth daily. 90 tablet 3  . tiotropium (SPIRIVA HANDIHALER) 18 MCG inhalation capsule Place 1 capsule (18 mcg total) into inhaler and inhale daily. 90 capsule 3  . torsemide (DEMADEX) 20 MG tablet Take 3 tablets (60 mg total) by mouth 2 (two) times daily. 30 tablet 3   No facility-administered medications prior to visit.    Review of Systems  Constitutional: Negative for chills and fever.  HENT: Negative.         Occasional postnasal drip and congestion  Respiratory: Positive for shortness of breath. Negative for cough and wheezing.   Cardiovascular: Negative for chest pain and leg swelling.  Gastrointestinal: Negative.   Genitourinary: Negative.   Musculoskeletal: Negative.   Skin: Negative.   Neurological: Negative.   Endo/Heme/Allergies: Positive for environmental allergies.     Objective:   Vitals:   11/19/19 1002  BP: (!) 100/50  Pulse: 72  Temp: (!) 97.1 F (36.2 C)  TempSrc: Temporal  SpO2: 96%  Weight: (!) 314 lb (142.4 kg)  Height: 5\' 8"  (1.727 m)   96% on 2 LPM  O2 BMI Readings from Last 3 Encounters:  11/19/19 47.74 kg/m  11/15/19 46.96 kg/m  11/09/19 46.75 kg/m   Wt Readings from Last 3 Encounters:  11/19/19 (!) 314 lb (142.4 kg)  11/15/19 (!) 318 lb (144.2 kg)  11/09/19 (!) 316 lb 9.6 oz (143.6 kg)    Physical Exam Vitals reviewed.  Constitutional:      Appearance: Normal appearance. He is obese.  HENT:     Head: Normocephalic and atraumatic.  Eyes:     General: No scleral icterus. Cardiovascular:     Rate and Rhythm: Normal rate and regular rhythm.  Pulmonary:     Comments: Bibasilar rales, no wheezing.  Breathing comfortably on 2 L nasal cannula. Abdominal:     General: There is no distension.     Palpations: Abdomen is soft.  Musculoskeletal:        General: No swelling or deformity.     Cervical back: Neck supple.  Lymphadenopathy:     Cervical: No cervical adenopathy.  Skin:    General: Skin is warm and dry.     Findings: No rash.  Neurological:     General: No focal deficit present.     Mental Status: He is alert.     Coordination: Coordination normal.  Psychiatric:        Mood and Affect: Mood normal.        Behavior: Behavior normal.  CBC    Component Value Date/Time   WBC 9.1 06/05/2019 0547   RBC 5.17 06/05/2019 0547   HGB 16.3 06/05/2019 0800   HCT 48.0 06/05/2019 0800   PLT 168 06/05/2019 0547   MCV 92.3 06/05/2019  0547   MCH 28.4 06/05/2019 0547   MCHC 30.8 06/05/2019 0547   RDW 14.3 06/05/2019 0547   LYMPHSABS 1.1 06/05/2019 0547   MONOABS 1.0 06/05/2019 0547   EOSABS 0.2 06/05/2019 0547   BASOSABS 0.0 06/05/2019 0547    CHEMISTRY Recent Labs  Lab 11/13/19 1337  NA 141  K 4.3  CL 97  CO2 29  GLUCOSE 112*  BUN 21  CREATININE 0.95  CALCIUM 9.4   Estimated Creatinine Clearance: 113.2 mL/min (by C-G formula based on SCr of 0.95 mg/dL).  VBG 06/05/2019 (during heart catheterization) - 7.30/ 78/ 39/ 39  08/03/2019 SARS-CoV-2 positive   Chest Imaging- films reviewed: 06/01/2019 CXR, 1 view- Right-sided pleural effusion, widened mediastinum, especially in the right hemithorax, but this could be partially artifact related to rotation.  Increased interstitial markings bilaterally.   Pulmonary Functions Testing Results: PFT Results Latest Ref Rng & Units 11/19/2019  FVC-Pre L 2.47  FVC-Predicted Pre % 56  FVC-Post L 2.54  FVC-Predicted Post % 57  Pre FEV1/FVC % % 78  Post FEV1/FCV % % 81  FEV1-Pre L 1.94  FEV1-Predicted Pre % 58  FEV1-Post L 2.06  DLCO UNC% % 68  DLCO COR %Predicted % 112  TLC L 4.60  TLC % Predicted % 69  RV % Predicted % 107   2021- No significant obstruction or bronchodilator reversibility.  Mild restriction with significantly reduced ERV and preserved RV.  Mild diffusion impairment.  Flow volume loop supports restriction.  Echocardiogram 06/02/2019: LV EF 30 to 35%, moderately increased LV septal wall thickness but no LVH.  Regional wall motion abnormalities of the lateral, anterior walls and apex.  Increased left atrial pressure.  Diastolic dysfunction.  Reduced global RV systolic function with severe dilation.  Moderately dilated atria bilaterally.  Mild aortic sclerosis without stenosis, trace MR and TR.  Elevated right atrial pressure.  Echocardiogram 09/12/2019: LVEF 40 to 45%, mildly decreased LV function, grade 1 diastolic dysfunction.  Global hypokinesis.   Normal LA, RV, RA.  Trivial MR, otherwise normal valves.   Heart Catheterization 06/05/2019: Mid LAD lesion is 10% stenosed.  Prox RCA lesion is 10% stenosed.  LV end diastolic pressure is moderately elevated.  There is no aortic valve stenosis.  Hemodynamic findings consistent with moderate pulmonary hypertension.  Ao sat 97%, PA sat 65%, mean PA pressure 44 mm Hg; mean PCWP 29 mm Hg; CO 5.1 L/min; CI 2.08 Nonischemic cardiomyopathy. Moderate pulmonary HTN.    Assessment & Plan:     ICD-10-CM   1. Chronic respiratory failure with hypoxia (HCC)  J96.11   2. HFrEF (heart failure with reduced ejection fraction) (HCC)  I50.20   3. Obesity hypoventilation syndrome (HCC)  E66.2   4. Restrictive lung disease  J98.4       Chronic hypoxic respiratory failure due to compensated chronic HFrEF.  Likely obesity hypoventilation syndrome with VBG demonstrating CO2 in the 70s and his heart catheterization. -Continue 2 L supplemental oxygen with ambulation and sleep.  Agree with monitoring saturations when not wearing his oxygen at rest. -Continue follow-up and medications per cardiology management--agree with escalating doses of diuretics. -Recommend long-term goal of modest weight loss -Recommend Covid vaccine when it is available to him.  Up-to-date on seasonal flu  shot.  Needs pneumonia shots.  History of wheezing. Unclear if he has COPD or if his wheezing was a symptom of decompensated heart failure, which seems more likely. -Stop Dulera and monitor symptoms.  Okay to continue Spiriva for now.  If symptoms recur, can restart Dulera. -If doing well at follow-up in 3 months, will stop Spiriva.  Allergic rhinitis -Recommend intranasal steroids and cetirizine during allergy season.   RTC in 3 months or sooner as needed.    Current Outpatient Medications:  .  aspirin 81 MG chewable tablet, Chew 1 tablet (81 mg total) by mouth daily., Disp: 30 tablet, Rfl: 0 .  carvedilol (COREG) 6.25  MG tablet, TAKE 1 TABLET BY MOUTH TWICE DAILY WITH A MEAL, Disp: 180 tablet, Rfl: 3 .  cetirizine (ZYRTEC) 10 MG tablet, Take 10 mg by mouth daily as needed for allergies or rhinitis., Disp: , Rfl:  .  metolazone (ZAROXOLYN) 2.5 MG tablet, Take 1 tablet (2.5 mg total) by mouth 2 (two) times a week. TAKE Friday, Monday AND Wednesday, Disp: 15 tablet, Rfl: 3 .  mometasone-formoterol (DULERA) 100-5 MCG/ACT AERO, Inhale 2 puffs into the lungs 2 (two) times daily., Disp: 13 g, Rfl: 1 .  potassium chloride 20 MEQ TBCR, Take 20 mEq by mouth daily. X3D THEN BACK TO , Disp: 30 tablet, Rfl: 3 .  sacubitril-valsartan (ENTRESTO) 49-51 MG, Take 1 tablet by mouth 2 (two) times daily., Disp: 60 tablet, Rfl: 6 .  spironolactone (ALDACTONE) 25 MG tablet, Take 1 tablet (25 mg total) by mouth daily., Disp: 90 tablet, Rfl: 3 .  tiotropium (SPIRIVA HANDIHALER) 18 MCG inhalation capsule, Place 1 capsule (18 mcg total) into inhaler and inhale daily., Disp: 90 capsule, Rfl: 3 .  torsemide (DEMADEX) 20 MG tablet, Take 3 tablets (60 mg total) by mouth 2 (two) times daily., Disp: 30 tablet, Rfl: 3   Steffanie Dunn, DO  Pulmonary Critical Care 11/19/2019 10:12 AM

## 2019-11-20 MED ORDER — METOLAZONE 2.5 MG PO TABS: 2.5000 mg | ORAL_TABLET | Freq: Every day | ORAL | 3 refills | Status: DC | PRN

## 2019-11-20 NOTE — Telephone Encounter (Signed)
D/W JESSE, SINCE THIS WAS NOT FILLED LAST WEEK HAVE PT TAKE TODAY AND TOMORROW THEN STOP. THEN WE WILL DISCUSS FURTHER AT UPCOMING APPT 11-22-2018. S/W PHARMACIST SHE WANTED FURTHER CLARIFICATION FOR METOLAZONE, ALL QUESTIONS ANSWERED. SHE WILL FILL AND NOTIFY PT WHEN READY. D/W PT INFORMED OF JESSE'S MESSAGE ABOVE. PT VERBALIZED INSTRUCTION. WILL DISCUSS FURTHER AT UPCOMING APPT

## 2019-11-21 ENCOUNTER — Other Ambulatory Visit: Payer: Self-pay

## 2019-11-21 MED ORDER — METOLAZONE 2.5 MG PO TABS: 2.5000 mg | ORAL_TABLET | Freq: Every day | ORAL | 3 refills | Status: DC | PRN

## 2019-11-21 NOTE — Progress Notes (Signed)
Cardiology Clinic Note   Patient Name: Justin Rasmussen Date of Encounter: 11/22/2019  Primary Care Provider:  Wynn Banker, PA Primary Cardiologist:  Sanda Klein, MD  Patient Profile    Justin Rasmussen 64 year old male presents today for follow-up of his shortness of breath and congestive heart failure.  Past Medical History    Past Medical History:  Diagnosis Date  . 2019 novel coronavirus disease (COVID-19) 07/2019  . COPD (chronic obstructive pulmonary disease) ([place])   . HFrEF (heart failure with reduced ejection fraction) ([place])   . Nonischemic cardiomyopathy ([place])   . OSA (obstructive sleep apnea)   . Pulmonary hypertension ([place])    WHO group II   Past Surgical History:  Procedure Laterality Date  . ADENOIDECTOMY    . RIGHT/LEFT HEART CATH AND CORONARY ANGIOGRAPHY N/A 06/05/2019   Procedure: RIGHT/LEFT HEART CATH AND CORONARY ANGIOGRAPHY;  Surgeon: Jettie Booze, MD;  Location: [facility];  Service: Cardiovascular;  Laterality: N/A;    Allergies  No Known Allergies  History of Present Illness    [name] a 64 y.o.malewith a hx ofacute systolic congestive heart failure, cardiomyopathy of unknown etiology, RBBB, cor pulmonale chronic, elevated hemidiaphragm, and morbid obesity.  He was admitted [facility] on 06/01/2019 with increased dyspnea on exertion over the prior 2 to 3 weeks. He underwent cardiac catheterization on 06/05/2019 which showed nonischemic cardiomyopathy and moderate pulmonary hypertension. A cardiac MRI on 06/06/2019 showed mildly dilated left ventricle with mild left ventricular hypertrophy and an EF calculated at 16% however, by visual estimate looks closer to 25%, diffuse hypokinesis moderately dilated right ventricle with severely decreased systolic function EF 00% and no myocardial LGE (no definitive evidence of prior MI, myocarditis, or infiltrative disease). His moderate pulmonary hypertension is thought to be due to  his left heart failure and cor pulmonale, obesity hypoventilation syndrome/OSA and his morbid obesity. During that time he was also having episodes of PAT which were thought to be contributing to his heart failure as well.   He was seen by me on 06/19/2019 he statedhe feltmuch better since being discharged from the hospital.. His weighthad beenconsistent around 293-294 pounds at home and hewas293.4 at that visit. Hehadbeen increasing his activity slowly and doing chores around his house.However, hestatedhe waslimited due to the oxygen he wascarrying around. He continuedto use 3 L of oxygen with activity but states he has been able to titrate the oxygen down to 2.5 L when he is inactive.His losartan was decreased to 25 mg daily and his carvedilol was increased to 6.25 mg twice daily. A sleep evaluation was also ordered.  He presentedto the clinic 11/06/20and statedhewasfeeling better. He continuedto walk daily and hadincreased to 15 to 20 minutes once daily. He continuedto monitor his weight daily,298 lbs.He statedhe hadbeen having more convenient foods . Educated about packaged and fast food options. Hewasusing only 2 L of oxygen routinely and statedthat he wascontinuing to use his oxygen at night while sleeping. He didnot notice any increased heart rate or palpitations. I will increasedhis losartan,repeatedBMP, and again in 2 weeks. Planned to repeat echo in 2 months.  Repeat echocardiogram January 2021 showed substantial improvement with an LVEF of 40-45%, impaired relaxation, normal left atrial filling pressures, and an estimated systolic PA pressure of 36 mmHg.  His last seen by Dr. Sallyanne Kuster on 10/17/2019. During that time he stated he was feeling much improved. However, he did complain of increased urination. He denied lightheadedness, syncope, palpitations orthopnea, PND, dizziness, and chest pain. On  exam he had no lower extremity  swelling. His weight at that time was 311 pounds, when he presented in September his weight was 336 pounds and at discharge from the hospital he weighed 298 pounds. He had undergone his CPAP titration study but had not yet received his CPAP equipment.  He presented to the clinic 11/09/2019 and statedhe ran out of his [name] about a week and a half prior. He had also noticed some increased work of breathing over the past week and a half. His weight was up around 18 pounds. He stated that through the day he did not notice any increased work of breathing however, at night when he laid back he felt shortness of breath. He stated that he had not been weighing daily and had been eating more salty foods. He  continued to walk daily for about 30 minutes. His Entresto had been approved. He continued to use 2 L of oxygen through the day and at night. He had not received his CPAP supplies. I  sent a message to Dr. Tresa Endo.   He was given the salty 6 sheet, is torsemide was increased to 60 mg x 3 days, and is also given potassium 20 mEq x 3 days.  He is seen virtually 11/15/2019 and stated he was losing a pound per day while he was on his increased dose of torsemide.  However, he weighed 318 pounds which was 2 pounds above where he was the prior  week.  When asked about his fluid consumption he stated he was consuming around 125 ounces daily.  He felt his home scale was accurate and had been in line with the office scale.  He was trying to eat a lower sodium diet although he admited to it being somewhat difficult.  I  increased his torsemide to 60 mg twice daily for 1 week and added metolazone 2.5 2 times per week to his medication regimen.  He continued on 2 L of oxygen.  He presents the clinic today for follow-up and states his breathing has returned to its baseline.  He states that with the addition of metolazone he has had much more urine output.  He also states that he feels the fluid restriction has  made a difference.  He has been chewing sugar-free gum to help with his mouth dryness.  He continues to follow a low-sodium diet and has increased his physical activity.  His weight today has decreased back to 308 from 318 on 11/15/2019.  I will decrease his torsemide to 40 mg twice daily, continue his metolazone 2.5 2 times per week and continue fluid restriction.  Today hedenies chest pain, increasedshortness of breath,increasedlower extremity edema, fatigue, palpitations, melena, hematuria, hemoptysis, diaphoresis, weakness, presyncope, syncope,and PND.  Home Medications    Prior to Admission medications   Medication Sig Start Date End Date Taking? Authorizing Provider  aspirin 81 MG chewable tablet Chew 1 tablet (81 mg total) by mouth daily. 06/08/19   Berton Mount I, MD  carvedilol (COREG) 6.25 MG tablet TAKE 1 TABLET BY MOUTH TWICE DAILY WITH A MEAL 10/29/19   Ronney Asters, NP  cetirizine (ZYRTEC) 10 MG tablet Take 10 mg by mouth daily as needed for allergies or rhinitis.    [provider]  fluticasone (FLONASE) 50 MCG/ACT nasal spray Place 2 sprays into both nostrils daily. 11/19/19   Steffanie Dunn, DO  metolazone (ZAROXOLYN) 2.5 MG tablet Take 1 tablet (2.5 mg total) by mouth daily as needed (WEIGHT GAIN). TAKE  Friday, Monday AND Wednesday 11/20/19 02/18/20  Ronney Asters, NP  mometasone-formoterol (DULERA) 100-5 MCG/ACT AERO Inhale 2 puffs into the lungs 2 (two) times daily. 06/07/19   Berton Mount I, MD  potassium chloride 20 MEQ TBCR Take 20 mEq by mouth daily. X3D THEN BACK TO 11/15/19   Ronney Asters, NP  sacubitril-valsartan (ENTRESTO) 49-51 MG Take 1 tablet by mouth 2 (two) times daily. 11/09/19   Ronney Asters, NP  spironolactone (ALDACTONE) 25 MG tablet Take 1 tablet (25 mg total) by mouth daily. 08/27/19   Croitoru, Mihai, MD  tiotropium (SPIRIVA HANDIHALER) 18 MCG inhalation capsule Place 1 capsule (18 mcg total) into inhaler and inhale  daily. 06/07/19 06/06/20  Barnetta Chapel, MD  tiotropium (SPIRIVA) 18 MCG inhalation capsule Place 1 capsule (18 mcg total) into inhaler and inhale daily. 11/19/19   Steffanie Dunn, DO  torsemide (DEMADEX) 20 MG tablet Take 3 tablets (60 mg total) by mouth 2 (two) times daily. 11/15/19   Ronney Asters, NP    Family History    Family History  Problem Relation Age of Onset  . Kidney disease Mother   . Diabetes Father   . Heart attack Father   . Diabetes Sister   . Heart disease Sister    He indicated that his mother is deceased. He indicated that his father is deceased. He indicated that his sister is alive.  Social History    Social History   Socioeconomic History  . Marital status: Married    Spouse name: Not on file  . Number of children: Not on file  . Years of education: Not on file  . Highest education level: Not on file  Occupational History  . Not on file  Tobacco Use  . Smoking status: Former Smoker    Packs/day: 1.00    Years: 15.00    Pack years: 15.00    Types: Cigarettes    Quit date: 05/31/1989    Years since quitting: 30.4  . Smokeless tobacco: Never Used  Substance and Sexual Activity  . Alcohol use: Not Currently  . Drug use: Never  . Sexual activity: Not on file  Other Topics Concern  . Not on file  Social History Narrative  . Not on file   Social Determinants of Health   Financial Resource Strain:   . Difficulty of Paying Living Expenses:   Food Insecurity:   . Worried About Programme researcher, broadcasting/film/video in the Last Year:   . Barista in the Last Year:   Transportation Needs:   . Freight forwarder (Medical):   Marland Kitchen Lack of Transportation (Non-Medical):   Physical Activity: Unknown  . Days of Exercise per Week: Patient refused  . Minutes of Exercise per Session: Patient refused  Stress: No Stress Concern Present  . Feeling of Stress : Only a little  Social Connections: Unknown  . Frequency of Communication with Friends and Family: Not  on file  . Frequency of Social Gatherings with Friends and Family: Not on file  . Attends Religious Services: Not on file  . Active Member of Clubs or Organizations: Not on file  . Attends Banker Meetings: Not on file  . Marital Status: Married  Catering manager Violence: Unknown  . Fear of Current or Ex-Partner: Patient refused  . Emotionally Abused: Patient refused  . Physically Abused: Patient refused  . Sexually Abused: Patient refused     Review of Systems  General:  No chills, fever, night sweats or weight changes.  Cardiovascular:  No chest pain, dyspnea on exertion, edema, orthopnea, palpitations, paroxysmal nocturnal dyspnea. Dermatological: No rash, lesions/masses Respiratory: No cough, dyspnea Urologic: No hematuria, dysuria Abdominal:   No nausea, vomiting, diarrhea, bright red blood per rectum, melena, or hematemesis Neurologic:  No visual changes, wkns, changes in mental status. All other systems reviewed and are otherwise negative except as noted above.  Physical Exam    VS:  BP 98/62 (BP Location: Left Arm, Patient Position: Sitting, Cuff Size: Large)   Pulse 82   Temp (!) 97.5 F (36.4 C)   Ht 5\' 9"  (1.753 m)   Wt (!) 308 lb (139.7 kg)   BMI 45.48 kg/m  , BMI Body mass index is 45.48 kg/m. GEN: Well nourished, well developed, in no acute distress. HEENT: normal. Neck: Supple, no JVD, carotid bruits, or masses. Cardiac: RRR, no murmurs, rubs, or gallops. No clubbing, cyanosis, edema.  Radials/DP/PT 2+ and equal bilaterally.  Respiratory:  Respirations regular and unlabored, clear to auscultation bilaterally. GI: Soft, nontender, nondistended, BS + x 4. MS: no deformity or atrophy. Skin: warm and dry, no rash. Neuro:  Strength and sensation are intact. Psych: Normal affect.  Accessory Clinical Findings    ECG personally reviewed by me today-none today.  Cardiac catheterization 06/04/2017  Mid LAD lesion is 10% stenosed.  Prox RCA  lesion is 10% stenosed.  LV end diastolic pressure is moderately elevated.  There is no aortic valve stenosis.  Hemodynamic findings consistent with moderate pulmonary hypertension.  Ao sat 97%, PA sat 65%, mean PA pressure 44 mm Hg; mean PCWP 29 mm Hg; CO 5.1 L/min; CI 2.08  Nonischemic cardiomyopathy.  Moderate pulmonary HTN.  Echocardiogram 09/12/2019 IMPRESSIONS    1. Left ventricular ejection fraction, by visual estimation, is 40 to  45%. The left ventricle has mildly decreased function. There is no left  ventricular hypertrophy.  2. Left ventricular diastolic parameters are consistent with Grade I  diastolic dysfunction (impaired relaxation).  3. Septal bounce present.  4. The left ventricle demonstrates global hypokinesis.  5. Global right ventricle has normal systolic function.The right  ventricular size is normal. No increase in right ventricular wall  thickness.  6. Left atrial size was normal.  7. Right atrial size was normal.  8. Presence of pericardial fat pad.  9. The mitral valve is grossly normal. Trivial mitral valve  regurgitation.  10. The tricuspid valve is grossly normal.  11. The aortic valve is tricuspid. Aortic valve regurgitation is not  visualized. No evidence of aortic valve sclerosis or stenosis.  12. The pulmonic valve was grossly normal. Pulmonic valve regurgitation is  not visualized.  13. Mildly elevated pulmonary artery systolic pressure.  14. The tricuspid regurgitant velocity is 2.87 m/s, and with an assumed  right atrial pressure of 3 mmHg, the estimated right ventricular systolic  pressure is mildly elevated at 35.9 mmHg.  15. Changes from prior study are noted.  16. EF has improved to 40-45%.   Echocardiogram 06/02/2019 IMPRESSIONS   1. Left ventricular ejection fraction, by visual estimation, is 30 to 35%. The left ventricle has severely decreased function. Left ventricular septal wall thickness was moderately  increased. There is no left ventricular hypertrophy. 2. Entire lateral wall, entire anterior wall, and apex are abnormal. 3. Elevated mean left atrial pressure. 4. Left ventricular diastolic Doppler parameters are consistent with pseudonormalization pattern of LV diastolic filling. 5. Global right ventricle has mildly reduced systolic function.The  right ventricular size is severely enlarged. No increase in right ventricular wall thickness. 6. Left atrial size was moderately dilated. 7. Right atrial size was moderately dilated. 8. Mild mitral annular calcification. 9. The mitral valve is normal in structure. Trace mitral valve regurgitation. 10. The tricuspid valve is normal in structure. Tricuspid valve regurgitation is trivial. 11. The aortic valve is tricuspid Aortic valve regurgitation was not visualized by color flow Doppler. Mild aortic valve sclerosis without stenosis. 12. The pulmonic valve was not well visualized. Pulmonic valve regurgitation is not visualized by color flow Doppler. 13. Moderately elevated pulmonary artery systolic pressure. 14. The inferior vena cava is dilated in size with <50% respiratory variability, suggesting right atrial pressure of 15 mmHg.  Cardiac MRI 06/05/2019 IMPRESSION: 1. Mildly dilated LV with mild LV hypertrophy. EF calculated 16% but looks in the 25% range visually (difficult study due to motion artifact). Septal bounce with diffuse hypokinesis, suspect RV pressure/volume overload.  2. Moderately dilated RV with severely decreased systolic function, EF 25% (visually more moderate dysfunction).   3. No myocardial LGE so no definitive evidence for prior MI, myocarditis, or infiltrative disease.  Assessment & Plan   1.  Acute on chronic systolic congestive heart failure-no increased work of breathing,continues using 2Lof oxygen.Weight today is 308 pounds down from 318 pounds, 316 pounds 11/09/2019,upfrom 302.8 pounds on  06/05/2019.He has continues to be physical activitywalking58minutes daily.Echocardiogram 09/2019 showed improvement in his EF to 40-45%.   Continue torsemide to 40 mg twice daily-may take an extra dose of torsemide for weight increase of 3 pounds overnight or 5 pounds in 1 week.  Continue metolazone 2.5 mg 2 times per week Continue spironolactone 25 mg tablet daily ContinueEntresto 49-51 twice daily Contniuecarvedilol to 6.25 mg twice daily ContinueDaily weights Heart healthy low-sodium diet-salty 6 given Start fluid restriction 64 ounces daily Order BMP  Acute on chronic cor pulmonale/HF-Underwent CPAP titration. Continues 2 L of oxygen at night has not received CPAP supplies. Believed to bedue to OSA and obesity-hypoventilation syndrome. Continue weight loss Heart healthy low-sodium diet  Paroxysmal atrial tachycardia-heart rate today 82.Has not noticed any recent episodes of palpitations. Continuecarvedilol to 6.25 mg twice daily  Obstructive sleep apnea-still has not yet received CPAP supplies. Will contact Claiborne Rigg. Followed by Dr. Tresa Endo  Morbid obesity-weight today 308pounds Heart healthy low-sodium diet-salty 6 given Continue weight loss Increase physical activity as tolerated  Disposition: Follow-up with me in 1 month.   Thomasene Ripple. Raynard Mapps NP-C       [facility] [address] Office [phone] Fax [phone]

## 2019-11-22 ENCOUNTER — Ambulatory Visit (INDEPENDENT_AMBULATORY_CARE_PROVIDER_SITE_OTHER): Payer: Medicaid Other | Admitting: General Practice

## 2019-11-22 ENCOUNTER — Other Ambulatory Visit: Payer: Self-pay

## 2019-11-22 ENCOUNTER — Encounter: Payer: Self-pay | Admitting: General Practice

## 2019-11-22 VITALS — BP 98/62 | HR 82 | Temp 97.5°F | Ht 69.0 in | Wt 308.0 lb

## 2019-11-22 DIAGNOSIS — Z79899 Other long term (current) drug therapy: Secondary | ICD-10-CM

## 2019-11-22 DIAGNOSIS — I5021 Acute systolic (congestive) heart failure: Secondary | ICD-10-CM

## 2019-11-22 DIAGNOSIS — I2781 Cor pulmonale (chronic): Secondary | ICD-10-CM

## 2019-11-22 DIAGNOSIS — G4733 Obstructive sleep apnea (adult) (pediatric): Secondary | ICD-10-CM

## 2019-11-22 DIAGNOSIS — I471 Supraventricular tachycardia: Secondary | ICD-10-CM

## 2019-11-22 MED ORDER — METOLAZONE 2.5 MG PO TABS: 2.5000 mg | ORAL_TABLET | ORAL | 3 refills | Status: DC

## 2019-11-22 MED ORDER — TORSEMIDE 20 MG PO TABS: 40.0000 mg | ORAL_TABLET | Freq: Two times a day (BID) | ORAL | 3 refills | Status: DC

## 2019-11-22 NOTE — Progress Notes (Signed)
Thanks

## 2019-11-22 NOTE — Patient Instructions (Addendum)
Medication Instructions:  TAKE TORSEMIDE 40MG  TWICE DAILY; MAY TAKE EXTRA DOSE IF YOUR WEIGHT INCREASE 3#/DAY OR 5#/WEEK  TAKE METOLAZONE 2.5MG  2 TIMES A WEEK, Monday AND THURSDAY If you need a refill on your cardiac medications before your next appointment, please call your pharmacy.  Labwork: BMET TODAY HERE IN OUR OFFICE AT [facility]    If you have labs (blood work) drawn today and your tests are completely normal, you will receive your results only by: [facility] MyChart Message (if you have MyChart) OR A paper copy in the mail If you have any lab test that is abnormal or we need to change your treatment, we will call you to review these results. You may go to any [facility] lab that is convenient for you however, we do have a lab in our office that is able to assist you. You do NOT need an appointment for our lab. Once in our office in our office lobby there is a podium where you sign-in and ring the doorbell to alert Marland Kitchen that you are here. Lab is open 8:00am and closes at 4:00pm; closes for lunch from 12:45 - 1:45pm. PLEASE BRING A COPY OF YOUR INSURANCE CARD WITH YOU.  Special Instructions: TAKE AND LOG YOUR WEIGHT DAILY  TAKE AND LOG YOUR BLOOD PRESSURE 2 TIMES A WEEK  PLEASE LIMIT YOUR FLUIDS TO 64OZ DAILY  PLEASE READ AND FOLLOW SALTY 6 ATTACHED  Follow-Up: 1 MONTH  Virtual Visit  JESSE CLEAVER, FNP-C.    At [facility], you and your health needs are our priority.  As part of our continuing mission to provide you with exceptional heart care, we have created designated Provider Care Teams.  These Care Teams include your primary Cardiologist (physician) and Advanced Practice Providers (APPs -  Physician Assistants and Nurse Practitioners) who all work together to provide you with the care you need, when you need it.  Reduce your risk of getting COVID-19 With your heart disease it is especially important for people at increased risk of severe illness from COVID-19, and those who live with  them, to protect themselves from getting COVID-19. The best way to protect yourself and to help reduce the spread of the virus that causes COVID-19 is to: [facility] Limit your interactions with other people as much as possible. . Take COVID-19 when you do interact with others. If you start feeling sick and think you may have COVID-19, get in touch with your healthcare provider within 24 hours.  Thank you for choosing [facility] at [facility]!!

## 2019-11-23 LAB — BASIC METABOLIC PANEL
BUN/Creatinine Ratio: 35 — ABNORMAL HIGH (ref 10–24)
BUN: 45 mg/dL — ABNORMAL HIGH (ref 8–27)
CO2: 31 mmol/L — ABNORMAL HIGH (ref 20–29)
Calcium: 9.7 mg/dL (ref 8.6–10.2)
Chloride: 93 mmol/L — ABNORMAL LOW (ref 96–106)
Creatinine, Ser: 1.29 mg/dL — ABNORMAL HIGH (ref 0.76–1.27)
GFR calc Af Amer: 69 mL/min/{1.73_m2} (ref >59)
GFR calc non Af Amer: 59 mL/min/{1.73_m2} — ABNORMAL LOW (ref >59)
Glucose: 106 mg/dL — ABNORMAL HIGH (ref 65–99)
Potassium: 4.2 mmol/L (ref 3.5–5.2)
Sodium: 141 mmol/L (ref 134–144)

## 2019-12-18 NOTE — Progress Notes (Signed)
Virtual Visit via Telephone Note   This visit type was conducted due to national recommendations for restrictions regarding the COVID-19 Pandemic (e.g. social distancing) in an effort to limit this patient's exposure and mitigate transmission in our community.  Due to his co-morbid illnesses, this patient is at least at moderate risk for complications without adequate follow up.  This format is felt to be most appropriate for this patient at this time.  The patient did not have access to video technology/had technical difficulties with video requiring transitioning to audio format only (telephone).  All issues noted in this document were discussed and addressed.  No physical exam could be performed with this format.  Please refer to the patient's chart for his  consent to telehealth for [facility].  Evaluation Performed:  Follow-up visit  This visit type was conducted due to national recommendations for restrictions regarding the COVID-19 Pandemic (e.g. social distancing).  This format is felt to be most appropriate for this patient at this time.  All issues noted in this document were discussed and addressed.  No physical exam was performed (except for noted visual exam findings with Video Visits).  Please refer to the patient's chart (MyChart message for video visits and phone note for telephone visits) for the patient's consent to telehealth for [facility]  Date:  12/18/2019   ID:  Justin Rasmussen, DOB Dec 07, 1955, MRN [id number]  Patient Location:  [address] [place] [address]   Provider location:    [facility] [address] Office [phone] Fax [phone]   PCP:  [name], PA  Cardiologist:  [name], MD  Electrophysiologist:  None   Chief Complaint: Follow-up  History of Present Illness:    Justin Rasmussen is a 64 y.o. male who presents via audio/video conferencing for a telehealth visit today.  Patient  verified DOB and address.  The patient does not symptoms concerning for COVID-19 infection (fever, chills, cough, or new SHORTNESS OF BREATH).   Justin Cogginis a 63 y.o.malewith a hx ofacute systolic congestive heart failure, cardiomyopathy of unknown etiology, RBBB, cor pulmonale chronic, elevated hemidiaphragm, and morbid obesity.  He was admitted [facility] on 06/01/2019 with increased dyspnea on exertion over the prior 2 to 3 weeks. He underwent cardiac catheterization on 06/05/2019 which showed nonischemic cardiomyopathy and moderate pulmonary hypertension. A cardiac MRI on 06/06/2019 showed mildly dilated left ventricle with mild left ventricular hypertrophy and an EF calculated at 16% however, by visual estimate looks closer to 25%, diffuse hypokinesis moderately dilated right ventricle with severely decreased systolic function EF 25% and no myocardial LGE (no definitive evidence of prior MI, myocarditis, or infiltrative disease). His moderate pulmonary hypertension is thought to be due to his left heart failure and cor pulmonale, obesity hypoventilation syndrome/OSA and his morbid obesity. During that time he was also having episodes of PAT which were thought to be contributing to his heart failure as well.   He was seen by me on 06/19/2019 he statedhe feltmuch better since being discharged from the hospital.. His weighthad beenconsistent around 293-294 pounds at home and hewas293.4 at that visit. Hehadbeen increasing his activity slowly and doing chores around his house.However, hestatedhe waslimited due to the oxygen he wascarrying around. He continuedto use 3 L of oxygen with activity but states he has been able to titrate the oxygen down to 2.5 L when he is inactive.His losartan was decreased to 25 mg daily and his carvedilol was increased to  6.25 mg twice daily. A sleep evaluation was also ordered.  He presentedto the clinic 11/06/20and statedhewasfeeling  better. He continuedto walk daily and hadincreased to 15 to 20 minutes once daily. He continuedto monitor his weight daily,298 lbs.He statedhe hadbeen having more convenient foods . Educated about packaged and fast food options. Hewasusing only 2 L of oxygen routinely and statedthat he wascontinuing to use his oxygen at night while sleeping. He didnot notice any increased heart rate or palpitations. I will increasedhis losartan,repeatedBMP, and again in 2 weeks. Planned to repeat echo in 2 months.  Repeat echocardiogram January 2021 showed substantial improvement with an LVEF of 40-45%, impaired relaxation, normal left atrial filling pressures, and an estimated systolic PA pressure of 36 mmHg.  He presentedto the clinic 3/5/2021and statedhe ran out of his [name] about a week and a halfprior. He hadalso noticed some increased work of breathing over the past week and a half. His weightwasup around 18 pounds. He statedthat through the day he didnot notice any increasedwork of breathing however, at night when he laidback he feltshortness of breath. He statedthat he hadnot been weighing daily and hadbeen eating more salty foods. He continuedto walk daily for about 30 minutes. His Entresto hadbeen approved. He continuedto use 2 L of oxygen through the day and at night. He hadnot received his CPAP supplies. I senta message to Dr. Tresa Endo. He was given the salty 6 sheet, is torsemide was increased to 60 mg x 3 days, and is also given potassium 20 mEq x 3 days.  He was seen virtually 11/15/2019 and statedhe was losing a pound per day while he was on his increased dose of torsemide. However, he weighed 318 pounds which was 2 pounds above where he was the prior  week. When asked about his fluid consumption he stated he was consuming around 125 ounces daily. He felt his home scale was accurate and had been in line with the office scale. He was trying to  eat a lower sodium diet although he admited to it being somewhat difficult. I  increased his torsemide to 60 mg twice daily for 1 week and added metolazone 2.5 2 times per week to his medication regimen. He continued on 2 L of oxygen.  He presented the clinic 11/22/19 for follow-up and states his breathing has returned to its baseline.  He stated that with the addition of metolazone he  had much more urine output.  He also stated that he felt the fluid restriction had made a difference.  He had been chewing sugar-free gum to help with his mouth dryness.  He continued to follow a low-sodium diet and has increased his physical activity.  His weight was decreased back to 308 from 318 on 11/15/2019.  I  decreased his torsemide to 40 mg twice daily, continued his metolazone 2.5 two times per week and continue fluid restriction.  He is seen virtually today for follow-up and states he feels well.  He has noticed nausea with taking metolazone.  He indicated that each time he takes his metolazone he has nauseousness.  The days he does not take it he does not notice the symptoms.  His weight has been stable.  He has been trying to adhere to the fluid restriction and low-sodium diet.  He continues to be on 2 L of oxygen.  He still has not received his CPAP supplies from Dr. Tresa Endo.  I have encouraged him to maintain his diet, daily weights, and take an extra dose of  torsemide for weight increase of 3 pounds overnight or 5 pounds in 1 week.  I will stop his Zaroxolyn.  I will have him follow-up with Dr. Salena Saner in 3 months.  Today hedenies chest pain, increasedshortness of breath,increasedlower extremity edema, fatigue, palpitations, melena, hematuria, hemoptysis, diaphoresis, weakness, presyncope, syncope,and PND.  Prior CV studies:   The following studies were reviewed today:  Cardiac catheterization 06/04/2017  Mid LAD lesion is 10% stenosed.  Prox RCA lesion is 10% stenosed.  LV end diastolic pressure is  moderately elevated.  There is no aortic valve stenosis.  Hemodynamic findings consistent with moderate pulmonary hypertension.  Ao sat 97%, PA sat 65%, mean PA pressure 44 mm Hg; mean PCWP 29 mm Hg; CO 5.1 L/min; CI 2.08  Nonischemic cardiomyopathy.  Moderate pulmonary HTN.  Echocardiogram 09/12/2019 IMPRESSIONS    1. Left ventricular ejection fraction, by visual estimation, is 40 to  45%. The left ventricle has mildly decreased function. There is no left  ventricular hypertrophy.  2. Left ventricular diastolic parameters are consistent with Grade I  diastolic dysfunction (impaired relaxation).  3. Septal bounce present.  4. The left ventricle demonstrates global hypokinesis.  5. Global right ventricle has normal systolic function.The right  ventricular size is normal. No increase in right ventricular wall  thickness.  6. Left atrial size was normal.  7. Right atrial size was normal.  8. Presence of pericardial fat pad.  9. The mitral valve is grossly normal. Trivial mitral valve  regurgitation.  10. The tricuspid valve is grossly normal.  11. The aortic valve is tricuspid. Aortic valve regurgitation is not  visualized. No evidence of aortic valve sclerosis or stenosis.  12. The pulmonic valve was grossly normal. Pulmonic valve regurgitation is  not visualized.  13. Mildly elevated pulmonary artery systolic pressure.  14. The tricuspid regurgitant velocity is 2.87 m/s, and with an assumed  right atrial pressure of 3 mmHg, the estimated right ventricular systolic  pressure is mildly elevated at 35.9 mmHg.  15. Changes from prior study are noted.  16. EF has improved to 40-45%.   Echocardiogram 06/02/2019 IMPRESSIONS   1. Left ventricular ejection fraction, by visual estimation, is 30 to 35%. The left ventricle has severely decreased function. Left ventricular septal wall thickness was moderately increased. There is no left ventricular hypertrophy. 2.  Entire lateral wall, entire anterior wall, and apex are abnormal. 3. Elevated mean left atrial pressure. 4. Left ventricular diastolic Doppler parameters are consistent with pseudonormalization pattern of LV diastolic filling. 5. Global right ventricle has mildly reduced systolic function.The right ventricular size is severely enlarged. No increase in right ventricular wall thickness. 6. Left atrial size was moderately dilated. 7. Right atrial size was moderately dilated. 8. Mild mitral annular calcification. 9. The mitral valve is normal in structure. Trace mitral valve regurgitation. 10. The tricuspid valve is normal in structure. Tricuspid valve regurgitation is trivial. 11. The aortic valve is tricuspid Aortic valve regurgitation was not visualized by color flow Doppler. Mild aortic valve sclerosis without stenosis. 12. The pulmonic valve was not well visualized. Pulmonic valve regurgitation is not visualized by color flow Doppler. 13. Moderately elevated pulmonary artery systolic pressure. 14. The inferior vena cava is dilated in size with <50% respiratory variability, suggesting right atrial pressure of 15 mmHg.  Cardiac MRI 06/05/2019 IMPRESSION: 1. Mildly dilated LV with mild LV hypertrophy. EF calculated 16% but looks in the 25% range visually (difficult study due to motion artifact). Septal bounce with diffuse hypokinesis, suspect RV pressure/volume  overload.  2. Moderately dilated RV with severely decreased systolic function, EF 25% (visually more moderate dysfunction).  3. No myocardial LGE so no definitive evidence for prior MI, myocarditis, or infiltrative disease.  Past Medical History:  Diagnosis Date  . 2019 novel coronavirus disease (COVID-19) 07/2019  . COPD (chronic obstructive pulmonary disease) (HCC)   . HFrEF (heart failure with reduced ejection fraction) (HCC)   . Nonischemic cardiomyopathy (HCC)   . OSA (obstructive sleep apnea)   . Pulmonary  hypertension (HCC)    WHO group II   Past Surgical History:  Procedure Laterality Date  . ADENOIDECTOMY    . RIGHT/LEFT HEART CATH AND CORONARY ANGIOGRAPHY N/A 06/05/2019   Procedure: RIGHT/LEFT HEART CATH AND CORONARY ANGIOGRAPHY;  Surgeon: Corky Crafts, MD;  Location: [facility] INVASIVE CV LAB;  Service: Cardiovascular;  Laterality: N/A;     No outpatient medications have been marked as taking for the 12/19/19 encounter (Appointment) with Ronney Asters, NP.     Allergies:   Patient has no known allergies.   Social History   Tobacco Use  . Smoking status: Former Smoker    Packs/day: 1.00    Years: 15.00    Pack years: 15.00    Types: Cigarettes    Quit date: 05/31/1989    Years since quitting: 30.5  . Smokeless tobacco: Never Used  Substance Use Topics  . Alcohol use: Not Currently  . Drug use: Never     Family Hx: The patient's family history includes Diabetes in his father and sister; Heart attack in his father; Heart disease in his sister; Kidney disease in his mother.  ROS:   Please see the history of present illness.     All other systems reviewed and are negative.   Labs/Other Tests and Data Reviewed:    Recent Labs: 06/01/2019: B Natriuretic Peptide 322.5 06/03/2019: ALT 25 06/04/2019: TSH 1.966 06/05/2019: Hemoglobin 16.3; Platelets 168 06/07/2019: Magnesium 2.0 11/22/2019: BUN 45; Creatinine, Ser 1.29; Potassium 4.2; Sodium 141   Recent Lipid Panel Lab Results  Component Value Date/Time   CHOL 154 06/06/2019 04:57 AM   TRIG 76 06/06/2019 04:57 AM   HDL 34 (L) 06/06/2019 04:57 AM   CHOLHDL 4.5 06/06/2019 04:57 AM   LDLCALC 105 (H) 06/06/2019 04:57 AM    Wt Readings from Last 3 Encounters:  11/22/19 (!) 308 lb (139.7 kg)  11/19/19 (!) 314 lb (142.4 kg)  11/15/19 (!) 318 lb (144.2 kg)     Exam:    Vital Signs:  There were no vitals taken for this visit.   Well nourished, well developed male in no  acute distress.   ASSESSMENT & PLAN:    1.   Acute on chronic systolic congestive heart failure-no increased work of breathing today.Continues using 2Lof oxygen.Weight today is 308  pounds down from 318pounds, 316 pounds 11/09/2019,upfrom 302.8 pounds on 06/05/2019.He hascontinues to bephysical activitywalking64minutes daily.Echocardiogram 09/2019 showed improvement in his EF to 40-45%.  Continue torsemide to 40 mg twice daily-may take an extra dose of torsemide for weight increase of 3 pounds overnight or 5 pounds in 1 week. Stop metolazone 2.5 mg 2 times per week-due to nauseousness Continue spironolactone 25 mg tablet daily ContinueEntresto 49-51 twice daily Contniuecarvedilol to 6.25 mg twice daily ContinueDaily weights Heart healthy low-sodium diet-salty 6 given Continue fluid restriction 64 ounces daily   Acute on chronic cor pulmonale/HF-Underwent CPAP titration. Continues 2 L of oxygen at night has not received CPAP supplies. Believed to bedue to OSA and obesity-hypoventilation  syndrome. Continue weight loss Heart healthy low-sodium diet  Paroxysmal atrial tachycardia-heart rate today 82.Has not noticed any recent episodes of palpitations. Continuecarvedilol to 6.25 mg twice daily  Obstructive sleep apnea-continues to wait for CPAP supplies. Will again reach out to [facility]/Dr. Tresa Endo. Followed by Dr. Tresa Endo  Morbid obesity-weight today 308pounds Heart healthy low-sodium diet-salty 6 given Continue weight loss Increase physical activity as tolerated  Disposition: Follow-up with [name] or me in 3 months.  COVID-19 Education: The signs and symptoms of COVID-19 were discussed with the patient and how to seek care for testing (follow up with PCP or arrange E-visit).  The importance of social distancing was discussed today.  Patient Risk:   After full review of this patients clinical status, I feel that they are at least moderate risk at this time.  Time:   Today, I have  spent 15 minutes with the patient with telehealth technology discussing medication, diet, daily weights, fluid restriction, and when to call the office..     Medication Adjustments/Labs and Tests Ordered: Current medicines are reviewed at length with the patient today.  Concerns regarding medicines are outlined above.   Tests Ordered: No orders of the defined types were placed in this encounter.  Medication Changes: No orders of the defined types were placed in this encounter.   Disposition: Follow-up with Dr. Salena Saner in 3 months.  Thomasene Ripple. Zymire Turnbo NP-C        [facility] [address] Office [phone] Fax [phone]

## 2019-12-19 ENCOUNTER — Encounter: Payer: Self-pay | Admitting: General Practice

## 2019-12-19 ENCOUNTER — Telehealth (INDEPENDENT_AMBULATORY_CARE_PROVIDER_SITE_OTHER): Payer: Medicaid Other | Admitting: General Practice

## 2019-12-19 VITALS — BP 105/66 | HR 90 | Ht 68.0 in | Wt 308.0 lb

## 2019-12-19 DIAGNOSIS — I2781 Cor pulmonale (chronic): Secondary | ICD-10-CM | POA: Diagnosis not present

## 2019-12-19 DIAGNOSIS — G4733 Obstructive sleep apnea (adult) (pediatric): Secondary | ICD-10-CM

## 2019-12-19 DIAGNOSIS — I5021 Acute systolic (congestive) heart failure: Secondary | ICD-10-CM

## 2019-12-19 DIAGNOSIS — I471 Supraventricular tachycardia: Secondary | ICD-10-CM

## 2019-12-19 NOTE — Progress Notes (Signed)
Thanks, agree with stopping the metolazone. Would like to get to max Entresto if we can get his BP checked and it's not too low.

## 2019-12-19 NOTE — Patient Instructions (Signed)
Medication Instructions:  STOP METOLAZONE.  CONTINUE TORSEMIDE 40 mg 2 (two) times daily. YOU MAY TAKE EXTRA IF YOUR WEIGHT IS >3# (311#)DAILY OR >5#(313#) WEEKLY PLEASE CALL IF YOU TAKE EXTRA.   *If you need a refill on your cardiac medications before your next appointment, please call your pharmacy*  Special Instructions CONTINUE FLUID RESTRICTION 64OZ DAILY.  TAKE AND LOG YOUR WEIGHT DAILY AND BRING WITH YOU TO FOLLOW UP APPOINTMENT.  PLEASE READ AND FOLLOW SALTY 6-ATTACHED.  Follow-Up: Your next appointment:  3 month(s) Please call our office 2 months in advance to schedule this appointment In Person with [name], MD.  At [facility], you and your health needs are our priority.  As part of our continuing mission to provide you with exceptional heart care, we have created designated Provider Care Teams.  These Care Teams include your primary Cardiologist (physician) and Advanced Practice Providers (APPs -  Physician Assistants and Nurse Practitioners) who all work together to provide you with the care you need, when you need it.

## 2020-01-10 ENCOUNTER — Telehealth: Payer: Self-pay | Admitting: Cardiovascular Disease

## 2020-01-10 NOTE — Telephone Encounter (Signed)
New message   Per the below note the patient's wife states that he can come in for an appointment on 01/24/20 at 8:30am when the time is put into the system. Please call the patient to setup this appt.  From: June, Sarah O, RN  Sent: 01/09/2020  3:39 PM EDT  To: Cv Div Nl Scheduling   Please schedule pt for ov with Dr.Kelly in 2-3 months (must be prior to 04/01/20 for sleep compliance) thank you!

## 2020-01-11 NOTE — Telephone Encounter (Signed)
Called and spoke with pts wife. Able to schedule pt for appt with Dr.Kelly on 01/24/20 at 8:20am. Wife verbalized understanding with no other questions at this time.

## 2020-01-24 ENCOUNTER — Other Ambulatory Visit: Payer: Self-pay

## 2020-01-24 ENCOUNTER — Ambulatory Visit (INDEPENDENT_AMBULATORY_CARE_PROVIDER_SITE_OTHER): Payer: Medicaid Other | Admitting: Cardiovascular Disease

## 2020-01-24 ENCOUNTER — Encounter: Payer: Self-pay | Admitting: Cardiovascular Disease

## 2020-01-24 VITALS — BP 84/50 | HR 72 | Ht 69.0 in | Wt 313.2 lb

## 2020-01-24 DIAGNOSIS — I2781 Cor pulmonale (chronic): Secondary | ICD-10-CM | POA: Diagnosis not present

## 2020-01-24 DIAGNOSIS — G4733 Obstructive sleep apnea (adult) (pediatric): Secondary | ICD-10-CM | POA: Diagnosis not present

## 2020-01-24 DIAGNOSIS — I5021 Acute systolic (congestive) heart failure: Secondary | ICD-10-CM

## 2020-01-24 DIAGNOSIS — IMO0002 Reserved for concepts with insufficient information to code with codable children: Secondary | ICD-10-CM

## 2020-01-24 DIAGNOSIS — I952 Hypotension due to drugs: Secondary | ICD-10-CM | POA: Diagnosis not present

## 2020-01-24 DIAGNOSIS — I2721 Secondary pulmonary arterial hypertension: Secondary | ICD-10-CM

## 2020-01-24 DIAGNOSIS — G4736 Sleep related hypoventilation in conditions classified elsewhere: Secondary | ICD-10-CM

## 2020-01-24 MED ORDER — TORSEMIDE 20 MG PO TABS: 20.0000 mg | ORAL_TABLET | Freq: Two times a day (BID) | ORAL | 3 refills | Status: DC

## 2020-01-24 NOTE — Patient Instructions (Signed)
Medication Instructions:  DECREASE TORSEMIDE TO 20MG  TWICE A DAY *If you need a refill on your cardiac medications before your next appointment, please call your pharmacy*    Follow-Up: At [facility], you and your health needs are our priority.  As part of our continuing mission to provide you with exceptional heart care, we have created designated Provider Care Teams.  These Care Teams include your primary Cardiologist (physician) and Advanced Practice Providers (APPs -  Physician Assistants and Nurse Practitioners) who all work together to provide you with the care you need, when you need it.  We recommend signing up for the patient portal called "MyChart".  Sign up information is provided on this After Visit Summary.  MyChart is used to connect with patients for Virtual Visits (Telemedicine).  Patients are able to view lab/test results, encounter notes, upcoming appointments, etc.  Non-urgent messages can be sent to your provider as well.   To learn more about what you can do with MyChart, go to [facility].    Your next appointment:   12 month(s)  The format for your next appointment:   In Person  Provider:   [email or web address], MD   Other Instructions: MONITOR YOUR BP. IF CONSISTENTLY LOW WITH THE DECREASE IN MEDICATIONS CALL OUR OFFICE AND GET SOONER APPT WITH DR.C OR AN APP

## 2020-01-24 NOTE — Progress Notes (Signed)
Cardiology Office Note    Date:  02/04/2020   ID:  Justin Rasmussen, Justin Rasmussen Justin Rasmussen 06, 1957, MRN [id number]  PCP:  [name], PA  Cardiologist:  Nicki Guadalajara, MD (sleep); [name]  New sleep evaluation  History of Present Illness:  Justin Rasmussen is a 64 y.o. male who is followed by [name] cardiology care who was recently diagnosed with obstructive sleep apnea and presents for initial evaluation with me.  [name] has a history recently diagnosed heart failure with reduced EF, nonischemic cardiomyopathy on a background of chronic cor pulmonale, chronic respiratory failure on oxygen, morbid obesity, and chronic elevation of his right hemidiaphragm.  In September 2020 EF was 25% by MRI and 30 to 35% by echo without late gadolinium enhancement and are mild.  He was found to have mild to moderate pulmonary hypertension with PA pressure at 44 mm and wedge pressure 29 mm.  His most recent echo Doppler study on September 12, 2019 showed an EF at 40 to 45% without LVH.  There was grade 1 diastolic dysfunction.  Estimated PA pressure was 36 mm.    He has a history of significant obesity and due to concerns for obstructive sleep apnea he was referred for a diagnostic polysomnogram which was done on July 04, 2019.  This revealed borderline mild overall sleep apnea overall but moderate sleep apnea during REM sleep with rem AHI at 25.3/h.  He had oxygen desaturation to 88%.  There was moderate snoring.  With his cardiovascular comorbidities he was referred for CPAP titration study which was done on September 23, 2019.  CPAP was initiated at 6 cm and was titrated to optimal pressure 10 cm.  Apparently, there was delay in initiating CPAP therapy and CPAP was not begun until January 01, 2020.  A download in the office today confirms 100% compliance from his initiation date of April 27 but this is not yet 30 days.  Average usage on days used was 6 hours and 36 minutes.  At a set pressure of 10 cm, AHI was  4.2/h.  Typically he goes to bed at 1030 to 11 PM and wakes up at 6 AM.  Over the past 3 weeks since he has had the CPAP machine he does note significant improvement in feeling more alert and having more energy.  Nocturia has improved.  He is unaware of breakthrough snoring.  He denies residual daytime sleepiness and an Epworth Sleepiness Scale score was calculated the office today and this endorsed at 2. He denies bruxism, restless legs, hypnagogic hallucinations or cataplectic events.   Past Medical History:  Diagnosis Date  . 2019 novel coronavirus disease (COVID-19) 07/2019  . COPD (chronic obstructive pulmonary disease) (HCC)   . HFrEF (heart failure with reduced ejection fraction) (HCC)   . Nonischemic cardiomyopathy (HCC)   . OSA (obstructive sleep apnea)   . Pulmonary hypertension (HCC)    WHO group II    Past Surgical History:  Procedure Laterality Date  . ADENOIDECTOMY    . RIGHT/LEFT HEART CATH AND CORONARY ANGIOGRAPHY N/A 06/05/2019   Procedure: RIGHT/LEFT HEART CATH AND CORONARY ANGIOGRAPHY;  Surgeon: Corky Crafts, MD;  Location: [facility] INVASIVE CV LAB;  Service: Cardiovascular;  Laterality: N/A;    Current Medications: Outpatient Medications Prior to Visit  Medication Sig Dispense Refill  . aspirin 81 MG chewable tablet Chew 1 tablet (81 mg total) by mouth daily. 30 tablet 0  . carvedilol (COREG) 6.25 MG tablet TAKE 1 TABLET BY MOUTH TWICE  DAILY WITH A MEAL 180 tablet 3  . cetirizine (ZYRTEC) 10 MG tablet Take 10 mg by mouth daily as needed for allergies or rhinitis.    . fluticasone (FLONASE) 50 MCG/ACT nasal spray Place 2 sprays into both nostrils daily. 16 g 11  . potassium chloride 20 MEQ TBCR Take 20 mEq by mouth daily. X3D THEN BACK TO 30 tablet 3  . sacubitril-valsartan (ENTRESTO) 49-51 MG Take 1 tablet by mouth 2 (two) times daily. 60 tablet 6  . spironolactone (ALDACTONE) 25 MG tablet Take 1 tablet (25 mg total) by mouth daily. 90 tablet 3  .  tiotropium (SPIRIVA HANDIHALER) 18 MCG inhalation capsule Place 1 capsule (18 mcg total) into inhaler and inhale daily. 90 capsule 3  . torsemide (DEMADEX) 20 MG tablet Take 2 tablets (40 mg total) by mouth 2 (two) times daily. 30 tablet 3   No facility-administered medications prior to visit.     Allergies:   Patient has no known allergies.   Social History   Socioeconomic History  . Marital status: Married    Spouse name: Not on file  . Number of children: Not on file  . Years of education: Not on file  . Highest education level: Not on file  Occupational History  . Not on file  Tobacco Use  . Smoking status: Former Smoker    Packs/day: 1.00    Years: 15.00    Pack years: 15.00    Types: Cigarettes    Quit date: 05/31/1989    Years since quitting: 30.7  . Smokeless tobacco: Never Used  Substance and Sexual Activity  . Alcohol use: Not Currently  . Drug use: Never  . Sexual activity: Not on file  Other Topics Concern  . Not on file  Social History Narrative  . Not on file   Social Determinants of Health   Financial Resource Strain:   . Difficulty of Paying Living Expenses:   Food Insecurity:   . Worried About Programme researcher, broadcasting/film/video in the Last Year:   . Barista in the Last Year:   Transportation Needs:   . Freight forwarder (Medical):   Marland Kitchen Lack of Transportation (Non-Medical):   Physical Activity: Unknown  . Days of Exercise per Week: Patient refused  . Minutes of Exercise per Session: Patient refused  Stress: No Stress Concern Present  . Feeling of Stress : Only a little  Social Connections: Unknown  . Frequency of Communication with Friends and Family: Not on file  . Frequency of Social Gatherings with Friends and Family: Not on file  . Attends Religious Services: Not on file  . Active Member of Clubs or Organizations: Not on file  . Attends Banker Meetings: Not on file  . Marital Status: Married    Socially he is married for 30  years.  He has 1 child age 7.  Family History:  The patient's family history includes Diabetes in his father and sister; Heart attack in his father; Heart disease in his sister; Kidney disease in his mother.   His father died at age 55 with an MI.  Mother died at age 55 with kidney failure.  He has 1 sister age 69 who has heart problems.  ROS General: Negative; No fevers, chills, or night sweats; morbid obesity HEENT: Negative; No changes in vision or hearing, sinus congestion, difficulty swallowing Pulmonary: Negative; No cough, wheezing, shortness of breath, hemoptysis Cardiovascular: Negative; No chest pain, presyncope, syncope, palpitations  GI: Negative; No nausea, vomiting, diarrhea, or abdominal pain GU: Negative; No dysuria, hematuria, or difficulty voiding Musculoskeletal: Negative; no myalgias, joint pain, or weakness Hematologic/Oncology: Negative; no easy bruising, bleeding Endocrine: Negative; no heat/cold intolerance; no diabetes Neuro: Negative; no changes in balance, headaches Skin: Negative; No rashes or skin lesions Psychiatric: Negative; No behavioral problems, depression Sleep: Negative; No snoring, daytime sleepiness, hypersomnolence, bruxism, restless legs, hypnogognic hallucinations, no cataplexy Other comprehensive 14 point system review is negative.   PHYSICAL EXAM:   VS:  BP (!) 84/50   Pulse 72   Ht 5\' 9"  (1.753 m)   Wt (!) 313 lb 3.2 oz (142.1 kg)   SpO2 98%   BMI 46.25 kg/m     Repeat blood pressure by me was 88/58 supine and 92/64 standing  Wt Readings from Last 3 Encounters:  01/24/20 (!) 313 lb 3.2 oz (142.1 kg)  12/19/19 (!) 308 lb (139.7 kg)  11/22/19 (!) 308 lb (139.7 kg)    General: Alert, oriented, no distress.  Skin: normal turgor, no rashes, warm and dry HEENT: Normocephalic, atraumatic. Pupils equal round and reactive to light; sclera anicteric; extraocular muscles intact; Fundi ** Nose without nasal septal hypertrophy Mouth/Parynx  benign; Mallinpatti scale Neck: No JVD, no carotid bruits; normal carotid upstroke Lungs: clear to ausculatation and percussion; no wheezing or rales Chest wall: without tenderness to palpitation Heart: PMI not displaced, RRR, s1 s2 normal, 1/6 systolic murmur, no diastolic murmur, no rubs, gallops, thrills, or heaves Abdomen: soft, nontender; no hepatosplenomehaly, BS+; abdominal aorta nontender and not dilated by palpation. Back: no CVA tenderness Pulses 2+ Musculoskeletal: full range of motion, normal strength, no joint deformities Extremities: no clubbing cyanosis or edema, Homan's sign negative  Neurologic: grossly nonfocal; Cranial nerves grossly wnl Psychologic: Normal mood and affect   Studies/Labs Reviewed:   EKG:  EKG is not  ordered today.  I personally reviewed the ECG from October 17, 2019 which shows sinus rhythm at 76 bpm with right bundle branch block and associated repolarization changes.  Recent Labs: BMP Latest Ref Rng & Units 11/22/2019 11/13/2019 07/13/2019  Glucose 65 - 99 mg/dL 562(Z) 308(M) 80  BUN 8 - 27 mg/dL 57(Q) 21 22  Creatinine 0.76 - 1.27 mg/dL 4.69(G) 2.95 2.84  BUN/Creat Ratio 10 - 24 35(H) 22 22  Sodium 134 - 144 mmol/L 141 141 143  Potassium 3.5 - 5.2 mmol/L 4.2 4.3 4.2  Chloride 96 - 106 mmol/L 93(L) 97 92(L)  CO2 20 - 29 mmol/L 31(H) 29 33(H)  Calcium 8.6 - 10.2 mg/dL 9.7 9.4 9.5     Hepatic Function Latest Ref Rng & Units 06/07/2019 06/03/2019 06/01/2019  Total Protein 6.5 - 8.1 g/dL - 5.7(L) 6.4(L)  Albumin 3.5 - 5.0 g/dL 3.1(L) 3.5 3.9  AST 15 - 41 U/L - 24 24  ALT 0 - 44 U/L - 25 28  Alk Phosphatase 38 - 126 U/L - 54 64  Total Bilirubin 0.3 - 1.2 mg/dL - 1.2 0.8    CBC Latest Ref Rng & Units 06/05/2019 06/05/2019 06/05/2019  WBC 4.0 - 10.5 K/uL - - 9.1  Hemoglobin 13.0 - 17.0 g/dL 13.2 44.0 10.2  Hematocrit 39.0 - 52.0 % 48.0 46.0 47.7  Platelets 150 - 400 K/uL - - 168   Lab Results  Component Value Date   MCV 92.3 06/05/2019   MCV  94.1 06/03/2019   MCV 94.0 06/02/2019   Lab Results  Component Value Date   TSH 1.966 06/04/2019   Lab Results  Component Value Date   HGBA1C 6.0 (H) 06/02/2019     BNP    Component Value Date/Time   BNP 322.5 (H) 06/01/2019 1630    ProBNP No results found for: PROBNP   Lipid Panel     Component Value Date/Time   CHOL 154 06/06/2019 0457   TRIG 76 06/06/2019 0457   HDL 34 (L) 06/06/2019 0457   CHOLHDL 4.5 06/06/2019 0457   VLDL 15 06/06/2019 0457   LDLCALC 105 (H) 06/06/2019 0457     RADIOLOGY: No results found.   Additional studies/ records that were reviewed today include:  I reviewed the records of [name], the patient's echo Doppler evaluations, both his diagnostic as well as CPAP titration study.  A download was obtained in the office today.  Epworth Sleepiness Scale score was calculated in the office today.  ASSESSMENT:    1. OSA (obstructive sleep apnea)   2. Acute systolic congestive heart failure (HCC)   3. Hypotension due to drugs   4. Cor pulmonale, chronic (HCC)   5. Morbid obesity (HCC)   6. PAH (pulmonary artery hypertension) (HCC)   7. Sleep-related hypoventilation     PLAN:  Ewart Pande is a 64 year old gentleman who has a history of recently diagnosed heart failure with reduced EF felt to be a nonischemic cardiomyopathy as well as a history of chronic cor pulmonale with respiratory failure on oxygen, morbid obesity and elevation of his right hemidiaphragm.  He was found to have obstructive sleep apnea which was borderline overall but moderate during REM sleep with rim sleep AHI at 25.3/h.  There has been some delay in his CPAP initiation but ultimately is set up date appears to be April 27.  Since CPAP initiation he has used it with 100% compliance although he has not yet had 30 days.  At 10 cm water pressure AHI is 4.2.  Average sleep duration is 6 hours and 36 minutes.  I reviewed his sleep study and CPAP titration study with him in  detail.  I discussed with him the effects of sleep apnea on normal sleep architecture and specifically discussed the potential adverse consequences of untreated sleep apnea with reference to his cardiovascular health.  I discussed its role in potential blood pressure issues, nocturnal arrhythmias including increased incidence of atrial fibrillation with increased recurrence of AF following successful cardioversion if left untreated, the potential effects of nocturnal hypoxemia both with reference to cardiac and cerebrovascular ischemic events.  I also discussed its implication with glucose metabolism, GERD increase as well as increased inflammation.  His most recent echo Doppler study has shown improvement of LV function and EF is now 40 to 45%.  His blood pressure today is low without orthostatic change with systolics in the upper 80s to low 90s.  He has been on carvedilol 6.25 mg twice a day, Entresto 49/51 mg twice a day, in addition to torsemide 40 mg twice a day as well as spironolactone 25 mg daily.  I have recommended he reduce his torsemide down to 20 mg twice daily and if possible if blood pressure is still low further dose reduction will be necessary.  I have recommended he follow-up with Dr. Levy Sjogren tomorrow.  I answered all his questions.  I will contact the DME company so that a follow-up download after 30 days can be completed but he is compliant.  As long as he remains stable from a sleep perspective I will see him in 1 year for reevaluation.  He will  return to the cardiology care of [name].  Time spent: 40 minutes  Medication Adjustments/Labs and Tests Ordered: Current medicines are reviewed at length with the patient today.  Concerns regarding medicines are outlined above.  Medication changes, Labs and Tests ordered today are listed in the Patient Instructions below. Patient Instructions  Medication Instructions:  DECREASE TORSEMIDE TO 20MG  TWICE A DAY *If you need a refill on your  cardiac medications before your next appointment, please call your pharmacy*    Follow-Up: At [facility], you and your health needs are our priority.  As part of our continuing mission to provide you with exceptional heart care, we have created designated Provider Care Teams.  These Care Teams include your primary Cardiologist (physician) and Advanced Practice Providers (APPs -  Physician Assistants and Nurse Practitioners) who all work together to provide you with the care you need, when you need it.  We recommend signing up for the patient portal called "MyChart".  Sign up information is provided on this After Visit Summary.  MyChart is used to connect with patients for Virtual Visits (Telemedicine).  Patients are able to view lab/test results, encounter notes, upcoming appointments, etc.  Non-urgent messages can be sent to your provider as well.   To learn more about what you can do with MyChart, go to [email or web address].    Your next appointment:   12 month(s)  The format for your next appointment:   In Person  Provider:   Nicki Guadalajara, MD   Other Instructions: MONITOR YOUR BP. IF CONSISTENTLY LOW WITH THE DECREASE IN MEDICATIONS CALL OUR OFFICE AND GET SOONER APPT WITH DR.C OR AN APP     Signed, Nicki Guadalajara, MD  02/04/2020 4:46 PM    [facility] [address], [place], [place]  [address] Phone: [phone]

## 2020-02-04 ENCOUNTER — Encounter: Payer: Self-pay | Admitting: Cardiovascular Disease

## 2020-03-20 ENCOUNTER — Ambulatory Visit (INDEPENDENT_AMBULATORY_CARE_PROVIDER_SITE_OTHER): Payer: Medicaid Other | Admitting: Cardiovascular Disease

## 2020-03-20 ENCOUNTER — Other Ambulatory Visit: Payer: Self-pay

## 2020-03-20 ENCOUNTER — Encounter: Payer: Self-pay | Admitting: Cardiovascular Disease

## 2020-03-20 VITALS — BP 106/62 | HR 66 | Ht 69.0 in | Wt 322.0 lb

## 2020-03-20 DIAGNOSIS — Z79899 Other long term (current) drug therapy: Secondary | ICD-10-CM

## 2020-03-20 DIAGNOSIS — I5022 Chronic systolic (congestive) heart failure: Secondary | ICD-10-CM | POA: Diagnosis not present

## 2020-03-20 DIAGNOSIS — E662 Morbid (severe) obesity with alveolar hypoventilation: Secondary | ICD-10-CM

## 2020-03-20 DIAGNOSIS — J9612 Chronic respiratory failure with hypercapnia: Secondary | ICD-10-CM

## 2020-03-20 DIAGNOSIS — I2721 Secondary pulmonary arterial hypertension: Secondary | ICD-10-CM

## 2020-03-20 DIAGNOSIS — J9611 Chronic respiratory failure with hypoxia: Secondary | ICD-10-CM | POA: Diagnosis not present

## 2020-03-20 DIAGNOSIS — I471 Supraventricular tachycardia: Secondary | ICD-10-CM

## 2020-03-20 DIAGNOSIS — I2781 Cor pulmonale (chronic): Secondary | ICD-10-CM

## 2020-03-20 DIAGNOSIS — G4733 Obstructive sleep apnea (adult) (pediatric): Secondary | ICD-10-CM

## 2020-03-20 MED ORDER — SACUBITRIL-VALSARTAN 97-103 MG PO TABS: 1.0000 | ORAL_TABLET | Freq: Two times a day (BID) | ORAL | 11 refills | Status: DC

## 2020-03-20 NOTE — Patient Instructions (Addendum)
Medication Instructions:  INCREASE Entresto to 97/103 mg twice daily  *If you need a refill on your cardiac medications before your next appointment, please call your pharmacy*   Lab Work: Your provider would like for you to return in 2 weeks to have the following labs drawn: BMET and BNP. You do not need an appointment for the lab. Once in our office lobby there is a podium where you can sign in and ring the doorbell to alert [place] that you are here. The lab is open from 8:00 am to 4:30 pm; closed for lunch from 12:45pm-1:45pm.  If you have labs (blood work) drawn today and your tests are completely normal, you will receive your results only by: Marland Kitchen MyChart Message (if you have MyChart) OR . A paper copy in the mail If you have any lab test that is abnormal or we need to change your treatment, we will call you to review the results.   Testing/Procedures: None ordered   Follow-Up: At [facility], you and your health needs are our priority.  As part of our continuing mission to provide you with exceptional heart care, we have created designated Provider Care Teams.  These Care Teams include your primary Cardiologist (physician) and Advanced Practice Providers (APPs -  Physician Assistants and Nurse Practitioners) who all work together to provide you with the care you need, when you need it.  We recommend signing up for the patient portal called "MyChart".  Sign up information is provided on this After Visit Summary.  MyChart is used to connect with patients for Virtual Visits (Telemedicine).  Patients are able to view lab/test results, encounter notes, upcoming appointments, etc.  Non-urgent messages can be sent to your provider as well.   To learn more about what you can do with MyChart, go to [email or web address].    Your next appointment:   Follow up in 3 months with Vergia Alcon, NP Follow up in 6 months with [name], MD

## 2020-03-20 NOTE — Progress Notes (Signed)
Cardiology Office Note:    Date:  03/20/2020   ID:  Justin Rasmussen, DOB 1956/07/05, MRN [id number]  PCP:  [name], PA  Cardiologist:  [name], MD  Electrophysiologist:  None   Referring MD: [name]*   Chief Complaint  Patient presents with   Congestive Heart Failure    History of Present Illness:    Justin Rasmussen is a 64 y.o. male with a hx of heart failure with reduced ejection fraction, nonischemic cardiomyopathy, on a background of chronic cor pulmonale, chronic respiratory failure on oxygen, morbid obesity and chronic elevation of the right hemidiaphragm.  He initially presented in September 2020 with massive weight gain and edema as well as shortness of breath at rest.  His ejection fraction was 25% by MRI and 30-35% by echocardiography, without evidence of late gadolinium enhancement on MRI.  Cardiac catheterization showed mild coronary atherosclerosis with no stenoses greater than 10%.  He had pulmonary hypertension of mixed etiology with a systolic PA pressure of 44 mmHg and a wedge pressure of 29 mmHg.  After initiation of treatment with Entresto and carvedilol EF has improved to 40-45% and his most recent echocardiogram showed findings consistent with normal left heart filling pressures.  Repeat pressures remain mildly elevated with systolic estimated at 36 mmHg.  He continues to have NYHA functional class II-3A shortness of breath.  He uses a buggy when he goes grocery shopping and become short of breath climbing stairs but never uses oxygen at home during activities of daily living including getting dressed and making the bed.  He does not have orthopnea, PND or lower extremity edema.  He has not had dizziness syncope or palpitations.  He denies claudication or any focal neurological events.  Weight at hospital discharge was much lower at 298 pounds.  Today both his home scale in our office scale agree that he weighs 322 pounds but he does  not have edema or other visible signs of hypervolemia.  He reports compliance with CPAP.  Past Medical History:  Diagnosis Date   2019 novel coronavirus disease (COVID-19) 07/2019   COPD (chronic obstructive pulmonary disease) (HCC)    HFrEF (heart failure with reduced ejection fraction) (HCC)    Nonischemic cardiomyopathy (HCC)    OSA (obstructive sleep apnea)    Pulmonary hypertension (HCC)    WHO group II    Past Surgical History:  Procedure Laterality Date   ADENOIDECTOMY     RIGHT/LEFT HEART CATH AND CORONARY ANGIOGRAPHY N/A 06/05/2019   Procedure: RIGHT/LEFT HEART CATH AND CORONARY ANGIOGRAPHY;  Surgeon: Corky Crafts, MD;  Location: [facility];  Service: Cardiovascular;  Laterality: N/A;    Current Medications: Current Meds  Medication Sig   aspirin 81 MG chewable tablet Chew 1 tablet (81 mg total) by mouth daily.   carvedilol (COREG) 6.25 MG tablet TAKE 1 TABLET BY MOUTH TWICE DAILY WITH A MEAL   cetirizine (ZYRTEC) 10 MG tablet Take 10 mg by mouth daily as needed for allergies or rhinitis.   fluticasone (FLONASE) 50 MCG/ACT nasal spray Place 2 sprays into both nostrils daily.   potassium chloride 20 MEQ TBCR Take 20 mEq by mouth daily. X3D THEN BACK TO   spironolactone (ALDACTONE) 25 MG tablet Take 1 tablet (25 mg total) by mouth daily.   tiotropium (SPIRIVA HANDIHALER) 18 MCG inhalation capsule Place 1 capsule (18 mcg total) into inhaler and inhale daily.   torsemide (DEMADEX) 20 MG tablet Take 1 tablet (20  mg total) by mouth 2 (two) times daily.   [DISCONTINUED] sacubitril-valsartan (ENTRESTO) 49-51 MG Take 1 tablet by mouth 2 (two) times daily.     Allergies:   Patient has no known allergies.   Social History   Socioeconomic History   Marital status: Married    Spouse name: Not on file   Number of children: Not on file   Years of education: Not on file   Highest education level: Not on file  Occupational History    Not on file  Tobacco Use   Smoking status: Former Smoker    Packs/day: 1.00    Years: 15.00    Pack years: 15.00    Types: Cigarettes    Quit date: 05/31/1989    Years since quitting: 30.8   Smokeless tobacco: Never Used  Vaping Use   Vaping Use: Never used  Substance and Sexual Activity   Alcohol use: Not Currently   Drug use: Never   Sexual activity: Not on file  Other Topics Concern   Not on file  Social History Narrative   Not on file   Social Determinants of Health   Financial Resource Strain:    Difficulty of Paying Living Expenses:   Food Insecurity:    Worried About Programme researcher, broadcasting/film/video in the Last Year:    Barista in the Last Year:   Transportation Needs:    Freight forwarder (Medical):    Lack of Transportation (Non-Medical):   Physical Activity: Unknown   Days of Exercise per Week: Patient refused   Minutes of Exercise per Session: Patient refused  Stress: No Stress Concern Present   Feeling of Stress : Only a little  Social Connections: Unknown   Frequency of Communication with Friends and Family: Not on file   Frequency of Social Gatherings with Friends and Family: Not on file   Attends Religious Services: Not on file   Active Member of Clubs or Organizations: Not on file   Attends Banker Meetings: Not on file   Marital Status: Married     Family History: The patient's family history includes Diabetes in his father and sister; Heart attack in his father; Heart disease in his sister; Kidney disease in his mother.  ROS:   Please see the history of present illness.     All other systems reviewed and are negative.  EKGs/Labs/Other Studies Reviewed:    The following studies were reviewed today: Echocardiogram September 12, 2019 Sleep study and CPAP titration study.  EKG:  EKG is  ordered today.  The ekg ordered today demonstrates sinus rhythm, right bundle branch block (QRS 138 ms), QTC 470 ms, otherwise  normal.  Recent Labs: 06/01/2019: B Natriuretic Peptide 322.5 06/03/2019: ALT 25 06/04/2019: TSH 1.966 06/05/2019: Hemoglobin 16.3; Platelets 168 06/07/2019: Magnesium 2.0 11/22/2019: BUN 45; Creatinine, Ser 1.29; Potassium 4.2; Sodium 141  Recent Lipid Panel    Component Value Date/Time   CHOL 154 06/06/2019 0457   TRIG 76 06/06/2019 0457   HDL 34 (L) 06/06/2019 0457   CHOLHDL 4.5 06/06/2019 0457   VLDL 15 06/06/2019 0457   LDLCALC 105 (H) 06/06/2019 0457    Physical Exam:    VS:  BP 106/62    Pulse 66    Ht 5\' 9"  (1.753 m)    Wt (!) 322 lb (146.1 kg)    SpO2 99%    BMI 47.55 kg/m     Wt Readings from Last 3 Encounters:  03/20/20 (!) 322  lb (146.1 kg)  01/24/20 (!) 313 lb 3.2 oz (142.1 kg)  12/19/19 (!) 308 lb (139.7 kg)     General: Alert, oriented x3, no distress, morbidly obese Head: no evidence of trauma, PERRL, EOMI, no exophtalmos or lid lag, no myxedema, no xanthelasma; normal ears, nose and oropharynx Neck: Exam is difficult, but appears to have normal jugular venous pulsations and no hepatojugular reflux; brisk carotid pulses without delay and no carotid bruits Chest: clear to auscultation, no signs of consolidation by percussion or palpation, normal fremitus, symmetrical and full respiratory excursions Cardiovascular: normal position and quality of the apical impulse, regular rhythm, normal first and widely split second heart sounds, no murmurs, rubs or gallops Abdomen: no tenderness or distention, no masses by palpation, no abnormal pulsatility or arterial bruits, normal bowel sounds, no hepatosplenomegaly Extremities: no clubbing, cyanosis or edema; 2+ radial, ulnar and brachial pulses bilaterally; 2+ right femoral, posterior tibial and dorsalis pedis pulses; 2+ left femoral, posterior tibial and dorsalis pedis pulses; no subclavian or femoral bruits Neurological: grossly nonfocal Psych: Normal mood and affect   ASSESSMENT:    1. Chronic systolic congestive heart  failure (HCC)   2. Cor pulmonale (chronic) (HCC)   3. PAH (pulmonary artery hypertension) (HCC)   4. Chronic respiratory failure with hypoxia and hypercapnia (HCC)   5. OSA (obstructive sleep apnea)   6. Obesity hypoventilation syndrome (HCC)   7. Morbid obesity (HCC)   8. PAT (paroxysmal atrial tachycardia) (HCC)   9. Medication management    PLAN:    In order of problems listed above:  1. Chronic systolic heart failure: He has good functional status, but weight is higher compared to hospital DC. Not sure if he has gained real weight. Body habitus limits the exam for volume status. Call if weight is 325 lb or higher. Increase to max dose Entresto today an 2. Chronic cor pulmonale/right heart failure:   No evidence of edema today.  JVP probably normal. 3. PAH: due to both left heart failure and intrinsic arteriolar disease due to chronic respiratory failure. 4. Chronic respiratory failure with hypoxia and hypercapnia: Wearing oxygen 2 L nasal cannula 24 hours a day, but seems to do okay without it at rest. 5. OSA/obesity hypoventilation syndrome: Today tried to  expedite the CPAP equipment delivery. 6. Morbid obesity: Underlying cause of most of his comorbid conditions including sleep apnea, chronic respiratory failure and pulmonary hypertension. 7. PAT:  Clinically asymptomatic.   Medication Adjustments/Labs and Tests Ordered: Current medicines are reviewed at length with the patient today.  Concerns regarding medicines are outlined above.  Orders Placed This Encounter  Procedures   Basic metabolic panel   Brain natriuretic peptide   Meds ordered this encounter  Medications   sacubitril-valsartan (ENTRESTO) 97-103 MG    Sig: Take 1 tablet by mouth 2 (two) times daily.    Dispense:  60 tablet    Refill:  11    Patient Instructions  Medication Instructions:  INCREASE Entresto to 97/103 mg twice daily  *If you need a refill on your cardiac medications before your next  appointment, please call your pharmacy*   Lab Work: Your provider would like for you to return in 2 weeks to have the following labs drawn: BMET and BNP. You do not need an appointment for the lab. Once in our office lobby there is a podium where you can sign in and ring the doorbell to alert [place] that you are here. The lab is open from 8:00 am  to 4:30 pm; closed for lunch from 12:45pm-1:45pm.  If you have labs (blood work) drawn today and your tests are completely normal, you will receive your results only by:  MyChart Message (if you have MyChart) OR  A paper copy in the mail If you have any lab test that is abnormal or we need to change your treatment, we will call you to review the results.   Testing/Procedures: None ordered   Follow-Up: At [facility], you and your health needs are our priority.  As part of our continuing mission to provide you with exceptional heart care, we have created designated Provider Care Teams.  These Care Teams include your primary Cardiologist (physician) and Advanced Practice Providers (APPs -  Physician Assistants and Nurse Practitioners) who all work together to provide you with the care you need, when you need it.  We recommend signing up for the patient portal called "MyChart".  Sign up information is provided on this After Visit Summary.  MyChart is used to connect with patients for Virtual Visits (Telemedicine).  Patients are able to view lab/test results, encounter notes, upcoming appointments, etc.  Non-urgent messages can be sent to your provider as well.   To learn more about what you can do with MyChart, go to [email or web address].    Your next appointment:   Follow up in 3 months with Vergia Alcon, NP Follow up in 6 months with [name], MD     Signed, [name], MD  03/20/2020 8:36 AM    [facility]

## 2020-05-17 LAB — BASIC METABOLIC PANEL
BUN/Creatinine Ratio: 21 (ref 10–24)
BUN: 24 mg/dL (ref 8–27)
CO2: 29 mmol/L (ref 20–29)
Calcium: 8.8 mg/dL (ref 8.6–10.2)
Chloride: 101 mmol/L (ref 96–106)
Creatinine, Ser: 1.15 mg/dL (ref 0.76–1.27)
GFR calc Af Amer: 78 mL/min/{1.73_m2} (ref >59)
GFR calc non Af Amer: 68 mL/min/{1.73_m2} (ref >59)
Glucose: 126 mg/dL — ABNORMAL HIGH (ref 65–99)
Potassium: 4.5 mmol/L (ref 3.5–5.2)
Sodium: 140 mmol/L (ref 134–144)

## 2020-05-17 LAB — BRAIN NATRIURETIC PEPTIDE: BNP: 10 pg/mL (ref 0.0–100.0)

## 2020-06-10 IMAGING — MR MR CARD MORPHOLOGY WO/W CM
7 of 9 series · 38 of 40 positions shown · IV contrast (gadavist)
Comparison: none

CLINICAL DATA: Cardiomyopathy of uncertain etiology.

EXAM:
CARDIAC MRI
TECHNIQUE: The patient was scanned on a 1.5 Tesla GE magnet. A dedicated
cardiac coil was used. Functional imaging was done using Fiesta
sequences. [DATE], and 4 chamber views were done to assess for RWMA's.
Modified Berhanu rule using a short axis stack was used to
calculate an ejection fraction on a dedicated work station using
Circle software. The patient received 8 cc of Gadavist. After 10
minutes inversion recovery sequences were used to assess for
infiltration and scar tissue.

[Series 6: bSSFP · oblique · 8.0mm · 1.61mm/px · 31 of 425 slices shown (1 of 3)]
[im 1/425]
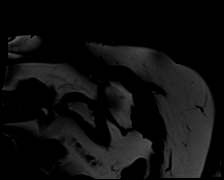
[im 15/425]
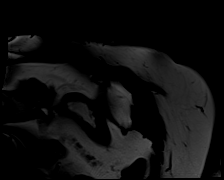
[im 29/425]
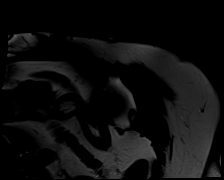
[im 43/425]
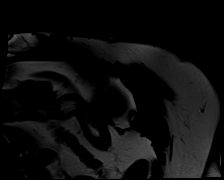
[im 57/425]
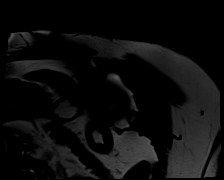
[im 71/425]
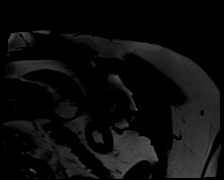
[im 85/425]
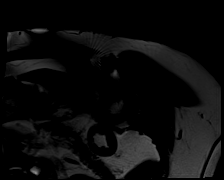
[im 99/425]
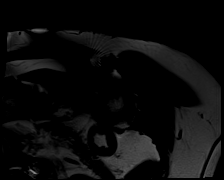
[im 114/425]
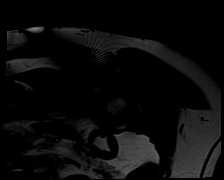
[im 128/425]
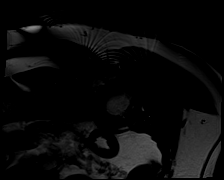
[im 142/425]
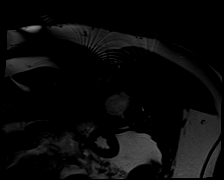
[im 156/425]
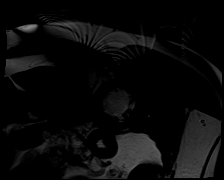
[im 170/425]
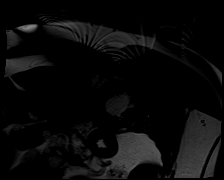
[im 184/425]
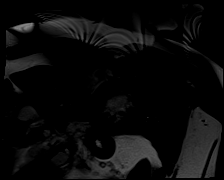
[im 198/425]
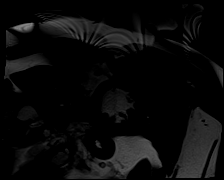
[im 213/425]
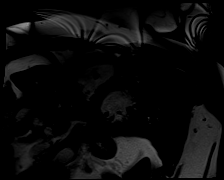
[im 227/425]
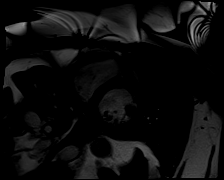
[im 241/425]
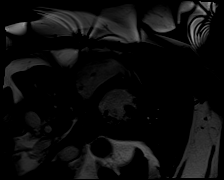
[im 255/425]
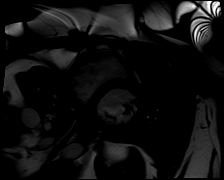
[im 269/425]
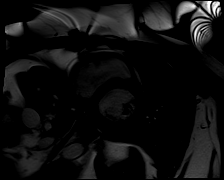
[im 283/425]
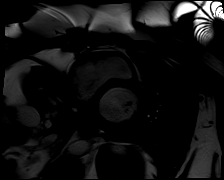
[im 297/425]
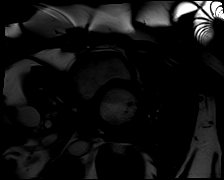
[im 311/425]
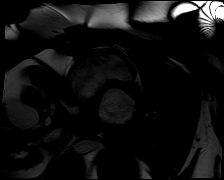
[im 326/425]
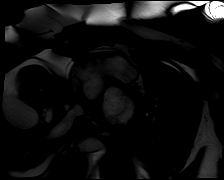
[im 340/425]
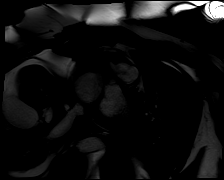
[im 354/425]
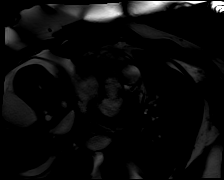
[im 368/425]
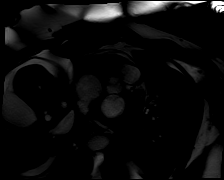
[im 382/425]
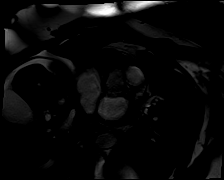
[im 396/425]
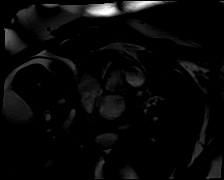
[im 410/425]
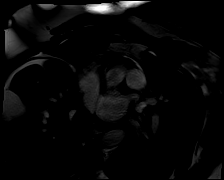
[im 425/425]
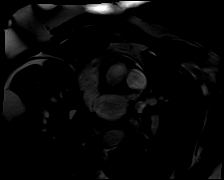

[Series 9: t2_stir_db_sax · oblique · 8.0mm · 1.83mm/px · 1 of 15 slices shown]
[im 1/15]
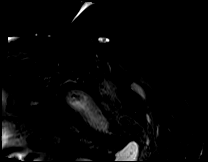

[Series 12: bSSFP · oblique · 6.0mm · 1.48mm/px · 1 of 25 slices shown (2 of 3)]
[im 1/25]
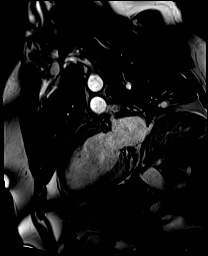

[Series 13: bSSFP · oblique · 6.0mm · 1.48mm/px · 2 of 25 slices shown (3 of 3)]
[im 1/25]
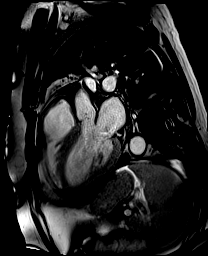
[im 25/25]
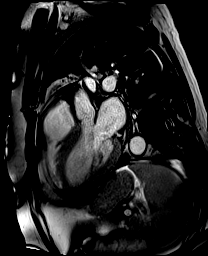

[Series 17: lge_single shot radial_mag · axial · 6.0mm · 2.08mm/px · 1 of 1 slices shown]
[im 1/1]
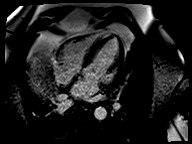

[Series 18: lge_single shot radial_psir · axial · 6.0mm · 2.08mm/px · 1 of 1 slices shown]
[im 1/1]
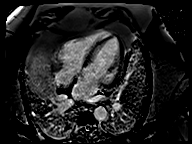

[Series 25: lge short axis_psir · oblique · 8.0mm · 1.79mm/px · 1 of 14 slices shown]
[im 1/14]
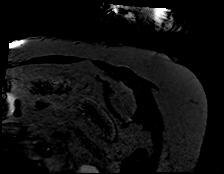

[38 of 40 positions shown; findings below may reference images not displayed]

FINDINGS: Low lung volumes and basilar atelectasis.

Technicallly difficult study due to respiratory motion. Mildly
dilated left ventricle with mild LV hypertrophy. Diffuse hypokinesis
with septal bounce noted, suspect this is due to RV pressure/volume
overload. EF calculated 16% but visually appears in the 25% range
(difficult images for quantification due to motion artifact).
Moderately dilated right ventricle with severely decreased systolic
function, EF 25% (visually more moderate dysfunction, again
difficult quantification due to motion artifact). Mild left and
right atrial enlargement. Trileaflet aortic valve with no
regurgitation or stenosis. No significant mitral regurgitation
noted.

On delayed enhancement imaging, there was no myocardial late
gadolinium enhancement (LGE).

Measurements:

LVEDV 231 mL

LVSV 38 mL

LVEF 16%

RVEDV 265 mL

RVSV 65 mL

RVEF 25%
IMPRESSION: 1. Mildly dilated LV with mild LV hypertrophy. EF calculated 16% but
looks in the 25% range visually (difficult study due to motion
artifact). Septal bounce with diffuse hypokinesis, suspect RV
pressure/volume overload.

2. Moderately dilated RV with severely decreased systolic function,
EF 25% (visually more moderate dysfunction).

3. No myocardial LGE so no definitive evidence for prior MI,
myocarditis, or infiltrative disease.

Blain Jumper

## 2020-06-18 ENCOUNTER — Encounter: Payer: Self-pay | Admitting: General Practice

## 2020-07-01 NOTE — Progress Notes (Signed)
Virtual Visit via Telephone Note   This visit type was conducted due to national recommendations for restrictions regarding the COVID-19 Pandemic (e.g. social distancing) in an effort to limit this patient's exposure and mitigate transmission in our community.  Due to his co-morbid illnesses, this patient is at least at moderate risk for complications without adequate follow up.  This format is felt to be most appropriate for this patient at this time.  The patient did not have access to video technology/had technical difficulties with video requiring transitioning to audio format only (telephone).  All issues noted in this document were discussed and addressed.  No physical exam could be performed with this format.  Please refer to the patient's chart for his  consent to telehealth for [facility].  Evaluation Performed:  Follow-up visit  This visit type was conducted due to national recommendations for restrictions regarding the COVID-19 Pandemic (e.g. social distancing).  This format is felt to be most appropriate for this patient at this time.  All issues noted in this document were discussed and addressed.  No physical exam was performed (except for noted visual exam findings with Video Visits).  Please refer to the patient's chart (MyChart message for video visits and phone note for telephone visits) for the patient's consent to telehealth for [facility]  Date:  07/01/2020   ID:  Justin Rasmussen, DOB 03-19-1956, MRN [id number]  Patient Location:  [address] [place] [address]   Provider location:     [facility] [address] Office [phone] Fax [phone]   PCP:  [name], PA  Cardiologist:  [name], MD  Electrophysiologist:  None   Chief Complaint: Follow-up for systolic CHF/cardiomyopathy  History of Present Illness:    Justin Rasmussen is a 65 y.o. male who presents via audio/video  conferencing for a telehealth visit today.  Patient verified DOB and address.  He has a PMH ofacute systolic congestive heart failure, cardiomyopathy of unknown etiology, RBBB, cor pulmonale chronic, elevated hemidiaphragm, and morbid obesity.  He was admitted [facility] on 06/01/2019 with increased dyspnea on exertion over the prior 2 to 3 weeks. He underwent cardiac catheterization on 06/05/2019 which showed nonischemic cardiomyopathy and moderate pulmonary hypertension. A cardiac MRI on 06/06/2019 showed mildly dilated left ventricle with mild left ventricular hypertrophy and an EF calculated at 16% however, by visual estimate looks closer to 25%, diffuse hypokinesis moderately dilated right ventricle with severely decreased systolic function EF 25% and no myocardial LGE (no definitive evidence of prior MI, myocarditis, or infiltrative disease). His moderate pulmonary hypertension is thought to be due to his left heart failure and cor pulmonale, obesity hypoventilation syndrome/OSA and his morbid obesity. During that time he was also having episodes of PAT which were thought to be contributing to his heart failure as well.   He was seen by me on 06/19/2019 he statedhe feltmuch better since being discharged from the hospital.. His weighthad beenconsistent around 293-294 pounds at home and hewas293.4 at that visit. Hehadbeen increasing his activity slowly and doing chores around his house.However, hestatedhe waslimited due to the oxygen he wascarrying around. He continuedto use 3 L of oxygen with activity but states he has been able to titrate the oxygen down to 2.5 L when he is inactive.His losartan was decreased to 25 mg daily and his carvedilol was increased to 6.25 mg twice daily. A sleep evaluation was also ordered.  He presentedto the clinic 11/06/20and statedhewasfeeling  better. He continuedto walk daily and hadincreased to 15 to 20 minutes once daily. He continuedto  monitor his weight daily,298 lbs.He statedhe hadbeen having more convenient foods . Educated about packaged and fast food options. Hewasusing only 2 L of oxygen routinely and statedthat he wascontinuing to use his oxygen at night while sleeping. He didnot notice any increased heart rate or palpitations. I will increasedhis losartan,repeatedBMP, and again in 2 weeks. Planned to repeat echo in 2 months.  Repeat echocardiogram January 2021 showed substantial improvement with an LVEF of 40-45%, impaired relaxation, normal left atrial filling pressures, and an estimated systolic PA pressure of 36 mmHg.  He presentedto the clinic 3/5/2021and statedhe ran out of his [name] about a week and a halfprior. He hadalso noticed some increased work of breathing over the past week and a half. His weightwasup around 18 pounds. He statedthat through the day he didnot notice any increasedwork of breathing however, at night when he laidback he feltshortness of breath. He statedthat he hadnot been weighing daily and hadbeen eating more salty foods. He continuedto walk daily for about 30 minutes. His Entresto hadbeen approved. He continuedto use 2 L of oxygen through the day and at night. He hadnot received his CPAP supplies. I senta message to Dr. Tresa Endo. He was given the salty 6 sheet, is torsemide was increased to 60 mg x 3 days, and is also given potassium 20 mEq x 3 days.  He was seen virtually3/11/2021and statedhe was losing a pound per day while he was on his increased dose of torsemide. However, he weighed318 pounds which was2 pounds above where he wasthe priorweek. When asked about his fluid consumption he stated hewasconsuming around 125 ounces daily. He felthis home scalewasaccurate and hadbeen in line with the office scale. Hewastrying to eat a lower sodium diet although he admitedto it being somewhat difficult. I increasedhis torsemide  to 60 mg twice daily for 1 week and addedmetolazone 2.5 2 times per week to his medication regimen. He continuedon 2 L of oxygen.  He presented the clinic 11/22/19 for follow-up and stateshis breathing has returned to its baseline. He stated that with the addition of metolazone he  had much more urine output. He also stated that he felt the fluid restriction had made a difference. He had been chewing sugar-free gum to help with his mouth dryness. He continued to follow a low-sodium diet and has increased his physical activity. His weight was decreased back to 308 from 318 on 11/15/2019. I  decreased his torsemide to 40 mg twice daily, continued his metolazone 2.5 two times per week and continue fluid restriction.  He was seen virtually 12/19/19 for follow-up and stated he felt well.  He had noticed nausea with taking metolazone.  He indicated that each time he took the  metolazone he had nauseousness.  The days he did not take it he did not notice the symptoms.  His weight had been stable.  He had been trying to adhere to the fluid restriction and low-sodium diet.  He continued to be on 2 L of oxygen.  He still had not received his CPAP supplies from Dr. Tresa Endo.  I have encouraged him to maintain his diet, daily weights, and take an extra dose of torsemide for weight increase of 3 pounds overnight or 5 pounds in 1 week.  I will stopped his Zaroxolyn.  He was seen by [name] 03/20/2020 during that time he was at a NYHA functional class II-3a shortness of breath.  He was using a shopping cart when he would go to do his grocery shopping.  He noted becoming short of breath when climbing his stairs but was not using oxygen at home during activities of daily living such as getting dressed and making his bed.  He denied orthopnea, PND and lower extremity edema.  He also denied dizziness, syncope, and palpitations.  His weight at hospital discharge was 298 pounds.  At the time of his visit his scale at  home and scale in the clinic was 322 pounds however he did not have edema or other signs that he was fluid volume overloaded.  He reported compliance with his CPAP.  He is seen virtually today in follow-up and states he feels well.  He has been active taking his grandson places and doing activities with him.  He has been trying to stay away from high sodium foods and has been weighing himself most days.  His weight today is 327 pounds, which is up 5 pounds from his July visit.  He states he is compliant with medications and does not have any lower extremity swelling.  He continues to use 2 L of oxygen at night when sleeping.  I will continue his current medications, have instructed him to lose weight and will provide him with weight loss information.  We will have him follow-up in 3 months.  Today hedenies chest pain, increasedshortness of breath,increasedlower extremity edema, fatigue, palpitations, melena, hematuria, hemoptysis, diaphoresis, weakness, presyncope, syncope,and PND.  The patient does symptoms concerning for COVID-19 infection (fever, chills, cough, or new SHORTNESS OF BREATH).    Prior CV studies:   The following studies were reviewed today:  Cardiac catheterization 06/04/2017  Mid LAD lesion is 10% stenosed.  Prox RCA lesion is 10% stenosed.  LV end diastolic pressure is moderately elevated.  There is no aortic valve stenosis.  Hemodynamic findings consistent with moderate pulmonary hypertension.  Ao sat 97%, PA sat 65%, mean PA pressure 44 mm Hg; mean PCWP 29 mm Hg; CO 5.1 L/min; CI 2.08  Nonischemic cardiomyopathy.  Moderate pulmonary HTN.  Echocardiogram 09/12/2019 IMPRESSIONS    1. Left ventricular ejection fraction, by visual estimation, is 40 to  45%. The left ventricle has mildly decreased function. There is no left  ventricular hypertrophy.  2. Left ventricular diastolic parameters are consistent with Grade I  diastolic dysfunction (impaired  relaxation).  3. Septal bounce present.  4. The left ventricle demonstrates global hypokinesis.  5. Global right ventricle has normal systolic function.The right  ventricular size is normal. No increase in right ventricular wall  thickness.  6. Left atrial size was normal.  7. Right atrial size was normal.  8. Presence of pericardial fat pad.  9. The mitral valve is grossly normal. Trivial mitral valve  regurgitation.  10. The tricuspid valve is grossly normal.  11. The aortic valve is tricuspid. Aortic valve regurgitation is not  visualized. No evidence of aortic valve sclerosis or stenosis.  12. The pulmonic valve was grossly normal. Pulmonic valve regurgitation is  not visualized.  13. Mildly elevated pulmonary artery systolic pressure.  14. The tricuspid regurgitant velocity is 2.87 m/s, and with an assumed  right atrial pressure of 3 mmHg, the estimated right ventricular systolic  pressure is mildly elevated at 35.9 mmHg.  15. Changes from prior study are noted.  16. EF has improved to 40-45%.   Echocardiogram 06/02/2019 IMPRESSIONS   1. Left ventricular ejection fraction, by visual estimation, is 30 to 35%. The left  ventricle has severely decreased function. Left ventricular septal wall thickness was moderately increased. There is no left ventricular hypertrophy. 2. Entire lateral wall, entire anterior wall, and apex are abnormal. 3. Elevated mean left atrial pressure. 4. Left ventricular diastolic Doppler parameters are consistent with pseudonormalization pattern of LV diastolic filling. 5. Global right ventricle has mildly reduced systolic function.The right ventricular size is severely enlarged. No increase in right ventricular wall thickness. 6. Left atrial size was moderately dilated. 7. Right atrial size was moderately dilated. 8. Mild mitral annular calcification. 9. The mitral valve is normal in structure. Trace mitral valve regurgitation. 10. The  tricuspid valve is normal in structure. Tricuspid valve regurgitation is trivial. 11. The aortic valve is tricuspid Aortic valve regurgitation was not visualized by color flow Doppler. Mild aortic valve sclerosis without stenosis. 12. The pulmonic valve was not well visualized. Pulmonic valve regurgitation is not visualized by color flow Doppler. 13. Moderately elevated pulmonary artery systolic pressure. 14. The inferior vena cava is dilated in size with <50% respiratory variability, suggesting right atrial pressure of 15 mmHg.  Cardiac MRI 06/05/2019 IMPRESSION: 1. Mildly dilated LV with mild LV hypertrophy. EF calculated 16% but looks in the 25% range visually (difficult study due to motion artifact). Septal bounce with diffuse hypokinesis, suspect RV pressure/volume overload.  2. Moderately dilated RV with severely decreased systolic function, EF 25% (visually more moderate dysfunction).  3. No myocardial LGE so no definitive evidence for prior MI, myocarditis, or infiltrative disease.  Past Medical History:  Diagnosis Date  . 2019 novel coronavirus disease (COVID-19) 07/2019  . COPD (chronic obstructive pulmonary disease) (HCC)   . HFrEF (heart failure with reduced ejection fraction) (HCC)   . Nonischemic cardiomyopathy (HCC)   . OSA (obstructive sleep apnea)   . Pulmonary hypertension (HCC)    WHO group II   Past Surgical History:  Procedure Laterality Date  . ADENOIDECTOMY    . RIGHT/LEFT HEART CATH AND CORONARY ANGIOGRAPHY N/A 06/05/2019   Procedure: RIGHT/LEFT HEART CATH AND CORONARY ANGIOGRAPHY;  Surgeon: Corky Crafts, MD;  Location: [facility] INVASIVE CV LAB;  Service: Cardiovascular;  Laterality: N/A;     No outpatient medications have been marked as taking for the 07/02/20 encounter (Appointment) with Ronney Asters, NP.     Allergies:   Patient has no known allergies.   Social History   Tobacco Use  . Smoking status: Former Smoker    Packs/day: 1.00     Years: 15.00    Pack years: 15.00    Types: Cigarettes    Quit date: 05/31/1989    Years since quitting: 31.1  . Smokeless tobacco: Never Used  Vaping Use  . Vaping Use: Never used  Substance Use Topics  . Alcohol use: Not Currently  . Drug use: Never     Family Hx: The patient's family history includes Diabetes in his father and sister; Heart attack in his father; Heart disease in his sister; Kidney disease in his mother.  ROS:   Please see the history of present illness.     All other systems reviewed and are negative.   Labs/Other Tests and Data Reviewed:    Recent Labs: 05/16/2020: BNP 10.0; BUN 24; Creatinine, Ser 1.15; Potassium 4.5; Sodium 140   Recent Lipid Panel Lab Results  Component Value Date/Time   CHOL 154 06/06/2019 04:57 AM   TRIG 76 06/06/2019 04:57 AM   HDL 34 (L) 06/06/2019 04:57 AM   CHOLHDL 4.5 06/06/2019 04:57 AM   LDLCALC 105 (  H) 06/06/2019 04:57 AM    Wt Readings from Last 3 Encounters:  03/20/20 (!) 322 lb (146.1 kg)  01/24/20 (!) 313 lb 3.2 oz (142.1 kg)  12/19/19 (!) 308 lb (139.7 kg)     Exam:    Vital Signs:  There were no vitals taken for this visit.   Well nourished, well developed male in no  acute distress.   ASSESSMENT & PLAN:    1.  Chronic systolic congestive heart failure-no increased work of breathing today.Continues using 2Lof oxygen.Weight today is327.He hascontinues to bephysical activitydoing activities with his grandson. Echocardiogram 09/2019 showed improvement in his EF to 40-45%. Continuetorsemide  Continue spironolactone 25 mg tablet daily ContinueEntresto 49-51 twice daily Contniuecarvedilol to 6.25 mg twice daily ContinueDaily weights Heart healthy low-sodium diet-salty 6 given Continue fluid restriction64ounces daily  Acute on chronic cor pulmonale/HF-using oxygen at night 2 L.  Underwent CPAP titration.  Reports compliance with CPAP.  Continue weight loss Heart healthy low-sodium  diet  Paroxysmal atrial tachycardia-heart rate today 57.Has not noticed any recent episodes of palpitations. Continuecarvedilol   Morbid obesity-weight today 327pounds Heart healthy low-sodium diet Continue weight loss-calorie counting information given Increase physical activity as tolerated  Disposition: Follow-up with [name] or me in 3 months.  COVID-19 Education: The signs and symptoms of COVID-19 were discussed with the patient and how to seek care for testing (follow up with PCP or arrange E-visit).  The importance of social distancing was discussed today.  Patient Risk:   After full review of this patients clinical status, I feel that they are at least moderate risk at this time.  Time:   Today, I have spent 6 minutes with the patient with telehealth technology discussing I spent greater than 20 minutes reviewing the patient's past test, past medical history, and previous notes.   Medication Adjustments/Labs and Tests Ordered: Current medicines are reviewed at length with the patient today.  Concerns regarding medicines are outlined above.   Tests Ordered: No orders of the defined types were placed in this encounter.  Medication Changes: No orders of the defined types were placed in this encounter.   Disposition:  in 3 month(s)  Signed, Thomasene Ripple. Kynzli Rease NP-C    04/10/2019 11:58 AM    [facility] [address] Office [phone] Fax [phone]

## 2020-07-02 ENCOUNTER — Telehealth (INDEPENDENT_AMBULATORY_CARE_PROVIDER_SITE_OTHER): Payer: Medicaid Other | Admitting: General Practice

## 2020-07-02 ENCOUNTER — Encounter: Payer: Self-pay | Admitting: General Practice

## 2020-07-02 VITALS — BP 105/59 | HR 97 | Wt 327.0 lb

## 2020-07-02 DIAGNOSIS — I50812 Chronic right heart failure: Secondary | ICD-10-CM | POA: Diagnosis not present

## 2020-07-02 DIAGNOSIS — I2781 Cor pulmonale (chronic): Secondary | ICD-10-CM

## 2020-07-02 DIAGNOSIS — I5022 Chronic systolic (congestive) heart failure: Secondary | ICD-10-CM | POA: Diagnosis not present

## 2020-07-02 NOTE — Patient Instructions (Signed)
Medication Instructions:  The current medical regimen is effective;  continue present plan and medications as directed. Please refer to the Current Medication list given to you today. *If you need a refill on your cardiac medications before your next appointment, please call your pharmacy*  Lab Work:   Testing/Procedures:  NONE    NONE  Special Instructions CONTINUE DAILY WEIGHTS  PLEASE INCREASE PHYSICAL ACTIVITY AS TOLERATED  PLEASE TRY TO LOOSE WEIGHT AND DO A FOOD LOG-SEE ATTACHED.  Follow-Up: Your next appointment:  3 month(s) In Person with Justin Fair, MD -OR- Justin CLEAVER, FNP-C  At [facility], you and your health needs are our priority.  As part of our continuing mission to provide you with exceptional heart care, we have created designated Provider Care Teams.  These Care Teams include your primary Cardiologist (physician) and Advanced Practice Providers (APPs -  Physician Assistants and Nurse Practitioners) who all work together to provide you with the care you need, when you need it.       Calorie Counting for Weight Loss Calories are units of energy. Your body needs a certain amount of calories from food to keep you going throughout the day. When you eat more calories than your body needs, your body stores the extra calories as fat. When you eat fewer calories than your body needs, your body burns fat to get the energy it needs. Calorie counting means keeping track of how many calories you eat and drink each day. Calorie counting can be helpful if you need to lose weight. If you make sure to eat fewer calories than your body needs, you should lose weight. Ask your health care provider what a healthy weight is for you. For calorie counting to work, you will need to eat the right number of calories in a day in order to lose a healthy amount of weight per week. A dietitian can help you determine how many calories you need in a day and will give you suggestions on how to  reach your calorie goal.  A healthy amount of weight to lose per week is usually 1-2 lb (0.5-0.9 kg). This usually means that your daily calorie intake should be reduced by 500-750 calories.  Eating 1,200 - 1,500 calories per day can help most women lose weight.  Eating 1,500 - 1,800 calories per day can help most men lose weight.  What do I need to know about calorie counting? In order to meet your daily calorie goal, you will need to:  Find out how many calories are in each food you would like to eat. Try to do this before you eat.  Decide how much of the food you plan to eat.  Write down what you ate and how many calories it had. Doing this is called keeping a food log. To successfully lose weight, it is important to balance calorie counting with a healthy lifestyle that includes regular activity. Aim for 150 minutes of moderate exercise (such as walking) or 75 minutes of vigorous exercise (such as running) each week. Where do I find calorie information?  The number of calories in a food can be found on a Nutrition Facts label. If a food does not have a Nutrition Facts label, try to look up the calories online or ask your dietitian for help. Remember that calories are listed per serving. If you choose to have more than one serving of a food, you will have to multiply the calories per serving by the amount of servings  you plan to eat. For example, the label on a package of bread might say that a serving size is 1 slice and that there are 90 calories in a serving. If you eat 1 slice, you will have eaten 90 calories. If you eat 2 slices, you will have eaten 180 calories. How do I keep a food log? Immediately after each meal, record the following information in your food log:  What you ate. Don't forget to include toppings, sauces, and other extras on the food.  How much you ate. This can be measured in cups, ounces, or number of items.  How many calories each food and drink had.  The  total number of calories in the meal. Keep your food log near you, such as in a small notebook in your pocket, or use a mobile app or website. Some programs will calculate calories for you and show you how many calories you have left for the day to meet your goal. What are some calorie counting tips?   Use your calories on foods and drinks that will fill you up and not leave you hungry: ? Some examples of foods that fill you up are nuts and nut butters, vegetables, lean proteins, and high-fiber foods like whole grains. High-fiber foods are foods with more than 5 g fiber per serving. ? Drinks such as sodas, specialty coffee drinks, alcohol, and juices have a lot of calories, yet do not fill you up.  Eat nutritious foods and avoid empty calories. Empty calories are calories you get from foods or beverages that do not have many vitamins or protein, such as candy, sweets, and soda. It is better to have a nutritious high-calorie food (such as an avocado) than a food with few nutrients (such as a bag of chips).  Know how many calories are in the foods you eat most often. This will help you calculate calorie counts faster.  Pay attention to calories in drinks. Low-calorie drinks include water and unsweetened drinks.  Pay attention to nutrition labels for "low fat" or "fat free" foods. These foods sometimes have the same amount of calories or more calories than the full fat versions. They also often have added sugar, starch, or salt, to make up for flavor that was removed with the fat.  Find a way of tracking calories that works for you. Get creative. Try different apps or programs if writing down calories does not work for you. What are some portion control tips?  Know how many calories are in a serving. This will help you know how many servings of a certain food you can have.  Use a measuring cup to measure serving sizes. You could also try weighing out portions on a kitchen scale. With time, you  will be able to estimate serving sizes for some foods.  Take some time to put servings of different foods on your favorite plates, bowls, and cups so you know what a serving looks like.  Try not to eat straight from a bag or box. Doing this can lead to overeating. Put the amount you would like to eat in a cup or on a plate to make sure you are eating the right portion.  Use smaller plates, glasses, and bowls to prevent overeating.  Try not to multitask (for example, watch TV or use your computer) while eating. If it is time to eat, sit down at a table and enjoy your food. This will help you to know when you are full. It  will also help you to be aware of what you are eating and how much you are eating. What are tips for following this plan? Reading food labels  Check the calorie count compared to the serving size. The serving size may be smaller than what you are used to eating.  Check the source of the calories. Make sure the food you are eating is high in vitamins and protein and low in saturated and trans fats. Shopping  Read nutrition labels while you shop. This will help you make healthy decisions before you decide to purchase your food.  Make a grocery list and stick to it. Cooking  Try to cook your favorite foods in a healthier way. For example, try baking instead of frying.  Use low-fat dairy products. Meal planning  Use more fruits and vegetables. Half of your plate should be fruits and vegetables.  Include lean proteins like poultry and fish. How do I count calories when eating out?  Ask for smaller portion sizes.  Consider sharing an entree and sides instead of getting your own entree.  If you get your own entree, eat only half. Ask for a box at the beginning of your meal and put the rest of your entree in it so you are not tempted to eat it.  If calories are listed on the menu, choose the lower calorie options.  Choose dishes that include vegetables, fruits, whole  grains, low-fat dairy products, and lean protein.  Choose items that are boiled, broiled, grilled, or steamed. Stay away from items that are buttered, battered, fried, or served with cream sauce. Items labeled "crispy" are usually fried, unless stated otherwise.  Choose water, low-fat milk, unsweetened iced tea, or other drinks without added sugar. If you want an alcoholic beverage, choose a lower calorie option such as a glass of wine or light beer.  Ask for dressings, sauces, and syrups on the side. These are usually high in calories, so you should limit the amount you eat.  If you want a salad, choose a garden salad and ask for grilled meats. Avoid extra toppings like bacon, cheese, or fried items. Ask for the dressing on the side, or ask for olive oil and vinegar or lemon to use as dressing.  Estimate how many servings of a food you are given. For example, a serving of cooked rice is  cup or about the size of half a baseball. Knowing serving sizes will help you be aware of how much food you are eating at restaurants. The list below tells you how big or small some common portion sizes are based on everyday objects: ? 1 oz--4 stacked dice. ? 3 oz--1 deck of cards. ? 1 tsp--1 die. ? 1 Tbsp-- a ping-pong ball. ? 2 Tbsp--1 ping-pong ball. ?  cup-- baseball. ? 1 cup--1 baseball. Summary  Calorie counting means keeping track of how many calories you eat and drink each day. If you eat fewer calories than your body needs, you should lose weight.  A healthy amount of weight to lose per week is usually 1-2 lb (0.5-0.9 kg). This usually means reducing your daily calorie intake by 500-750 calories.  The number of calories in a food can be found on a Nutrition Facts label. If a food does not have a Nutrition Facts label, try to look up the calories online or ask your dietitian for help.  Use your calories on foods and drinks that will fill you up, and not on foods and drinks that  will leave you  hungry.  Use smaller plates, glasses, and bowls to prevent overeating. This information is not intended to replace advice given to you by your health care provider. Make sure you discuss any questions you have with your health care provider. Document Revised: 05/12/2018 Document Reviewed: 07/23/2016 Elsevier Patient Education  2020 [employer].

## 2020-08-18 ENCOUNTER — Other Ambulatory Visit: Payer: Self-pay | Admitting: Cardiovascular Disease

## 2020-09-18 ENCOUNTER — Other Ambulatory Visit: Payer: Self-pay | Admitting: Cardiovascular Disease

## 2020-10-08 ENCOUNTER — Telehealth: Payer: Self-pay | Admitting: Cardiovascular Disease

## 2020-10-08 ENCOUNTER — Ambulatory Visit: Payer: Medicaid Other | Admitting: Cardiovascular Disease

## 2020-10-08 NOTE — Telephone Encounter (Signed)
    COVID-19 Pre-Screening Questions V2.:  . In the past 7 to 10 days have you had a cough,  shortness of breath, headache, congestion, fever (100 or greater) body aches, chills, sore throat, or sudden loss of taste or sense of smell,?  FYI--Patient's wife is calling to cancel patient's appointment for today, 10/08/20. She states the patient tested positive for COVID on 09/25/20 and he is still currently experiencing mild sinus related symptoms.  His appointment has been rescheduled for  01/01/21 with Dr. Sallyanne Kuster. . Have you been around anyone with known Covid 19, or who is waiting for a Covid test result and is symptomatic?   For patients who are Covid+, pending results or exposed in the last 5 days WITH SYMPTOMS:  1.       Offer to change appointment to a [place] appointment. If the patient declines, contact covering nurse or triage so it can be determined if patient needs to be seen or     rescheduled. (assumes patient calls ahead)  2.       If a patient presents in the lobby, ask them to return to their car and the nurse will call them. Obtain the correct cell phone number and message the covering nurse. The nurse will triage the patient and discuss with the provider to determine appropriate visit plan (In office, MyChart or reschedule).   3.       If it is determined the patient needs to be seen in the office, arrangements will be made for the patient to be seen in a remote room with minimal touches. (Location will be         specific to each [facility] site).               If you have any concerns/questions about symptoms patients report during screening (either on the phone or at threshold),                Send a secure chat with the patient's chart to the APP doing preop clearances for that day.              If the decision is made to see the patient, document the following in the appt. note:  "cleared by APP's initials".                        If an APP is not available contact a  member of the leadership team.   Patients who are Covid+ are allowed in the office when the following are met: 1. No symptoms or improving symptoms  2. At least 5 days post positive test

## 2020-10-08 NOTE — Telephone Encounter (Signed)
Will forward to Dr Croitoru as FYI 

## 2020-10-17 ENCOUNTER — Other Ambulatory Visit: Payer: Self-pay | Admitting: General Practice

## 2020-12-01 ENCOUNTER — Telehealth: Payer: Self-pay | Admitting: Cardiovascular Disease

## 2020-12-01 NOTE — Telephone Encounter (Signed)
**Note De-Identified Tyquavious Gamel Obfuscation** I did a Entresto PA through covermymeds and received the following message: Ed Mandich Key: [id number] - PA Case ID: [id number] Outcome: Approved today PA Case: [id number], Status: Approved Coverage Starts on: 12/01/2020 12:00:00 AM, Coverage Ends on: 12/01/2021 12:00:00 AM. Drug: [name] 97-103MG  tablets Form: [employer] [facility] Electronic PA Form [phone] NCPDP)  I have notified the pts wife (DPR) Clydie Braun and [facility] of this approval.

## 2020-12-01 NOTE — Telephone Encounter (Signed)
Pt c/o medication issue:  1. Name of Medication:   sacubitril-valsartan (ENTRESTO) 97-103 MG     2. How are you currently taking this medication (dosage and times per day)? As prescribed   3. Are you having a reaction (difficulty breathing--STAT)? No   4. What is your medication issue?   Patient's wife states the patient's insurance needs a prior authorization from [name] in order to cover this medication. Please assist.

## 2020-12-06 ENCOUNTER — Other Ambulatory Visit: Payer: Self-pay | Admitting: Critical Care Medicine

## 2021-01-01 ENCOUNTER — Ambulatory Visit (INDEPENDENT_AMBULATORY_CARE_PROVIDER_SITE_OTHER): Payer: Medicaid Other | Admitting: Cardiovascular Disease

## 2021-01-01 ENCOUNTER — Other Ambulatory Visit: Payer: Self-pay

## 2021-01-01 VITALS — BP 105/58 | HR 61 | Ht 69.0 in | Wt 332.0 lb

## 2021-01-01 DIAGNOSIS — I5022 Chronic systolic (congestive) heart failure: Secondary | ICD-10-CM | POA: Diagnosis not present

## 2021-01-01 DIAGNOSIS — I2781 Cor pulmonale (chronic): Secondary | ICD-10-CM

## 2021-01-01 DIAGNOSIS — E662 Morbid (severe) obesity with alveolar hypoventilation: Secondary | ICD-10-CM

## 2021-01-01 DIAGNOSIS — J9612 Chronic respiratory failure with hypercapnia: Secondary | ICD-10-CM

## 2021-01-01 DIAGNOSIS — J9611 Chronic respiratory failure with hypoxia: Secondary | ICD-10-CM

## 2021-01-01 DIAGNOSIS — I2721 Secondary pulmonary arterial hypertension: Secondary | ICD-10-CM | POA: Diagnosis not present

## 2021-01-01 DIAGNOSIS — I471 Supraventricular tachycardia: Secondary | ICD-10-CM

## 2021-01-01 DIAGNOSIS — G4733 Obstructive sleep apnea (adult) (pediatric): Secondary | ICD-10-CM

## 2021-01-01 MED ORDER — DAPAGLIFLOZIN PROPANEDIOL 10 MG PO TABS: 10.0000 mg | ORAL_TABLET | Freq: Every day | ORAL | 11 refills | Status: DC

## 2021-01-01 NOTE — Patient Instructions (Signed)
Medication Instructions:  START Farxiga 10 mg once daily  *If you need a refill on your cardiac medications before your next appointment, please call your pharmacy*   Lab Work: None ordered If you have labs (blood work) drawn today and your tests are completely normal, you will receive your results only by: Marland Kitchen MyChart Message (if you have MyChart) OR . A paper copy in the mail If you have any lab test that is abnormal or we need to change your treatment, we will call you to review the results.   Testing/Procedures: Your physician has requested that you have an echocardiogram in December. Echocardiography is a painless test that uses sound waves to create images of your heart. It provides your doctor with information about the size and shape of your heart and how well your heart's chambers and valves are working. You may receive an ultrasound enhancing agent through an IV if needed to better visualize your heart during the echo.This procedure takes approximately one hour. There are no restrictions for this procedure. This will take place at the [address].    Follow-Up: At [facility], you and your health needs are our priority.  As part of our continuing mission to provide you with exceptional heart care, we have created designated Provider Care Teams.  These Care Teams include your primary Cardiologist (physician) and Advanced Practice Providers (APPs -  Physician Assistants and Nurse Practitioners) who all work together to provide you with the care you need, when you need it.  We recommend signing up for the patient portal called "MyChart".  Sign up information is provided on this After Visit Summary.  MyChart is used to connect with patients for Virtual Visits (Telemedicine).  Patients are able to view lab/test results, encounter notes, upcoming appointments, etc.  Non-urgent messages can be sent to your provider as well.   To learn more about what you can do with  MyChart, go to [email or web address].    Your next appointment:   Follow up in January 2023 after the echo has been completed.

## 2021-01-01 NOTE — Progress Notes (Signed)
Cardiology Office Note:    Date:  01/04/2021   ID:  Justin Rasmussen, DOB July 29, 1956, MRN [id number]  PCP:  [name], PA  Cardiologist:  [name], MD  Electrophysiologist:  None   Referring MD: [name]*   Chief Complaint  Patient presents with  . Congestive Heart Failure    History of Present Illness:    Justin Rasmussen is a 65 y.o. male with a hx of heart failure with reduced ejection fraction, nonischemic cardiomyopathy, on a background of chronic cor pulmonale, chronic respiratory failure on oxygen, morbid obesity and chronic elevation of the right hemidiaphragm.  He initially presented in September 2020 with massive weight gain and edema as well as shortness of breath at rest.  His ejection fraction was 25% by MRI and 30-35% by echocardiography, without evidence of late gadolinium enhancement on MRI.  Cardiac catheterization showed mild coronary atherosclerosis with no stenoses greater than 10%.  He had pulmonary hypertension of mixed etiology with a systolic PA pressure of 44 mmHg and a wedge pressure of 29 mmHg.  After initiation of treatment with Entresto and carvedilol EF has improved to 40-45% and his most recent echocardiogram showed findings consistent with normal left heart filling pressures.  Repeat PA pressure remains mildly elevated with systolic estimated at 36 mmHg.  He is doing well and for the most part describes functional class II exertional dyspnea.  He has not had dizziness or syncope or palpitations.  He does not have orthopnea or PND or lower extremity edema.  He has never had chest pain.  He is not have claudication or focal neurological complaints.  He has gained another 10 pounds since his last appointment, although he does not have overt physical findings to suggest volume overload.  He reports compliance with CPAP.  Denies daytime hypersomnolence.  We have probably reached the limit of our ability to titrate carvedilol since  both his blood pressure and his heart rate are borderline low.  He is on maximum dose Entresto.  He has a history of prediabetes.  Past Medical History:  Diagnosis Date  . 2019 novel coronavirus disease (COVID-19) 07/2019  . COPD (chronic obstructive pulmonary disease) (HCC)   . HFrEF (heart failure with reduced ejection fraction) (HCC)   . Nonischemic cardiomyopathy (HCC)   . OSA (obstructive sleep apnea)   . Pulmonary hypertension (HCC)    WHO group II    Past Surgical History:  Procedure Laterality Date  . ADENOIDECTOMY    . RIGHT/LEFT HEART CATH AND CORONARY ANGIOGRAPHY N/A 06/05/2019   Procedure: RIGHT/LEFT HEART CATH AND CORONARY ANGIOGRAPHY;  Surgeon: Corky Crafts, MD;  Location: [facility];  Service: Cardiovascular;  Laterality: N/A;    Current Medications: Current Meds  Medication Sig  . aspirin 81 MG chewable tablet Chew 1 tablet (81 mg total) by mouth daily.  . carvedilol (COREG) 6.25 MG tablet TAKE 1 TABLET BY MOUTH TWICE DAILY WITH A MEAL  . cetirizine (ZYRTEC) 10 MG tablet Take 10 mg by mouth daily as needed for allergies or rhinitis.  . dapagliflozin propanediol (FARXIGA) 10 MG TABS tablet Take 1 tablet (10 mg total) by mouth daily before breakfast.  . fluticasone (FLONASE) 50 MCG/ACT nasal spray Place 2 sprays into both nostrils daily. (Patient taking differently: Place 2 sprays into both nostrils in the morning and at bedtime.)  . sacubitril-valsartan (ENTRESTO) 97-103 MG Take 1 tablet by mouth 2 (two) times daily.  Marland Kitchen spironolactone (ALDACTONE) 25 MG tablet Take 1  tablet by mouth once daily  . torsemide (DEMADEX) 20 MG tablet Take 1 tablet (20 mg total) by mouth 2 (two) times daily.     Allergies:   Patient has no known allergies.   Social History   Socioeconomic History  . Marital status: Married    Spouse name: Not on file  . Number of children: Not on file  . Years of education: Not on file  . Highest education level: Not on file   Occupational History  . Not on file  Tobacco Use  . Smoking status: Former Smoker    Packs/day: 1.00    Years: 15.00    Pack years: 15.00    Types: Cigarettes    Quit date: 05/31/1989    Years since quitting: 31.6  . Smokeless tobacco: Never Used  Vaping Use  . Vaping Use: Never used  Substance and Sexual Activity  . Alcohol use: Not Currently  . Drug use: Never  . Sexual activity: Not on file  Other Topics Concern  . Not on file  Social History Narrative  . Not on file   Social Determinants of Health   Financial Resource Strain: Not on file  Food Insecurity: Not on file  Transportation Needs: Not on file  Physical Activity: Not on file  Stress: Not on file  Social Connections: Not on file     Family History: The patient's family history includes Diabetes in his father and sister; Heart attack in his father; Heart disease in his sister; Kidney disease in his mother.  ROS:   Please see the history of present illness.   All other systems are reviewed and are negative.   EKGs/Labs/Other Studies Reviewed:    The following studies were reviewed today: Echocardiogram September 12, 2019 Sleep study and CPAP titration study.  EKG:  EKG is  ordered today.  It shows sinus rhythm with old right bundle branch block and normal QTC 434 ms.  No ischemic changes. Recent Labs: 05/16/2020: BNP 10.0; BUN 24; Creatinine, Ser 1.15; Potassium 4.5; Sodium 140  Recent Lipid Panel    Component Value Date/Time   CHOL 154 06/06/2019 0457   TRIG 76 06/06/2019 0457   HDL 34 (L) 06/06/2019 0457   CHOLHDL 4.5 06/06/2019 0457   VLDL 15 06/06/2019 0457   LDLCALC 105 (H) 06/06/2019 0457    Physical Exam:    VS:  BP (!) 105/58   Pulse 61   Ht 5\' 9"  (1.753 m)   Wt (!) 332 lb (150.6 kg)   BMI 49.03 kg/m     Wt Readings from Last 3 Encounters:  01/01/21 (!) 332 lb (150.6 kg)  07/02/20 (!) 327 lb (148.3 kg)  03/20/20 (!) 322 lb (146.1 kg)     General: Alert, oriented x3, no distress,  morbidly obese Head: no evidence of trauma, PERRL, EOMI, no exophtalmos or lid lag, no myxedema, no xanthelasma; normal ears, nose and oropharynx Neck: normal jugular venous pulsations and no hepatojugular reflux; brisk carotid pulses without delay and no carotid bruits Chest: clear to auscultation, no signs of consolidation by percussion or palpation, normal fremitus, symmetrical and full respiratory excursions Cardiovascular: normal position and quality of the apical impulse, regular rhythm, normal first and widely split second heart sounds, no murmurs, rubs or gallops Abdomen: no tenderness or distention, no masses by palpation, no abnormal pulsatility or arterial bruits, normal bowel sounds, no hepatosplenomegaly Extremities: no clubbing, cyanosis or edema; 2+ radial, ulnar and brachial pulses bilaterally; 2+ right femoral, posterior tibial  and dorsalis pedis pulses; 2+ left femoral, posterior tibial and dorsalis pedis pulses; no subclavian or femoral bruits Neurological: grossly nonfocal Psych: Normal mood and affect   ASSESSMENT:    1. Chronic systolic congestive heart failure (HCC)   2. Cor pulmonale (chronic) (HCC)   3. PAH (pulmonary artery hypertension) (HCC)   4. Chronic respiratory failure with hypoxia and hypercapnia (HCC)   5. OSA (obstructive sleep apnea)   6. Obesity hypoventilation syndrome (HCC)   7. Morbid obesity (HCC)   8. PAT (paroxysmal atrial tachycardia) (HCC)    PLAN:    In order of problems listed above:  1. Chronic systolic heart failure: Well compensated without overt hypervolemia (although his physical exam is impeded by his obesity).  He is on maximum dose Entresto and there is no room to increase the carvedilol or to add Aldactone.  We will start Farxiga 10 mg daily.  Time to reevaluate LV systolic function. 2. Chronic cor pulmonale/right heart failure:   Hard to see JVP but it does not appear distended and he does not have edema. 3. PAH: Combined etiology  due to both left heart failure and intrinsic arteriolar disease in the setting of chronic hypoxemia and sleep disordered breathing. 4. Chronic respiratory failure with hypoxia and hypercapnia: Wearing oxygen 2 L nasal cannula intermittently.  But does not use it at home. 5. OSA/obesity hypoventilation syndrome: He reports compliance with CPAP.  Denies daytime hypersomnolence. 6. Morbid obesity: This underlies the vast majority of his health problems and unfortunately he has gained more weight since his last appointment. 7. PAT: No clinically meaningful events have occurred recently.    Medication Adjustments/Labs and Tests Ordered: Current medicines are reviewed at length with the patient today.  Concerns regarding medicines are outlined above.  Orders Placed This Encounter  Procedures  . EKG 12-Lead  . ECHOCARDIOGRAM COMPLETE   Meds ordered this encounter  Medications  . dapagliflozin propanediol (FARXIGA) 10 MG TABS tablet    Sig: Take 1 tablet (10 mg total) by mouth daily before breakfast.    Dispense:  30 tablet    Refill:  11    Patient Instructions  Medication Instructions:  START Farxiga 10 mg once daily  *If you need a refill on your cardiac medications before your next appointment, please call your pharmacy*   Lab Work: None ordered If you have labs (blood work) drawn today and your tests are completely normal, you will receive your results only by: Marland Kitchen MyChart Message (if you have MyChart) OR . A paper copy in the mail If you have any lab test that is abnormal or we need to change your treatment, we will call you to review the results.   Testing/Procedures: Your physician has requested that you have an echocardiogram in December. Echocardiography is a painless test that uses sound waves to create images of your heart. It provides your doctor with information about the size and shape of your heart and how well your heart's chambers and valves are working. You may receive  an ultrasound enhancing agent through an IV if needed to better visualize your heart during the echo.This procedure takes approximately one hour. There are no restrictions for this procedure. This will take place at the [address].    Follow-Up: At [facility], you and your health needs are our priority.  As part of our continuing mission to provide you with exceptional heart care, we have created designated Provider Care Teams.  These Care Teams  include your primary Cardiologist (physician) and Advanced Practice Providers (APPs -  Physician Assistants and Nurse Practitioners) who all work together to provide you with the care you need, when you need it.  We recommend signing up for the patient portal called "MyChart".  Sign up information is provided on this After Visit Summary.  MyChart is used to connect with patients for Virtual Visits (Telemedicine).  Patients are able to view lab/test results, encounter notes, upcoming appointments, etc.  Non-urgent messages can be sent to your provider as well.   To learn more about what you can do with MyChart, go to [email or web address].    Your next appointment:   Follow up in January 2023 after the echo has been completed.      Signed, [name], MD  01/04/2021 6:04 PM    [facility]

## 2021-01-04 ENCOUNTER — Encounter: Payer: Self-pay | Admitting: Cardiovascular Disease

## 2021-01-05 ENCOUNTER — Other Ambulatory Visit: Payer: Self-pay | Admitting: Cardiovascular Disease

## 2021-01-05 ENCOUNTER — Other Ambulatory Visit: Payer: Self-pay | Admitting: General Practice

## 2021-01-05 ENCOUNTER — Telehealth: Payer: Self-pay | Admitting: *Deleted

## 2021-01-05 NOTE — Telephone Encounter (Signed)
Justin Rasmussen prior auth submitted through CoverMyMeds: [id number]   Status: Approved, Coverage Starts on: 01/05/2021 12:00:00 AM, Coverage Ends on: 01/05/2022 12:00:00 AM.

## 2021-03-05 MED ORDER — SACUBITRIL-VALSARTAN 97-103 MG PO TABS: 1.0000 | ORAL_TABLET | Freq: Two times a day (BID) | ORAL | 11 refills | Status: DC

## 2021-07-08 NOTE — Telephone Encounter (Signed)
Pt has switched insurance. PA was approved under pts previous insurance so pt is still unable to refill... we need to correct the insurance on the pa or do a new one if were not able to edit.  Callback # [phone]  Emelia Salisbury @ [employer]

## 2021-07-09 ENCOUNTER — Telehealth: Payer: Self-pay

## 2021-07-09 ENCOUNTER — Other Ambulatory Visit: Payer: Self-pay | Admitting: Cardiovascular Disease

## 2021-07-09 NOTE — Telephone Encounter (Signed)
**Note De-Identified Adilynn Bessey Obfuscation** I called [employer] and did the pts [place] PA over the phone with Johnny Bridge. Per Johnny Bridge the pts Marcelline Deist is approved from today until 07/04/2022. PA #: [id number]  Per Vita Barley PA form must be completed and faxed to them with notes showing improvement since the pt started taking Entresto.  I have printed the notes from the pts last office visit with Dr Royann Shivers on 01/01/21 showing improvement in the pts EF since he started taking [name], I printed the [place] PA form from [employer] site, completed it, and e-mailed all it to Dr [employer] nurse so she can obtain Dr [employer], date it and to fax it to [facility] at the fax number written on the cover letter included.

## 2021-07-10 ENCOUNTER — Other Ambulatory Visit: Payer: Self-pay | Admitting: *Deleted

## 2021-07-10 MED ORDER — SPIRONOLACTONE 25 MG PO TABS: 25.0000 mg | ORAL_TABLET | Freq: Every day | ORAL | 3 refills | Status: DC

## 2021-07-10 NOTE — Telephone Encounter (Signed)
Entresto prior Berkley Harvey has been faxed.

## 2021-07-16 NOTE — Telephone Encounter (Signed)
**Note De-Identified [name] Obfuscation** Per the pharmacist at [facility] the pts Justin Rasmussen went through successfully right after they called to tell us it was was not. She states that they are filling and the pt is aware.  She thanked me for our assistance.

## 2021-07-16 NOTE — Telephone Encounter (Signed)
[employer] called to speak with Larita Fife regarding this patient's PA. The pharmacy is concerned that the insurance we are using to get the PA is insurance the patient does not have any longer.  The pharmacy was hoping to get confirmation with Larita Fife.

## 2021-07-16 NOTE — Telephone Encounter (Signed)
**Note De-Identified Justin Rasmussen Obfuscation** Per Patty at [facility] the pts Justin Rasmussen was approved on 07/10/2021 and is valid until 07/05/2022. PA# [id number]  I have notified [employer] of this approval.

## 2021-09-21 ENCOUNTER — Ambulatory Visit (HOSPITAL_COMMUNITY): Payer: Medicaid Other | Attending: Cardiology

## 2021-09-21 ENCOUNTER — Other Ambulatory Visit: Payer: Self-pay

## 2021-09-21 DIAGNOSIS — I5022 Chronic systolic (congestive) heart failure: Secondary | ICD-10-CM | POA: Diagnosis present

## 2021-09-21 LAB — ECHOCARDIOGRAM COMPLETE
AR max vel: 3.52 cm2
AV Area VTI: 3.19 cm2
AV Area mean vel: 3.31 cm2
AV Mean grad: 8.3 mmHg
AV Peak grad: 16.2 mmHg
Ao pk vel: 2.01 m/s
Area-P 1/2: 3.23 cm2
S' Lateral: 3.45 cm

## 2021-11-10 ENCOUNTER — Encounter: Payer: Self-pay | Admitting: Cardiovascular Disease

## 2021-11-10 ENCOUNTER — Ambulatory Visit (INDEPENDENT_AMBULATORY_CARE_PROVIDER_SITE_OTHER): Payer: Medicaid Other | Admitting: Cardiovascular Disease

## 2021-11-10 ENCOUNTER — Other Ambulatory Visit: Payer: Self-pay

## 2021-11-10 VITALS — BP 110/60 | HR 59 | Ht 69.0 in | Wt 314.0 lb

## 2021-11-10 DIAGNOSIS — J9612 Chronic respiratory failure with hypercapnia: Secondary | ICD-10-CM

## 2021-11-10 DIAGNOSIS — G4733 Obstructive sleep apnea (adult) (pediatric): Secondary | ICD-10-CM

## 2021-11-10 DIAGNOSIS — I5032 Chronic diastolic (congestive) heart failure: Secondary | ICD-10-CM | POA: Diagnosis not present

## 2021-11-10 DIAGNOSIS — I2781 Cor pulmonale (chronic): Secondary | ICD-10-CM | POA: Diagnosis not present

## 2021-11-10 DIAGNOSIS — I471 Supraventricular tachycardia: Secondary | ICD-10-CM | POA: Diagnosis not present

## 2021-11-10 DIAGNOSIS — I50812 Chronic right heart failure: Secondary | ICD-10-CM | POA: Diagnosis not present

## 2021-11-10 DIAGNOSIS — J9611 Chronic respiratory failure with hypoxia: Secondary | ICD-10-CM

## 2021-11-10 DIAGNOSIS — I2721 Secondary pulmonary arterial hypertension: Secondary | ICD-10-CM

## 2021-11-10 DIAGNOSIS — R7303 Prediabetes: Secondary | ICD-10-CM

## 2021-11-10 DIAGNOSIS — E662 Morbid (severe) obesity with alveolar hypoventilation: Secondary | ICD-10-CM

## 2021-11-10 LAB — COMPREHENSIVE METABOLIC PANEL
ALT: 20 IU/L (ref 0–44)
AST: 22 IU/L (ref 0–40)
Albumin/Globulin Ratio: 1.9 (ref 1.2–2.2)
Albumin: 4.1 g/dL (ref 3.8–4.8)
Alkaline Phosphatase: 61 IU/L (ref 44–121)
BUN/Creatinine Ratio: 17 (ref 10–24)
BUN: 18 mg/dL (ref 8–27)
Bilirubin Total: 0.3 mg/dL (ref 0.0–1.2)
CO2: 34 mmol/L — ABNORMAL HIGH (ref 20–29)
Calcium: 9.1 mg/dL (ref 8.6–10.2)
Chloride: 99 mmol/L (ref 96–106)
Creatinine, Ser: 1.09 mg/dL (ref 0.76–1.27)
Globulin, Total: 2.2 g/dL (ref 1.5–4.5)
Glucose: 103 mg/dL — ABNORMAL HIGH (ref 70–99)
Potassium: 4.4 mmol/L (ref 3.5–5.2)
Sodium: 141 mmol/L (ref 134–144)
Total Protein: 6.3 g/dL (ref 6.0–8.5)
eGFR: 76 mL/min/{1.73_m2} (ref >59)

## 2021-11-10 LAB — MAGNESIUM: Magnesium: 2.2 mg/dL (ref 1.6–2.3)

## 2021-11-10 LAB — LIPID PANEL
Chol/HDL Ratio: 5.1 ratio — ABNORMAL HIGH (ref 0.0–5.0)
Cholesterol, Total: 193 mg/dL (ref 100–199)
HDL: 38 mg/dL — ABNORMAL LOW (ref >39)
LDL Chol Calc (NIH): 134 mg/dL — ABNORMAL HIGH (ref 0–99)
Triglycerides: 118 mg/dL (ref 0–149)
VLDL Cholesterol Cal: 21 mg/dL (ref 5–40)

## 2021-11-10 LAB — HEMOGLOBIN A1C
Est. average glucose Bld gHb Est-mCnc: 123 mg/dL
Hgb A1c MFr Bld: 5.9 % — ABNORMAL HIGH (ref 4.8–5.6)

## 2021-11-10 NOTE — Progress Notes (Signed)
Cardiology Office Note:    Date:  11/10/2021   ID:  Justin Rasmussen, DOB [birth year], MRN [id number]  PCP:  [name], PA  Cardiologist:  [name], MD  Electrophysiologist:  None   Referring MD: [name]*   Chief Complaint  Patient presents with   Congestive Heart Failure         History of Present Illness:    Justin Rasmussen is a 66 y.o. male with a hx of heart failure with reduced ejection fraction, nonischemic cardiomyopathy, on a background of chronic cor pulmonale, chronic respiratory failure on oxygen, morbid obesity and chronic elevation of the right hemidiaphragm.  Follow-up echocardiogram performed in January shows complete normalization of left ventricular systolic function, although the left ventricle remains mildly dilated and there is still mild pulmonary hypertension.  He initially presented in September 2020 with massive weight gain and edema as well as shortness of breath at rest.  His ejection fraction was 25% by MRI and 30-35% by echocardiography, without evidence of late gadolinium enhancement on MRI.  Cardiac catheterization showed mild coronary atherosclerosis with no stenoses greater than 10%.  He had pulmonary hypertension of mixed etiology with a systolic PA pressure of 44 mmHg and a wedge pressure of 29 mmHg.  After initiation of treatment with Entresto and carvedilol EF improved to 40-45% and his most recent echocardiogram in January 2023 showed normalization of left ventricular systolic function with EF of 60%, although the left ventricle remains mildly dilated and there is still very mild pulmonary hypertension with an estimated systolic PA pressure of 34 mmHg.  He has been remarkably well since his last appointment without any episodes of heart failure exacerbation.  He denies dizziness, palpitations, syncope, orthopnea, PND, lower extremity edema or chest pain at rest or with activity.  He reports 100% compliance with CPAP and  denies daytime hypersomnolence.  He has not required diuretic dose titration.  He denies problems with groin yeast infections or urinary tract infections.  Has NYHA functional class II exertional dyspnea (short of breath walking around the block) that does not interfere with daily living activities.  He has lost almost 20 pounds since his last appointment.  He is recovering from a cough that was bothering for the last couple of weeks, may be a cold or seasonal allergies.  Denies fever, chills, shortness of breath.  He is on spironolactone, Farxiga, maximum dose Entresto and maximum tolerated dose of carvedilol.  His blood pressure and heart rate are relatively low, which precludes additional beta-blocker titration.  He has a chronic right bundle branch block.  Past Medical History:  Diagnosis Date   2019 novel coronavirus disease (COVID-19) 07/2019   COPD (chronic obstructive pulmonary disease) (HCC)    HFrEF (heart failure with reduced ejection fraction) (HCC)    Nonischemic cardiomyopathy (HCC)    OSA (obstructive sleep apnea)    Pulmonary hypertension (HCC)    WHO group II    Past Surgical History:  Procedure Laterality Date   ADENOIDECTOMY     RIGHT/LEFT HEART CATH AND CORONARY ANGIOGRAPHY N/A 06/05/2019   Procedure: RIGHT/LEFT HEART CATH AND CORONARY ANGIOGRAPHY;  Surgeon: Corky Crafts, MD;  Location: [facility];  Service: Cardiovascular;  Laterality: N/A;    Current Medications: Current Meds  Medication Sig   aspirin 81 MG chewable tablet Chew 1 tablet (81 mg total) by mouth daily.   carvedilol (COREG) 6.25 MG tablet TAKE 1 TABLET BY MOUTH TWICE DAILY WITH A MEAL  cetirizine (ZYRTEC) 10 MG tablet Take 10 mg by mouth daily as needed for allergies or rhinitis.   dapagliflozin propanediol (FARXIGA) 10 MG TABS tablet Take 1 tablet (10 mg total) by mouth daily before breakfast.   fluticasone (FLONASE) 50 MCG/ACT nasal spray Place 2 sprays into both nostrils daily.  (Patient taking differently: Place 2 sprays into both nostrils in the morning and at bedtime.)   sacubitril-valsartan (ENTRESTO) 97-103 MG Take 1 tablet by mouth 2 (two) times daily.   spironolactone (ALDACTONE) 25 MG tablet Take 1 tablet by mouth once daily   spironolactone (ALDACTONE) 25 MG tablet Take 1 tablet (25 mg total) by mouth daily.   torsemide (DEMADEX) 20 MG tablet Take 1 tablet by mouth twice daily     Allergies:   Patient has no known allergies.   Social History   Socioeconomic History   Marital status: Married    Spouse name: Not on file   Number of children: Not on file   Years of education: Not on file   Highest education level: Not on file  Occupational History   Not on file  Tobacco Use   Smoking status: Former    Packs/day: 1.00    Years: 15.00    Pack years: 15.00    Types: Cigarettes    Quit date: 05/31/1989    Years since quitting: 32.4   Smokeless tobacco: Never  Vaping Use   Vaping Use: Never used  Substance and Sexual Activity   Alcohol use: Not Currently   Drug use: Never   Sexual activity: Not on file  Other Topics Concern   Not on file  Social History Narrative   Not on file   Social Determinants of Health   Financial Resource Strain: Not on file  Food Insecurity: Not on file  Transportation Needs: Not on file  Physical Activity: Not on file  Stress: Not on file  Social Connections: Not on file     Family History: The patient's family history includes Diabetes in his father and sister; Heart attack in his father; Heart disease in his sister; Kidney disease in his mother.  ROS:   Please see the history of present illness.   All other systems are reviewed and are negative.   EKGs/Labs/Other Studies Reviewed:    The following studies were reviewed today: Echocardiogram September 11, 2021  1. Left ventricular ejection fraction, by estimation, is 60 to 65%. The left ventricle has normal function. The left ventricle has no regional wall  motion abnormalities. The left ventricular internal cavity size was mildly dilated. Left ventricular diastolic parameters were normal.   2. Right ventricular systolic function is mildly reduced. The right  ventricular size is mildly enlarged. There is normal pulmonary artery  systolic pressure. The estimated right ventricular systolic pressure is 33.9 mmHg.   3. The mitral valve is normal in structure. No evidence of mitral valve regurgitation. No evidence of mitral stenosis.   4. The aortic valve is tricuspid. Aortic valve regurgitation is not  visualized. Aortic valve sclerosis/calcification is present, without any evidence of aortic stenosis.   5. The inferior vena cava is normal in size with greater than 50%  respiratory variability, suggesting right atrial pressure of 3 mmHg.    EKG:  EKG is  ordered today.  Personally reviewed, shows borderline sinus bradycardia with chronic right bundle branch block, no ischemic repolarization abnormalities, QTc 427 ms. Recent Labs: No results found for requested labs within last 8760 hours.  Recent Lipid Panel  Component Value Date/Time   CHOL 154 06/06/2019 0457   TRIG 76 06/06/2019 0457   HDL 34 (L) 06/06/2019 0457   CHOLHDL 4.5 06/06/2019 0457   VLDL 15 06/06/2019 0457   LDLCALC 105 (H) 06/06/2019 0457    Physical Exam:    VS:  BP 110/60    Pulse (!) 59    Ht 5\' 9"  (1.753 m)    Wt (!) 314 lb (142.4 kg)    SpO2 91%    BMI 46.37 kg/m     Wt Readings from Last 3 Encounters:  11/10/21 (!) 314 lb (142.4 kg)  01/01/21 (!) 332 lb (150.6 kg)  07/02/20 (!) 327 lb (148.3 kg)      General: Alert, oriented x3, no distress,  Head: no evidence of trauma, PERRL, EOMI, no exophtalmos or lid lag, no myxedema, no xanthelasma; normal ears, nose and oropharynx Neck: normal jugular venous pulsations and no hepatojugular reflux; brisk carotid pulses without delay and no carotid bruits Chest: clear to auscultation, no signs of consolidation by  percussion or palpation, normal fremitus, symmetrical and full respiratory excursions Cardiovascular: normal position and quality of the apical impulse, regular rhythm, normal first and widely split second heart sounds, no murmurs, rubs or gallops Abdomen: no tenderness or distention, no masses by palpation, no abnormal pulsatility or arterial bruits, normal bowel sounds, no hepatosplenomegaly Extremities: no clubbing, cyanosis or edema; 2+ radial, ulnar and brachial pulses bilaterally; 2+ right femoral, posterior tibial and dorsalis pedis pulses; 2+ left femoral, posterior tibial and dorsalis pedis pulses; no subclavian or femoral bruits Neurological: grossly nonfocal Psych: Normal mood and affect    ASSESSMENT:    1. Class 2 congestive heart failure, chronic, diastolic (HCC)   2. Cor pulmonale (chronic) (HCC)   3. Chronic right heart failure (HCC)   4. PAH (pulmonary artery hypertension) (HCC)   5. Chronic respiratory failure with hypoxia and hypercapnia (HCC)   6. OSA (obstructive sleep apnea)   7. Obesity hypoventilation syndrome (HCC)   8. Morbid obesity (HCC)   9. Paroxysmal atrial tachycardia (HCC)   10. Prediabetes     PLAN:    In order of problems listed above:  Chronic heart failure: Recovered left ventricular systolic function.  Well compensated (NYHA functional class II), clinically euvolemic.  He is on maximum dose Entresto and there is no room to increase the carvedilol.  On spironolactone and [place].  We will continue the same medications. Chronic cor pulmonale/right heart failure:   No overt evidence of fluid overload.  Obesity does limit the accuracy of this assessment. PAH: Mild by most recent echocardiogram.  Combined etiology due to both left heart failure and intrinsic arteriolar disease in the setting of chronic hypoxemia and sleep disordered breathing. Chronic respiratory failure with hypoxia and hypercapnia: Only uses oxygen 2 L by nasal cannula  intermittently. OSA/obesity hypoventilation syndrome: Reports 100% compliance with CPAP and denies daytime hypersomnolence. Morbid obesity: Has lost 20 pounds, but remains in the morbidly obese range.  Encouraged to keep on trying to lose weight. PAT: No clinically detected events recently. Prediabetes: Check hemoglobin A1c and lipid profile labs today    Medication Adjustments/Labs and Tests Ordered: Current medicines are reviewed at length with the patient today.  Concerns regarding medicines are outlined above.  Orders Placed This Encounter  Procedures   Comprehensive metabolic panel   Lipid panel   Magnesium   Hemoglobin A1c   EKG 12-Lead   No orders of the defined types were placed in this encounter.  Patient Instructions  Medication Instructions:  No changes *If you need a refill on your cardiac medications before your next appointment, please call your pharmacy*   Lab Work: Your provider would like for you to have the following labs today: lipids, CMET, Magnesium, A1C  If you have labs (blood work) drawn today and your tests are completely normal, you will receive your results only by: MyChart Message (if you have MyChart) OR A paper copy in the mail If you have any lab test that is abnormal or we need to change your treatment, we will call you to review the results.   Testing/Procedures: None ordered   Follow-Up: At [facility], you and your health needs are our priority.  As part of our continuing mission to provide you with exceptional heart care, we have created designated Provider Care Teams.  These Care Teams include your primary Cardiologist (physician) and Advanced Practice Providers (APPs -  Physician Assistants and Nurse Practitioners) who all work together to provide you with the care you need, when you need it.  We recommend signing up for the patient portal called "MyChart".  Sign up information is provided on this After Visit Summary.  MyChart is  used to connect with patients for Virtual Visits (Telemedicine).  Patients are able to view lab/test results, encounter notes, upcoming appointments, etc.  Non-urgent messages can be sent to your provider as well.   To learn more about what you can do with MyChart, go to [email or web address].    Your next appointment:   12 month(s)  The format for your next appointment:   In Person  Provider:   [name], MD {    Signed, [name], MD  11/10/2021 10:00 AM    [facility]

## 2021-11-10 NOTE — Patient Instructions (Signed)
Medication Instructions:  ?No changes ?*If you need a refill on your cardiac medications before your next appointment, please call your pharmacy* ? ? ?Lab Work: ?Your provider would like for you to have the following labs today: lipids, CMET, Magnesium, A1C ? ?If you have labs (blood work) drawn today and your tests are completely normal, you will receive your results only by: ?MyChart Message (if you have MyChart) OR ?A paper copy in the mail ?If you have any lab test that is abnormal or we need to change your treatment, we will call you to review the results. ? ? ?Testing/Procedures: ?None ordered ? ? ?Follow-Up: ?At [facility], you and your health needs are our priority.  As part of our continuing mission to provide you with exceptional heart care, we have created designated Provider Care Teams.  These Care Teams include your primary Cardiologist (physician) and Advanced Practice Providers (APPs -  Physician Assistants and Nurse Practitioners) who all work together to provide you with the care you need, when you need it. ? ?We recommend signing up for the patient portal called "MyChart".  Sign up information is provided on this After Visit Summary.  MyChart is used to connect with patients for Virtual Visits (Telemedicine).  Patients are able to view lab/test results, encounter notes, upcoming appointments, etc.  Non-urgent messages can be sent to your provider as well.   ?To learn more about what you can do with MyChart, go to [email or web address].   ? ?Your next appointment:   ?12 month(s) ? ?The format for your next appointment:   ?In Person ? ?Provider:   ?[name], MD { ? ?

## 2021-11-17 ENCOUNTER — Encounter: Payer: Self-pay | Admitting: *Deleted

## 2021-11-17 DIAGNOSIS — E785 Hyperlipidemia, unspecified: Secondary | ICD-10-CM

## 2021-11-17 MED ORDER — ROSUVASTATIN CALCIUM 10 MG PO TABS: 10.0000 mg | ORAL_TABLET | Freq: Every day | ORAL | 3 refills | Status: DC

## 2022-01-04 ENCOUNTER — Other Ambulatory Visit: Payer: Self-pay | Admitting: Cardiovascular Disease

## 2022-01-04 ENCOUNTER — Other Ambulatory Visit: Payer: Self-pay | Admitting: General Practice

## 2022-01-19 ENCOUNTER — Other Ambulatory Visit: Payer: Self-pay | Admitting: Cardiovascular Disease

## 2022-03-15 ENCOUNTER — Other Ambulatory Visit: Payer: Self-pay | Admitting: Cardiovascular Disease

## 2022-04-13 ENCOUNTER — Other Ambulatory Visit: Payer: Self-pay | Admitting: Cardiovascular Disease

## 2022-06-15 ENCOUNTER — Encounter: Payer: Self-pay | Admitting: Cardiovascular Disease

## 2022-06-15 NOTE — Telephone Encounter (Signed)
Unfortunately there is no good alternative to [place] (all the equivalent drugs are also brand-name and expensive). I am glad he can get at least a month of the [facility].  I would apply for patient assistance with that medication as well, since the criteria may be more liberal.  If he does not qualify and we have to stop the Entresto, I would replace it with valsartan 320 mg once daily.

## 2022-06-16 ENCOUNTER — Other Ambulatory Visit: Payer: Self-pay

## 2022-06-28 ENCOUNTER — Other Ambulatory Visit: Payer: Self-pay | Admitting: *Deleted

## 2022-06-28 MED ORDER — ENTRESTO 97-103 MG PO TABS: 1.0000 | ORAL_TABLET | Freq: Two times a day (BID) | ORAL | 3 refills | Status: DC

## 2022-06-28 NOTE — Progress Notes (Signed)
Entresto assistance faxed.  

## 2022-07-02 ENCOUNTER — Telehealth: Payer: Self-pay | Admitting: *Deleted

## 2022-07-02 NOTE — Telephone Encounter (Signed)
Provider portion of entresto assistance has been faxed. Patient's daughter stated they would fax their portion.

## 2022-07-13 NOTE — Telephone Encounter (Signed)
No substitute for [place]. Have we tried to apply for manufacturer assistance?

## 2022-07-13 NOTE — Telephone Encounter (Signed)
We will do the best we can. When he runs out of farxiga, we may need to increase the daily dose of torsemide. Will do the valsartan 320 mg daily instead of the Entresto. When he has Medicare, will start over.

## 2022-07-14 MED ORDER — VALSARTAN 320 MG PO TABS: 320.0000 mg | ORAL_TABLET | Freq: Every day | ORAL | 3 refills | Status: DC

## 2022-07-26 ENCOUNTER — Other Ambulatory Visit: Payer: Self-pay | Admitting: Cardiovascular Disease

## 2022-07-28 NOTE — Telephone Encounter (Signed)
Thanks - do we need to redo the prescription? Or just make sure paperwork is done?

## 2022-07-28 NOTE — Telephone Encounter (Signed)
Spoke to patient's wife she stated husband will have his old insurance starting back on 08/06/22.Stated he will continue Entresto 97/103 mg twice a day. He will not take Valsartan.Stated insurance is asking for a prior authorization.I will send message to our prior authorization nurse.

## 2022-08-11 ENCOUNTER — Telehealth: Payer: Self-pay

## 2022-08-11 NOTE — Telephone Encounter (Signed)
He should be on Farxiga 10 mg daily and Entresto 97/103 mg twice daily (stop the valsartan if he gets the [place]). Thanks

## 2022-08-11 NOTE — Telephone Encounter (Signed)
[employer] called back about PA, she hung up.

## 2022-08-11 NOTE — Telephone Encounter (Signed)
**Note De-Identified Cassius Cullinane Obfuscation** Ann, at [employer] s/w the PA dept and  they advised her that they did not call me back and then she s/w Sherron Flemings who helped me with the pts Entresto and [place] PAs and she stated that she did not call us back.  Per Dewayne Hatch we should be getting a determination letter Shakera Ebrahimi fax within 24 hours and that if they need anything they will contact us.

## 2022-08-11 NOTE — Telephone Encounter (Addendum)
**Note De-Identified [name] Obfuscation** I called [employer] at [phone] and started a [place] PA with [place]. Per [name] they will fax [place] a deternivation within 24 hours and if not receieved we can call them back in 24 hours for the determination. PA ID: [id number].

## 2022-08-11 NOTE — Telephone Encounter (Addendum)
**Note De-Identified Justin Rasmussen Obfuscation** I called [employer] at [phone] and started a Entresto 97-103 mg  PA with [name]. Per [name] they will fax [place] a deternivation within 24 hours and if not receieved we can call them back in 24 hours for the determination. PA ID: [id number].

## 2022-08-12 MED ORDER — SACUBITRIL-VALSARTAN 97-103 MG PO TABS: 1.0000 | ORAL_TABLET | Freq: Two times a day (BID) | ORAL | 3 refills | Status: DC

## 2022-08-12 NOTE — Telephone Encounter (Addendum)
**Note De-Identified Reagen Goates Obfuscation** Per [name] with [employer] Darrelyn Hillock PA has been approved until 02/04/2023. PA#: [id number]  I have removed Valsartan from the pts med list and then added and e-scribed Entresto 97-103 mg to [employer] to fill for #180 with 3 refills.  I then called [employer] to make them aware of this approval and was advised that the pt picked his Entresto RX up already and that his cost was $4/30 day supply.

## 2022-08-12 NOTE — Telephone Encounter (Signed)
**Note De-Identified Asenath Balash Obfuscation** Per [name] with [employer] the pts Farixga PA has been approved until 08/12/2023. BZ#:[id number]  I called [facility] pharmacy to make the aware of approval and was advised that the pt picked his Justin Rasmussen RX up today and that he paid $4/30 day supply.

## 2022-08-12 NOTE — Addendum Note (Signed)
**Note De-Identified Solmon Bohr Obfuscation** Addended by: Demetrios Loll on: 08/12/2022 03:07 PM   Modules accepted: Orders

## 2022-10-12 ENCOUNTER — Encounter: Payer: Self-pay | Admitting: Cardiovascular Disease

## 2022-10-24 ENCOUNTER — Other Ambulatory Visit: Payer: Self-pay | Admitting: Cardiovascular Disease

## 2022-10-25 ENCOUNTER — Encounter: Payer: Self-pay | Admitting: Cardiovascular Disease

## 2022-10-25 MED ORDER — ROSUVASTATIN CALCIUM 10 MG PO TABS: 10.0000 mg | ORAL_TABLET | Freq: Every day | ORAL | 1 refills | Status: DC

## 2023-01-08 ENCOUNTER — Other Ambulatory Visit: Payer: Self-pay | Admitting: Cardiovascular Disease

## 2023-03-16 DIAGNOSIS — G4733 Obstructive sleep apnea (adult) (pediatric): Secondary | ICD-10-CM | POA: Diagnosis not present

## 2023-03-28 ENCOUNTER — Ambulatory Visit: Payer: Medicare Other | Attending: Cardiovascular Disease | Admitting: Cardiovascular Disease

## 2023-03-28 ENCOUNTER — Encounter: Payer: Self-pay | Admitting: Cardiovascular Disease

## 2023-03-28 VITALS — BP 102/50 | HR 73 | Ht 69.0 in | Wt 309.0 lb

## 2023-03-28 DIAGNOSIS — I5021 Acute systolic (congestive) heart failure: Secondary | ICD-10-CM

## 2023-03-28 DIAGNOSIS — I2781 Cor pulmonale (chronic): Secondary | ICD-10-CM | POA: Diagnosis not present

## 2023-03-28 DIAGNOSIS — R7303 Prediabetes: Secondary | ICD-10-CM

## 2023-03-28 DIAGNOSIS — G4733 Obstructive sleep apnea (adult) (pediatric): Secondary | ICD-10-CM

## 2023-03-28 DIAGNOSIS — I5032 Chronic diastolic (congestive) heart failure: Secondary | ICD-10-CM

## 2023-03-28 DIAGNOSIS — I4510 Unspecified right bundle-branch block: Secondary | ICD-10-CM | POA: Diagnosis not present

## 2023-03-28 DIAGNOSIS — E662 Morbid (severe) obesity with alveolar hypoventilation: Secondary | ICD-10-CM

## 2023-03-28 DIAGNOSIS — I2721 Secondary pulmonary arterial hypertension: Secondary | ICD-10-CM | POA: Diagnosis not present

## 2023-03-28 DIAGNOSIS — J9612 Chronic respiratory failure with hypercapnia: Secondary | ICD-10-CM

## 2023-03-28 DIAGNOSIS — J9611 Chronic respiratory failure with hypoxia: Secondary | ICD-10-CM

## 2023-03-28 MED ORDER — DAPAGLIFLOZIN PROPANEDIOL 10 MG PO TABS: 10.0000 mg | ORAL_TABLET | Freq: Every day | ORAL | 0 refills | Status: DC

## 2023-03-28 NOTE — Patient Instructions (Signed)
Medication Instructions:  Your physician recommends that you continue on your current medications as directed. Please refer to the Current Medication list given to you today.  *If you need a refill on your cardiac medications before your next appointment, please call your pharmacy*  Follow-Up: At [facility], you and your health needs are our priority.  As part of our continuing mission to provide you with exceptional heart care, we have created designated Provider Care Teams.  These Care Teams include your primary Cardiologist (physician) and Advanced Practice Providers (APPs -  Physician Assistants and Nurse Practitioners) who all work together to provide you with the care you need, when you need it.  Your next appointment:   1 year(s)  Provider:   [name], MD

## 2023-03-28 NOTE — Progress Notes (Unsigned)
Cardiology Office Note:    Date:  03/29/2023   ID:  Justin Rasmussen, DOB Jun 25, 1956, MRN [id number]  PCP:  [name], PA  Cardiologist:  [name], MD  Electrophysiologist:  None   Referring MD: [name]*   Chief Complaint  Patient presents with   Congestive Heart Failure    History of Present Illness:    Justin Rasmussen is a 67 y.o. male with a hx of heart failure with reduced ejection fraction, nonischemic cardiomyopathy, on a background of chronic cor pulmonale, chronic respiratory failure on oxygen, morbid obesity and chronic elevation of the right hemidiaphragm.  Follow-up echocardiogram performed September 21, 2021 showed complete normalization of left ventricular systolic function, although the left ventricle remains mildly dilated and there is still mild pulmonary hypertension.  Justin Rasmussen is doing really well from a cardiovascular point of view.  NYHA functional class I.  He has no complaints of shortness of breath or chest pain at rest or with activity.  He has very rare orthostatic dizziness and has not experienced palpitations or syncope.  He denies lower extremity edema or intermittent claudication or any focal neurological events.  He has not had any falls.  His blood pressure is quite low at 102/50.  He reports compliance with CPAP and denies daytime hypersomnolence.  Although he is managed with some weight, he remains morbidly obese with a BMI of 45.  He is on comprehensive treatment for nonischemic cardiomyopathy with maximum dose Entresto, maximum tolerated dose of carvedilol, dapagliflozin and spironolactone.  He takes a very low-dose of loop diuretic (torsemide 20 mg once daily).  He has not required diuretic dose adjustment or hospitalization.  He has not had any problems with groin yeast infections urinary tract infections.  He initially presented in September 2020 with massive weight gain and edema as well as shortness of breath at rest.   His ejection fraction was 25% by MRI and 30-35% by echocardiography, without evidence of late gadolinium enhancement on MRI.  Cardiac catheterization showed mild coronary atherosclerosis with no stenoses greater than 10%.  He had pulmonary hypertension of mixed etiology with a systolic PA pressure of 44 mmHg and a wedge pressure of 29 mmHg.  After initiation of treatment with Entresto and carvedilol EF improved to 40-45% and his most recent echocardiogram in January 2023 showed normalization of left ventricular systolic function with EF of 60%, although the left ventricle remains mildly dilated and there is still very mild pulmonary hypertension with an estimated systolic PA pressure of 34 mmHg.  He has chronic right bundle branch block.  Past Medical History:  Diagnosis Date   2019 novel coronavirus disease (COVID-19) 07/2019   COPD (chronic obstructive pulmonary disease) (HCC)    HFrEF (heart failure with reduced ejection fraction) (HCC)    Nonischemic cardiomyopathy (HCC)    OSA (obstructive sleep apnea)    Pulmonary hypertension (HCC)    WHO group II    Past Surgical History:  Procedure Laterality Date   ADENOIDECTOMY     RIGHT/LEFT HEART CATH AND CORONARY ANGIOGRAPHY N/A 06/05/2019   Procedure: RIGHT/LEFT HEART CATH AND CORONARY ANGIOGRAPHY;  Surgeon: Corky Crafts, MD;  Location: [facility];  Service: Cardiovascular;  Laterality: N/A;    Current Medications: Current Meds  Medication Sig   aspirin 81 MG chewable tablet Chew 1 tablet (81 mg total) by mouth daily.   carvedilol (COREG) 6.25 MG tablet TAKE 1 TABLET BY MOUTH TWICE DAILY WITH A MEAL   cetirizine (ZYRTEC)  10 MG tablet Take 10 mg by mouth daily as needed for allergies or rhinitis.   rosuvastatin (CRESTOR) 10 MG tablet Take 1 tablet (10 mg total) by mouth daily.   sacubitril-valsartan (ENTRESTO) 97-103 MG Take 1 tablet by mouth 2 (two) times daily.   spironolactone (ALDACTONE) 25 MG tablet Take 1 tablet by mouth  once daily   torsemide (DEMADEX) 20 MG tablet Take 1 tablet by mouth twice daily   [DISCONTINUED] dapagliflozin propanediol (FARXIGA) 10 MG TABS tablet TAKE 1 TABLET BY MOUTH ONCE DAILY BEFORE BREAKFAST     Allergies:   Patient has no known allergies.   Social History   Socioeconomic History   Marital status: Married    Spouse name: Not on file   Number of children: Not on file   Years of education: Not on file   Highest education level: Not on file  Occupational History   Not on file  Tobacco Use   Smoking status: Former    Current packs/day: 0.00    Average packs/day: 1 pack/day for 15.0 years (15.0 ttl pk-yrs)    Types: Cigarettes    Start date: 05/31/1974    Quit date: 05/31/1989    Years since quitting: 33.8   Smokeless tobacco: Never  Vaping Use   Vaping status: Never Used  Substance and Sexual Activity   Alcohol use: Not Currently   Drug use: Never   Sexual activity: Not on file  Other Topics Concern   Not on file  Social History Narrative   Not on file   Social Determinants of Health   Financial Resource Strain: Not on file  Food Insecurity: Not on file  Transportation Needs: Not on file  Physical Activity: Unknown (06/01/2019)   Exercise Vital Sign    Days of Exercise per Week: Patient declined    Minutes of Exercise per Session: Patient declined  Stress: No Stress Concern Present (06/01/2019)   [employer] of Occupational Health - Occupational Stress Questionnaire    Feeling of Stress : Only a little  Social Connections: Unknown (01/19/2022)   Received from [facility]   Social Network    Social Network: Not on file     Family History: The patient's family history includes Diabetes in his father and sister; Heart attack in his father; Heart disease in his sister; Kidney disease in his mother.  ROS:   Please see the history of present illness.   All other systems are reviewed and are negative.   EKGs/Labs/Other Studies Reviewed:     The following studies were reviewed today: Echocardiogram September 11, 2021  1. Left ventricular ejection fraction, by estimation, is 60 to 65%. The left ventricle has normal function. The left ventricle has no regional wall motion abnormalities. The left ventricular internal cavity size was mildly dilated. Left ventricular diastolic parameters were normal.   2. Right ventricular systolic function is mildly reduced. The right  ventricular size is mildly enlarged. There is normal pulmonary artery  systolic pressure. The estimated right ventricular systolic pressure is 33.9 mmHg.   3. The mitral valve is normal in structure. No evidence of mitral valve regurgitation. No evidence of mitral stenosis.   4. The aortic valve is tricuspid. Aortic valve regurgitation is not  visualized. Aortic valve sclerosis/calcification is present, without any evidence of aortic stenosis.   5. The inferior vena cava is normal in size with greater than 50%  respiratory variability, suggesting right atrial pressure of 3 mmHg.    EKG:  EKG is  ordered today.  Personally reviewed the shows sinus rhythm with right bundle branch block and posterior fascicular block. Recent Labs: No results found for requested labs within last 365 days.  Recent Lipid Panel    Component Value Date/Time   CHOL 193 11/10/2021 1022   TRIG 118 11/10/2021 1022   HDL 38 (L) 11/10/2021 1022   CHOLHDL 5.1 (H) 11/10/2021 1022   CHOLHDL 4.5 06/06/2019 0457   VLDL 15 06/06/2019 0457   LDLCALC 134 (H) 11/10/2021 1022    Physical Exam:    VS:  BP (!) 102/50 (BP Location: Left Arm, Patient Position: Sitting, Cuff Size: Large)   Pulse 73   Ht 5\' 9"  (1.753 m)   Wt (!) 309 lb (140.2 kg)   SpO2 91%   BMI 45.63 kg/m     Wt Readings from Last 3 Encounters:  03/28/23 (!) 309 lb (140.2 kg)  11/10/21 (!) 314 lb (142.4 kg)  01/01/21 (!) 332 lb (150.6 kg)     General: Alert, oriented x3, no distress, morbidly obese Head: no evidence of  trauma, PERRL, EOMI, no exophtalmos or lid lag, no myxedema, no xanthelasma; normal ears, nose and oropharynx Neck: normal jugular venous pulsations and no hepatojugular reflux; brisk carotid pulses without delay and no carotid bruits Chest: clear to auscultation, no signs of consolidation by percussion or palpation, normal fremitus, symmetrical and full respiratory excursions Cardiovascular: normal position and quality of the apical impulse, regular rhythm, normal first and widely split second heart sounds, no murmurs, rubs or gallops Abdomen: no tenderness or distention, no masses by palpation, no abnormal pulsatility or arterial bruits, normal bowel sounds, no hepatosplenomegaly Extremities: no clubbing, cyanosis or edema; 2+ radial, ulnar and brachial pulses bilaterally; 2+ right femoral, posterior tibial and dorsalis pedis pulses; 2+ left femoral, posterior tibial and dorsalis pedis pulses; no subclavian or femoral bruits Neurological: grossly nonfocal Psych: Normal mood and affect    ASSESSMENT:    1. Chronic diastolic congestive heart failure (HCC)   2. RBBB   3. Cor pulmonale (chronic) (HCC)   4. PAH (pulmonary artery hypertension) (HCC)   5. Chronic respiratory failure with hypoxia and hypercapnia (HCC)   6. OSA (obstructive sleep apnea)   7. Obesity hypoventilation syndrome (HCC)   8. Morbid obesity (HCC)   9. Prediabetes     PLAN:    In order of problems listed above:  Chronic heart failure: Recovered left ventricular systolic function.  Euvolemic, NYHA functional class I on all 4 major pillars of treatment of cardiomyopathy and a very low-dose of loop diuretic.  Although his blood pressure is relatively low, he has no symptoms of hypotension. Chronic cor pulmonale/right heart failure:   Currently with any evidence of edema or other signs of hypervolemia. PAH: Asymptomatic.  Mild by most recent echocardiogram.  Combined etiology due to both left heart failure and intrinsic  arteriolar disease in the setting of chronic hypoxemia and sleep disordered breathing. Chronic respiratory failure with hypoxia and hypercapnia: Only uses oxygen 2 L by nasal cannula intermittently. OSA/obesity hypoventilation syndrome: Denies daytime hypersomnolence.  Reports 100% compliance with CPAP while asleep. Morbid obesity: He has lost about 20 pounds since his maximum weight at presentation 2021 but remains morbidly obese range with a BMI of 45.  Encourage additional weight loss. PAT: Denies palpitations. Prediabetes: Most send hemoglobin A1c was 5.9%.  He is due for repeat hemoglobin A1c and lipid profile.  Plans to have these done with his PCP.    Medication  Adjustments/Labs and Tests Ordered: Current medicines are reviewed at length with the patient today.  Concerns regarding medicines are outlined above.  Orders Placed This Encounter  Procedures   EKG 12-Lead   Meds ordered this encounter  Medications   dapagliflozin propanediol (FARXIGA) 10 MG TABS tablet    Sig: Take 1 tablet (10 mg total) by mouth daily before breakfast.    Dispense:  90 tablet    Refill:  0    Patient Instructions  Medication Instructions:  Your physician recommends that you continue on your current medications as directed. Please refer to the Current Medication list given to you today.  *If you need a refill on your cardiac medications before your next appointment, please call your pharmacy*  Follow-Up: At [facility], you and your health needs are our priority.  As part of our continuing mission to provide you with exceptional heart care, we have created designated Provider Care Teams.  These Care Teams include your primary Cardiologist (physician) and Advanced Practice Providers (APPs -  Physician Assistants and Nurse Practitioners) who all work together to provide you with the care you need, when you need it.  Your next appointment:   1 year(s)  Provider:   [name], MD        Signed, [name], MD  03/29/2023 9:14 PM    [facility]

## 2023-03-29 ENCOUNTER — Encounter: Payer: Self-pay | Admitting: Cardiovascular Disease

## 2023-04-09 ENCOUNTER — Other Ambulatory Visit: Payer: Self-pay | Admitting: Cardiovascular Disease

## 2023-04-21 ENCOUNTER — Ambulatory Visit (INDEPENDENT_AMBULATORY_CARE_PROVIDER_SITE_OTHER): Payer: Medicare Other | Admitting: Podiatry

## 2023-04-21 ENCOUNTER — Ambulatory Visit: Payer: Self-pay | Admitting: Podiatry

## 2023-04-21 ENCOUNTER — Encounter: Payer: Self-pay | Admitting: Podiatry

## 2023-04-21 DIAGNOSIS — L97512 Non-pressure chronic ulcer of other part of right foot with fat layer exposed: Secondary | ICD-10-CM

## 2023-04-21 NOTE — Progress Notes (Signed)
Subjective:  Patient ID: Justin Rasmussen, male    DOB: [birth year],   MRN: [id number]  No chief complaint on file.   67 y.o. male presents for concern of a lesion on the bottom of his foot. Here with wife who relates it started out as a black spot on the bottom of his foot several weeks ago. Relates then he stepped on his grandchilds toy and the area started bleeding and opened up. They have been keeping neosporin and a bandaid on it. He is not diabetic . Denies any other pedal complaints. Denies n/v/f/c.   Past Medical History:  Diagnosis Date   2019 novel coronavirus disease (COVID-19) 07/2019   COPD (chronic obstructive pulmonary disease) (HCC)    HFrEF (heart failure with reduced ejection fraction) (HCC)    Nonischemic cardiomyopathy (HCC)    OSA (obstructive sleep apnea)    Pulmonary hypertension (HCC)    WHO group II    Objective:  Physical Exam: Vascular: DP/PT pulses 2/4 bilateral. CFT <3 seconds. Normal hair growth on digits. No edema.  Skin. No lacerations or abrasions bilateral feet. Plantar ulceration noted with granular base about 1 cm x 1.5 cm x 0.2 cm with surrounding darkening possible dirt vs bruising vs duskiness of the skin.  Musculoskeletal: MMT 5/5 bilateral lower extremities in DF, PF, Inversion and Eversion. Deceased ROM in DF of ankle joint.  Neurological: Sensation intact to light touch.   Assessment:   1. Skin ulcer of plantar aspect of right foot with fat layer exposed (HCC)      Plan:  Patient was evaluated and treated and all questions answered. Ulcer plantar right foot with fat layer exposed  -Debridement as below. -Dressed with betadine, DSD. -Off-loading with surgical shoe. -No abx indicated.  -ABIs to rule out vascular issue -Discussed glucose control and proper protein-rich diet.  -Discussed if any worsening redness, pain, fever or chills to call or may need to report to the emergency room. Patient expressed understanding.   Procedure:  Excisional Debridement of Wound Rationale: Removal of non-viable soft tissue from the wound to promote healing.  Anesthesia: none Pre-Debridement Wound Measurements: overlying skin slough   Post-Debridement Wound Measurements: 1 cm x 1.5 cm x 0.2 cm  Type of Debridement: Sharp Excisional Tissue Removed: Non-viable soft tissue Depth of Debridement: subcutaneous tissue. Technique: Sharp excisional debridement to bleeding, viable wound base.  Dressing: Dry, sterile, compression dressing. Disposition: Patient tolerated procedure well. Patient to return in 2 week for follow-up.  Return in about 2 weeks (around 05/05/2023) for wound check.   Louann Sjogren, DPM

## 2023-05-03 ENCOUNTER — Encounter (HOSPITAL_COMMUNITY): Payer: Medicare Other

## 2023-05-05 ENCOUNTER — Ambulatory Visit (HOSPITAL_COMMUNITY)
Admission: RE | Admit: 2023-05-05 | Discharge: 2023-05-05 | Disposition: A | Discharge status: Home / Self Care | Payer: Medicare Other | Source: Ambulatory Visit | Attending: Podiatry | Admitting: Podiatry

## 2023-05-05 DIAGNOSIS — L97512 Non-pressure chronic ulcer of other part of right foot with fat layer exposed: Secondary | ICD-10-CM | POA: Diagnosis not present

## 2023-05-05 LAB — VAS US ABI WITH/WO TBI
Left ABI: 1.16
Right ABI: 1.11

## 2023-05-08 DIAGNOSIS — G4733 Obstructive sleep apnea (adult) (pediatric): Secondary | ICD-10-CM | POA: Diagnosis not present

## 2023-05-12 ENCOUNTER — Encounter: Payer: Self-pay | Admitting: Podiatry

## 2023-05-12 ENCOUNTER — Ambulatory Visit (INDEPENDENT_AMBULATORY_CARE_PROVIDER_SITE_OTHER): Payer: Medicare Other | Admitting: Podiatry

## 2023-05-12 DIAGNOSIS — L97512 Non-pressure chronic ulcer of other part of right foot with fat layer exposed: Secondary | ICD-10-CM

## 2023-05-12 NOTE — Progress Notes (Signed)
Subjective:  Patient ID: Justin Rasmussen, male    DOB: [birth year],   MRN: [id number]  Chief Complaint  Patient presents with   Wound Check    Pt presents for wound check pt stated that he is better but still a little bit sore.    67 y.o. male presents for follow-up of right foot wound. Relates he has been dressing as instructed and still getting pain in the foot. Marland Kitchen He is not diabetic . Denies any other pedal complaints. Denies n/v/f/c.   Past Medical History:  Diagnosis Date   2019 novel coronavirus disease (COVID-19) 07/2019   COPD (chronic obstructive pulmonary disease) (HCC)    HFrEF (heart failure with reduced ejection fraction) (HCC)    Nonischemic cardiomyopathy (HCC)    OSA (obstructive sleep apnea)    Pulmonary hypertension (HCC)    WHO group II    Objective:  Physical Exam: Vascular: DP/PT pulses 2/4 bilateral. CFT <3 seconds. Normal hair growth on digits. No edema.  Skin. No lacerations or abrasions bilateral feet. Plantar ulceration noted with granular base about 1 cm x 1. cm x 0.2 cm with surrounding darkening possible dirt vs bruising vs duskiness of the skin appears to be more bruising today.   Musculoskeletal: MMT 5/5 bilateral lower extremities in DF, PF, Inversion and Eversion. Deceased ROM in DF of ankle joint.  Neurological: Sensation intact to light touch.   Assessment:   1. Skin ulcer of plantar aspect of right foot with fat layer exposed (HCC)       Plan:  Patient was evaluated and treated and all questions answered. Ulcer plantar right foot with fat layer exposed  -Debridement as below. -Dressed with betadine, DSD. -Off-loading with surgical shoe. -No abx indicated.  -ABIs to rule out vascular issue -Discussed glucose control and proper protein-rich diet.  -Discussed if any worsening redness, pain, fever or chills to call or may need to report to the emergency room. Patient expressed understanding.   Procedure: Excisional Debridement of  Wound Rationale: Removal of non-viable soft tissue from the wound to promote healing.  Anesthesia: none Pre-Debridement Wound Measurements: overlying skin slough   Post-Debridement Wound Measurements: 1 cm x 1.0 cm x 0.2 cm  Type of Debridement: Sharp Excisional Tissue Removed: Non-viable soft tissue Depth of Debridement: subcutaneous tissue. Technique: Sharp excisional debridement to bleeding, viable wound base.  Dressing: Dry, sterile, compression dressing. Disposition: Patient tolerated procedure well. Patient to return in 2 week for follow-up.  Return in about 2 weeks (around 05/26/2023) for wound check.   Louann Sjogren, DPM

## 2023-05-26 ENCOUNTER — Ambulatory Visit (INDEPENDENT_AMBULATORY_CARE_PROVIDER_SITE_OTHER): Payer: Medicare Other | Admitting: Podiatry

## 2023-05-26 ENCOUNTER — Other Ambulatory Visit: Payer: Self-pay | Admitting: Podiatry

## 2023-05-26 ENCOUNTER — Encounter: Payer: Self-pay | Admitting: Podiatry

## 2023-05-26 DIAGNOSIS — L97512 Non-pressure chronic ulcer of other part of right foot with fat layer exposed: Secondary | ICD-10-CM

## 2023-05-26 DIAGNOSIS — L989 Disorder of the skin and subcutaneous tissue, unspecified: Secondary | ICD-10-CM

## 2023-05-26 DIAGNOSIS — C4371 Malignant melanoma of right lower limb, including hip: Secondary | ICD-10-CM | POA: Diagnosis not present

## 2023-05-26 NOTE — Addendum Note (Signed)
Addended by: Daryel November on: 05/26/2023 09:08 AM   Modules accepted: Orders

## 2023-05-26 NOTE — Progress Notes (Signed)
Subjective:  Patient ID: Justin Rasmussen, male    DOB: [birth year],   MRN: [id number]  No chief complaint on file.   67 y.o. male presents for follow-up of right foot wound. Relates he has been dressing as instructed and still getting pain in the foot. .Relates the darkened area on his foot has been there about a year and was there before stepping on the toy that opened up the ulcer.  He is not diabetic . Denies any other pedal complaints. Denies n/v/f/c.   Past Medical History:  Diagnosis Date   2019 novel coronavirus disease (COVID-19) 07/2019   COPD (chronic obstructive pulmonary disease) (HCC)    HFrEF (heart failure with reduced ejection fraction) (HCC)    Nonischemic cardiomyopathy (HCC)    OSA (obstructive sleep apnea)    Pulmonary hypertension (HCC)    WHO group II    Objective:  Physical Exam: Vascular: DP/PT pulses 2/4 bilateral. CFT <3 seconds. Normal hair growth on digits. No edema.  Skin. No lacerations or abrasions bilateral feet. Plantar ulceration noted with granular base about 1 cm x 1. cm x 0.2 cm with surrounding darkening skin discoloration that spreads about 5 cm x 10 cm on the plantar foot has had no improvement.  Musculoskeletal: MMT 5/5 bilateral lower extremities in DF, PF, Inversion and Eversion. Deceased ROM in DF of ankle joint.  Neurological: Sensation intact to light touch.   Summary:  Right: Resting right ankle-brachial index is within normal range. The  right toe-brachial index is normal.   Left: Resting left ankle-brachial index is within normal range. The left  toe-brachial index is normal.   Assessment:   No diagnosis found.     Plan:  Patient was evaluated and treated and all questions answered. Ulcer plantar right foot with fat layer exposed  -Debridement and biopsy as below.  -Discussed concern for little improvement and discoloration present on the bottom of foot so elected to go with shave biopsy of part of the lesion today.  -Dressed  with betadine, DSD. -Off-loading with surgical shoe. -No abx indicated.  -ABIs normal and Tbis normal range.  -Discussed glucose control and proper protein-rich diet.  -Discussed if any worsening redness, pain, fever or chills to call or may need to report to the emergency room. Patient expressed understanding.   Procedure: Shave biopsy of the granular wound and surrounding discolored skin.  Rationale: Removal of non-viable soft tissue from the wound to promote healing.  Anesthesia: none Pre-Debridement Wound Measurements: overlying skin slough   Post-Debridement Wound Measurements: 1 cm x 1.0 cm x 0.2 cm  Type of Debridement: Sharp Excisional Tissue Removed: Non-viable soft tissue Depth of Debridement: subcutaneous tissue. Technique: Sharp excisional debridement to bleeding, viable wound base. Blade used for shave biopsy of lesion.  Dressing: Dry, sterile, compression dressing. Disposition: Patient tolerated procedure well. Patient to return in 2 week for follow-up.  No follow-ups on file.   Louann Sjogren, DPM

## 2023-06-01 ENCOUNTER — Telehealth: Payer: Self-pay

## 2023-06-01 NOTE — Telephone Encounter (Signed)
It would be skin for pigmented lesion and for ulceration.

## 2023-06-01 NOTE — Telephone Encounter (Signed)
Debbie from [facility] called and left a message on the nurse line - they received the specimen from the shave biopsy, however the order reads wound culture - what test would you like done on this specimen? Please call to clarify [phone]

## 2023-06-02 ENCOUNTER — Telehealth: Payer: Self-pay | Admitting: Podiatry

## 2023-06-02 NOTE — Telephone Encounter (Signed)
Debbie from [facility] called to get clarification on a specimen that was sent to them.  Top of their form has [place] on it, so she wanted to make sure it was supposed to go to them.  Also, they received 4 pieces of tissue, but didn't know if a wound culture needed performed.  Reached out to [facility] and Dr. Ralene Cork, who were working together that day for clarification.  Dr. Ralene Cork responded, and stated she just wants the biopsy/pathology done on the sent specimen, but doesn't need a wound culture performed.    Called [facility] back and left voicemail for Debbie.   No further action needed.

## 2023-06-08 ENCOUNTER — Telehealth: Payer: Self-pay | Admitting: Podiatry

## 2023-06-08 ENCOUNTER — Other Ambulatory Visit: Payer: Self-pay | Admitting: Podiatry

## 2023-06-08 LAB — TISSUE SPECIMEN

## 2023-06-08 LAB — PATHOLOGY REPORT

## 2023-06-08 NOTE — Telephone Encounter (Signed)
[facility] called to confirm you got the results from pts sample.   I checked and they were in there and I told her we got them. Pt has appt to see Dr Ralene Cork Tomorrow. 10/3

## 2023-06-09 ENCOUNTER — Ambulatory Visit: Payer: Medicare Other | Admitting: Podiatry

## 2023-06-09 ENCOUNTER — Encounter: Payer: Self-pay | Admitting: Podiatry

## 2023-06-09 DIAGNOSIS — C4371 Malignant melanoma of right lower limb, including hip: Secondary | ICD-10-CM

## 2023-06-09 DIAGNOSIS — L97512 Non-pressure chronic ulcer of other part of right foot with fat layer exposed: Secondary | ICD-10-CM | POA: Diagnosis not present

## 2023-06-09 NOTE — Progress Notes (Signed)
Subjective:  Patient ID: Justin Rasmussen, male    DOB: [birth year],   MRN: [id number]  Chief Complaint  Patient presents with   Wound Check     Pt presents for wound check pt stated that he is better but still a little bit sore.       67 y.o. male presents for follow-up of right foot wound. Relates he has been dressing as instructed and still getting pain in the foot. Here to review results of pathology report.  .Relates darkening from a possible splinter he thought over a year ago but was prompted to come in for ulceration of the area after stepping on a toy.  He is not diabetic . Denies any other pedal complaints. Denies n/v/f/c.   Past Medical History:  Diagnosis Date   2019 novel coronavirus disease (COVID-19) 07/2019   COPD (chronic obstructive pulmonary disease) (HCC)    HFrEF (heart failure with reduced ejection fraction) (HCC)    Nonischemic cardiomyopathy (HCC)    OSA (obstructive sleep apnea)    Pulmonary hypertension (HCC)    WHO group II    Objective:  Physical Exam: Vascular: DP/PT pulses 2/4 bilateral. CFT <3 seconds. Normal hair growth on digits. No edema.  Skin. No lacerations or abrasions bilateral feet. Plantar ulceration noted with granular base about 1 cm x 1. cm x 0.2 cm with surrounding darkening skin discoloration that spreads about 5 cm x 5 cm on the plantar foot has had no improvement.  Musculoskeletal: MMT 5/5 bilateral lower extremities in DF, PF, Inversion and Eversion. Deceased ROM in DF of ankle joint.  Neurological: Sensation intact to light touch.   Summary:  Right: Resting right ankle-brachial index is within normal range. The  right toe-brachial index is normal.   Left: Resting left ankle-brachial index is within normal range. The left  toe-brachial index is normal.    A Gross Description  [facility]  Comment: The specimen is received in 10% neutral buffered formalin labeled with the patient's name, DOB, and "right foot" and  consists of multiple soft white to tan-red irregular fragments of tissue measuring 1.2 x 1.0 x 0.5 cm in aggregate.  The specimen is filtered and entirely submitted one cassette, labeled A1.   A Diagnosis  [facility]  Comment: Malignant melanoma, ulcerated, fragmented . Comment: Intact epidermis is not present for further evaluation and definite information for a synoptic report of this lesion.  It is uncertain in this specimen whether the melanoma is primary to this anatomic location or represents a metastatic melanoma. Additional biopsy with intact epidermis/dermis is recommended. Dr. Annabell Sabal also viewed this case with agreement. After review of the initial H&E slides, immunohistochemical stains which are deemed necessary for diagnostic purposes were performed in Block-A, with the following results: CKAE1/AE3(highlights few fragments of epithelium), Panmel(+ in lesional cells) and PRAME(+ in lesional cells).  Appropriate controls were used.    Assessment:   1. Malignant melanoma of right lower extremity including hip (HCC)   2. Skin ulcer of plantar aspect of right foot with fat layer exposed (HCC)        Plan:  Patient was evaluated and treated and all questions answered. Ulcer plantar right foot with fat layer exposed  -Reviewed results of skin biopsy with patient concern for malignant melanoma of the plantar foot. Did discuss this with his wife yesterday after results came in and reviewed again with him today.  -Referral was sent to melanoma clinic in [place].  -His  wife was able to get him an appointment with her dermatologist for Monday.  -Called to notify primary care of results and advice on referrals for possible oncology given possible metastasis from other region.  -Dressed with betadine, DSD. -Off-loading with surgical shoe. -No abx indicated.  -ABIs normal and Tbis normal range.  -Discussed if any worsening  redness, pain, fever or chills to call or may need to report to the emergency room. Patient expressed understanding.  At this time patient will need further care outside of our scope and will follow with dermatology or melanoma clinic.    No follow-ups on file.   Louann Sjogren, DPM

## 2023-06-13 DIAGNOSIS — C4371 Malignant melanoma of right lower limb, including hip: Secondary | ICD-10-CM | POA: Diagnosis not present

## 2023-06-13 DIAGNOSIS — D485 Neoplasm of uncertain behavior of skin: Secondary | ICD-10-CM | POA: Diagnosis not present

## 2023-06-14 DIAGNOSIS — G4733 Obstructive sleep apnea (adult) (pediatric): Secondary | ICD-10-CM | POA: Diagnosis not present

## 2023-06-16 DIAGNOSIS — G4733 Obstructive sleep apnea (adult) (pediatric): Secondary | ICD-10-CM | POA: Diagnosis not present

## 2023-06-20 ENCOUNTER — Other Ambulatory Visit: Payer: Self-pay | Admitting: Cardiovascular Disease

## 2023-06-24 DIAGNOSIS — C4371 Malignant melanoma of right lower limb, including hip: Secondary | ICD-10-CM | POA: Diagnosis not present

## 2023-06-24 DIAGNOSIS — C439 Malignant melanoma of skin, unspecified: Secondary | ICD-10-CM | POA: Diagnosis not present

## 2023-06-27 ENCOUNTER — Encounter: Payer: Self-pay | Admitting: Cardiovascular Disease

## 2023-06-27 DIAGNOSIS — C4371 Malignant melanoma of right lower limb, including hip: Secondary | ICD-10-CM | POA: Diagnosis not present

## 2023-06-29 DIAGNOSIS — Z87891 Personal history of nicotine dependence: Secondary | ICD-10-CM | POA: Diagnosis not present

## 2023-06-29 DIAGNOSIS — R7303 Prediabetes: Secondary | ICD-10-CM | POA: Diagnosis not present

## 2023-06-29 DIAGNOSIS — I428 Other cardiomyopathies: Secondary | ICD-10-CM | POA: Diagnosis not present

## 2023-06-29 DIAGNOSIS — E785 Hyperlipidemia, unspecified: Secondary | ICD-10-CM | POA: Diagnosis not present

## 2023-06-29 DIAGNOSIS — I509 Heart failure, unspecified: Secondary | ICD-10-CM | POA: Diagnosis not present

## 2023-06-29 DIAGNOSIS — Z01818 Encounter for other preprocedural examination: Secondary | ICD-10-CM | POA: Diagnosis not present

## 2023-06-29 DIAGNOSIS — C4371 Malignant melanoma of right lower limb, including hip: Secondary | ICD-10-CM | POA: Diagnosis not present

## 2023-06-29 DIAGNOSIS — Z7982 Long term (current) use of aspirin: Secondary | ICD-10-CM | POA: Diagnosis not present

## 2023-07-04 DIAGNOSIS — C774 Secondary and unspecified malignant neoplasm of inguinal and lower limb lymph nodes: Secondary | ICD-10-CM | POA: Diagnosis not present

## 2023-07-04 DIAGNOSIS — C4371 Malignant melanoma of right lower limb, including hip: Secondary | ICD-10-CM | POA: Diagnosis not present

## 2023-07-04 DIAGNOSIS — C439 Malignant melanoma of skin, unspecified: Secondary | ICD-10-CM | POA: Diagnosis not present

## 2023-07-06 NOTE — Telephone Encounter (Signed)
From past experience I know that Medicare has some pretty strict red tape surrounding electric scooters.  Amongst other things they require that the referral is made by the patient's primary care provider, not by specialists.  They also require evaluation by one of their assigned mobility specialists physicians before approving the device.  I would start the by going to Justin Rasmussen's PCP.

## 2023-07-12 ENCOUNTER — Encounter: Payer: Self-pay | Admitting: Family Medicine

## 2023-07-12 ENCOUNTER — Ambulatory Visit (INDEPENDENT_AMBULATORY_CARE_PROVIDER_SITE_OTHER): Payer: Medicare Other | Admitting: Family Medicine

## 2023-07-12 VITALS — BP 97/61 | HR 75 | Temp 98.1°F | Resp 16 | Ht 66.0 in | Wt 303.0 lb

## 2023-07-12 DIAGNOSIS — Z7689 Persons encountering health services in other specified circumstances: Secondary | ICD-10-CM | POA: Diagnosis not present

## 2023-07-12 DIAGNOSIS — C439 Malignant melanoma of skin, unspecified: Secondary | ICD-10-CM | POA: Diagnosis not present

## 2023-07-12 DIAGNOSIS — I429 Cardiomyopathy, unspecified: Secondary | ICD-10-CM | POA: Diagnosis not present

## 2023-07-12 DIAGNOSIS — G4733 Obstructive sleep apnea (adult) (pediatric): Secondary | ICD-10-CM

## 2023-07-12 DIAGNOSIS — Z23 Encounter for immunization: Secondary | ICD-10-CM

## 2023-07-12 DIAGNOSIS — C4371 Malignant melanoma of right lower limb, including hip: Secondary | ICD-10-CM | POA: Diagnosis not present

## 2023-07-12 DIAGNOSIS — Z9981 Dependence on supplemental oxygen: Secondary | ICD-10-CM

## 2023-07-12 DIAGNOSIS — R600 Localized edema: Secondary | ICD-10-CM | POA: Diagnosis not present

## 2023-07-12 NOTE — Progress Notes (Signed)
New Patient Office Visit  Subjective    Patient ID: Justin Rasmussen, male    DOB: October 18, 1955  Age: 67 y.o. MRN: [id number]  CC:  Chief Complaint  Patient presents with   Establish Care    Pt is transitioning to from [facility],refill pain meds, pt wants flu, pt needs a referral for electric  scooter    HPI Justin Rasmussen presents to establish care. Pt is new to me. Wife is my patient.   Pt was diagnosed with melanoma 3 weeks ago. He had first surgery done on 07/04/23. He has positive lymph node with cancer cells. He has 2nd surgery on 11/18.  He will be non-weight bearing for a few months. He has a kneerover that he uses. They are needing electric scooter rx to help insurance pay for it.  He would like flu vaccine and never had any issues with the vaccine.  Pt has cardiologist for CHF diagnosed in 2021. He has [facility]. He is managed through them on medication. He also has OSA and using CPAP with oxygen. He reports he uses oxygen when his oxygen saturations drops below 90. They keep the oxygen in the car when they leave the house. He has both tank and portable oxygen that he uses since CHF.     Outpatient Encounter Medications as of 07/12/2023  Medication Sig   aspirin 81 MG chewable tablet Chew 1 tablet (81 mg total) by mouth daily.   carvedilol (COREG) 6.25 MG tablet TAKE 1 TABLET BY MOUTH TWICE DAILY WITH A MEAL   cetirizine (ZYRTEC) 10 MG tablet Take 10 mg by mouth daily as needed for allergies or rhinitis.   FARXIGA 10 MG TABS tablet TAKE 1 TABLET BY MOUTH ONCE DAILY BEFORE BREAKFAST   rosuvastatin (CRESTOR) 10 MG tablet Take 1 tablet by mouth once daily   sacubitril-valsartan (ENTRESTO) 97-103 MG Take 1 tablet by mouth 2 (two) times daily.   spironolactone (ALDACTONE) 25 MG tablet Take 1 tablet by mouth once daily   torsemide (DEMADEX) 20 MG tablet Take 1 tablet by mouth twice daily   fluticasone (FLONASE) 50 MCG/ACT nasal spray Place 2 sprays into both nostrils  daily. (Patient not taking: Reported on 07/12/2023)   No facility-administered encounter medications on file as of 07/12/2023.    Past Medical History:  Diagnosis Date   2019 novel coronavirus disease (COVID-19) 07/2019   COPD (chronic obstructive pulmonary disease) (HCC)    HFrEF (heart failure with reduced ejection fraction) (HCC)    Nonischemic cardiomyopathy (HCC)    OSA (obstructive sleep apnea)    Pulmonary hypertension (HCC)    WHO group II    Past Surgical History:  Procedure Laterality Date   ADENOIDECTOMY     RIGHT/LEFT HEART CATH AND CORONARY ANGIOGRAPHY N/A 06/05/2019   Procedure: RIGHT/LEFT HEART CATH AND CORONARY ANGIOGRAPHY;  Surgeon: Corky Crafts, MD;  Location: [facility];  Service: Cardiovascular;  Laterality: N/A;    Family History  Problem Relation Age of Onset   Kidney disease Mother    Diabetes Father    Heart attack Father    Diabetes Sister    Heart disease Sister     Social History   Socioeconomic History   Marital status: Married    Spouse name: Not on file   Number of children: Not on file   Years of education: Not on file   Highest education level: Not on file  Occupational History   Not on file  Tobacco Use  Smoking status: Former    Current packs/day: 0.00    Average packs/day: 1 pack/day for 15.0 years (15.0 ttl pk-yrs)    Types: Cigarettes    Start date: 05/31/1974    Quit date: 05/31/1989    Years since quitting: 34.1   Smokeless tobacco: Never  Vaping Use   Vaping status: Never Used  Substance and Sexual Activity   Alcohol use: Not Currently   Drug use: Never   Sexual activity: Not on file  Other Topics Concern   Not on file  Social History Narrative   Not on file   Social Determinants of Health   Financial Resource Strain: Not on file  Food Insecurity: Not on file  Transportation Needs: Not on file  Physical Activity: Unknown (06/01/2019)   Exercise Vital Sign    Days of Exercise per Week: Patient  declined    Minutes of Exercise per Session: Patient declined  Stress: No Stress Concern Present (06/01/2019)   [employer] of Occupational Health - Occupational Stress Questionnaire    Feeling of Stress : Only a little  Social Connections: Unknown (01/19/2022)   Received from [facility], [facility]   Social Network    Social Network: Not on file  Intimate Partner Violence: Unknown (12/11/2021)   Received from [facility], [facility]   HITS    Physically Hurt: Not on file    Insult or Talk Down To: Not on file    Threaten Physical Harm: Not on file    Scream or Curse: Not on file    Review of Systems  Musculoskeletal:  Positive for joint pain.       Right foot pain        Objective    BP 97/61   Pulse 75   Temp 98.1 F (36.7 C) (Oral)   Resp 16   Ht 5\' 6"  (1.676 m)   Wt (!) 303 lb (137.4 kg) Comment: pt reported non weight bearing  SpO2 100%   BMI 48.91 kg/m   Physical Exam Vitals and nursing note reviewed.  Constitutional:      Appearance: Normal appearance. He is normal weight.  HENT:     Head: Normocephalic and atraumatic.     Right Ear: External ear normal.     Left Ear: External ear normal.     Nose: Nose normal.     Mouth/Throat:     Mouth: Mucous membranes are moist.     Pharynx: Oropharynx is clear.  Eyes:     Conjunctiva/sclera: Conjunctivae normal.     Pupils: Pupils are equal, round, and reactive to light.  Cardiovascular:     Rate and Rhythm: Normal rate.  Pulmonary:     Effort: Pulmonary effort is normal.  Abdominal:     General: Abdomen is flat. Bowel sounds are normal.  Skin:    General: Skin is warm.     Capillary Refill: Capillary refill takes less than 2 seconds.  Neurological:     General: No focal deficit present.     Mental Status: He is alert. Mental status is at baseline.     Gait: Gait abnormal.  Psychiatric:        Mood and Affect: Mood normal.        Behavior: Behavior normal.        Thought Content: Thought  content normal.        Judgment: Judgment normal.        Assessment & Plan:   Problem List Items Addressed  This Visit       Cardiovascular and Mediastinum   Cardiomyopathy- etiology not yet determined   Relevant Orders   DME Wheelchair electric     Respiratory   OSA (obstructive sleep apnea)   Relevant Orders   DME Wheelchair electric   Other Visit Diagnoses     Encounter to establish care with new doctor    -  Primary   Need for influenza vaccination       Relevant Orders   Flu Vaccine Trivalent High Dose (Fluad)   Dependence on supplemental oxygen when ambulating       Relevant Orders   DME Wheelchair electric   Acral lentiginous melanoma (HCC)       Relevant Orders   DME Wheelchair electric      Order given to pt for electric scooter. Flu vaccine today  No follow-ups on file.   Suzan Slick, MD

## 2023-07-14 NOTE — Telephone Encounter (Signed)
Spoke to patient's daughter with concerns of low blood pressures. While at the plastic surgeon today his BP low as well. Patient is having some lightheadedness and tingling on his left side that comes and goes for about 1 month She report reading of :  97/61- 75 117/53- 68 90/66- 85 113/80- 79 87/46-75

## 2023-07-19 DIAGNOSIS — Z9889 Other specified postprocedural states: Secondary | ICD-10-CM | POA: Diagnosis not present

## 2023-07-19 DIAGNOSIS — C439 Malignant melanoma of skin, unspecified: Secondary | ICD-10-CM | POA: Diagnosis not present

## 2023-07-19 DIAGNOSIS — C4371 Malignant melanoma of right lower limb, including hip: Secondary | ICD-10-CM | POA: Diagnosis not present

## 2023-07-20 DIAGNOSIS — C439 Malignant melanoma of skin, unspecified: Secondary | ICD-10-CM | POA: Diagnosis not present

## 2023-07-20 DIAGNOSIS — C4371 Malignant melanoma of right lower limb, including hip: Secondary | ICD-10-CM | POA: Diagnosis not present

## 2023-07-20 DIAGNOSIS — M62461 Contracture of muscle, right lower leg: Secondary | ICD-10-CM | POA: Diagnosis not present

## 2023-07-20 DIAGNOSIS — Z9981 Dependence on supplemental oxygen: Secondary | ICD-10-CM | POA: Diagnosis not present

## 2023-07-20 DIAGNOSIS — C779 Secondary and unspecified malignant neoplasm of lymph node, unspecified: Secondary | ICD-10-CM | POA: Diagnosis not present

## 2023-07-20 DIAGNOSIS — J9611 Chronic respiratory failure with hypoxia: Secondary | ICD-10-CM | POA: Diagnosis not present

## 2023-07-20 DIAGNOSIS — Z6841 Body Mass Index (BMI) 40.0 and over, adult: Secondary | ICD-10-CM | POA: Diagnosis not present

## 2023-07-22 ENCOUNTER — Other Ambulatory Visit: Payer: Self-pay | Admitting: Emergency Medicine

## 2023-07-22 MED ORDER — TORSEMIDE 20 MG PO TABS: 20.0000 mg | ORAL_TABLET | Freq: Every day | ORAL | Status: DC

## 2023-07-22 MED ORDER — SPIRONOLACTONE 25 MG PO TABS: 12.5000 mg | ORAL_TABLET | Freq: Every day | ORAL | Status: DC

## 2023-07-22 NOTE — Progress Notes (Signed)
Croitoru, Rachelle Hora, MD  Scheryl Marten, RN reduce the torsemide 20 mg to once daily, rather than twice daily and have him cut the spironolactone pill in half (take only 12.5 mg once daily).  I sent the patient the instructions directly via MyChart, please make the necessary changes on the medication list.  Thank you   Med list updated.

## 2023-07-25 DIAGNOSIS — C4371 Malignant melanoma of right lower limb, including hip: Secondary | ICD-10-CM | POA: Diagnosis not present

## 2023-07-25 DIAGNOSIS — L89899 Pressure ulcer of other site, unspecified stage: Secondary | ICD-10-CM | POA: Diagnosis not present

## 2023-07-25 DIAGNOSIS — M21861 Other specified acquired deformities of right lower leg: Secondary | ICD-10-CM | POA: Diagnosis not present

## 2023-07-25 DIAGNOSIS — L97519 Non-pressure chronic ulcer of other part of right foot with unspecified severity: Secondary | ICD-10-CM | POA: Diagnosis not present

## 2023-07-25 DIAGNOSIS — M62461 Contracture of muscle, right lower leg: Secondary | ICD-10-CM | POA: Diagnosis not present

## 2023-08-02 DIAGNOSIS — R600 Localized edema: Secondary | ICD-10-CM | POA: Diagnosis not present

## 2023-08-02 DIAGNOSIS — C439 Malignant melanoma of skin, unspecified: Secondary | ICD-10-CM | POA: Diagnosis not present

## 2023-08-02 DIAGNOSIS — C4371 Malignant melanoma of right lower limb, including hip: Secondary | ICD-10-CM | POA: Diagnosis not present

## 2023-08-03 DIAGNOSIS — S91301A Unspecified open wound, right foot, initial encounter: Secondary | ICD-10-CM | POA: Diagnosis not present

## 2023-08-07 DIAGNOSIS — G4733 Obstructive sleep apnea (adult) (pediatric): Secondary | ICD-10-CM | POA: Diagnosis not present

## 2023-08-17 DIAGNOSIS — C439 Malignant melanoma of skin, unspecified: Secondary | ICD-10-CM | POA: Diagnosis not present

## 2023-08-17 DIAGNOSIS — T8149XA Infection following a procedure, other surgical site, initial encounter: Secondary | ICD-10-CM | POA: Diagnosis not present

## 2023-08-17 DIAGNOSIS — L089 Local infection of the skin and subcutaneous tissue, unspecified: Secondary | ICD-10-CM | POA: Diagnosis not present

## 2023-08-17 DIAGNOSIS — Z5112 Encounter for antineoplastic immunotherapy: Secondary | ICD-10-CM | POA: Diagnosis not present

## 2023-08-17 DIAGNOSIS — C4371 Malignant melanoma of right lower limb, including hip: Secondary | ICD-10-CM | POA: Diagnosis not present

## 2023-08-17 DIAGNOSIS — E875 Hyperkalemia: Secondary | ICD-10-CM | POA: Diagnosis not present

## 2023-08-17 DIAGNOSIS — I959 Hypotension, unspecified: Secondary | ICD-10-CM | POA: Diagnosis not present

## 2023-08-24 DIAGNOSIS — M62461 Contracture of muscle, right lower leg: Secondary | ICD-10-CM | POA: Diagnosis not present

## 2023-08-25 DIAGNOSIS — C4371 Malignant melanoma of right lower limb, including hip: Secondary | ICD-10-CM | POA: Diagnosis not present

## 2023-08-25 DIAGNOSIS — Z79620 Long term (current) use of immunosuppressive biologic: Secondary | ICD-10-CM | POA: Diagnosis not present

## 2023-08-25 DIAGNOSIS — Z9889 Other specified postprocedural states: Secondary | ICD-10-CM | POA: Diagnosis not present

## 2023-08-25 DIAGNOSIS — Z6841 Body Mass Index (BMI) 40.0 and over, adult: Secondary | ICD-10-CM | POA: Diagnosis not present

## 2023-08-25 DIAGNOSIS — L97512 Non-pressure chronic ulcer of other part of right foot with fat layer exposed: Secondary | ICD-10-CM | POA: Diagnosis not present

## 2023-08-25 DIAGNOSIS — S91301D Unspecified open wound, right foot, subsequent encounter: Secondary | ICD-10-CM | POA: Diagnosis not present

## 2023-08-25 DIAGNOSIS — T86821 Skin graft (allograft) (autograft) failure: Secondary | ICD-10-CM | POA: Diagnosis not present

## 2023-08-25 DIAGNOSIS — C439 Malignant melanoma of skin, unspecified: Secondary | ICD-10-CM | POA: Diagnosis not present

## 2023-08-25 DIAGNOSIS — L97412 Non-pressure chronic ulcer of right heel and midfoot with fat layer exposed: Secondary | ICD-10-CM | POA: Diagnosis not present

## 2023-08-25 DIAGNOSIS — T86828 Other complications of skin graft (allograft) (autograft): Secondary | ICD-10-CM | POA: Diagnosis not present

## 2023-08-25 DIAGNOSIS — I890 Lymphedema, not elsewhere classified: Secondary | ICD-10-CM | POA: Diagnosis not present

## 2023-08-30 DIAGNOSIS — I890 Lymphedema, not elsewhere classified: Secondary | ICD-10-CM | POA: Diagnosis not present

## 2023-08-30 DIAGNOSIS — C439 Malignant melanoma of skin, unspecified: Secondary | ICD-10-CM | POA: Diagnosis not present

## 2023-08-30 DIAGNOSIS — T86828 Other complications of skin graft (allograft) (autograft): Secondary | ICD-10-CM | POA: Diagnosis not present

## 2023-08-30 DIAGNOSIS — C4371 Malignant melanoma of right lower limb, including hip: Secondary | ICD-10-CM | POA: Diagnosis not present

## 2023-08-30 DIAGNOSIS — Z6841 Body Mass Index (BMI) 40.0 and over, adult: Secondary | ICD-10-CM | POA: Diagnosis not present

## 2023-08-30 DIAGNOSIS — L97512 Non-pressure chronic ulcer of other part of right foot with fat layer exposed: Secondary | ICD-10-CM | POA: Diagnosis not present

## 2023-08-30 DIAGNOSIS — Z9889 Other specified postprocedural states: Secondary | ICD-10-CM | POA: Diagnosis not present

## 2023-08-30 DIAGNOSIS — Z79620 Long term (current) use of immunosuppressive biologic: Secondary | ICD-10-CM | POA: Diagnosis not present

## 2023-08-30 DIAGNOSIS — L97412 Non-pressure chronic ulcer of right heel and midfoot with fat layer exposed: Secondary | ICD-10-CM | POA: Diagnosis not present

## 2023-09-06 DIAGNOSIS — L97412 Non-pressure chronic ulcer of right heel and midfoot with fat layer exposed: Secondary | ICD-10-CM | POA: Diagnosis not present

## 2023-09-06 DIAGNOSIS — Z9889 Other specified postprocedural states: Secondary | ICD-10-CM | POA: Diagnosis not present

## 2023-09-06 DIAGNOSIS — L97512 Non-pressure chronic ulcer of other part of right foot with fat layer exposed: Secondary | ICD-10-CM | POA: Diagnosis not present

## 2023-09-06 DIAGNOSIS — T86828 Other complications of skin graft (allograft) (autograft): Secondary | ICD-10-CM | POA: Diagnosis not present

## 2023-09-06 DIAGNOSIS — C4371 Malignant melanoma of right lower limb, including hip: Secondary | ICD-10-CM | POA: Diagnosis not present

## 2023-09-06 DIAGNOSIS — Z79620 Long term (current) use of immunosuppressive biologic: Secondary | ICD-10-CM | POA: Diagnosis not present

## 2023-09-06 DIAGNOSIS — C439 Malignant melanoma of skin, unspecified: Secondary | ICD-10-CM | POA: Diagnosis not present

## 2023-09-06 DIAGNOSIS — I890 Lymphedema, not elsewhere classified: Secondary | ICD-10-CM | POA: Diagnosis not present

## 2023-09-06 DIAGNOSIS — Z6841 Body Mass Index (BMI) 40.0 and over, adult: Secondary | ICD-10-CM | POA: Diagnosis not present

## 2023-09-08 ENCOUNTER — Encounter: Payer: Self-pay | Admitting: Cardiovascular Disease

## 2023-09-12 DIAGNOSIS — G4733 Obstructive sleep apnea (adult) (pediatric): Secondary | ICD-10-CM | POA: Diagnosis not present

## 2023-09-14 DIAGNOSIS — Z6841 Body Mass Index (BMI) 40.0 and over, adult: Secondary | ICD-10-CM | POA: Diagnosis not present

## 2023-09-14 DIAGNOSIS — S91301D Unspecified open wound, right foot, subsequent encounter: Secondary | ICD-10-CM | POA: Diagnosis not present

## 2023-09-14 DIAGNOSIS — E669 Obesity, unspecified: Secondary | ICD-10-CM | POA: Diagnosis not present

## 2023-09-14 DIAGNOSIS — Z5112 Encounter for antineoplastic immunotherapy: Secondary | ICD-10-CM | POA: Diagnosis not present

## 2023-09-14 DIAGNOSIS — N1831 Chronic kidney disease, stage 3a: Secondary | ICD-10-CM | POA: Diagnosis not present

## 2023-09-14 DIAGNOSIS — X58XXXD Exposure to other specified factors, subsequent encounter: Secondary | ICD-10-CM | POA: Diagnosis not present

## 2023-09-14 DIAGNOSIS — Z79899 Other long term (current) drug therapy: Secondary | ICD-10-CM | POA: Diagnosis not present

## 2023-09-14 DIAGNOSIS — C779 Secondary and unspecified malignant neoplasm of lymph node, unspecified: Secondary | ICD-10-CM | POA: Diagnosis not present

## 2023-09-14 DIAGNOSIS — G4733 Obstructive sleep apnea (adult) (pediatric): Secondary | ICD-10-CM | POA: Diagnosis not present

## 2023-09-14 DIAGNOSIS — C4371 Malignant melanoma of right lower limb, including hip: Secondary | ICD-10-CM | POA: Diagnosis not present

## 2023-09-14 DIAGNOSIS — I890 Lymphedema, not elsewhere classified: Secondary | ICD-10-CM | POA: Diagnosis not present

## 2023-09-14 DIAGNOSIS — C439 Malignant melanoma of skin, unspecified: Secondary | ICD-10-CM | POA: Diagnosis not present

## 2023-09-16 DIAGNOSIS — C4371 Malignant melanoma of right lower limb, including hip: Secondary | ICD-10-CM | POA: Diagnosis not present

## 2023-09-16 DIAGNOSIS — Z5112 Encounter for antineoplastic immunotherapy: Secondary | ICD-10-CM | POA: Diagnosis not present

## 2023-09-16 DIAGNOSIS — S91301D Unspecified open wound, right foot, subsequent encounter: Secondary | ICD-10-CM | POA: Diagnosis not present

## 2023-09-16 DIAGNOSIS — E669 Obesity, unspecified: Secondary | ICD-10-CM | POA: Diagnosis not present

## 2023-09-16 DIAGNOSIS — Z6841 Body Mass Index (BMI) 40.0 and over, adult: Secondary | ICD-10-CM | POA: Diagnosis not present

## 2023-09-16 DIAGNOSIS — I890 Lymphedema, not elsewhere classified: Secondary | ICD-10-CM | POA: Diagnosis not present

## 2023-09-16 DIAGNOSIS — G4733 Obstructive sleep apnea (adult) (pediatric): Secondary | ICD-10-CM | POA: Diagnosis not present

## 2023-09-16 DIAGNOSIS — Z79899 Other long term (current) drug therapy: Secondary | ICD-10-CM | POA: Diagnosis not present

## 2023-09-16 DIAGNOSIS — X58XXXD Exposure to other specified factors, subsequent encounter: Secondary | ICD-10-CM | POA: Diagnosis not present

## 2023-09-16 DIAGNOSIS — C439 Malignant melanoma of skin, unspecified: Secondary | ICD-10-CM | POA: Diagnosis not present

## 2023-09-20 ENCOUNTER — Other Ambulatory Visit: Payer: Self-pay | Admitting: Cardiovascular Disease

## 2023-09-20 DIAGNOSIS — L97512 Non-pressure chronic ulcer of other part of right foot with fat layer exposed: Secondary | ICD-10-CM | POA: Diagnosis not present

## 2023-09-20 DIAGNOSIS — S91301D Unspecified open wound, right foot, subsequent encounter: Secondary | ICD-10-CM | POA: Diagnosis not present

## 2023-09-20 DIAGNOSIS — Z85820 Personal history of malignant melanoma of skin: Secondary | ICD-10-CM | POA: Diagnosis not present

## 2023-09-20 DIAGNOSIS — Z9981 Dependence on supplemental oxygen: Secondary | ICD-10-CM | POA: Diagnosis not present

## 2023-09-20 DIAGNOSIS — C4371 Malignant melanoma of right lower limb, including hip: Secondary | ICD-10-CM | POA: Diagnosis not present

## 2023-09-23 DIAGNOSIS — T86821 Skin graft (allograft) (autograft) failure: Secondary | ICD-10-CM | POA: Diagnosis not present

## 2023-09-23 DIAGNOSIS — Z85820 Personal history of malignant melanoma of skin: Secondary | ICD-10-CM | POA: Diagnosis not present

## 2023-09-23 DIAGNOSIS — L97909 Non-pressure chronic ulcer of unspecified part of unspecified lower leg with unspecified severity: Secondary | ICD-10-CM | POA: Diagnosis not present

## 2023-09-23 DIAGNOSIS — Z9981 Dependence on supplemental oxygen: Secondary | ICD-10-CM | POA: Diagnosis not present

## 2023-09-23 DIAGNOSIS — C4371 Malignant melanoma of right lower limb, including hip: Secondary | ICD-10-CM | POA: Diagnosis not present

## 2023-09-23 DIAGNOSIS — I83009 Varicose veins of unspecified lower extremity with ulcer of unspecified site: Secondary | ICD-10-CM | POA: Diagnosis not present

## 2023-09-23 DIAGNOSIS — L97512 Non-pressure chronic ulcer of other part of right foot with fat layer exposed: Secondary | ICD-10-CM | POA: Diagnosis not present

## 2023-09-27 DIAGNOSIS — T86821 Skin graft (allograft) (autograft) failure: Secondary | ICD-10-CM | POA: Diagnosis not present

## 2023-09-27 DIAGNOSIS — Z9981 Dependence on supplemental oxygen: Secondary | ICD-10-CM | POA: Diagnosis not present

## 2023-09-27 DIAGNOSIS — L97512 Non-pressure chronic ulcer of other part of right foot with fat layer exposed: Secondary | ICD-10-CM | POA: Diagnosis not present

## 2023-09-27 DIAGNOSIS — C4371 Malignant melanoma of right lower limb, including hip: Secondary | ICD-10-CM | POA: Diagnosis not present

## 2023-10-04 DIAGNOSIS — L97512 Non-pressure chronic ulcer of other part of right foot with fat layer exposed: Secondary | ICD-10-CM | POA: Diagnosis not present

## 2023-10-04 DIAGNOSIS — C4371 Malignant melanoma of right lower limb, including hip: Secondary | ICD-10-CM | POA: Diagnosis not present

## 2023-10-04 DIAGNOSIS — Z9981 Dependence on supplemental oxygen: Secondary | ICD-10-CM | POA: Diagnosis not present

## 2023-10-04 DIAGNOSIS — T86821 Skin graft (allograft) (autograft) failure: Secondary | ICD-10-CM | POA: Diagnosis not present

## 2023-10-11 DIAGNOSIS — C4371 Malignant melanoma of right lower limb, including hip: Secondary | ICD-10-CM | POA: Diagnosis not present

## 2023-10-11 DIAGNOSIS — R600 Localized edema: Secondary | ICD-10-CM | POA: Diagnosis not present

## 2023-10-11 DIAGNOSIS — S91301D Unspecified open wound, right foot, subsequent encounter: Secondary | ICD-10-CM | POA: Diagnosis not present

## 2023-10-11 DIAGNOSIS — Z9889 Other specified postprocedural states: Secondary | ICD-10-CM | POA: Diagnosis not present

## 2023-10-11 DIAGNOSIS — Z85820 Personal history of malignant melanoma of skin: Secondary | ICD-10-CM | POA: Diagnosis not present

## 2023-10-11 DIAGNOSIS — T86821 Skin graft (allograft) (autograft) failure: Secondary | ICD-10-CM | POA: Diagnosis not present

## 2023-10-11 DIAGNOSIS — L97512 Non-pressure chronic ulcer of other part of right foot with fat layer exposed: Secondary | ICD-10-CM | POA: Diagnosis not present

## 2023-10-12 DIAGNOSIS — Z5112 Encounter for antineoplastic immunotherapy: Secondary | ICD-10-CM | POA: Diagnosis not present

## 2023-10-12 DIAGNOSIS — C439 Malignant melanoma of skin, unspecified: Secondary | ICD-10-CM | POA: Diagnosis not present

## 2023-10-12 DIAGNOSIS — C4371 Malignant melanoma of right lower limb, including hip: Secondary | ICD-10-CM | POA: Diagnosis not present

## 2023-10-12 DIAGNOSIS — D649 Anemia, unspecified: Secondary | ICD-10-CM | POA: Diagnosis not present

## 2023-10-18 DIAGNOSIS — T86821 Skin graft (allograft) (autograft) failure: Secondary | ICD-10-CM | POA: Diagnosis not present

## 2023-10-18 DIAGNOSIS — R600 Localized edema: Secondary | ICD-10-CM | POA: Diagnosis not present

## 2023-10-18 DIAGNOSIS — C4371 Malignant melanoma of right lower limb, including hip: Secondary | ICD-10-CM | POA: Diagnosis not present

## 2023-10-18 DIAGNOSIS — L97512 Non-pressure chronic ulcer of other part of right foot with fat layer exposed: Secondary | ICD-10-CM | POA: Diagnosis not present

## 2023-10-18 DIAGNOSIS — L97412 Non-pressure chronic ulcer of right heel and midfoot with fat layer exposed: Secondary | ICD-10-CM | POA: Diagnosis not present

## 2023-10-18 DIAGNOSIS — Z9889 Other specified postprocedural states: Secondary | ICD-10-CM | POA: Diagnosis not present

## 2023-10-25 DIAGNOSIS — R600 Localized edema: Secondary | ICD-10-CM | POA: Diagnosis not present

## 2023-10-25 DIAGNOSIS — T86821 Skin graft (allograft) (autograft) failure: Secondary | ICD-10-CM | POA: Diagnosis not present

## 2023-10-25 DIAGNOSIS — Z9889 Other specified postprocedural states: Secondary | ICD-10-CM | POA: Diagnosis not present

## 2023-10-25 DIAGNOSIS — C4371 Malignant melanoma of right lower limb, including hip: Secondary | ICD-10-CM | POA: Diagnosis not present

## 2023-10-25 DIAGNOSIS — L97412 Non-pressure chronic ulcer of right heel and midfoot with fat layer exposed: Secondary | ICD-10-CM | POA: Diagnosis not present

## 2023-10-25 DIAGNOSIS — L97512 Non-pressure chronic ulcer of other part of right foot with fat layer exposed: Secondary | ICD-10-CM | POA: Diagnosis not present

## 2023-10-25 DIAGNOSIS — Z85820 Personal history of malignant melanoma of skin: Secondary | ICD-10-CM | POA: Diagnosis not present

## 2023-11-01 DIAGNOSIS — Z85820 Personal history of malignant melanoma of skin: Secondary | ICD-10-CM | POA: Diagnosis not present

## 2023-11-01 DIAGNOSIS — C4371 Malignant melanoma of right lower limb, including hip: Secondary | ICD-10-CM | POA: Diagnosis not present

## 2023-11-01 DIAGNOSIS — Z9889 Other specified postprocedural states: Secondary | ICD-10-CM | POA: Diagnosis not present

## 2023-11-01 DIAGNOSIS — L97512 Non-pressure chronic ulcer of other part of right foot with fat layer exposed: Secondary | ICD-10-CM | POA: Diagnosis not present

## 2023-11-01 DIAGNOSIS — R600 Localized edema: Secondary | ICD-10-CM | POA: Diagnosis not present

## 2023-11-01 DIAGNOSIS — S91301D Unspecified open wound, right foot, subsequent encounter: Secondary | ICD-10-CM | POA: Diagnosis not present

## 2023-11-05 DIAGNOSIS — G4733 Obstructive sleep apnea (adult) (pediatric): Secondary | ICD-10-CM | POA: Diagnosis not present

## 2023-11-08 DIAGNOSIS — Z9981 Dependence on supplemental oxygen: Secondary | ICD-10-CM | POA: Diagnosis not present

## 2023-11-08 DIAGNOSIS — L97512 Non-pressure chronic ulcer of other part of right foot with fat layer exposed: Secondary | ICD-10-CM | POA: Diagnosis not present

## 2023-11-08 DIAGNOSIS — C4371 Malignant melanoma of right lower limb, including hip: Secondary | ICD-10-CM | POA: Diagnosis not present

## 2023-11-08 DIAGNOSIS — S91301D Unspecified open wound, right foot, subsequent encounter: Secondary | ICD-10-CM | POA: Diagnosis not present

## 2023-11-09 ENCOUNTER — Encounter: Payer: Self-pay | Admitting: Cardiovascular Disease

## 2023-11-09 DIAGNOSIS — C439 Malignant melanoma of skin, unspecified: Secondary | ICD-10-CM | POA: Diagnosis not present

## 2023-11-09 DIAGNOSIS — E861 Hypovolemia: Secondary | ICD-10-CM | POA: Diagnosis not present

## 2023-11-09 DIAGNOSIS — Z79899 Other long term (current) drug therapy: Secondary | ICD-10-CM

## 2023-11-09 DIAGNOSIS — I5032 Chronic diastolic (congestive) heart failure: Secondary | ICD-10-CM

## 2023-11-09 DIAGNOSIS — D649 Anemia, unspecified: Secondary | ICD-10-CM | POA: Diagnosis not present

## 2023-11-09 DIAGNOSIS — Z5112 Encounter for antineoplastic immunotherapy: Secondary | ICD-10-CM | POA: Diagnosis not present

## 2023-11-09 DIAGNOSIS — R7989 Other specified abnormal findings of blood chemistry: Secondary | ICD-10-CM | POA: Diagnosis not present

## 2023-11-09 DIAGNOSIS — I5020 Unspecified systolic (congestive) heart failure: Secondary | ICD-10-CM | POA: Diagnosis not present

## 2023-11-09 DIAGNOSIS — C4371 Malignant melanoma of right lower limb, including hip: Secondary | ICD-10-CM | POA: Diagnosis not present

## 2023-11-09 NOTE — Telephone Encounter (Signed)
 I recommend stopping the spironolactone permanently and changing the torsemide to "as needed" only.  Can we please also order an echocardiogram for chronic diastolic heart failure, please.  Please repeat the basic metabolic panel roughly a week after making those medication changes and  also check a BNP when that blood is drawn.

## 2023-11-10 MED ORDER — TORSEMIDE 20 MG PO TABS: 20.0000 mg | ORAL_TABLET | ORAL | 3 refills | Status: DC | PRN

## 2023-11-10 NOTE — Telephone Encounter (Signed)
 Noted  Spironolactone d/c from medication list  Torsemide Rx changed to as needed  Labs order placed for BMP and BNP  Order placed for Echo   Mychart message sent to patient

## 2023-11-10 NOTE — Telephone Encounter (Signed)
 April is fine for the echo

## 2023-11-11 NOTE — Telephone Encounter (Signed)
 Echo has  already been schedule for May 7,2025

## 2023-11-15 DIAGNOSIS — L97512 Non-pressure chronic ulcer of other part of right foot with fat layer exposed: Secondary | ICD-10-CM | POA: Diagnosis not present

## 2023-11-15 DIAGNOSIS — S91301A Unspecified open wound, right foot, initial encounter: Secondary | ICD-10-CM | POA: Diagnosis not present

## 2023-11-15 DIAGNOSIS — C4371 Malignant melanoma of right lower limb, including hip: Secondary | ICD-10-CM | POA: Diagnosis not present

## 2023-11-15 DIAGNOSIS — Z9981 Dependence on supplemental oxygen: Secondary | ICD-10-CM | POA: Diagnosis not present

## 2023-11-16 ENCOUNTER — Encounter: Payer: Self-pay | Admitting: Family Medicine

## 2023-11-16 ENCOUNTER — Ambulatory Visit (INDEPENDENT_AMBULATORY_CARE_PROVIDER_SITE_OTHER): Admitting: Family Medicine

## 2023-11-16 ENCOUNTER — Ambulatory Visit

## 2023-11-16 VITALS — BP 119/65 | HR 66 | Temp 98.8°F | Ht 67.0 in | Wt 327.4 lb

## 2023-11-16 DIAGNOSIS — M545 Low back pain, unspecified: Secondary | ICD-10-CM | POA: Insufficient documentation

## 2023-11-16 DIAGNOSIS — R9389 Abnormal findings on diagnostic imaging of other specified body structures: Secondary | ICD-10-CM | POA: Diagnosis not present

## 2023-11-16 DIAGNOSIS — J9811 Atelectasis: Secondary | ICD-10-CM

## 2023-11-16 DIAGNOSIS — R062 Wheezing: Secondary | ICD-10-CM | POA: Diagnosis not present

## 2023-11-16 MED ORDER — TRAMADOL HCL 50 MG PO TABS: 50.0000 mg | ORAL_TABLET | Freq: Two times a day (BID) | ORAL | 0 refills | Status: AC | PRN

## 2023-11-16 MED ORDER — ALBUTEROL SULFATE HFA 108 (90 BASE) MCG/ACT IN AERS
2.0000 | INHALATION_SPRAY | Freq: Four times a day (QID) | RESPIRATORY_TRACT | 0 refills | Status: DC | PRN
Start: 2023-11-16 — End: 2024-02-10

## 2023-11-16 NOTE — Patient Instructions (Signed)
 [facility]  [address]  The radiology department is on the first floor which is best accessed by going around to the back of the building. No appointment necessary. You can go at your convenience.

## 2023-11-16 NOTE — Assessment & Plan Note (Signed)
 Impaired mobility due to melanoma on the bottom of right foot. Believes low back pain started due to this mobility issue.  Reports 3 days ago he had low back pain while getting out of bed. Denies radiating pain, injury, changes in bowel and bladder function, saddle paresthesia.  Interested in something for pain due to not being able to use NSAIDs due to kidney function. Tramadol 50 mg every 12 hours as needed for pain x 5 days. PDMP reviewed no red flags. Follow-up with PCP if symptoms do not resolve. Unable to bear weight on right foot, unable to do adequate back assessment today.

## 2023-11-16 NOTE — Assessment & Plan Note (Addendum)
 Reports cardiology stopped his spironolactone and torsemide due to his declining kidney function.  He is scheduled for a 2D echo in May per cardiology.  Reports  for the last 2 to 3 weeks he has developed wheezing. This was present before stopping the diuretics. LLE edema not worse than usual.  Denies shortness of breath.  Has a history of congestive heart failure, no increased swelling, oxygen saturations at home are usually greater than 90%.  There is no obvious shortness of breath in office today, able to communicate with provider without difficulty.  There was some wheezing bilateral bases, will send for chest x-ray to evaluate.  Albuterol inhaler every 12 hours as needed for wheezing. Denies recent illness, does not feel unwell. Vital signs are stable, oxygen saturation 94% on room air. Follow-up with PCP.

## 2023-11-16 NOTE — Progress Notes (Signed)
 Established Patient Office Visit  Subjective   Patient ID: Justin Rasmussen, male    DOB: [birth year]  Age: 68 y.o. MRN: [id number]  Chief Complaint  Patient presents with   Back Pain    Low back pain x 3 days    Wheezing    Wheezing , nasal congestion x 2 - 3 weeks not moving around a lot on knee scooter - melanoma was removed from Right foot and skin was grafted x around 6 months ago.     HPI On immunotherapy, cards held spironolactone and torsemide due to kidney function. Cards to get labs soon for cards  Echo scheduled in May once he can stand without wearing boot.  Reports wheezing before these meds were held. Wheezing present for past 2-3 weeks. Denies shortness of breath. Oxygen saturation is usually > 90%.  Does not feel like this is related to CHF. Denies recent illness. History of CHF, using oxygen wit CPAP and if saturations < 85%. Has wound on foot, wearing boot, oxygen is helping wound healing.   Back pain: started 3  days ago when getting out bed. Symptoms have been getting better. Taking Tylenol not effective. Unable to take NSAIDS due to kidney function and heart issues. Has melanoma on  bottom of right foot, wearing boot. Mobilty has been affected. No radiating pain. No injury No changes in bowel/bladder function. No saddle paresthesia.        Review of Systems  Constitutional:  Negative for chills and fever.  Respiratory:  Positive for wheezing. Negative for shortness of breath.       Objective:     BP 119/65   Pulse 66   Temp 98.8 F (37.1 C)   Ht 5\' 7"  (1.702 m)   Wt (!) 327 lb 6 oz (148.5 kg)   SpO2 94% Comment: oxygen initial read was 88% but increased to 94%  BMI 51.27 kg/m  BP Readings from Last 3 Encounters:  11/16/23 119/65  07/12/23 97/61  03/28/23 (!) 102/50      Physical Exam Vitals and nursing note reviewed.  Constitutional:      General: He is not in acute distress.    Appearance: Normal appearance. He is obese. He  is not ill-appearing.  Cardiovascular:     Rate and Rhythm: Normal rate and regular rhythm.     Heart sounds: Normal heart sounds.     Comments: LLE edema not worse since being off diuretics.  Pulmonary:     Breath sounds: Wheezing present.  Musculoskeletal:     Right lower leg: No edema.     Left lower leg: 1+ Edema present.  Skin:    General: Skin is warm and dry.  Neurological:     General: No focal deficit present.     Mental Status: He is alert. Mental status is at baseline.  Psychiatric:        Mood and Affect: Mood normal.        Behavior: Behavior normal.        Thought Content: Thought content normal.        Judgment: Judgment normal.     No results found for any visits on 11/16/23.    The 10-year ASCVD risk score (Arnett DK, et al., 2019) is: 14.9%    Assessment & Plan:   Problem List Items Addressed This Visit     Wheezing on auscultation - Primary   Reports cardiology stopped his spironolactone and torsemide due to his declining kidney function.  He is scheduled for a 2D echo in May per cardiology.  Reports  for the last 2 to 3 weeks he has developed wheezing. This was present before stopping the diuretics. LLE edema not worse than usual.  Denies shortness of breath.  Has a history of congestive heart failure, no increased swelling, oxygen saturations at home are usually greater than 90%.  There is no obvious shortness of breath in office today, able to communicate with provider without difficulty.  There was some wheezing bilateral bases, will send for chest x-ray to evaluate.  Albuterol inhaler every 12 hours as needed for wheezing. Denies recent illness, does not feel unwell. Vital signs are stable, oxygen saturation 94% on room air. Follow-up with PCP.       Relevant Medications   albuterol (VENTOLIN HFA) 108 (90 Base) MCG/ACT inhaler   Other Relevant Orders   DG Chest 2 View   Acute midline low back pain without sciatica   Impaired mobility due to  melanoma on the bottom of right foot. Believes low back pain started due to this mobility issue.  Reports 3 days ago he had low back pain while getting out of bed. Denies radiating pain, injury, changes in bowel and bladder function, saddle paresthesia.  Interested in something for pain due to not being able to use NSAIDs due to kidney function. Tramadol 50 mg every 12 hours as needed for pain x 5 days. PDMP reviewed no red flags. Follow-up with PCP if symptoms do not resolve. Unable to bear weight on right foot, unable to do adequate back assessment today.      Relevant Medications   traMADol (ULTRAM) 50 MG tablet  Agrees with plan of care discussed.  Questions answered.   Return in about 1 week (around 11/23/2023) for with PCP for wheezing.    Novella Olive, FNP

## 2023-11-29 DIAGNOSIS — L97512 Non-pressure chronic ulcer of other part of right foot with fat layer exposed: Secondary | ICD-10-CM | POA: Diagnosis not present

## 2023-11-29 DIAGNOSIS — Z9981 Dependence on supplemental oxygen: Secondary | ICD-10-CM | POA: Diagnosis not present

## 2023-11-29 DIAGNOSIS — C4371 Malignant melanoma of right lower limb, including hip: Secondary | ICD-10-CM | POA: Diagnosis not present

## 2023-12-07 DIAGNOSIS — N1831 Chronic kidney disease, stage 3a: Secondary | ICD-10-CM | POA: Diagnosis not present

## 2023-12-07 DIAGNOSIS — J9611 Chronic respiratory failure with hypoxia: Secondary | ICD-10-CM | POA: Diagnosis not present

## 2023-12-07 DIAGNOSIS — C779 Secondary and unspecified malignant neoplasm of lymph node, unspecified: Secondary | ICD-10-CM | POA: Diagnosis not present

## 2023-12-07 DIAGNOSIS — Z9981 Dependence on supplemental oxygen: Secondary | ICD-10-CM | POA: Diagnosis not present

## 2023-12-07 DIAGNOSIS — Z79899 Other long term (current) drug therapy: Secondary | ICD-10-CM | POA: Diagnosis not present

## 2023-12-07 DIAGNOSIS — C4371 Malignant melanoma of right lower limb, including hip: Secondary | ICD-10-CM | POA: Diagnosis not present

## 2023-12-07 DIAGNOSIS — C439 Malignant melanoma of skin, unspecified: Secondary | ICD-10-CM | POA: Diagnosis not present

## 2023-12-07 DIAGNOSIS — Z5112 Encounter for antineoplastic immunotherapy: Secondary | ICD-10-CM | POA: Diagnosis not present

## 2023-12-11 DIAGNOSIS — G4733 Obstructive sleep apnea (adult) (pediatric): Secondary | ICD-10-CM | POA: Diagnosis not present

## 2023-12-12 ENCOUNTER — Telehealth: Admitting: Physician Assistant

## 2023-12-12 DIAGNOSIS — J019 Acute sinusitis, unspecified: Secondary | ICD-10-CM

## 2023-12-12 DIAGNOSIS — G4733 Obstructive sleep apnea (adult) (pediatric): Secondary | ICD-10-CM | POA: Diagnosis not present

## 2023-12-12 DIAGNOSIS — B9689 Other specified bacterial agents as the cause of diseases classified elsewhere: Secondary | ICD-10-CM

## 2023-12-12 MED ORDER — AMOXICILLIN-POT CLAVULANATE 875-125 MG PO TABS: 1.0000 | ORAL_TABLET | Freq: Two times a day (BID) | ORAL | 0 refills | Status: DC

## 2023-12-12 NOTE — Progress Notes (Signed)

## 2023-12-13 DIAGNOSIS — Z85820 Personal history of malignant melanoma of skin: Secondary | ICD-10-CM | POA: Diagnosis not present

## 2023-12-13 DIAGNOSIS — Z945 Skin transplant status: Secondary | ICD-10-CM | POA: Diagnosis not present

## 2023-12-13 DIAGNOSIS — L97512 Non-pressure chronic ulcer of other part of right foot with fat layer exposed: Secondary | ICD-10-CM | POA: Diagnosis not present

## 2023-12-13 DIAGNOSIS — Z9889 Other specified postprocedural states: Secondary | ICD-10-CM | POA: Diagnosis not present

## 2023-12-16 DIAGNOSIS — G4733 Obstructive sleep apnea (adult) (pediatric): Secondary | ICD-10-CM | POA: Diagnosis not present

## 2024-01-04 DIAGNOSIS — N1831 Chronic kidney disease, stage 3a: Secondary | ICD-10-CM | POA: Diagnosis not present

## 2024-01-04 DIAGNOSIS — C4371 Malignant melanoma of right lower limb, including hip: Secondary | ICD-10-CM | POA: Diagnosis not present

## 2024-01-04 DIAGNOSIS — C439 Malignant melanoma of skin, unspecified: Secondary | ICD-10-CM | POA: Diagnosis not present

## 2024-01-04 DIAGNOSIS — J9611 Chronic respiratory failure with hypoxia: Secondary | ICD-10-CM | POA: Diagnosis not present

## 2024-01-04 DIAGNOSIS — Z5112 Encounter for antineoplastic immunotherapy: Secondary | ICD-10-CM | POA: Diagnosis not present

## 2024-01-04 DIAGNOSIS — Z79899 Other long term (current) drug therapy: Secondary | ICD-10-CM | POA: Diagnosis not present

## 2024-01-04 DIAGNOSIS — C779 Secondary and unspecified malignant neoplasm of lymph node, unspecified: Secondary | ICD-10-CM | POA: Diagnosis not present

## 2024-01-04 DIAGNOSIS — Z9981 Dependence on supplemental oxygen: Secondary | ICD-10-CM | POA: Diagnosis not present

## 2024-01-08 ENCOUNTER — Telehealth: Admitting: Family Medicine

## 2024-01-08 DIAGNOSIS — J301 Allergic rhinitis due to pollen: Secondary | ICD-10-CM | POA: Diagnosis not present

## 2024-01-08 MED ORDER — PREDNISONE 20 MG PO TABS: 20.0000 mg | ORAL_TABLET | Freq: Two times a day (BID) | ORAL | 0 refills | Status: AC

## 2024-01-08 MED ORDER — PREDNISONE 20 MG PO TABS: 20.0000 mg | ORAL_TABLET | Freq: Two times a day (BID) | ORAL | 0 refills | Status: DC

## 2024-01-08 NOTE — Progress Notes (Signed)
 E visit for Allergic Rhinitis We are sorry that you are not feeling well.  Here is how we plan to help!  Based on what you have shared with me it looks like you have Allergic Rhinitis.  Rhinitis is when a reaction occurs that causes nasal congestion, runny nose, sneezing, and itching.  Most types of rhinitis are caused by an inflammation and are associated with symptoms in the eyes ears or throat. There are several types of rhinitis.  The most common are acute rhinitis, which is usually caused by a viral illness, allergic or seasonal rhinitis, and nonallergic or year-round rhinitis.  Nasal allergies occur certain times of the year.  Allergic rhinitis is caused when allergens in the air trigger the release of histamine in the body.  Histamine causes itching, swelling, and fluid to build up in the fragile linings of the nasal passages, sinuses and eyelids.  An itchy nose and clear discharge are common.  I recommend the following over the counter treatments: You should take a daily dose of antihistamine  I also would recommend a nasal spray: Flonase  2 sprays into each nostril once daily  You may also benefit from eye drops such as: Visine 1-2 drops each eye twice daily as needed  I am also sending prednisone.   HOME CARE:  You can use an over-the-counter saline nasal spray as needed Avoid areas where there is heavy dust, mites, or molds Stay indoors on windy days during the pollen season Keep windows closed in home, at least in bedroom; use air conditioner. Use high-efficiency house air filter Keep windows closed in car, turn AC on re-circulate Avoid playing out with dog during pollen season  GET HELP RIGHT AWAY IF:  If your symptoms do not improve within 10 days You become short of breath You develop yellow or green discharge from your nose for over 3 days You have coughing fits  MAKE SURE YOU:  Understand these instructions Will watch your condition Will get help right away if  you are not doing well or get worse  Thank you for choosing an e-visit. Your e-visit answers were reviewed by a board certified advanced clinical practitioner to complete your personal care plan. Depending upon the condition, your plan could have included both over the counter or prescription medications. Please review your pharmacy choice. Be sure that the pharmacy you have chosen is open so that you can pick up your prescription now.  If there is a problem you may message your provider in MyChart to have the prescription routed to another pharmacy. Your safety is important to us . If you have drug allergies check your prescription carefully.  For the next 24 hours, you can use MyChart to ask questions about today's visit, request a non-urgent call back, or ask for a work or school excuse from your e-visit provider. You will get an email in the next two days asking about your experience. I hope that your e-visit has been valuable and will speed your recovery.   have provided 5 minutes of non face to face time during this encounter for chart review and documentation.

## 2024-01-08 NOTE — Addendum Note (Signed)
 Addended by: Albertha Huger on: 01/08/2024 09:43 AM   Modules accepted: Orders

## 2024-01-10 DIAGNOSIS — I87311 Chronic venous hypertension (idiopathic) with ulcer of right lower extremity: Secondary | ICD-10-CM | POA: Diagnosis not present

## 2024-01-10 DIAGNOSIS — Z79899 Other long term (current) drug therapy: Secondary | ICD-10-CM | POA: Diagnosis not present

## 2024-01-10 DIAGNOSIS — Z85820 Personal history of malignant melanoma of skin: Secondary | ICD-10-CM | POA: Diagnosis not present

## 2024-01-10 DIAGNOSIS — L97512 Non-pressure chronic ulcer of other part of right foot with fat layer exposed: Secondary | ICD-10-CM | POA: Diagnosis not present

## 2024-01-10 DIAGNOSIS — Z9981 Dependence on supplemental oxygen: Secondary | ICD-10-CM | POA: Diagnosis not present

## 2024-01-10 DIAGNOSIS — Z7984 Long term (current) use of oral hypoglycemic drugs: Secondary | ICD-10-CM | POA: Diagnosis not present

## 2024-01-10 DIAGNOSIS — Z6841 Body Mass Index (BMI) 40.0 and over, adult: Secondary | ICD-10-CM | POA: Diagnosis not present

## 2024-01-11 ENCOUNTER — Encounter: Payer: Self-pay | Admitting: Cardiovascular Disease

## 2024-01-11 ENCOUNTER — Ambulatory Visit (HOSPITAL_COMMUNITY): Attending: Cardiovascular Disease

## 2024-01-11 DIAGNOSIS — I5032 Chronic diastolic (congestive) heart failure: Secondary | ICD-10-CM | POA: Diagnosis not present

## 2024-01-11 LAB — ECHOCARDIOGRAM COMPLETE
AR max vel: 2.13 cm2
AV Area VTI: 2.2 cm2
AV Area mean vel: 2.29 cm2
AV Mean grad: 9 mmHg
AV Peak grad: 19.4 mmHg
Ao pk vel: 2.2 m/s
Area-P 1/2: 3.16 cm2
S' Lateral: 4.1 cm

## 2024-01-20 ENCOUNTER — Telehealth: Admitting: Nurse Practitioner

## 2024-01-20 DIAGNOSIS — J40 Bronchitis, not specified as acute or chronic: Secondary | ICD-10-CM | POA: Diagnosis not present

## 2024-01-20 MED ORDER — DOXYCYCLINE HYCLATE 100 MG PO TABS: 100.0000 mg | ORAL_TABLET | Freq: Two times a day (BID) | ORAL | 0 refills | Status: AC

## 2024-01-20 MED ORDER — BENZONATATE 100 MG PO CAPS: 100.0000 mg | ORAL_CAPSULE | Freq: Three times a day (TID) | ORAL | 0 refills | Status: DC | PRN

## 2024-01-20 NOTE — Progress Notes (Signed)
 E-Visit for Cough   We are sorry that you are not feeling well.  Here is how we plan to help!  Based on your presentation I believe you most likely have A cough due to bacteria.  When patients have a fever and a productive cough with a change in color or increased sputum production, we are concerned about bacterial bronchitis.  If left untreated it can progress to pneumonia.  If your symptoms do not improve with your treatment plan it is important that you contact your provider.   I have prescribed Doxycycline  100 mg twice a day for 7 days     In addition you may use A prescription cough medication called Tessalon  Perles 100mg . You may take 1-2 capsules every 8 hours as needed for your cough.   From your responses in the eVisit questionnaire you describe inflammation in the upper respiratory tract which is causing a significant cough.  This is commonly called Bronchitis and has four common causes:   Allergies Viral Infections Acid Reflux Bacterial Infection Allergies, viruses and acid reflux are treated by controlling symptoms or eliminating the cause. An example might be a cough caused by taking certain blood pressure medications. You stop the cough by changing the medication. Another example might be a cough caused by acid reflux. Controlling the reflux helps control the cough.  USE OF BRONCHODILATOR ("RESCUE") INHALERS: There is a risk from using your bronchodilator too frequently.  The risk is that over-reliance on a medication which only relaxes the muscles surrounding the breathing tubes can reduce the effectiveness of medications prescribed to reduce swelling and congestion of the tubes themselves.  Although you feel brief relief from the bronchodilator inhaler, your asthma may actually be worsening with the tubes becoming more swollen and filled with mucus.  This can delay other crucial treatments, such as oral steroid medications. If you need to use a bronchodilator inhaler daily, several  times per day, you should discuss this with your provider.  There are probably better treatments that could be used to keep your asthma under control.     HOME CARE Only take medications as instructed by your medical team. Complete the entire course of an antibiotic. Drink plenty of fluids and get plenty of rest. Avoid close contacts especially the very young and the elderly Cover your mouth if you cough or cough into your sleeve. Always remember to wash your hands A steam or ultrasonic humidifier can help congestion.   GET HELP RIGHT AWAY IF: You develop worsening fever. You become short of breath You cough up blood. Your symptoms persist after you have completed your treatment plan MAKE SURE YOU  Understand these instructions. Will watch your condition. Will get help right away if you are not doing well or get worse.    Thank you for choosing an e-visit.  Your e-visit answers were reviewed by a board certified advanced clinical practitioner to complete your personal care plan. Depending upon the condition, your plan could have included both over the counter or prescription medications.  Please review your pharmacy choice. Make sure the pharmacy is open so you can pick up prescription now. If there is a problem, you may contact your provider through [employer] and have the prescription routed to another pharmacy.  Your safety is important to us . If you have drug allergies check your prescription carefully.   For the next 24 hours you can use MyChart to ask questions about today's visit, request a non-urgent call back, or ask  for a work or school excuse. You will get an email in the next two days asking about your experience. I hope that your e-visit has been valuable and will speed your recovery.  I spent approximately 5 minutes reviewing the patient's history, current symptoms and coordinating their care today.

## 2024-01-27 DIAGNOSIS — Z85828 Personal history of other malignant neoplasm of skin: Secondary | ICD-10-CM | POA: Diagnosis not present

## 2024-01-27 DIAGNOSIS — C4371 Malignant melanoma of right lower limb, including hip: Secondary | ICD-10-CM | POA: Diagnosis not present

## 2024-01-27 DIAGNOSIS — Z08 Encounter for follow-up examination after completed treatment for malignant neoplasm: Secondary | ICD-10-CM | POA: Diagnosis not present

## 2024-01-27 DIAGNOSIS — C439 Malignant melanoma of skin, unspecified: Secondary | ICD-10-CM | POA: Diagnosis not present

## 2024-02-01 DIAGNOSIS — Z5112 Encounter for antineoplastic immunotherapy: Secondary | ICD-10-CM | POA: Diagnosis not present

## 2024-02-01 DIAGNOSIS — C439 Malignant melanoma of skin, unspecified: Secondary | ICD-10-CM | POA: Diagnosis not present

## 2024-02-01 DIAGNOSIS — C4371 Malignant melanoma of right lower limb, including hip: Secondary | ICD-10-CM | POA: Diagnosis not present

## 2024-02-01 DIAGNOSIS — D649 Anemia, unspecified: Secondary | ICD-10-CM | POA: Diagnosis not present

## 2024-02-05 DIAGNOSIS — G4733 Obstructive sleep apnea (adult) (pediatric): Secondary | ICD-10-CM | POA: Diagnosis not present

## 2024-02-10 ENCOUNTER — Other Ambulatory Visit: Payer: Self-pay

## 2024-02-10 ENCOUNTER — Telehealth: Admitting: Physician Assistant

## 2024-02-10 ENCOUNTER — Ambulatory Visit
Admission: RE | Admit: 2024-02-10 | Discharge: 2024-02-10 | Disposition: A | Discharge status: Home / Self Care | Source: Ambulatory Visit | Attending: Family Medicine | Admitting: Family Medicine

## 2024-02-10 VITALS — BP 138/75 | HR 68 | Temp 98.1°F | Resp 20

## 2024-02-10 DIAGNOSIS — R053 Chronic cough: Secondary | ICD-10-CM

## 2024-02-10 DIAGNOSIS — J302 Other seasonal allergic rhinitis: Secondary | ICD-10-CM

## 2024-02-10 DIAGNOSIS — R062 Wheezing: Secondary | ICD-10-CM

## 2024-02-10 DIAGNOSIS — J069 Acute upper respiratory infection, unspecified: Secondary | ICD-10-CM

## 2024-02-10 HISTORY — DX: Malignant melanoma of skin, unspecified: C43.9

## 2024-02-10 MED ORDER — MONTELUKAST SODIUM 10 MG PO TABS: ORAL_TABLET | ORAL | 1 refills | Status: DC

## 2024-02-10 MED ORDER — ALBUTEROL SULFATE HFA 108 (90 BASE) MCG/ACT IN AERS: 2.0000 | INHALATION_SPRAY | Freq: Four times a day (QID) | RESPIRATORY_TRACT | 1 refills | Status: DC | PRN

## 2024-02-10 MED ORDER — AMOXICILLIN-POT CLAVULANATE 875-125 MG PO TABS: ORAL_TABLET | ORAL | 0 refills | Status: DC

## 2024-02-10 NOTE — Progress Notes (Signed)
  Because of the duration of your symptoms, I feel your condition warrants further evaluation and I recommend that you be seen in a face-to-face visit.   NOTE: There will be NO CHARGE for this E-Visit   If you are having a true medical emergency, please call 911.     For an urgent face to face visit, [facility] has multiple urgent care centers for your convenience.  Click the link below for the full list of locations and hours, walk-in wait times, appointment scheduling options and driving directions:  Urgent Care - [place], [place], [place], [place], [place], [place]  [place]     Your MyChart E-visit questionnaire answers were reviewed by a board certified advanced clinical practitioner to complete your personal care plan based on your specific symptoms.    Thank you for using e-Visits.

## 2024-02-10 NOTE — Discharge Instructions (Signed)
 Take plain guaifenesin (1200mg  extended release tabs such as Mucinex) twice daily, with plenty of water, for cough and congestion. Get adequate rest.   Try rotating antihistamine on a monthly basis (for example:  Zyrtec, then Allegra, then Claritin, then repeat).  If symptoms become significantly worse during the night or over the weekend, proceed to the local emergency room.

## 2024-02-10 NOTE — ED Triage Notes (Signed)
 Cough x months, comes and goes. Last on doxycycline  from e visit which was about 3 weeks ago. No fever. Has taken sudafed, mucinex, tesslon.

## 2024-02-10 NOTE — ED Provider Notes (Signed)
 [facility]    CSN: [id number] Arrival date & time: 02/10/24  1720      History   Chief Complaint Chief Complaint  Patient presents with  . Cough    HPI Justin Rasmussen is a 68 y.o. male.   Patient complains of an intermittent cough for several months.  He feels well, however, without shortness of breath, chest pain, increasing edema, or fevers, chills, and sweats.  He has a history of melanoma, CHF, OSA, cardiomyopathy, and seasonal allergic rhinitis.  He takes an antihistamine chronically for his allergies.   Review of records reveals that he was seen 11/16/23 for a complaint of wheezing.  Chest x-ray at that time revealed minimal right basilar subsegmental atelectasis, and he was treated with albuterol  inhaler.   Three weeks ago he was evaluated for a cough through an e-visit, diagnosed with bronchitis and prescribed a 7 day course of doxycycline  and Tessalon  perles as needed.  He states that he had minimal improvement and continued to cough afterwards. Review of a CT chest, abdomen, pelvis with contrast on 01/27/24 done for melanoma monitor revealed no acute findings or metastatic disease in chest/abdomen/pelvis.  However, mild bronchial wall thickening was reported.     The history is provided by the patient and the spouse.    Past Medical History:  Diagnosis Date  . 2019 novel coronavirus disease (COVID-19) 07/2019  . COPD (chronic obstructive pulmonary disease) (HCC)   . HFrEF (heart failure with reduced ejection fraction) (HCC)   . Melanoma (HCC)   . Nonischemic cardiomyopathy (HCC)   . OSA (obstructive sleep apnea)   . Pulmonary hypertension (HCC)    WHO group II    Patient Active Problem List   Diagnosis Date Noted  . Wheezing on auscultation 11/16/2023  . Acute midline low back pain without sciatica 11/16/2023  . OSA (obstructive sleep apnea) 07/04/2019  . Cor pulmonale (chronic) (HCC)   . Cardiomyopathy- etiology not yet determined 06/03/2019  .  Morbid obesity (HCC) 06/03/2019  . Elevated hemidiaphragm-Rt 06/03/2019  . RBBB 06/03/2019  . Acute systolic congestive heart failure (HCC) 06/01/2019    Past Surgical History:  Procedure Laterality Date  . ADENOIDECTOMY    . RIGHT/LEFT HEART CATH AND CORONARY ANGIOGRAPHY N/A 06/05/2019   Procedure: RIGHT/LEFT HEART CATH AND CORONARY ANGIOGRAPHY;  Surgeon: Lucendia Rusk, MD;  Location: [facility] INVASIVE CV LAB;  Service: Cardiovascular;  Laterality: N/A;       Home Medications    Prior to Admission medications   Medication Sig Start Date End Date Taking? Authorizing Provider  amoxicillin -clavulanate (AUGMENTIN ) 875-125 MG tablet Take one tab PO Q12hr with food 02/10/24  Yes Hesston Hitchens, Lenis Quin, MD  fexofenadine (ALLEGRA) 60 MG tablet Take 60 mg by mouth 2 (two) times daily.   Yes [provider]  montelukast  (SINGULAIR ) 10 MG tablet Take one tab PO QHS 02/10/24  Yes Chennel Olivos, Lenis Quin, MD  albuterol  (VENTOLIN  HFA) 108 (90 Base) MCG/ACT inhaler Inhale 2 puffs into the lungs every 6 (six) hours as needed for wheezing or shortness of breath. 02/10/24   Leon Rajas, MD  aspirin  81 MG chewable tablet Chew 1 tablet (81 mg total) by mouth daily. 06/08/19   Doroteo Gasmen, MD  benzonatate  (TESSALON ) 100 MG capsule Take 1 capsule (100 mg total) by mouth 3 (three) times daily as needed. 01/20/24   Mardene Shake, FNP  carvedilol  (COREG ) 6.25 MG tablet TAKE 1 TABLET BY MOUTH TWICE DAILY WITH A MEAL 04/11/23  Croitoru, Mihai, MD  cetirizine (ZYRTEC) 10 MG tablet Take 10 mg by mouth daily as needed for allergies or rhinitis.    [provider]  FARXIGA  10 MG TABS tablet TAKE 1 TABLET BY MOUTH ONCE DAILY BEFORE BREAKFAST 06/21/23   Croitoru, Mihai, MD  fluticasone  (FLONASE ) 50 MCG/ACT nasal spray Place 2 sprays into both nostrils daily. Patient not taking: Reported on 07/12/2023 11/19/19   Jaquita Merl P, DO  rosuvastatin  (CRESTOR ) 10 MG tablet Take 1 tablet by mouth once daily 04/11/23    Croitoru, Mihai, MD  sacubitril -valsartan  (ENTRESTO ) 97-103 MG Take 1 tablet by mouth twice daily 09/21/23   Croitoru, Mihai, MD  torsemide  (DEMADEX ) 20 MG tablet Take 1 tablet (20 mg total) by mouth as needed (Swelling). Patient not taking: Reported on 11/16/2023 11/10/23   Croitoru, Karyl Paget, MD    Family History Family History  Problem Relation Age of Onset  . Kidney disease Mother   . Diabetes Father   . Heart attack Father   . Diabetes Sister   . Heart disease Sister     Social History Social History   Tobacco Use  . Smoking status: Former    Current packs/day: 0.00    Average packs/day: 1 pack/day for 15.0 years (15.0 ttl pk-yrs)    Types: Cigarettes    Start date: 05/31/1974    Quit date: 05/31/1989    Years since quitting: 34.7  . Smokeless tobacco: Never  Vaping Use  . Vaping status: Never Used  Substance Use Topics  . Alcohol use: Not Currently  . Drug use: Never     Allergies   Patient has no known allergies.   Review of Systems Review of Systems   Physical Exam Triage Vital Signs ED Triage Vitals  Encounter Vitals Group     BP 02/10/24 1741 138/75     Systolic BP Percentile --      Diastolic BP Percentile --      Pulse Rate 02/10/24 1741 68     Resp 02/10/24 1741 20     Temp 02/10/24 1741 98.1 F (36.7 C)     Temp src --      SpO2 02/10/24 1741 90 %     Weight --      Height --      Head Circumference --      Peak Flow --      Pain Score 02/10/24 1745 0     Pain Loc --      Pain Education --      Exclude from Growth Chart --    No data found.  Updated Vital Signs BP 138/75   Pulse 68   Temp 98.1 F (36.7 C)   Resp 20   SpO2 90%   Visual Acuity Right Eye Distance:   Left Eye Distance:   Bilateral Distance:    Right Eye Near:   Left Eye Near:    Bilateral Near:     Physical Exam   UC Treatments / Results  Labs (all labs ordered are listed, but only abnormal results are displayed) Labs Reviewed - No data to  display  EKG   Radiology No results found.  Procedures Procedures (including critical care time)  Medications Ordered in UC Medications - No data to display  Initial Impression / Assessment and Plan / UC Course  I have reviewed the triage vital signs and the nursing notes.  Pertinent labs & imaging results that were available during my care of the patient were  reviewed by me and considered in my medical decision making (see chart for details).    Suspect cough a manifestation of mild reactive airways disease (note bronchial thickening on recent CT scan chest). Begin trial of Singulair .  Refill albuterol  inhaler. Augmentin  875 BID for five days. Followup with Family Doctor if not improved in one week.  Final Clinical Impressions(s) / UC Diagnoses   Final diagnoses:  Persistent cough for 3 weeks or longer  Seasonal allergic rhinitis, unspecified trigger     Discharge Instructions      Take plain guaifenesin (1200mg  extended release tabs such as Mucinex) twice daily, with plenty of water, for cough and congestion. Get adequate rest.   Try rotating antihistamine on a monthly basis (for example:  Zyrtec, then Allegra, then Claritin, then repeat).  If symptoms become significantly worse during the night or over the weekend, proceed to the local emergency room.    ED Prescriptions     Medication Sig Dispense Auth. Provider   albuterol  (VENTOLIN  HFA) 108 (90 Base) MCG/ACT inhaler Inhale 2 puffs into the lungs every 6 (six) hours as needed for wheezing or shortness of breath. 8 g Leon Rajas, MD   amoxicillin -clavulanate (AUGMENTIN ) 875-125 MG tablet Take one tab PO Q12hr with food 10 tablet Leon Rajas, MD   montelukast  (SINGULAIR ) 10 MG tablet Take one tab PO QHS 30 tablet Leon Rajas, MD

## 2024-02-17 ENCOUNTER — Encounter: Payer: Self-pay | Admitting: Cardiovascular Disease

## 2024-02-29 DIAGNOSIS — Z79899 Other long term (current) drug therapy: Secondary | ICD-10-CM | POA: Diagnosis not present

## 2024-02-29 DIAGNOSIS — C779 Secondary and unspecified malignant neoplasm of lymph node, unspecified: Secondary | ICD-10-CM | POA: Diagnosis not present

## 2024-02-29 DIAGNOSIS — J9611 Chronic respiratory failure with hypoxia: Secondary | ICD-10-CM | POA: Diagnosis not present

## 2024-02-29 DIAGNOSIS — Z9981 Dependence on supplemental oxygen: Secondary | ICD-10-CM | POA: Diagnosis not present

## 2024-02-29 DIAGNOSIS — C439 Malignant melanoma of skin, unspecified: Secondary | ICD-10-CM | POA: Diagnosis not present

## 2024-02-29 DIAGNOSIS — N1831 Chronic kidney disease, stage 3a: Secondary | ICD-10-CM | POA: Diagnosis not present

## 2024-02-29 DIAGNOSIS — C4371 Malignant melanoma of right lower limb, including hip: Secondary | ICD-10-CM | POA: Diagnosis not present

## 2024-02-29 DIAGNOSIS — Z5112 Encounter for antineoplastic immunotherapy: Secondary | ICD-10-CM | POA: Diagnosis not present

## 2024-03-12 DIAGNOSIS — G4733 Obstructive sleep apnea (adult) (pediatric): Secondary | ICD-10-CM | POA: Diagnosis not present

## 2024-03-24 DIAGNOSIS — G4733 Obstructive sleep apnea (adult) (pediatric): Secondary | ICD-10-CM | POA: Diagnosis not present

## 2024-03-27 ENCOUNTER — Other Ambulatory Visit: Payer: Self-pay | Admitting: Cardiovascular Disease

## 2024-03-28 DIAGNOSIS — N1831 Chronic kidney disease, stage 3a: Secondary | ICD-10-CM | POA: Diagnosis not present

## 2024-03-28 DIAGNOSIS — Z9981 Dependence on supplemental oxygen: Secondary | ICD-10-CM | POA: Diagnosis not present

## 2024-03-28 DIAGNOSIS — Z79899 Other long term (current) drug therapy: Secondary | ICD-10-CM | POA: Diagnosis not present

## 2024-03-28 DIAGNOSIS — C439 Malignant melanoma of skin, unspecified: Secondary | ICD-10-CM | POA: Diagnosis not present

## 2024-03-28 DIAGNOSIS — Z5112 Encounter for antineoplastic immunotherapy: Secondary | ICD-10-CM | POA: Diagnosis not present

## 2024-03-28 DIAGNOSIS — J9611 Chronic respiratory failure with hypoxia: Secondary | ICD-10-CM | POA: Diagnosis not present

## 2024-03-28 DIAGNOSIS — C779 Secondary and unspecified malignant neoplasm of lymph node, unspecified: Secondary | ICD-10-CM | POA: Diagnosis not present

## 2024-03-28 DIAGNOSIS — C4371 Malignant melanoma of right lower limb, including hip: Secondary | ICD-10-CM | POA: Diagnosis not present

## 2024-04-25 DIAGNOSIS — Z79899 Other long term (current) drug therapy: Secondary | ICD-10-CM | POA: Diagnosis not present

## 2024-04-25 DIAGNOSIS — C4370 Malignant melanoma of unspecified lower limb, including hip: Secondary | ICD-10-CM | POA: Diagnosis not present

## 2024-04-25 DIAGNOSIS — C439 Malignant melanoma of skin, unspecified: Secondary | ICD-10-CM | POA: Diagnosis not present

## 2024-04-25 DIAGNOSIS — Z5112 Encounter for antineoplastic immunotherapy: Secondary | ICD-10-CM | POA: Diagnosis not present

## 2024-04-25 DIAGNOSIS — D649 Anemia, unspecified: Secondary | ICD-10-CM | POA: Diagnosis not present

## 2024-05-23 DIAGNOSIS — Z9981 Dependence on supplemental oxygen: Secondary | ICD-10-CM | POA: Diagnosis not present

## 2024-05-23 DIAGNOSIS — N1831 Chronic kidney disease, stage 3a: Secondary | ICD-10-CM | POA: Diagnosis not present

## 2024-05-23 DIAGNOSIS — C439 Malignant melanoma of skin, unspecified: Secondary | ICD-10-CM | POA: Diagnosis not present

## 2024-05-23 DIAGNOSIS — I890 Lymphedema, not elsewhere classified: Secondary | ICD-10-CM | POA: Diagnosis not present

## 2024-05-23 DIAGNOSIS — Z5112 Encounter for antineoplastic immunotherapy: Secondary | ICD-10-CM | POA: Diagnosis not present

## 2024-05-23 DIAGNOSIS — Z79899 Other long term (current) drug therapy: Secondary | ICD-10-CM | POA: Diagnosis not present

## 2024-05-23 DIAGNOSIS — J9611 Chronic respiratory failure with hypoxia: Secondary | ICD-10-CM | POA: Diagnosis not present

## 2024-05-23 DIAGNOSIS — C4371 Malignant melanoma of right lower limb, including hip: Secondary | ICD-10-CM | POA: Diagnosis not present

## 2024-05-23 DIAGNOSIS — C779 Secondary and unspecified malignant neoplasm of lymph node, unspecified: Secondary | ICD-10-CM | POA: Diagnosis not present

## 2024-05-28 DIAGNOSIS — I890 Lymphedema, not elsewhere classified: Secondary | ICD-10-CM | POA: Diagnosis not present

## 2024-05-28 DIAGNOSIS — R29898 Other symptoms and signs involving the musculoskeletal system: Secondary | ICD-10-CM | POA: Diagnosis not present

## 2024-05-28 DIAGNOSIS — M79604 Pain in right leg: Secondary | ICD-10-CM | POA: Diagnosis not present

## 2024-06-01 DIAGNOSIS — I890 Lymphedema, not elsewhere classified: Secondary | ICD-10-CM | POA: Diagnosis not present

## 2024-06-01 DIAGNOSIS — R29898 Other symptoms and signs involving the musculoskeletal system: Secondary | ICD-10-CM | POA: Diagnosis not present

## 2024-06-01 NOTE — Progress Notes (Signed)
 Physical Therapy Visit - Daily Note   Payor: BLUEMEDICARE / Plan: BLUEMEDICARE / Product Type: Medicare Advantage /   Visit Count: 2   Rehabilitation Precautions/Restrictions:   Precautions/Restrictions Precautions: 68 y.o. with a Stage IIIC (pT4b pN2a M0) acral melanoma right foot 06/2023.      NED s/p WLE (with positive SLNB with ECE) and infusion 10   nivolumab       AKI on CKD - improved       This is in the setting of chronic respiratory failure on O2 prn, cor pulmonale / right heart failure, and pulmonary artery hypertension. BMI >40. Restrictions: none      Referring Diagnosis: I89.0 - Lymphedema   SUBJECTIVE  Patient Report:   Pt denies any shortness of breath recently.   Change in Status Since Last Visit: No   Pain:   Pt denies any lower extremity pain at this time.   OBJECTIVE   General Observation/Objective Measures:     No skin breakdown noted and skin appears to be in good condition.  Pt states he has been putting Aquaphor on his feet and another lotion on his legs.        Interventions:   Manual therapy x 33 minutes for reducing lymphatic fluid build-up:   Manual lymphatic drainage along bilateral lower extremities with majority of time spent on the  right lower extremity.  Pt seated in arm chair throughout.     Therapeutic exercises x 9 minutes for further lower extremity strengthening and pumping of fluid via muscle action:   Seated hip flexion (marching), long arc quads, and ankle pumps performed sequentially for lymphatic pumping via muscle compression action.  Verbal cueing provided for appropriate performance and sequencing.    Education: Educated pt on importance of obtaining an appropriate compression sock or wrap for long-term management.  He was introduced to some options on the [employer] site and asked to think about what may be his preference.   ASSESSMENT  Pt demonstrated good performance of pumping exercises, but did require verbal  reminders for appropriate performance.  Indentation noted along right foot from the shoe, so pt was advised to try removing the insole or gently snipping the elastic band along the tongue of the shoe to reduce pressure along the top of his right foot.    Therapy Diagnosis:     ICD-10-CM   1. Lymphedema  I89.0   2. Leg heaviness  R29.898     Progress Towards Goals:     Goals Addressed             This Visit's Progress   . PT Goal       Short term goals to be completed by 4 visits:    Have pt independent in a home exercise program of pumping exercises to be performed sequentially to further aide in reduction on lymphatic fluid build-up.     2.  Pt to exhibit reduction in circumferential measurements by .25 cm or greater at 3 locations or more on his lower extremities.    3.  Pt to have good understanding of basic skin care to minimize further lymphatic system compromise.    Long term goals to be completed by 24 visits:  4.  Pt to exhibit reduction in circumferential measurements by .75 cm to 1.5 cm at all locations.     5.  Pt to have good understanding of long-term management of lymphedema.     6.  Pt to have a compression garment (sock,  stocking, [place] wrap, etc) for long-term management OR have the necessary information for pursuing same if they do not wish to do so yet.    7.  Pt to demonstrate improvement as noted by reduced impairment on the lymphedema lifestyle impact scale from 23.33% to 15% or less.          PLAN Treatment Frequency and Duration:  PT Frequency: 2x/week; 1x/week PT Duration: Other (comment) Treatment Plan Details: 2-1x/week x 24 visits  Recommended PT Treatment/Interventions: Neuromuscular re-education (02887); Self-care/home management [phone]); Therapeutic activity (97530); Manual therapy (02859)   Recommended Consults:  None currently  Development of Plan of Care:  No change in POC.  Total Treatment Time (Time & Untimed): Total  Treatment Time: 42 Total Time in Timed Codes: Time in Timed Codes: 42     Treatment/Procedures Manual Therapy Tech, 1 + regions minutes: 33 Therapeutic Exercises minutes: 9             The patient has been instructed to contact our clinic if any questions or problems should arise.

## 2024-06-02 ENCOUNTER — Inpatient Hospital Stay (HOSPITAL_COMMUNITY)
Admission: EM | Admit: 2024-06-02 | Discharge: 2024-06-22 | DRG: 023 | Disposition: A | Discharge status: Rehabilitation Facility | Attending: Neurology | Admitting: Neurology

## 2024-06-02 ENCOUNTER — Emergency Department (HOSPITAL_COMMUNITY)

## 2024-06-02 ENCOUNTER — Encounter (HOSPITAL_COMMUNITY): Payer: Self-pay | Admitting: Neurology

## 2024-06-02 ENCOUNTER — Inpatient Hospital Stay (HOSPITAL_COMMUNITY)

## 2024-06-02 DIAGNOSIS — S06320A Contusion and laceration of left cerebrum without loss of consciousness, initial encounter: Secondary | ICD-10-CM | POA: Diagnosis not present

## 2024-06-02 DIAGNOSIS — E1122 Type 2 diabetes mellitus with diabetic chronic kidney disease: Secondary | ICD-10-CM | POA: Diagnosis present

## 2024-06-02 DIAGNOSIS — N179 Acute kidney failure, unspecified: Secondary | ICD-10-CM | POA: Diagnosis not present

## 2024-06-02 DIAGNOSIS — I428 Other cardiomyopathies: Secondary | ICD-10-CM | POA: Diagnosis not present

## 2024-06-02 DIAGNOSIS — R29708 NIHSS score 8: Secondary | ICD-10-CM | POA: Diagnosis not present

## 2024-06-02 DIAGNOSIS — I672 Cerebral atherosclerosis: Secondary | ICD-10-CM | POA: Diagnosis not present

## 2024-06-02 DIAGNOSIS — N1831 Chronic kidney disease, stage 3a: Secondary | ICD-10-CM | POA: Diagnosis not present

## 2024-06-02 DIAGNOSIS — R29810 Facial weakness: Secondary | ICD-10-CM | POA: Diagnosis present

## 2024-06-02 DIAGNOSIS — G935 Compression of brain: Secondary | ICD-10-CM | POA: Diagnosis present

## 2024-06-02 DIAGNOSIS — Z833 Family history of diabetes mellitus: Secondary | ICD-10-CM

## 2024-06-02 DIAGNOSIS — R569 Unspecified convulsions: Secondary | ICD-10-CM | POA: Diagnosis not present

## 2024-06-02 DIAGNOSIS — G4733 Obstructive sleep apnea (adult) (pediatric): Secondary | ICD-10-CM | POA: Diagnosis present

## 2024-06-02 DIAGNOSIS — R471 Dysarthria and anarthria: Secondary | ICD-10-CM | POA: Diagnosis present

## 2024-06-02 DIAGNOSIS — R29723 NIHSS score 23: Secondary | ICD-10-CM | POA: Diagnosis not present

## 2024-06-02 DIAGNOSIS — I959 Hypotension, unspecified: Secondary | ICD-10-CM | POA: Diagnosis not present

## 2024-06-02 DIAGNOSIS — J9811 Atelectasis: Secondary | ICD-10-CM | POA: Diagnosis not present

## 2024-06-02 DIAGNOSIS — R4781 Slurred speech: Secondary | ICD-10-CM | POA: Diagnosis not present

## 2024-06-02 DIAGNOSIS — I110 Hypertensive heart disease with heart failure: Secondary | ICD-10-CM | POA: Diagnosis not present

## 2024-06-02 DIAGNOSIS — C439 Malignant melanoma of skin, unspecified: Secondary | ICD-10-CM | POA: Diagnosis present

## 2024-06-02 DIAGNOSIS — E7800 Pure hypercholesterolemia, unspecified: Secondary | ICD-10-CM | POA: Diagnosis present

## 2024-06-02 DIAGNOSIS — Z87891 Personal history of nicotine dependence: Secondary | ICD-10-CM

## 2024-06-02 DIAGNOSIS — J9602 Acute respiratory failure with hypercapnia: Secondary | ICD-10-CM | POA: Diagnosis not present

## 2024-06-02 DIAGNOSIS — Z85820 Personal history of malignant melanoma of skin: Secondary | ICD-10-CM | POA: Diagnosis not present

## 2024-06-02 DIAGNOSIS — E871 Hypo-osmolality and hyponatremia: Secondary | ICD-10-CM | POA: Diagnosis present

## 2024-06-02 DIAGNOSIS — G8191 Hemiplegia, unspecified affecting right dominant side: Secondary | ICD-10-CM | POA: Diagnosis present

## 2024-06-02 DIAGNOSIS — Z8616 Personal history of COVID-19: Secondary | ICD-10-CM | POA: Diagnosis not present

## 2024-06-02 DIAGNOSIS — I5020 Unspecified systolic (congestive) heart failure: Secondary | ICD-10-CM | POA: Diagnosis not present

## 2024-06-02 DIAGNOSIS — I629 Nontraumatic intracranial hemorrhage, unspecified: Principal | ICD-10-CM

## 2024-06-02 DIAGNOSIS — R9389 Abnormal findings on diagnostic imaging of other specified body structures: Secondary | ICD-10-CM | POA: Diagnosis not present

## 2024-06-02 DIAGNOSIS — I611 Nontraumatic intracerebral hemorrhage in hemisphere, cortical: Secondary | ICD-10-CM | POA: Diagnosis not present

## 2024-06-02 DIAGNOSIS — R0989 Other specified symptoms and signs involving the circulatory and respiratory systems: Secondary | ICD-10-CM | POA: Diagnosis not present

## 2024-06-02 DIAGNOSIS — J9620 Acute and chronic respiratory failure, unspecified whether with hypoxia or hypercapnia: Secondary | ICD-10-CM | POA: Diagnosis not present

## 2024-06-02 DIAGNOSIS — I5022 Chronic systolic (congestive) heart failure: Secondary | ICD-10-CM

## 2024-06-02 DIAGNOSIS — I2729 Other secondary pulmonary hypertension: Secondary | ICD-10-CM | POA: Diagnosis not present

## 2024-06-02 DIAGNOSIS — Z4682 Encounter for fitting and adjustment of non-vascular catheter: Secondary | ICD-10-CM | POA: Diagnosis not present

## 2024-06-02 DIAGNOSIS — Z6841 Body Mass Index (BMI) 40.0 and over, adult: Secondary | ICD-10-CM | POA: Diagnosis not present

## 2024-06-02 DIAGNOSIS — Z66 Do not resuscitate: Secondary | ICD-10-CM | POA: Diagnosis present

## 2024-06-02 DIAGNOSIS — Z79899 Other long term (current) drug therapy: Secondary | ICD-10-CM

## 2024-06-02 DIAGNOSIS — R1310 Dysphagia, unspecified: Secondary | ICD-10-CM | POA: Diagnosis not present

## 2024-06-02 DIAGNOSIS — G932 Benign intracranial hypertension: Secondary | ICD-10-CM | POA: Diagnosis not present

## 2024-06-02 DIAGNOSIS — Z1152 Encounter for screening for COVID-19: Secondary | ICD-10-CM | POA: Diagnosis not present

## 2024-06-02 DIAGNOSIS — I69191 Dysphagia following nontraumatic intracerebral hemorrhage: Secondary | ICD-10-CM | POA: Diagnosis not present

## 2024-06-02 DIAGNOSIS — M898X6 Other specified disorders of bone, lower leg: Secondary | ICD-10-CM | POA: Diagnosis not present

## 2024-06-02 DIAGNOSIS — R404 Transient alteration of awareness: Secondary | ICD-10-CM | POA: Diagnosis not present

## 2024-06-02 DIAGNOSIS — I5042 Chronic combined systolic (congestive) and diastolic (congestive) heart failure: Secondary | ICD-10-CM | POA: Diagnosis present

## 2024-06-02 DIAGNOSIS — Z7982 Long term (current) use of aspirin: Secondary | ICD-10-CM

## 2024-06-02 DIAGNOSIS — I615 Nontraumatic intracerebral hemorrhage, intraventricular: Secondary | ICD-10-CM | POA: Diagnosis not present

## 2024-06-02 DIAGNOSIS — I5030 Unspecified diastolic (congestive) heart failure: Secondary | ICD-10-CM | POA: Diagnosis not present

## 2024-06-02 DIAGNOSIS — G939 Disorder of brain, unspecified: Secondary | ICD-10-CM | POA: Diagnosis not present

## 2024-06-02 DIAGNOSIS — R29898 Other symptoms and signs involving the musculoskeletal system: Secondary | ICD-10-CM | POA: Diagnosis not present

## 2024-06-02 DIAGNOSIS — D696 Thrombocytopenia, unspecified: Secondary | ICD-10-CM | POA: Diagnosis not present

## 2024-06-02 DIAGNOSIS — E785 Hyperlipidemia, unspecified: Secondary | ICD-10-CM | POA: Diagnosis not present

## 2024-06-02 DIAGNOSIS — A419 Sepsis, unspecified organism: Secondary | ICD-10-CM | POA: Diagnosis not present

## 2024-06-02 DIAGNOSIS — R918 Other nonspecific abnormal finding of lung field: Secondary | ICD-10-CM | POA: Diagnosis not present

## 2024-06-02 DIAGNOSIS — E870 Hyperosmolality and hypernatremia: Secondary | ICD-10-CM | POA: Insufficient documentation

## 2024-06-02 DIAGNOSIS — J9601 Acute respiratory failure with hypoxia: Secondary | ICD-10-CM | POA: Diagnosis not present

## 2024-06-02 DIAGNOSIS — G9341 Metabolic encephalopathy: Secondary | ICD-10-CM | POA: Diagnosis not present

## 2024-06-02 DIAGNOSIS — Z7401 Bed confinement status: Secondary | ICD-10-CM | POA: Diagnosis not present

## 2024-06-02 DIAGNOSIS — J986 Disorders of diaphragm: Secondary | ICD-10-CM | POA: Diagnosis present

## 2024-06-02 DIAGNOSIS — G936 Cerebral edema: Secondary | ICD-10-CM | POA: Diagnosis present

## 2024-06-02 DIAGNOSIS — F419 Anxiety disorder, unspecified: Secondary | ICD-10-CM | POA: Diagnosis present

## 2024-06-02 DIAGNOSIS — E860 Dehydration: Secondary | ICD-10-CM | POA: Diagnosis present

## 2024-06-02 DIAGNOSIS — R031 Nonspecific low blood-pressure reading: Secondary | ICD-10-CM | POA: Diagnosis not present

## 2024-06-02 DIAGNOSIS — R6521 Severe sepsis with septic shock: Secondary | ICD-10-CM | POA: Diagnosis not present

## 2024-06-02 DIAGNOSIS — C801 Malignant (primary) neoplasm, unspecified: Secondary | ICD-10-CM | POA: Diagnosis not present

## 2024-06-02 DIAGNOSIS — R221 Localized swelling, mass and lump, neck: Secondary | ICD-10-CM | POA: Diagnosis not present

## 2024-06-02 DIAGNOSIS — R29707 NIHSS score 7: Secondary | ICD-10-CM | POA: Diagnosis not present

## 2024-06-02 DIAGNOSIS — R220 Localized swelling, mass and lump, head: Secondary | ICD-10-CM | POA: Diagnosis not present

## 2024-06-02 DIAGNOSIS — J440 Chronic obstructive pulmonary disease with acute lower respiratory infection: Secondary | ICD-10-CM | POA: Diagnosis not present

## 2024-06-02 DIAGNOSIS — I6523 Occlusion and stenosis of bilateral carotid arteries: Secondary | ICD-10-CM | POA: Diagnosis not present

## 2024-06-02 DIAGNOSIS — H4921 Sixth [abducent] nerve palsy, right eye: Secondary | ICD-10-CM | POA: Diagnosis present

## 2024-06-02 DIAGNOSIS — I5021 Acute systolic (congestive) heart failure: Secondary | ICD-10-CM | POA: Diagnosis not present

## 2024-06-02 DIAGNOSIS — D332 Benign neoplasm of brain, unspecified: Secondary | ICD-10-CM

## 2024-06-02 DIAGNOSIS — J690 Pneumonitis due to inhalation of food and vomit: Secondary | ICD-10-CM | POA: Diagnosis not present

## 2024-06-02 DIAGNOSIS — I69120 Aphasia following nontraumatic intracerebral hemorrhage: Secondary | ICD-10-CM | POA: Diagnosis not present

## 2024-06-02 DIAGNOSIS — Z9981 Dependence on supplemental oxygen: Secondary | ICD-10-CM

## 2024-06-02 DIAGNOSIS — Z8249 Family history of ischemic heart disease and other diseases of the circulatory system: Secondary | ICD-10-CM

## 2024-06-02 DIAGNOSIS — I272 Pulmonary hypertension, unspecified: Secondary | ICD-10-CM | POA: Diagnosis not present

## 2024-06-02 DIAGNOSIS — E119 Type 2 diabetes mellitus without complications: Secondary | ICD-10-CM | POA: Diagnosis not present

## 2024-06-02 DIAGNOSIS — Z8701 Personal history of pneumonia (recurrent): Secondary | ICD-10-CM | POA: Diagnosis not present

## 2024-06-02 DIAGNOSIS — J449 Chronic obstructive pulmonary disease, unspecified: Secondary | ICD-10-CM | POA: Diagnosis not present

## 2024-06-02 DIAGNOSIS — I10 Essential (primary) hypertension: Secondary | ICD-10-CM | POA: Diagnosis not present

## 2024-06-02 DIAGNOSIS — C7931 Secondary malignant neoplasm of brain: Secondary | ICD-10-CM | POA: Diagnosis not present

## 2024-06-02 DIAGNOSIS — I6200 Nontraumatic subdural hemorrhage, unspecified: Secondary | ICD-10-CM | POA: Diagnosis not present

## 2024-06-02 DIAGNOSIS — E66813 Obesity, class 3: Secondary | ICD-10-CM | POA: Diagnosis present

## 2024-06-02 DIAGNOSIS — Z51A Encounter for sepsis aftercare: Secondary | ICD-10-CM | POA: Diagnosis not present

## 2024-06-02 DIAGNOSIS — I609 Nontraumatic subarachnoid hemorrhage, unspecified: Secondary | ICD-10-CM | POA: Diagnosis not present

## 2024-06-02 DIAGNOSIS — R001 Bradycardia, unspecified: Secondary | ICD-10-CM | POA: Diagnosis not present

## 2024-06-02 DIAGNOSIS — G9389 Other specified disorders of brain: Secondary | ICD-10-CM | POA: Diagnosis not present

## 2024-06-02 DIAGNOSIS — R600 Localized edema: Secondary | ICD-10-CM | POA: Diagnosis not present

## 2024-06-02 DIAGNOSIS — R58 Hemorrhage, not elsewhere classified: Secondary | ICD-10-CM | POA: Diagnosis not present

## 2024-06-02 DIAGNOSIS — I509 Heart failure, unspecified: Secondary | ICD-10-CM | POA: Diagnosis not present

## 2024-06-02 DIAGNOSIS — E878 Other disorders of electrolyte and fluid balance, not elsewhere classified: Secondary | ICD-10-CM | POA: Diagnosis not present

## 2024-06-02 DIAGNOSIS — D496 Neoplasm of unspecified behavior of brain: Secondary | ICD-10-CM | POA: Diagnosis not present

## 2024-06-02 DIAGNOSIS — I619 Nontraumatic intracerebral hemorrhage, unspecified: Principal | ICD-10-CM | POA: Diagnosis present

## 2024-06-02 DIAGNOSIS — R1311 Dysphagia, oral phase: Secondary | ICD-10-CM | POA: Diagnosis not present

## 2024-06-02 DIAGNOSIS — R4701 Aphasia: Secondary | ICD-10-CM | POA: Diagnosis not present

## 2024-06-02 DIAGNOSIS — R7303 Prediabetes: Secondary | ICD-10-CM | POA: Insufficient documentation

## 2024-06-02 DIAGNOSIS — I5032 Chronic diastolic (congestive) heart failure: Secondary | ICD-10-CM | POA: Insufficient documentation

## 2024-06-02 DIAGNOSIS — Z8419 Family history of other disorders of kidney and ureter: Secondary | ICD-10-CM

## 2024-06-02 DIAGNOSIS — R29709 NIHSS score 9: Secondary | ICD-10-CM | POA: Diagnosis not present

## 2024-06-02 DIAGNOSIS — Z794 Long term (current) use of insulin: Secondary | ICD-10-CM | POA: Diagnosis not present

## 2024-06-02 DIAGNOSIS — E876 Hypokalemia: Secondary | ICD-10-CM | POA: Diagnosis not present

## 2024-06-02 DIAGNOSIS — I130 Hypertensive heart and chronic kidney disease with heart failure and stage 1 through stage 4 chronic kidney disease, or unspecified chronic kidney disease: Secondary | ICD-10-CM | POA: Diagnosis not present

## 2024-06-02 DIAGNOSIS — R4182 Altered mental status, unspecified: Secondary | ICD-10-CM | POA: Diagnosis not present

## 2024-06-02 DIAGNOSIS — I69151 Hemiplegia and hemiparesis following nontraumatic intracerebral hemorrhage affecting right dominant side: Secondary | ICD-10-CM | POA: Diagnosis not present

## 2024-06-02 LAB — HEMOGLOBIN A1C
Hgb A1c MFr Bld: 5.5 % (ref 4.8–5.6)
Mean Plasma Glucose: 111.15 mg/dL

## 2024-06-02 LAB — CBC
HCT: 39.9 % (ref 39.0–52.0)
Hemoglobin: 12.3 g/dL — ABNORMAL LOW (ref 13.0–17.0)
MCH: 25.7 pg — ABNORMAL LOW (ref 26.0–34.0)
MCHC: 30.8 g/dL (ref 30.0–36.0)
MCV: 83.5 fL (ref 80.0–100.0)
Platelets: 342 K/uL (ref 150–400)
RBC: 4.78 MIL/uL (ref 4.22–5.81)
RDW: 15.7 % — ABNORMAL HIGH (ref 11.5–15.5)
WBC: 8.7 K/uL (ref 4.0–10.5)
nRBC: 0 % (ref 0.0–0.2)

## 2024-06-02 LAB — DIFFERENTIAL
Abs Immature Granulocytes: 0.03 K/uL (ref 0.00–0.07)
Basophils Absolute: 0 K/uL (ref 0.0–0.1)
Basophils Relative: 0 %
Eosinophils Absolute: 0.1 K/uL (ref 0.0–0.5)
Eosinophils Relative: 1 %
Immature Granulocytes: 0 %
Lymphocytes Relative: 11 %
Lymphs Abs: 0.9 K/uL (ref 0.7–4.0)
Monocytes Absolute: 0.6 K/uL (ref 0.1–1.0)
Monocytes Relative: 7 %
Neutro Abs: 7 K/uL (ref 1.7–7.7)
Neutrophils Relative %: 81 %

## 2024-06-02 LAB — COMPREHENSIVE METABOLIC PANEL WITH GFR
ALT: 13 U/L (ref 0–44)
AST: 21 U/L (ref 15–41)
Albumin: 3.5 g/dL (ref 3.5–5.0)
Alkaline Phosphatase: 46 U/L (ref 38–126)
Anion gap: 14 (ref 5–15)
BUN: 16 mg/dL (ref 8–23)
CO2: 27 mmol/L (ref 22–32)
Calcium: 8.9 mg/dL (ref 8.9–10.3)
Chloride: 99 mmol/L (ref 98–111)
Creatinine, Ser: 1.26 mg/dL — ABNORMAL HIGH (ref 0.61–1.24)
GFR, Estimated: >60 mL/min (ref >60)
Glucose, Bld: 127 mg/dL — ABNORMAL HIGH (ref 70–99)
Potassium: 3.8 mmol/L (ref 3.5–5.1)
Sodium: 140 mmol/L (ref 135–145)
Total Bilirubin: 0.7 mg/dL (ref 0.0–1.2)
Total Protein: 7.1 g/dL (ref 6.5–8.1)

## 2024-06-02 LAB — I-STAT CHEM 8, ED
BUN: 17 mg/dL (ref 8–23)
Calcium, Ion: 1.09 mmol/L — ABNORMAL LOW (ref 1.15–1.40)
Chloride: 100 mmol/L (ref 98–111)
Creatinine, Ser: 1.3 mg/dL — ABNORMAL HIGH (ref 0.61–1.24)
Glucose, Bld: 130 mg/dL — ABNORMAL HIGH (ref 70–99)
HCT: 38 % — ABNORMAL LOW (ref 39.0–52.0)
Hemoglobin: 12.9 g/dL — ABNORMAL LOW (ref 13.0–17.0)
Potassium: 3.8 mmol/L (ref 3.5–5.1)
Sodium: 141 mmol/L (ref 135–145)
TCO2: 29 mmol/L (ref 22–32)

## 2024-06-02 LAB — LIPID PANEL
Cholesterol: 141 mg/dL (ref 0–200)
HDL: 28 mg/dL — ABNORMAL LOW (ref >40)
LDL Cholesterol: 93 mg/dL (ref 0–99)
Total CHOL/HDL Ratio: 5 ratio
Triglycerides: 98 mg/dL (ref <150)
VLDL: 20 mg/dL (ref 0–40)

## 2024-06-02 LAB — APTT: aPTT: 38 s — ABNORMAL HIGH (ref 24–36)

## 2024-06-02 LAB — CBG MONITORING, ED: Glucose-Capillary: 124 mg/dL — ABNORMAL HIGH (ref 70–99)

## 2024-06-02 LAB — PROTIME-INR
INR: 1.2 (ref 0.8–1.2)
Prothrombin Time: 15.7 s — ABNORMAL HIGH (ref 11.4–15.2)

## 2024-06-02 LAB — GLUCOSE, CAPILLARY: Glucose-Capillary: 141 mg/dL — ABNORMAL HIGH (ref 70–99)

## 2024-06-02 LAB — ETHANOL: Alcohol, Ethyl (B): <15 mg/dL (ref <15)

## 2024-06-02 LAB — MRSA NEXT GEN BY PCR, NASAL: MRSA by PCR Next Gen: DETECTED — AB

## 2024-06-02 MED ORDER — CARVEDILOL 3.125 MG PO TABS
6.2500 mg | ORAL_TABLET | Freq: Two times a day (BID) | ORAL | Status: DC
  Administered 2024-06-03: 6.25 mg via ORAL
  Filled 2024-06-02 (×2): qty 2

## 2024-06-02 MED ORDER — STROKE: EARLY STAGES OF RECOVERY BOOK
Freq: Once | Status: AC
  Filled 2024-06-02: qty 1

## 2024-06-02 MED ORDER — ACETAMINOPHEN 650 MG RE SUPP: 650.0000 mg | RECTAL | Status: DC | PRN

## 2024-06-02 MED ORDER — SENNOSIDES-DOCUSATE SODIUM 8.6-50 MG PO TABS
1.0000 | ORAL_TABLET | Freq: Two times a day (BID) | ORAL | Status: DC
  Administered 2024-06-03 – 2024-06-04 (×4): 1 via ORAL
  Filled 2024-06-02 (×4): qty 1

## 2024-06-02 MED ORDER — SODIUM CHLORIDE 0.9% FLUSH
3.0000 mL | Freq: Once | INTRAVENOUS | Status: AC
  Administered 2024-06-02: 3 mL via INTRAVENOUS

## 2024-06-02 MED ORDER — IOHEXOL 350 MG/ML SOLN
75.0000 mL | Freq: Once | INTRAVENOUS | Status: AC | PRN
Start: 2024-06-02 — End: 2024-06-02
  Administered 2024-06-02: 75 mL via INTRAVENOUS

## 2024-06-02 MED ORDER — DOCUSATE SODIUM 100 MG PO CAPS: 100.0000 mg | ORAL_CAPSULE | Freq: Two times a day (BID) | ORAL | Status: DC | PRN

## 2024-06-02 MED ORDER — CHLORHEXIDINE GLUCONATE CLOTH 2 % EX PADS
6.0000 | MEDICATED_PAD | Freq: Every day | CUTANEOUS | Status: DC
  Administered 2024-06-02 – 2024-06-18 (×16): 6 via TOPICAL

## 2024-06-02 MED ORDER — LEVETIRACETAM (KEPPRA) 500 MG/5 ML ADULT IV PUSH
500.0000 mg | Freq: Two times a day (BID) | INTRAVENOUS | Status: DC
  Administered 2024-06-02 – 2024-06-06 (×7): 500 mg via INTRAVENOUS
  Filled 2024-06-02 (×7): qty 5

## 2024-06-02 MED ORDER — POLYETHYLENE GLYCOL 3350 17 G PO PACK: 17.0000 g | PACK | Freq: Every day | ORAL | Status: DC | PRN

## 2024-06-02 MED ORDER — PANTOPRAZOLE SODIUM 40 MG IV SOLR
40.0000 mg | Freq: Every day | INTRAVENOUS | Status: DC
  Administered 2024-06-02 – 2024-06-20 (×19): 40 mg via INTRAVENOUS
  Filled 2024-06-02 (×19): qty 10

## 2024-06-02 MED ORDER — SODIUM CHLORIDE 0.9 % IV SOLN: INTRAVENOUS | Status: DC

## 2024-06-02 MED ORDER — NICARDIPINE HCL IN NACL 20-0.86 MG/200ML-% IV SOLN
0.0000 mg/h | INTRAVENOUS | Status: DC | PRN
  Administered 2024-06-06: 10 mg/h via INTRAVENOUS
  Administered 2024-06-06: 5 mg/h via INTRAVENOUS
  Administered 2024-06-06: 10 mg/h via INTRAVENOUS
  Filled 2024-06-02: qty 200
  Filled 2024-06-02: qty 400

## 2024-06-02 MED ORDER — INSULIN ASPART 100 UNIT/ML IJ SOLN
0.0000 [IU] | INTRAMUSCULAR | Status: DC
  Administered 2024-06-02: 2 [IU] via SUBCUTANEOUS
  Administered 2024-06-03 (×5): 3 [IU] via SUBCUTANEOUS

## 2024-06-02 MED ORDER — METOCLOPRAMIDE HCL 5 MG/ML IJ SOLN
5.0000 mg | Freq: Once | INTRAMUSCULAR | Status: AC
  Administered 2024-06-02: 5 mg via INTRAVENOUS
  Filled 2024-06-02: qty 2

## 2024-06-02 MED ORDER — ACETAMINOPHEN 325 MG PO TABS
650.0000 mg | ORAL_TABLET | ORAL | Status: DC | PRN
  Administered 2024-06-15 – 2024-06-19 (×3): 650 mg via ORAL
  Filled 2024-06-02 (×3): qty 2

## 2024-06-02 MED ORDER — IPRATROPIUM-ALBUTEROL 0.5-2.5 (3) MG/3ML IN SOLN: 3.0000 mL | RESPIRATORY_TRACT | Status: DC | PRN

## 2024-06-02 MED ORDER — ACETAMINOPHEN 160 MG/5ML PO SOLN
650.0000 mg | ORAL | Status: DC | PRN
  Administered 2024-06-08 – 2024-06-10 (×4): 650 mg
  Filled 2024-06-02 (×5): qty 20.3

## 2024-06-02 MED ORDER — MONTELUKAST SODIUM 10 MG PO TABS: 10.0000 mg | ORAL_TABLET | Freq: Every day | ORAL | Status: DC

## 2024-06-02 NOTE — ED Triage Notes (Signed)
 Pt here POV with reports of waking up this morning not acting like himself. Family noticed slurred speech and fatigue. Upon assessment, pt with no unilateral weakness. Pt does have slurred speech and expressive aphasia.

## 2024-06-02 NOTE — ED Notes (Signed)
 Patient report given to [facility], RN

## 2024-06-02 NOTE — H&P (Signed)
 NEUROLOGY CONSULT NOTE   Date of service: June 02, 2024 Patient Name: Justin Rasmussen MRN:  [id number] DOB:  11-Oct-1955 Chief Complaint: Aphasia Requesting Provider: Michaela Justin Rasmussen, *  History of Present Illness  Justin Rasmussen is a 68 y.o. male with hx of melanoma currently under treatment with immunotherapy(nivolumab ) who presents with right-sided weakness and aphasia.  He was in his normal state of health at 9 AM, but then around noon his wife noticed that he was not speaking much.  He was initially very resistant to coming into getting checked out.  After he finally was convinced to come in and get checked out, it was noted that he had right-sided weakness with aphasia and a code stroke was activated. he was taken to the CT scanner where a CT was performed demonstrating intraparenchymal hematoma in the left frontal region  LKW: 9 AM IV Thrombolysis: No, ICH ICH Score: 1  NIHSS components Score: Comment  1a Level of Conscious 0[x]  1[]  2[]  3[]      1b LOC Questions 0[]  1[]  2[x]       1c LOC Commands 0[x]  1[]  2[]       2 Best Gaze 0[x]  1[]  2[]       3 Visual 0[x]  1[]  2[]  3[]      4 Facial Palsy 0[]  1[x]  2[]  3[]      5a Motor Arm - left 0[x]  1[]  2[]  3[]  4[]  UN[]    5b Motor Arm - Right 0[]  1[x]  2[]  3[]  4[]  UN[]    6a Motor Leg - Left 0[x]  1[]  2[]  3[]  4[]  UN[]    6b Motor Leg - Right 0[]  1[x]  2[]  3[]  4[]  UN[]    7 Limb Ataxia 0[x]  1[]  2[]  UN[]      8 Sensory 0[x]  1[]  2[]  UN[]      9 Best Language 0[]  1[]  2[x]  3[]      10 Dysarthria 0[]  1[x]  2[]  UN[]      11 Extinct. and Inattention 0[x]  1[]  2[]       TOTAL:  8     Past History   Past Medical History:  Diagnosis Date   2019 novel coronavirus disease (COVID-19) 07/2019   COPD (chronic obstructive pulmonary disease) (HCC)    HFrEF (heart failure with reduced ejection fraction) (HCC)    Melanoma (HCC)    Nonischemic cardiomyopathy (HCC)    OSA (obstructive sleep apnea)    Pulmonary hypertension (HCC)    WHO group II    Past  Surgical History:  Procedure Laterality Date   ADENOIDECTOMY     RIGHT/LEFT HEART CATH AND CORONARY ANGIOGRAPHY N/A 06/05/2019   Procedure: RIGHT/LEFT HEART CATH AND CORONARY ANGIOGRAPHY;  Surgeon: Dann Candyce RAMAN, MD;  Location: [facility];  Service: Cardiovascular;  Laterality: N/A;    Family History: Family History  Problem Relation Age of Onset   Kidney disease Mother    Diabetes Father    Heart attack Father    Diabetes Sister    Heart disease Sister     Social History  reports that he quit smoking about 35 years ago. His smoking use included cigarettes. He started smoking about 50 years ago. He has a 15 pack-year smoking history. He has never used smokeless tobacco. He reports that he does not currently use alcohol. He reports that he does not use drugs.  No Known Allergies  Medications   Current Facility-Administered Medications:    [START ON 06/03/2024]  stroke: early stages of recovery book, , Does not apply, Once, Michaela Justin SQUIBB, MD   acetaminophen  (TYLENOL ) tablet 650 mg, 650  mg, Oral, Q4H PRN **OR** acetaminophen  (TYLENOL ) 160 MG/5ML solution 650 mg, 650 mg, Per Tube, Q4H PRN **OR** acetaminophen  (TYLENOL ) suppository 650 mg, 650 mg, Rectal, Q4H PRN, Michaela Justin SQUIBB, MD   nicardipine (CARDENE) 20mg  in 0.86% saline IV infusion (0.1 mg/ml), 0-15 mg/hr, Intravenous, Continuous PRN, Michaela Justin SQUIBB, MD   pantoprazole (PROTONIX) injection 40 mg, 40 mg, Intravenous, QHS, Justin Rasmussen, Justin SQUIBB, MD   senna-docusate (Senokot-S) tablet 1 tablet, 1 tablet, Oral, BID, Michaela Justin SQUIBB, MD   sodium chloride  flush (NS) 0.9 % injection 3 mL, 3 mL, Intravenous, Once, Trifan, Donnice PARAS, MD  Current Outpatient Medications:    acetaminophen  (TYLENOL ) 500 MG tablet, Take 1,000 mg by mouth., Disp: , Rfl:    albuterol  (VENTOLIN  HFA) 108 (90 Base) MCG/ACT inhaler, Inhale 2 puffs into the lungs every 6 (six) hours as needed for wheezing or shortness  of breath., Disp: 8 g, Rfl: 1   aspirin  81 MG chewable tablet, Chew 1 tablet (81 mg total) by mouth daily., Disp: 30 tablet, Rfl: 0   carvedilol  (COREG ) 6.25 MG tablet, Take 1 tablet (6.25 mg total) by mouth 2 (two) times daily with a meal. Patient must schedule annual appointment for further refills, Disp: 180 tablet, Rfl: 0   cetirizine (ZYRTEC) 10 MG tablet, Take 10 mg by mouth daily., Disp: , Rfl:    FARXIGA  10 MG TABS tablet, Take 1 tablet (10 mg total) by mouth daily before breakfast. Patient must schedule annual appointment for further refills, Disp: 90 tablet, Rfl: 0   Multiple Vitamin (MULTIVITAMIN WITH MINERALS) TABS tablet, Take 1 tablet by mouth daily., Disp: , Rfl:    rosuvastatin  (CRESTOR ) 10 MG tablet, Take 1 tablet (10 mg total) by mouth daily. Patient must schedule annual appointment for further refills, Disp: 90 tablet, Rfl: 0   sacubitril -valsartan  (ENTRESTO ) 97-103 MG, Take 1 tablet by mouth twice daily, Disp: 180 tablet, Rfl: 2   torsemide  (DEMADEX ) 20 MG tablet, Take 1 tablet (20 mg total) by mouth as needed (Swelling). (Patient taking differently: Take 20 mg by mouth daily.), Disp: 30 tablet, Rfl: 3   amoxicillin -clavulanate (AUGMENTIN ) 875-125 MG tablet, Take one tab PO Q12hr with food (Patient not taking: Reported on 06/02/2024), Disp: 10 tablet, Rfl: 0   benzonatate  (TESSALON ) 100 MG capsule, Take 1 capsule (100 mg total) by mouth 3 (three) times daily as needed. (Patient not taking: Reported on 06/02/2024), Disp: 30 capsule, Rfl: 0   fluticasone  (FLONASE ) 50 MCG/ACT nasal spray, Place 2 sprays into both nostrils daily. (Patient not taking: Reported on 07/12/2023), Disp: 16 g, Rfl: 11   montelukast  (SINGULAIR ) 10 MG tablet, Take one tab PO QHS (Patient not taking: Reported on 06/02/2024), Disp: 30 tablet, Rfl: 1  Vitals   Vitals:   06/02/24 1758 06/02/24 1905  BP: 123/67   Pulse: 78   Resp: 18   Temp: 99 F (37.2 C)   SpO2: 93% 95%    There is no height or weight on  file to calculate BMI.   Physical Exam   Constitutional: Appears well-developed and well-nourished.  Psych: Affect appropriate to situation.  Eyes: No scleral injection.  HENT: No OP obstruction.  Head: Normocephalic.  Cardiovascular: Normal rate and regular rhythm.  Respiratory: Effort normal, non-labored breathing.  GI: Soft.  No distension. There is no tenderness.  Skin: WDI.  2+ pitting edema  Neurologic Examination    Neuro: Mental Status: Patient is awake, alert, he has a significant aphasia but is able to follow commands  and appears to be understanding relatively well.  He has significant difficulty with speaking, with only fragmentary expression Cranial Nerves: II: Visual Fields are full. Pupils are equal, round, and reactive to light.   III,IV, VI: EOMI without ptosis or diploplia.  V: Facial sensation is symmetric to temperature VII: Facial movement with mild right facial weakness VIII: hearing is intact to voice X: Uvula elevates symmetrically XII: tongue is midline without atrophy or fasciculations.  Motor: He has mild weakness of the right arm and leg, intact on the left Sensory: Sensation is symmetric to light touch and temperature in the arms and legs. Cerebellar: No clear ataxia      Labs/Imaging/Neurodiagnostic studies   CBC:  Recent Labs  Lab 06-05-2024 1815 06-05-2024 1818  WBC 8.7  --   NEUTROABS 7.0  --   HGB 12.3* 12.9*  HCT 39.9 38.0*  MCV 83.5  --   PLT 342  --    Basic Metabolic Panel:  Lab Results  Component Value Date   NA 141 2024-06-05   K 3.8 06-05-24   CO2 27 Jun 05, 2024   GLUCOSE 130 (H) 05-Jun-2024   BUN 17 06-05-24   CREATININE 1.30 (H) 06-05-2024   CALCIUM  8.9 2024/06/05   GFRNONAA >60 2024/06/05   GFRAA 78 05/16/2020   Lipid Panel:  Lab Results  Component Value Date   LDLCALC 134 (H) 11/10/2021   HgbA1c:  Lab Results  Component Value Date   HGBA1C 5.9 (H) 11/10/2021   Urine Drug Screen: No results found  for: LABOPIA, COCAINSCRNUR, LABBENZ, AMPHETMU, THCU, LABBARB  Alcohol Level     Component Value Date/Time   [facility] <15 06/05/24 1815   INR  Lab Results  Component Value Date   INR 1.2 2024-06-05   APTT  Lab Results  Component Value Date   APTT 38 (H) 06/05/24    CT Head without contrast(Personally reviewed): Large IPH left frontal region  CT angio Head and Neck with contrast(Personally reviewed): Negative   ASSESSMENT   Roemello Prest is a 68 y.o. male with a history of melanoma with large IPH.  Based on the appearance, my suspicion is that this is likely hemorrhagic met, but will need MRI.  With the size, I do think that he needs to be observed closely in the ICU overnight and therefore I will admit him to the ICU.  I discussed CODE STATUS with the patient and his wife, and they agree that if things were to worsen to the point that he needed to be intubated, they would not want intubation, and therefore would not want chest compressions either.  Secondary diagnosis: Chronic systolic CHF Hyponatremia Pulmonary hypertension Melanoma, now with suspected metastatic disease COPD Nonischemic cardiomyopathy  RECOMMENDATIONS  ICH: 1) Admit to ICU 2) no antiplatelets or anticoagulants 3) blood pressure control with goal systolic < 150 4) Frequent neuro checks 5) If symptoms worsen or there is decreased mental status, repeat stat head CT 6) PT,OT,ST 7) MRI brain with and without contrast 8) CODE STATUS DNR  Chronic systolic heart failure: Continue Coreg , he is borderline hypotensive so we will hold others  COPD: Continue home montelukast   CODE STATUS: DNR  ______________________________________________________________________   This patient is critically ill and at significant risk of neurological worsening, death and care requires constant monitoring of vital signs, hemodynamics,respiratory and cardiac monitoring, neurological assessment, discussion  with family, other specialists and medical decision making of high complexity. I spent 55 minutes of neurocritical care time  in the care of  this patient. This was time spent independent of any time provided by nurse practitioner or PA.  Justin Seals, MD [facility]   If 7pm- 7am, please page neurology on call as listed in AMION. 06/02/2024  8:47 PM

## 2024-06-02 NOTE — ED Notes (Signed)
 Report given to [facility] on ICU floor.

## 2024-06-02 NOTE — ED Provider Notes (Signed)
 Justin Rasmussen Provider Note   CSN: [id number] Arrival date & time: 06/02/24  1755     Patient presents with: Stroke Symptoms   Justin Rasmussen is a 68 y.o. male with history of obesity, lymphedema, heart failure, high cholesterol, presented to ED with difficulty with speech.  His wife at bedside reports the patient was normal this morning at 9 AM.  Around noon or lunchtime, she noted the patient seemed tired and was not speaking much.  It was answering a very short and monosyllabic words.  He seemed a little confused.  Later it became obvious to the patient having a hard time speaking and was confused.  She brought him into the ED.  The patient denies a headache.  He is intermittently confused.  He denies numbness of his arms or legs.  He does have a right facial droop his wife noted   HPI     Prior to Admission medications   Medication Sig Start Date End Date Taking? Authorizing Provider  acetaminophen  (TYLENOL ) 500 MG tablet Take 1,000 mg by mouth.   Yes [provider]  albuterol  (VENTOLIN  HFA) 108 (90 Base) MCG/ACT inhaler Inhale 2 puffs into the lungs every 6 (six) hours as needed for wheezing or shortness of breath. 02/10/24  Yes Pauline Garnette LABOR, MD  aspirin  81 MG chewable tablet Chew 1 tablet (81 mg total) by mouth daily. 06/08/19  Yes Rosario Leatrice FERNS, MD  carvedilol  (COREG ) 6.25 MG tablet Take 1 tablet (6.25 mg total) by mouth 2 (two) times daily with a meal. Patient must schedule annual appointment for further refills 03/28/24  Yes Croitoru, Mihai, MD  cetirizine (ZYRTEC) 10 MG tablet Take 10 mg by mouth daily.   Yes [provider]  FARXIGA  10 MG TABS tablet Take 1 tablet (10 mg total) by mouth daily before breakfast. Patient must schedule annual appointment for further refills 03/28/24  Yes Croitoru, Mihai, MD  Multiple Vitamin (MULTIVITAMIN WITH MINERALS) TABS tablet Take 1 tablet by mouth daily.   Yes [provider]  rosuvastatin  (CRESTOR ) 10 MG tablet Take 1 tablet (10 mg total) by mouth daily. Patient must schedule annual appointment for further refills 03/28/24  Yes Croitoru, Mihai, MD  sacubitril -valsartan  (ENTRESTO ) 97-103 MG Take 1 tablet by mouth twice daily 09/21/23  Yes Croitoru, Mihai, MD  torsemide  (DEMADEX ) 20 MG tablet Take 1 tablet (20 mg total) by mouth as needed (Swelling). Patient taking differently: Take 20 mg by mouth daily. 11/10/23  Yes Croitoru, Mihai, MD  amoxicillin -clavulanate (AUGMENTIN ) 875-125 MG tablet Take one tab PO Q12hr with food Patient not taking: Reported on 06/02/2024 02/10/24   Pauline Garnette LABOR, MD  benzonatate  (TESSALON ) 100 MG capsule Take 1 capsule (100 mg total) by mouth 3 (three) times daily as needed. Patient not taking: Reported on 06/02/2024 01/20/24   Kennyth Domino, FNP  fluticasone  (FLONASE ) 50 MCG/ACT nasal spray Place 2 sprays into both nostrils daily. Patient not taking: Reported on 07/12/2023 11/19/19   Gretta Doffing P, DO  montelukast  (SINGULAIR ) 10 MG tablet Take one tab PO QHS Patient not taking: Reported on 06/02/2024 02/10/24   Pauline Garnette LABOR, MD    Allergies: Patient has no known allergies.    Review of Systems  Updated Vital Signs BP (!) 132/57   Pulse 70   Temp 98.7 F (37.1 C) (Oral)   Resp (!) 29   Ht 5' 7 (1.702 m)   Wt (!) 138.1 kg   SpO2 91%  BMI 47.68 kg/m   Physical Exam Constitutional:      General: He is not in acute distress. HENT:     Head: Normocephalic and atraumatic.  Eyes:     Conjunctiva/sclera: Conjunctivae normal.     Pupils: Pupils are equal, round, and reactive to light.  Cardiovascular:     Rate and Rhythm: Normal rate and regular rhythm.  Pulmonary:     Effort: Pulmonary effort is normal. No respiratory distress.  Abdominal:     General: There is no distension.     Tenderness: There is no abdominal tenderness.  Skin:    General: Skin is warm and dry.  Neurological:     Mental Status: He is  alert.     Comments: Expressive aphasia, right-sided facial droop, sensation is grossly symmetrical and intact, questionable right arm pronator drift on exam, no obvious gaze deviation on exam, difficult to assess peripheral fields  Psychiatric:        Mood and Affect: Mood normal.        Behavior: Behavior normal.     (all labs ordered are listed, but only abnormal results are displayed) Labs Reviewed  PROTIME-INR - Abnormal; Notable for the following components:      Result Value   Prothrombin Time 15.7 (*)    All other components within normal limits  APTT - Abnormal; Notable for the following components:   aPTT 38 (*)    All other components within normal limits  CBC - Abnormal; Notable for the following components:   Hemoglobin 12.3 (*)    MCH 25.7 (*)    RDW 15.7 (*)    All other components within normal limits  COMPREHENSIVE METABOLIC PANEL WITH GFR - Abnormal; Notable for the following components:   Glucose, Bld 127 (*)    Creatinine, Ser 1.26 (*)    All other components within normal limits  I-STAT CHEM 8, ED - Abnormal; Notable for the following components:   Creatinine, Ser 1.30 (*)    Glucose, Bld 130 (*)    Calcium , Ion 1.09 (*)    Hemoglobin 12.9 (*)    HCT 38.0 (*)    All other components within normal limits  CBG MONITORING, ED - Abnormal; Notable for the following components:   Glucose-Capillary 124 (*)    All other components within normal limits  RESP PANEL BY RT-PCR (RSV, FLU A&B, COVID)  RVPGX2  MRSA NEXT GEN BY PCR, NASAL  DIFFERENTIAL  ETHANOL  HIV ANTIBODY (ROUTINE TESTING W REFLEX)  HEMOGLOBIN A1C  LIPID PANEL  BASIC METABOLIC PANEL WITH GFR  CBC    EKG: None  Radiology: DG CHEST PORT 1 VIEW Result Date: 06/02/2024 CLINICAL DATA:  CHF.  Altered mental status.  Slurred speech. EXAM: PORTABLE CHEST 1 VIEW COMPARISON:  11/16/2023 FINDINGS: Mild cardiomegaly with vascular congestion. Interstitial prominence could reflect interstitial edema.  Stable mild elevation of the right hemidiaphragm with right base atelectasis. No effusions. IMPRESSION: Cardiomegaly with vascular congestion. Suspect mild interstitial edema. Right base atelectasis. Electronically Signed   By: Franky Crease M.D.   On: 06/02/2024 22:22   CT ANGIO HEAD NECK W WO CM (CODE STROKE) Result Date: 06/02/2024 EXAM: CTA HEAD AND NECK WITHOUT AND WITH 06/02/2024 06:50:16 PM TECHNIQUE: CTA of the head and neck was performed without and with the administration of 75 mL of iohexol  (OMNIPAQUE ) 350 MG/ML injection. Multiplanar 2D and/or 3D reformatted images are provided for review. Automated exposure control, iterative reconstruction, and/or weight based adjustment of the mA/kV was  utilized to reduce the radiation dose to as low as reasonably achievable. Stenosis of the internal carotid arteries measured using NASCET criteria. COMPARISON: None available CLINICAL HISTORY: Neuro deficit, acute, stroke suspected. 75mL Omni 350 IV. Code stroke. Aphasia; slurred speech. LKW 0900; Michaela [phone]. FINDINGS: CTA NECK: AORTIC ARCH AND ARCH VESSELS: Atherosclerotic calcifications are present in the distal aortic arch. Grade vessel origins are within normal limits. No dissection or arterial injury. No significant stenosis of the brachiocephalic or subclavian arteries. CERVICAL CAROTID ARTERIES: Mild calcifications are present in the proximal ICA bilaterally. No significant stenoses are present. Moderate tortuosity is present in the left cervical ICA. No dissection or arterial injury. CERVICAL VERTEBRAL ARTERIES: No dissection, arterial injury, or significant stenosis. LUNGS AND MEDIASTINUM: Unremarkable. SOFT TISSUES: No acute abnormality. BONES: Multilevel degenerative changes are present in the cervical spine. CTA HEAD: ANTERIOR CIRCULATION: Atherosclerotic calcifications are present in the cavernous internal carotid arteries bilaterally. No significant stenosis of the internal carotid arteries.  No significant stenosis of the anterior cerebral arteries. No significant stenosis of the middle cerebral arteries. No aneurysm. POSTERIOR CIRCULATION: No significant stenosis of the posterior cerebral arteries. No significant stenosis of the basilar artery. No significant stenosis of the vertebral arteries. No aneurysm. OTHER: No dural venous sinus thrombosis on this non-dedicated study. No vascular malformation or spot sign is associated with the hemorrhage. There does appear to be some vascularity within the area suspected to be a mass. IMPRESSION: 1. No large vessel occlusion, hemodynamically significant stenosis, or aneurysm in the head or neck. 2. Atherosclerotic calcifications in the distal aortic arch, cavernous internal carotid arteries bilaterally, and proximal ICA bilaterally without significant stenosis. Moderate tortuosity of the left cervical ICA. Electronically signed by: Lonni Necessary MD 06/02/2024 07:18 PM EDT RP Workstation: [id number]   CT HEAD CODE STROKE WO CONTRAST` Result Date: 06/02/2024 EXAM: CT HEAD WITHOUT CONTRAST 06/02/2024 06:36:34 PM TECHNIQUE: CT of the head was performed without the administration of intravenous contrast. Automated exposure control, iterative reconstruction, and/or weight based adjustment of the mA/kV was utilized to reduce the radiation dose to as low as reasonably achievable. COMPARISON: None available. CLINICAL HISTORY: stroke suspected. Non con. Code stroke. Aphasia; slurred speech. LKW 0900; Michaela [phone]. FINDINGS: BRAIN AND VENTRICLES: Hemorrhage in the anterior left frontal lobe measures 4.3 x 6.2 x 4.2 cm. Estimated volume of the hemorrhage is 56 ml. Layering blood products are present. Within the anterior superior aspect of the hemorrhage is a more heterogeneous region suggesting an underlying mass which measures 3.4 x 1.9 cm. Marked surrounding vasogenic edema is present. Mass effect is present with 9 mm of midline shift. No other focal  hemorrhage or mass lesion is present. No evidence of acute infarct. No hydrocephalus. No extra-axial collection. ORBITS: No acute abnormality. SINUSES: Chronic opacification of the right maxillary sinus present. A small fluid level is present in the left maxillary sinus. Mucosal thickening is present in the anterior ethmoid air cells and inferior frontal sinuses bilaterally. SOFT TISSUES AND SKULL: No acute soft tissue abnormality. No skull fracture. IMPRESSION: 1. Hemorrhage in the anterior left frontal lobe measuring 4.3 x 6.2 x 4.2 cm (estimated volume 56.0 mL) with layering blood products and a heterogeneous region in the anterior superior aspect suggesting an underlying mass measuring 3.4 x 1.9 cm. MRI of the head without and with contrast may be useful for further evaluation. 2. Marked surrounding vasogenic edema and mass effect with 9 mm of midline shift. Critical values were communicated to Dr. Michaela at 6:48 via the  amion system Electronically signed by: Lonni Necessary MD 06/02/2024 06:48 PM EDT RP Workstation: [id number]     Procedures   Medications Ordered in the ED   stroke: early stages of recovery book (has no administration in time range)  acetaminophen  (TYLENOL ) tablet 650 mg (has no administration in time range)    Or  acetaminophen  (TYLENOL ) 160 MG/5ML solution 650 mg (has no administration in time range)    Or  acetaminophen  (TYLENOL ) suppository 650 mg (has no administration in time range)  senna-docusate (Senokot-S) tablet 1 tablet (1 tablet Oral Not Given 06/02/24 2209)  pantoprazole (PROTONIX) injection 40 mg (40 mg Intravenous Given 06/02/24 2202)  nicardipine (CARDENE) 20mg  in 0.86% saline 200ml IV infusion (0.1 mg/ml) (has no administration in time range)  Chlorhexidine Gluconate Cloth 2 % PADS 6 each (6 each Topical Given 06/02/24 2015)  carvedilol  (COREG ) tablet 6.25 mg (has no administration in time range)  docusate sodium (COLACE) capsule 100 mg (has no  administration in time range)  polyethylene glycol (MIRALAX / GLYCOLAX) packet 17 g (has no administration in time range)  insulin aspart (novoLOG) injection 0-15 Units ( Subcutaneous Not Given 06/02/24 2208)  ipratropium-albuterol  (DUONEB) 0.5-2.5 (3) MG/3ML nebulizer solution 3 mL (has no administration in time range)  levETIRAcetam (KEPPRA) undiluted injection 500 mg (has no administration in time range)  0.9 %  sodium chloride  infusion (has no administration in time range)  sodium chloride  flush (NS) 0.9 % injection 3 mL (3 mLs Intravenous Given 06/02/24 2209)  iohexol  (OMNIPAQUE ) 350 MG/ML injection 75 mL (75 mLs Intravenous Contrast Given 06/02/24 1850)  metoCLOPramide (REGLAN) injection 5 mg (5 mg Intravenous Given 06/02/24 2202)    Clinical Course as of 06/02/24 2252  Sat Jun 02, 2024  1828 Activated code stroke [MT]  1839 Concern for hemorrhage on CT - possible brain met.  Will need ICU admisison with neurology. [MT]  1853 I updated his family wife and daughter about imaging [MT]    Clinical Course User Index [MT] Willie Plain, Donnice PARAS, MD                                 Medical Decision Making Amount and/or Complexity of Data Reviewed Labs: ordered. Radiology: ordered.  Risk Decision regarding hospitalization.   Patient presenting with aphasia.  Within 12-hour onset.  Difficult to assess for visual deficits on exam, but he is activated as a code stroke for possible LVO.  His wife by the supplemental history at the bedside.  His risk factors to include high cholesterol, obesity, and advanced age, for stroke risk factors  Neurology was consulted.  Patient's EKG per my interpretation shows sinus rhythm  I personally reviewed the patient's labs and imaging, notable for intraparenchymal brain hemorrhage.  Concerning for metastasis.  Patient will be admitted by neurology to the neuro ICU.  Further care per neurology service.  I did discuss the diagnosis with the patient's  family, wife and daughter at the bedside.  As well as with the patient.     Final diagnoses:  None    ED Discharge Orders     None          Cottie Donnice PARAS, MD 06/02/24 2253

## 2024-06-02 NOTE — Progress Notes (Addendum)
 eLink Physician-Brief Progress Note Patient Name: Justin Rasmussen DOB: 06/24/1956 MRN: [id number]   Date of Service  06/02/2024  HPI/Events of Note  eICU new admit brief review: 67 y.o. male with history of melanoma, obesity, systolic CHF, cardiomyopathy, Cor pulmonale, OSA, lymphedema,  high cholesterol, presented to ED with difficulty with speech , right facial droop without headache, seen by Neurology. Now in ICU for Left intraparenchymal brain hemorrhage, suspected mets related?.   Camera: Obese on room air. MAP 66, SBP 103, on cardene drip( on hold), sats 94%. HR 73.  Data: Reviewed. Glucose, LFT , cbc normal. Cr 1.3 ( 2021: at 1.29) EKG: normal sinus, RBBB. Qtc 470.   IMPRESSION: 1. Hemorrhage in the anterior left frontal lobe measuring 4.3 x 6.2 x 4.2 cm (estimated volume 56.0 mL) with layering blood products and a heterogeneous region in the anterior superior aspect suggesting an underlying mass measuring 3.4 x 1.9 cm. MRI of the head without and with contrast may be useful for further evaluation. 2. Marked surrounding vasogenic edema and mass effect with 9 mm of midline shift.  eICU Interventions  Follow Neurology advice Aspiration, seizure  precautions CBG goals < 180 MRI brain  Neuro checks.  Keep SBP < 150 per neuro. Cardene drip.  Code: DNR. If new symptoms or worsening neuro check to get CT head. SUP: protonix VTE: SCD Colace. Follow RVP. ECHO. Continue coreg .   Get CxR, if not done from admit.     Intervention Category Major Interventions: Other: (hemorrhagic CVA) Evaluation Type: New Patient Evaluation  Jodelle ONEIDA Hutching 06/02/2024, 8:17 PM  21:48  RN discussion: Vomited once, neurowise stable. No head ache. - Reglan ( qtc 460) 5 mg stat Aspiration and sz precautions. Ordered keppra 500 q12 hr pro phylactically for seizure prevention from ICB.  01:26 Patient is scheduled for MRI and nurse is trying to get patient ready to go down but even with  a dose of Reglan that you previously ordered patient is still dry heaving and RN is concerned he may vomit while in MRI and asking for something else for nausea? QTC = 480 now - zofran  ordered once.

## 2024-06-02 NOTE — ED Notes (Addendum)
 This NT tried to check the CBG but the glucometer in triage as well as the purple zone one, wouldn't work. Nurse Notified

## 2024-06-02 NOTE — Consult Note (Signed)
 NAMETimur Rasmussen, MRN:  [id number], DOB:  [birth year], LOS: 0 ADMISSION DATE:  06/02/2024, CONSULTATION DATE:  9/27 REFERRING MD:  Michaela, neuro CHIEF COMPLAINT: ICH   History of Present Illness:  68 year old male with past medical history of pulmonary hypertension, hyperlipidemia, OSA on CPAP at night, HFrEF, COPD, melanoma currently on nivolumab  presented to the ED this evening with right-sided weakness and aphasia. LKW around 9 AM.  Wife in the emergency department reported that around noon he seemed tired and not speaking much.  Also noted to be a little confused.  On arrival to ED, noted to have right-sided weakness, aphasia and code stroke was activated.  CT head with left frontal lobe 4 x 6 x 4 cm hemorrhage with concern for underlying mass, vasogenic edema and 9 mm midline shift.  CT angio with no LVO.  Labs with hemoglobin 12.3, sodium 140, creatinine 1.26, ethanol negative, glucose normal.  He was evaluated by neurology who admitted him to ICU.  CCM with cross coverage consult.  Pertinent  Medical History  pulmonary hypertension, hyperlipidemia, OSA on CPAP at night, HFrEF, COPD, melanoma currently on nivolumab   Significant Hospital Events: Including procedures, antibiotic start and stop dates in addition to other pertinent events   9/27: Admit to ICU s/p code stroke with left frontal lobe ICH and concern for mass  Interim History / Subjective:  Evaluated at bedside.  He has expressive aphasia.  He answers orientation questions appropriately with head nod, however has some difficulty with following commands.  No acute distress.  Daughters at bedside.  Objective   Blood pressure 128/81, pulse 73, temperature 98.7 F (37.1 C), temperature source Oral, resp. rate (!) 23, height 5' 7 (1.702 m), weight (!) 138.1 kg, SpO2 94%.       No intake or output data in the 24 hours ending 06/02/24 2057 Filed Weights   06/02/24 2015  Weight: (!) 138.1 kg    Examination: General:  Middle-age male, laying in bed, no acute distress HENT: NCAT, right facial droop, mucous membranes dry Lungs: Clear bilaterally, room air, respirations even and unlabored Cardiovascular: S1-S2, no murmur rub or gallop Abdomen: Protuberant, soft, +BS Extremities: Trace edema bilaterally Neuro: Awake and alert, tracks.  PERRLA.  Right facial droop.  Right sided weakness.  Spontaneous antigravity in all 4 extremities however right leg bobs when attempting antigravity.  Does not verbally respond to me at all. GU: No Foley  Resolved Hospital Problem list    Assessment & Plan:  Acute left frontal lobe ICH Vasogenic edema and 9 mm midline shift Concern for underlying hemorrhagic metastasis Last known well 9 AM 06/02/2024.  Right-sided weakness, right facial droop, Broca's aphasia.  History of melanoma on treatment currently and imaging concerning for hemorrhagic mass. - Stroke primary, appreciate management - No antiplatelets or anticoagulants - SBP goal < 150, prn cardene gtt ordered  - frequent neuro checks  - stat imaging for change in mental status - f/u MRI brain  - PT/OT/SLP  HFrEF Hypertension Hyperlipidemia  On torsemide  20 daily, entresto  bid, crestor  10mg  daily  - goal SBP < 150  - coreg  6.25mg  BID  - prn cardene gtt for goals  - f/u echocardiogram  - check lipid panel, restart statin when clinically appropriate   COPD  OSA on CPAP at night  Pulmonary hypertension, previously on home O2, now prn  On albuterol  prn - CPAP ordered nightly  - O2 prn for sat >90%  - prn duoneb  - do not see  outpatient pulmonology visits - set up f/u appt   Diabetes? On Farxiga  at home  - check A1c  - ssi  - cbg q4h   GOC: discussed by primary with wife and made DNR-Limited   Labs   CBC: Recent Labs  Lab 06/02/24 1815 06/02/24 1818  WBC 8.7  --   NEUTROABS 7.0  --   HGB 12.3* 12.9*  HCT 39.9 38.0*  MCV 83.5  --   PLT 342  --     Basic Metabolic Panel: Recent Labs  Lab  06/02/24 1815 06/02/24 1818  NA 140 141  K 3.8 3.8  CL 99 100  CO2 27  --   GLUCOSE 127* 130*  BUN 16 17  CREATININE 1.26* 1.30*  CALCIUM  8.9  --    GFR: Estimated Creatinine Clearance: 75 mL/min (A) (by C-G formula based on SCr of 1.3 mg/dL (H)). Recent Labs  Lab 06/02/24 1815  WBC 8.7    Liver Function Tests: Recent Labs  Lab 06/02/24 1815  AST 21  ALT 13  ALKPHOS 46  BILITOT 0.7  PROT 7.1  ALBUMIN 3.5   No results for input(s): LIPASE, AMYLASE in the last 168 hours. No results for input(s): AMMONIA in the last 168 hours.  ABG    Component Value Date/Time   PHART 7.322 (L) 06/05/2019 0753   PCO2ART 73.6 (HH) 06/05/2019 0753   PO2ART 98.0 06/05/2019 0753   HCO3 38.7 (H) 06/05/2019 0800   TCO2 29 06/02/2024 1818   O2SAT 65.0 06/05/2019 0800     Coagulation Profile: Recent Labs  Lab 06/02/24 1815  INR 1.2    Cardiac Enzymes: No results for input(s): CKTOTAL, CKMB, CKMBINDEX, TROPONINI in the last 168 hours.  HbA1C: Hgb A1c MFr Bld  Date/Time Value Ref Range Status  11/10/2021 10:22 AM 5.9 (H) 4.8 - 5.6 % Final    Comment:             Prediabetes: 5.7 - 6.4          Diabetes: >6.4          Glycemic control for adults with diabetes: <7.0   06/02/2019 03:44 AM 6.0 (H) 4.8 - 5.6 % Final    Comment:    (NOTE) Pre diabetes:          5.7%-6.4% Diabetes:              >6.4% Glycemic control for   <7.0% adults with diabetes     CBG: Recent Labs  Lab 06/02/24 1853  GLUCAP 124*    Review of Systems:   As above  Past Medical History:  He,  has a past medical history of 2019 novel coronavirus disease (COVID-19) (07/2019), COPD (chronic obstructive pulmonary disease) (HCC), HFrEF (heart failure with reduced ejection fraction) (HCC), Melanoma (HCC), Nonischemic cardiomyopathy (HCC), OSA (obstructive sleep apnea), and Pulmonary hypertension (HCC).   Surgical History:   Past Surgical History:  Procedure Laterality Date    ADENOIDECTOMY     RIGHT/LEFT HEART CATH AND CORONARY ANGIOGRAPHY N/A 06/05/2019   Procedure: RIGHT/LEFT HEART CATH AND CORONARY ANGIOGRAPHY;  Surgeon: Dann Candyce RAMAN, MD;  Location: [facility] INVASIVE CV LAB;  Service: Cardiovascular;  Laterality: N/A;     Social History:   reports that he quit smoking about 35 years ago. His smoking use included cigarettes. He started smoking about 50 years ago. He has a 15 pack-year smoking history. He has never used smokeless tobacco. He reports that he does not currently use alcohol. He reports  that he does not use drugs.   Family History:  His family history includes Diabetes in his father and sister; Heart attack in his father; Heart disease in his sister; Kidney disease in his mother.   Allergies No Known Allergies   Home Medications  Prior to Admission medications   Medication Sig Start Date End Date Taking? Authorizing Provider  acetaminophen  (TYLENOL ) 500 MG tablet Take 1,000 mg by mouth.   Yes [provider]  albuterol  (VENTOLIN  HFA) 108 (90 Base) MCG/ACT inhaler Inhale 2 puffs into the lungs every 6 (six) hours as needed for wheezing or shortness of breath. 02/10/24  Yes Pauline Garnette LABOR, MD  aspirin  81 MG chewable tablet Chew 1 tablet (81 mg total) by mouth daily. 06/08/19  Yes Rosario Leatrice FERNS, MD  carvedilol  (COREG ) 6.25 MG tablet Take 1 tablet (6.25 mg total) by mouth 2 (two) times daily with a meal. Patient must schedule annual appointment for further refills 03/28/24  Yes Croitoru, Mihai, MD  cetirizine (ZYRTEC) 10 MG tablet Take 10 mg by mouth daily.   Yes [provider]  FARXIGA  10 MG TABS tablet Take 1 tablet (10 mg total) by mouth daily before breakfast. Patient must schedule annual appointment for further refills 03/28/24  Yes Croitoru, Mihai, MD  Multiple Vitamin (MULTIVITAMIN WITH MINERALS) TABS tablet Take 1 tablet by mouth daily.   Yes [provider]  rosuvastatin  (CRESTOR ) 10 MG tablet Take 1 tablet (10 mg  total) by mouth daily. Patient must schedule annual appointment for further refills 03/28/24  Yes Croitoru, Mihai, MD  sacubitril -valsartan  (ENTRESTO ) 97-103 MG Take 1 tablet by mouth twice daily 09/21/23  Yes Croitoru, Mihai, MD  torsemide  (DEMADEX ) 20 MG tablet Take 1 tablet (20 mg total) by mouth as needed (Swelling). Patient taking differently: Take 20 mg by mouth daily. 11/10/23  Yes Croitoru, Mihai, MD  amoxicillin -clavulanate (AUGMENTIN ) 875-125 MG tablet Take one tab PO Q12hr with food Patient not taking: Reported on 06/02/2024 02/10/24   Pauline Garnette LABOR, MD  benzonatate  (TESSALON ) 100 MG capsule Take 1 capsule (100 mg total) by mouth 3 (three) times daily as needed. Patient not taking: Reported on 06/02/2024 01/20/24   Kennyth Domino, FNP  fluticasone  (FLONASE ) 50 MCG/ACT nasal spray Place 2 sprays into both nostrils daily. Patient not taking: Reported on 07/12/2023 11/19/19   Gretta Doffing P, DO  montelukast  (SINGULAIR ) 10 MG tablet Take one tab PO QHS Patient not taking: Reported on 06/02/2024 02/10/24   Pauline Garnette LABOR, MD     Critical care time: 79   Tinnie FORBES Adolph DEVONNA [facility] 06/02/24 9:42 PM  Please see [email or web address] for pager details.  From 7A-7P if no response, please call [phone] After hours, please call ELink [phone]

## 2024-06-03 ENCOUNTER — Inpatient Hospital Stay (HOSPITAL_COMMUNITY)

## 2024-06-03 ENCOUNTER — Other Ambulatory Visit (HOSPITAL_COMMUNITY)

## 2024-06-03 DIAGNOSIS — I611 Nontraumatic intracerebral hemorrhage in hemisphere, cortical: Secondary | ICD-10-CM | POA: Diagnosis not present

## 2024-06-03 DIAGNOSIS — I5020 Unspecified systolic (congestive) heart failure: Secondary | ICD-10-CM | POA: Diagnosis not present

## 2024-06-03 DIAGNOSIS — G936 Cerebral edema: Secondary | ICD-10-CM | POA: Diagnosis not present

## 2024-06-03 DIAGNOSIS — I609 Nontraumatic subarachnoid hemorrhage, unspecified: Secondary | ICD-10-CM

## 2024-06-03 DIAGNOSIS — G4733 Obstructive sleep apnea (adult) (pediatric): Secondary | ICD-10-CM

## 2024-06-03 DIAGNOSIS — J449 Chronic obstructive pulmonary disease, unspecified: Secondary | ICD-10-CM

## 2024-06-03 DIAGNOSIS — I615 Nontraumatic intracerebral hemorrhage, intraventricular: Secondary | ICD-10-CM

## 2024-06-03 DIAGNOSIS — R220 Localized swelling, mass and lump, head: Secondary | ICD-10-CM | POA: Diagnosis not present

## 2024-06-03 DIAGNOSIS — E785 Hyperlipidemia, unspecified: Secondary | ICD-10-CM

## 2024-06-03 DIAGNOSIS — R29709 NIHSS score 9: Secondary | ICD-10-CM | POA: Diagnosis not present

## 2024-06-03 DIAGNOSIS — G939 Disorder of brain, unspecified: Secondary | ICD-10-CM | POA: Diagnosis not present

## 2024-06-03 DIAGNOSIS — I10 Essential (primary) hypertension: Secondary | ICD-10-CM | POA: Diagnosis not present

## 2024-06-03 LAB — CBC
HCT: 40 % (ref 39.0–52.0)
Hemoglobin: 12.7 g/dL — ABNORMAL LOW (ref 13.0–17.0)
MCH: 26.5 pg (ref 26.0–34.0)
MCHC: 31.8 g/dL (ref 30.0–36.0)
MCV: 83.5 fL (ref 80.0–100.0)
Platelets: 322 K/uL (ref 150–400)
RBC: 4.79 MIL/uL (ref 4.22–5.81)
RDW: 15.7 % — ABNORMAL HIGH (ref 11.5–15.5)
WBC: 10.4 K/uL (ref 4.0–10.5)
nRBC: 0 % (ref 0.0–0.2)

## 2024-06-03 LAB — GLUCOSE, CAPILLARY
Glucose-Capillary: 165 mg/dL — ABNORMAL HIGH (ref 70–99)
Glucose-Capillary: 172 mg/dL — ABNORMAL HIGH (ref 70–99)
Glucose-Capillary: 177 mg/dL — ABNORMAL HIGH (ref 70–99)
Glucose-Capillary: 154 mg/dL — ABNORMAL HIGH (ref 70–99)
Glucose-Capillary: 155 mg/dL — ABNORMAL HIGH (ref 70–99)
Glucose-Capillary: 151 mg/dL — ABNORMAL HIGH (ref 70–99)

## 2024-06-03 LAB — BLOOD GAS, VENOUS
Acid-Base Excess: 5.5 mmol/L — ABNORMAL HIGH (ref 0.0–2.0)
Bicarbonate: 32.4 mmol/L — ABNORMAL HIGH (ref 20.0–28.0)
O2 Saturation: 66.8 %
Patient temperature: 36.7
pCO2, Ven: 55 mmHg (ref 44–60)
pH, Ven: 7.37 (ref 7.25–7.43)
pO2, Ven: 39 mmHg (ref 32–45)

## 2024-06-03 LAB — HIV ANTIBODY (ROUTINE TESTING W REFLEX): HIV Screen 4th Generation wRfx: NONREACTIVE

## 2024-06-03 LAB — BASIC METABOLIC PANEL WITH GFR
Anion gap: 12 (ref 5–15)
BUN: 20 mg/dL (ref 8–23)
CO2: 26 mmol/L (ref 22–32)
Calcium: 8.7 mg/dL — ABNORMAL LOW (ref 8.9–10.3)
Chloride: 103 mmol/L (ref 98–111)
Creatinine, Ser: 1.43 mg/dL — ABNORMAL HIGH (ref 0.61–1.24)
GFR, Estimated: 54 mL/min — ABNORMAL LOW (ref >60)
Glucose, Bld: 168 mg/dL — ABNORMAL HIGH (ref 70–99)
Potassium: 3.8 mmol/L (ref 3.5–5.1)
Sodium: 141 mmol/L (ref 135–145)

## 2024-06-03 LAB — SODIUM
Sodium: 145 mmol/L (ref 135–145)
Sodium: 143 mmol/L (ref 135–145)
Sodium: 144 mmol/L (ref 135–145)

## 2024-06-03 MED ORDER — GADOBUTROL 1 MMOL/ML IV SOLN
10.0000 mL | Freq: Once | INTRAVENOUS | Status: AC | PRN
Start: 2024-06-03 — End: 2024-06-03
  Administered 2024-06-03: 10 mL via INTRAVENOUS

## 2024-06-03 MED ORDER — DEXAMETHASONE SODIUM PHOSPHATE 4 MG/ML IJ SOLN
4.0000 mg | Freq: Four times a day (QID) | INTRAMUSCULAR | Status: DC
Start: 2024-06-03 — End: 2024-06-19
  Administered 2024-06-03 – 2024-06-19 (×63): 4 mg via INTRAVENOUS
  Filled 2024-06-03 (×62): qty 1

## 2024-06-03 MED ORDER — INSULIN ASPART 100 UNIT/ML IJ SOLN
0.0000 [IU] | Freq: Three times a day (TID) | INTRAMUSCULAR | Status: DC
  Administered 2024-06-04: 3 [IU] via SUBCUTANEOUS
  Administered 2024-06-04 – 2024-06-06 (×7): 2 [IU] via SUBCUTANEOUS
  Administered 2024-06-07 (×2): 3 [IU] via SUBCUTANEOUS
  Administered 2024-06-07 – 2024-06-08 (×2): 2 [IU] via SUBCUTANEOUS

## 2024-06-03 MED ORDER — ONDANSETRON HCL 4 MG/2ML IJ SOLN
4.0000 mg | Freq: Four times a day (QID) | INTRAMUSCULAR | Status: DC | PRN
  Administered 2024-06-03: 4 mg via INTRAVENOUS
  Filled 2024-06-03: qty 2

## 2024-06-03 MED ORDER — SODIUM CHLORIDE 3 % IV SOLN
INTRAVENOUS | Status: DC
  Filled 2024-06-03 (×5): qty 500

## 2024-06-03 MED ORDER — ONDANSETRON HCL 4 MG/2ML IJ SOLN
4.0000 mg | Freq: Once | INTRAMUSCULAR | Status: AC | PRN
  Administered 2024-06-03: 4 mg via INTRAVENOUS
  Filled 2024-06-03: qty 2

## 2024-06-03 MED ORDER — LORAZEPAM 2 MG/ML IJ SOLN
0.5000 mg | Freq: Once | INTRAMUSCULAR | Status: AC
  Administered 2024-06-03: 0.5 mg via INTRAVENOUS
  Filled 2024-06-03: qty 1

## 2024-06-03 NOTE — Progress Notes (Incomplete Revision)
 STROKE TEAM PROGRESS NOTE    SIGNIFICANT HOSPITAL EVENTS 9/27: Justin Rasmussen presented with right-sided weakness and aphasia, found to have left frontal ICH. 9/28: MRI reveals hemorrhagic tumor in the anterior left frontal lobe, neurosurgery consulted  INTERIM HISTORY/SUBJECTIVE  Justin Rasmussen remains hemodynamically stable and afebrile overnight.  He continues to have expressive and receptive aphasia.  Neurosurgery has seen Justin Rasmussen and plans for operative intervention soon.  OBJECTIVE  CBC    Component Value Date/Time   WBC 10.4 06/03/2024 0551   RBC 4.79 06/03/2024 0551   HGB 12.7 (L) 06/03/2024 0551   HCT 40.0 06/03/2024 0551   PLT 322 06/03/2024 0551   MCV 83.5 06/03/2024 0551   MCH 26.5 06/03/2024 0551   MCHC 31.8 06/03/2024 0551   RDW 15.7 (H) 06/03/2024 0551   LYMPHSABS 0.9 06/02/2024 1815   MONOABS 0.6 06/02/2024 1815   EOSABS 0.1 06/02/2024 1815   BASOSABS 0.0 06/02/2024 1815    BMET    Component Value Date/Time   NA 145 06/03/2024 1111   NA 141 11/10/2021 1022   K 3.8 06/03/2024 0551   CL 103 06/03/2024 0551   CO2 26 06/03/2024 0551   GLUCOSE 168 (H) 06/03/2024 0551   BUN 20 06/03/2024 0551   BUN 18 11/10/2021 1022   CREATININE 1.43 (H) 06/03/2024 0551   CALCIUM  8.7 (L) 06/03/2024 0551   EGFR 76 11/10/2021 1022   GFRNONAA 54 (L) 06/03/2024 0551    IMAGING past 24 hours MR BRAIN W WO CONTRAST Result Date: 06/03/2024 CLINICAL DATA:  68 year old male code stroke presentation yesterday with large left frontal lobe intra-axial hemorrhage. EXAM: MRI HEAD WITHOUT AND WITH CONTRAST TECHNIQUE: Multiplanar, multiecho pulse sequences of the brain and surrounding structures were obtained without and with intravenous contrast. CONTRAST:  10mL GADAVIST  GADOBUTROL  1 MMOL/ML IV SOLN COMPARISON:  CT head, CTA head and neck yesterday. FINDINGS: Study is intermittently degraded by motion artifact despite repeated imaging attempts. Brain: Large in heterogeneous intra-axial hemorrhage with  layering hematocrit level in the anterior left frontal lobe, blood products encompassing roughly 65 by 55 by 46 mm on MRI now (AP by transverse by CC) for an estimated blood volume 82 mL. This compares to approximately 64 mL on the presentation CT when measured with the same technique. Surrounding T2 and FLAIR hyperintense edema. Following contrast there is rounded, masslike enhancement along the anterior superior margin of the hemorrhage in an area of 25 x 22 x 18 mm (series 19, image 20 and series 20, image 24. That lesion also appears different from the remaining hematoma on DWI. No 2nd enhancing brain lesion is identified. No dural thickening. Intraventricular blood volume is moderate and increased since the presentation CT. Ventricle size and configuration not significantly changed. No significant subarachnoid or other extra-axial blood component is identified. Basilar cistern patency appears stable from the CTA. Rightward midline shift at the anterior septum pellucidum is 12 mm and mildly increased since presentation. Blood products related susceptibility on DWI. No larger area of diffusion restriction. No restricted diffusion elsewhere. On SWI no chronic cerebral blood products identified. Scattered white matter T2 and FLAIR hyperintensity on the late it to the acute edema surrounding the left frontal lobe hematoma. Mild T2 heterogeneity also in the central pons. No cortical encephalomalacia identified. Pituitary and cervicomedullary junction appear negative. Cerebellum appears negative. Vascular: Major intracranial vascular flow voids are preserved. Skull and upper cervical spine: Negative visible cervical spine. Visualized bone marrow signal is within normal limits. Sinuses/Orbits: Negative orbits. Stable paranasal sinus disease as  seen by CT. Other: Mild mastoid effusion also stable. Negative visible scalp and face. IMPRESSION: 1. 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 2.5 cm enhancing tumor  with larger surrounding acute hemorrhage (hematoma volume now estimated at 82 mL and increased from approximately 64 mL at presentation). Moderate volume of intraventricular blood has progressed since presentation, but with stable ventricle size and configuration. Intracranial mass effect with rightward midline shift of 12 mL has mildly increased. Basilar cistern patency is stable. 2. Intermittently motion degraded exam, with no 2nd enhancing brain lesion is identified. 3. No other acute intracranial abnormality identified. Underlying moderate for age nonspecific cerebral white matter signal changes. Electronically Signed   By: VEAR Hurst M.D.   On: 06/03/2024 04:03   DG CHEST PORT 1 VIEW Result Date: 06/02/2024 CLINICAL DATA:  CHF.  Altered mental status.  Slurred speech. EXAM: PORTABLE CHEST 1 VIEW COMPARISON:  11/16/2023 FINDINGS: Mild cardiomegaly with vascular congestion. Interstitial prominence could reflect interstitial edema. Stable mild elevation of the right hemidiaphragm with right base atelectasis. No effusions. IMPRESSION: Cardiomegaly with vascular congestion. Suspect mild interstitial edema. Right base atelectasis. Electronically Signed   By: Franky Crease M.D.   On: 06/02/2024 22:22   CT ANGIO HEAD NECK W WO CM (CODE STROKE) Result Date: 06/02/2024 EXAM: CTA HEAD AND NECK WITHOUT AND WITH 06/02/2024 06:50:16 PM TECHNIQUE: CTA of the head and neck was performed without and with the administration of 75 mL of iohexol  (OMNIPAQUE ) 350 MG/ML injection. Multiplanar 2D and/or 3D reformatted images are provided for review. Automated exposure control, iterative reconstruction, and/or weight based adjustment of the mA/kV was utilized to reduce the radiation dose to as low as reasonably achievable. Stenosis of the internal carotid arteries measured using NASCET criteria. COMPARISON: None available CLINICAL HISTORY: Neuro deficit, acute, stroke suspected. 75mL Omni 350 IV. Code stroke. Aphasia; slurred speech.  LKW 0900; Michaela [phone]. FINDINGS: CTA NECK: AORTIC ARCH AND ARCH VESSELS: Atherosclerotic calcifications are present in the distal aortic arch. Grade vessel origins are within normal limits. No dissection or arterial injury. No significant stenosis of the brachiocephalic or subclavian arteries. CERVICAL CAROTID ARTERIES: Mild calcifications are present in the proximal ICA bilaterally. No significant stenoses are present. Moderate tortuosity is present in the left cervical ICA. No dissection or arterial injury. CERVICAL VERTEBRAL ARTERIES: No dissection, arterial injury, or significant stenosis. LUNGS AND MEDIASTINUM: Unremarkable. SOFT TISSUES: No acute abnormality. BONES: Multilevel degenerative changes are present in the cervical spine. CTA HEAD: ANTERIOR CIRCULATION: Atherosclerotic calcifications are present in the cavernous internal carotid arteries bilaterally. No significant stenosis of the internal carotid arteries. No significant stenosis of the anterior cerebral arteries. No significant stenosis of the middle cerebral arteries. No aneurysm. POSTERIOR CIRCULATION: No significant stenosis of the posterior cerebral arteries. No significant stenosis of the basilar artery. No significant stenosis of the vertebral arteries. No aneurysm. OTHER: No dural venous sinus thrombosis on this non-dedicated study. No vascular malformation or spot sign is associated with the hemorrhage. There does appear to be some vascularity within the area suspected to be a mass. IMPRESSION: 1. No large vessel occlusion, hemodynamically significant stenosis, or aneurysm in the head or neck. 2. Atherosclerotic calcifications in the distal aortic arch, cavernous internal carotid arteries bilaterally, and proximal ICA bilaterally without significant stenosis. Moderate tortuosity of the left cervical ICA. Electronically signed by: Lonni Necessary MD 06/02/2024 07:18 PM EDT RP Workstation: [id number]   CT HEAD CODE STROKE WO  CONTRAST` Result Date: 06/02/2024 EXAM: CT HEAD WITHOUT CONTRAST 06/02/2024 06:36:34  PM TECHNIQUE: CT of the head was performed without the administration of intravenous contrast. Automated exposure control, iterative reconstruction, and/or weight based adjustment of the mA/kV was utilized to reduce the radiation dose to as low as reasonably achievable. COMPARISON: None available. CLINICAL HISTORY: stroke suspected. Non con. Code stroke. Aphasia; slurred speech. LKW 0900; Michaela [phone]. FINDINGS: BRAIN AND VENTRICLES: Hemorrhage in the anterior left frontal lobe measures 4.3 x 6.2 x 4.2 cm. Estimated volume of the hemorrhage is 56 ml. Layering blood products are present. Within the anterior superior aspect of the hemorrhage is a more heterogeneous region suggesting an underlying mass which measures 3.4 x 1.9 cm. Marked surrounding vasogenic edema is present. Mass effect is present with 9 mm of midline shift. No other focal hemorrhage or mass lesion is present. No evidence of acute infarct. No hydrocephalus. No extra-axial collection. ORBITS: No acute abnormality. SINUSES: Chronic opacification of the right maxillary sinus present. A small fluid level is present in the left maxillary sinus. Mucosal thickening is present in the anterior ethmoid air cells and inferior frontal sinuses bilaterally. SOFT TISSUES AND SKULL: No acute soft tissue abnormality. No skull fracture. IMPRESSION: 1. Hemorrhage in the anterior left frontal lobe measuring 4.3 x 6.2 x 4.2 cm (estimated volume 56.0 mL) with layering blood products and a heterogeneous region in the anterior superior aspect suggesting an underlying mass measuring 3.4 x 1.9 cm. MRI of the head without and with contrast may be useful for further evaluation. 2. Marked surrounding vasogenic edema and mass effect with 9 mm of midline shift. Critical values were communicated to Dr. Michaela at 6:48 via the [facility] system Electronically signed by: Lonni Necessary  MD 06/02/2024 06:48 PM EDT RP Workstation: [id number]    Vitals:   06/03/24 1100 06/03/24 1200 06/03/24 1300 06/03/24 1400  BP: 131/89 133/61 (!) 141/57 (!) 144/67  Pulse: 80 (!) 54 65 (!) 57  Resp: (!) 31 (!) 26 (!) 27 (!) 31  Temp:  99.6 F (37.6 C)    TempSrc:  Axillary    SpO2: 93% 92% 92% 93%  Weight:      Height:         PHYSICAL EXAM General:  Alert, well-nourished, well-developed Justin Rasmussen in no acute distress Psych:  Mood and affect appropriate for situation CV: Regular rate and rhythm on monitor Respiratory:  Regular, unlabored respirations on room air   NEURO:  Mental Status: Justin Rasmussen is alert, responds to name and is intermittently able to follow commands Speech/Language: No verbal output  Cranial Nerves:  II: PERRL.   III, IV, VI: EOMI. Eyelids elevate symmetrically.  VII: Right facial droop VIII: hearing intact to voice. XII: tongue is midline without fasciculations. Motor: Moves all 4 extremities with antigravity strength but left side stronger than right, drift present in right arm Tone: is normal and bulk is normal Sensation- Intact to light touch bilaterally.  Coordination: FTN unable to perform Gait- deferred  Most Recent NIH  1a Level of Conscious.: 0 1b LOC Questions: 2 1c LOC Commands: 0 2 Best Gaze: 0 3 Visual: 0 4 Facial Palsy: 1 5a Motor Arm - left: 0 5b Motor Arm - Right: 1 6a Motor Leg - Left: 0 6b Motor Leg - Right: 0 7 Limb Ataxia: 0 9 Best Language: 3 10 Dysarthria: 2 11 Extinct. and Inatten.: 0 TOTAL: 9   ASSESSMENT/PLAN  Justin Rasmussen is a 68 y.o. male with history of melanoma on immunotherapy admitted for right-sided weakness and aphasia.  He was found to  have a left frontal ICH on CT, and MRI revealed a hemorrhagic tumor.  He has been seen by neurosurgery and started on Decadron  for edema with operative intervention planned for this week.  Discussed CODE STATUS with Justin Rasmussen's wife, who is in agreement with intubation for  only a limited period of time.  She states Justin Rasmussen would not wish to be on life support for a prolonged period.  NIH on Admission 8  ICH score 1  ICH:  left frontal ICH, etiology: Hemorrhagic mass, likely from metastatic melanoma Code Stroke CT head hemorrhage in the anterior left frontal lobe with possible underlying mass and marked surrounding vasogenic edema with 9 mm of midline shift CTA head & neck no LVO, hemodynamically significant stenosis or aneurysm MRI hemorrhagic tumor of the anterior left frontal lobe with 5 cm enhancing tumor with larger surrounding acute hemorrhage, moderate volume of the intraventricular blood, 12 mm midline shift Follow-up CT 9/28: Stable left frontal lobe hemorrhagic mass measuring up to 6 cm with associated vasogenic edema and midline shift of up to 11 mm. 2D Echo pending LDL 93 HgbA1c 5.5 VTE prophylaxis - SCDs aspirin  81 mg daily prior to admission, now on No antithrombotic due to ICH Therapy recommendations: Pending Disposition: Pending  Cerebral edema (brain compression) Cerebral edema seen on head CT and MRI with 12 mm of midline shift Decadron  initiated by neurosurgery for edema due to tumor 3% saline for cytotoxic edema at 50 cc/h Goal sodium 150-155 Na 145->143  Hemorrhagic tumor, likely metastatic melanoma Justin Rasmussen has history of melanoma of right foot, resected last year with positive lymph nodes, currently on immunotherapy Hemorrhagic tumor seen on MRI Neurosurgery consulted On Keppra IV twice daily Pending family discussion with operative intervention planned later this week  Hypertension Home meds: Carvedilol  6.25 mg twice daily Stable Hold off coreg  due to bradycardia BP goal less than 160 Long-term BP goal normotensive  Hyperlipidemia Home meds: Rosuvastatin  10 mg daily LDL 93, goal < 70 Resume statin at discharge and consider increase to 20 mg  Other Stroke Risk Factors Obesity, Body mass index is 47.68 kg/m., BMI >/= 30  associated with increased stroke risk, recommend weight loss, diet and exercise as appropriate  History of congestive heart failure OSA  Other Active Problems Pulmonary hypertension AKI, creatinine 1.26-1.43  Hospital day # 1  Justin Rasmussen seen by NP with MD, MD to edit note as needed. Cortney E Everitt Clint Kill , MSN, AGACNP-BC [facility] See Amion for schedule and pager information 06/03/2024 3:35 PM  ATTENDING NOTE: I reviewed above note and agree with the assessment and plan. Pt was seen and examined.   Wife and daughter are at bedside.  Justin Rasmussen lying bed, awake, alert, eyes open.  Expressive aphasia with mumbling words but not meaningful.  Able to follow some simple commands like I close opening, showing fingers and make fist on the right hand, however not following other simple commands.  Blinking to visual threat bilaterally, tracking bilaterally, right facial droop.  Able to open mouth but not tongue protrusion.  Right upper extremity drift to bed within 10 seconds, left upper limb no drift, bilateral lower extremity symptoms symmetrical in strength. Sensation, coordination not corporative and gait not tested.  For detailed assessment and plan, please refer to above as I have made changes wherever appropriate.   Ary Cummins, MD PhD Stroke Neurology 06/03/2024 9:51 PM  This Justin Rasmussen is critically ill due to left ICH, small SAH and IVH, left frontal metastatic melanoma, cerebral edema,  AKI and at significant risk of neurological worsening, death form brain herniation, hydrocephalus, further hemorrhage from tumor, renal failure and respite failure. This Justin Rasmussen's care requires constant monitoring of vital signs, hemodynamics, respiratory and cardiac monitoring, review of multiple databases, neurological assessment, discussion with family, other specialists and medical decision making of high complexity. I spent 45 minutes of neurocritical care time in the care of this Justin Rasmussen. I had  long discussion with wife at bedside, updated pt current condition, treatment plan and potential prognosis, and answered all the questions.  She expressed understanding and appreciation.        To contact Stroke Continuity provider, please refer to [email or web address]. After hours, contact General Neurology

## 2024-06-03 NOTE — Progress Notes (Signed)
Placed patient on home CPAP for the night with oxygen set at 4lpm.  

## 2024-06-03 NOTE — Progress Notes (Addendum)
 STROKE TEAM PROGRESS NOTE    SIGNIFICANT HOSPITAL EVENTS 9/27: Patient presented with right-sided weakness and aphasia, found to have left frontal ICH. 9/28: MRI reveals hemorrhagic tumor in the anterior left frontal lobe, neurosurgery consulted  INTERIM HISTORY/SUBJECTIVE  Patient remains hemodynamically stable and afebrile overnight.  He continues to have expressive and receptive aphasia.  Neurosurgery has seen patient and plans for operative intervention soon.  OBJECTIVE  CBC    Component Value Date/Time   WBC 10.4 06/03/2024 0551   RBC 4.79 06/03/2024 0551   HGB 12.7 (L) 06/03/2024 0551   HCT 40.0 06/03/2024 0551   PLT 322 06/03/2024 0551   MCV 83.5 06/03/2024 0551   MCH 26.5 06/03/2024 0551   MCHC 31.8 06/03/2024 0551   RDW 15.7 (H) 06/03/2024 0551   LYMPHSABS 0.9 06/02/2024 1815   MONOABS 0.6 06/02/2024 1815   EOSABS 0.1 06/02/2024 1815   BASOSABS 0.0 06/02/2024 1815    BMET    Component Value Date/Time   NA 145 06/03/2024 1111   NA 141 11/10/2021 1022   K 3.8 06/03/2024 0551   CL 103 06/03/2024 0551   CO2 26 06/03/2024 0551   GLUCOSE 168 (H) 06/03/2024 0551   BUN 20 06/03/2024 0551   BUN 18 11/10/2021 1022   CREATININE 1.43 (H) 06/03/2024 0551   CALCIUM  8.7 (L) 06/03/2024 0551   EGFR 76 11/10/2021 1022   GFRNONAA 54 (L) 06/03/2024 0551    IMAGING past 24 hours MR BRAIN W WO CONTRAST Result Date: 06/03/2024 CLINICAL DATA:  68 year old male code stroke presentation yesterday with large left frontal lobe intra-axial hemorrhage. EXAM: MRI HEAD WITHOUT AND WITH CONTRAST TECHNIQUE: Multiplanar, multiecho pulse sequences of the brain and surrounding structures were obtained without and with intravenous contrast. CONTRAST:  10mL GADAVIST  GADOBUTROL  1 MMOL/ML IV SOLN COMPARISON:  CT head, CTA head and neck yesterday. FINDINGS: Study is intermittently degraded by motion artifact despite repeated imaging attempts. Brain: Large in heterogeneous intra-axial hemorrhage with  layering hematocrit level in the anterior left frontal lobe, blood products encompassing roughly 65 by 55 by 46 mm on MRI now (AP by transverse by CC) for an estimated blood volume 82 mL. This compares to approximately 64 mL on the presentation CT when measured with the same technique. Surrounding T2 and FLAIR hyperintense edema. Following contrast there is rounded, masslike enhancement along the anterior superior margin of the hemorrhage in an area of 25 x 22 x 18 mm (series 19, image 20 and series 20, image 24. That lesion also appears different from the remaining hematoma on DWI. No 2nd enhancing brain lesion is identified. No dural thickening. Intraventricular blood volume is moderate and increased since the presentation CT. Ventricle size and configuration not significantly changed. No significant subarachnoid or other extra-axial blood component is identified. Basilar cistern patency appears stable from the CTA. Rightward midline shift at the anterior septum pellucidum is 12 mm and mildly increased since presentation. Blood products related susceptibility on DWI. No larger area of diffusion restriction. No restricted diffusion elsewhere. On SWI no chronic cerebral blood products identified. Scattered white matter T2 and FLAIR hyperintensity on the late it to the acute edema surrounding the left frontal lobe hematoma. Mild T2 heterogeneity also in the central pons. No cortical encephalomalacia identified. Pituitary and cervicomedullary junction appear negative. Cerebellum appears negative. Vascular: Major intracranial vascular flow voids are preserved. Skull and upper cervical spine: Negative visible cervical spine. Visualized bone marrow signal is within normal limits. Sinuses/Orbits: Negative orbits. Stable paranasal sinus disease as  seen by CT. Other: Mild mastoid effusion also stable. Negative visible scalp and face. IMPRESSION: 1. 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 2.5 cm enhancing tumor  with larger surrounding acute hemorrhage (hematoma volume now estimated at 82 mL and increased from approximately 64 mL at presentation). Moderate volume of intraventricular blood has progressed since presentation, but with stable ventricle size and configuration. Intracranial mass effect with rightward midline shift of 12 mL has mildly increased. Basilar cistern patency is stable. 2. Intermittently motion degraded exam, with no 2nd enhancing brain lesion is identified. 3. No other acute intracranial abnormality identified. Underlying moderate for age nonspecific cerebral white matter signal changes. Electronically Signed   By: VEAR Hurst M.D.   On: 06/03/2024 04:03   DG CHEST PORT 1 VIEW Result Date: 06/02/2024 CLINICAL DATA:  CHF.  Altered mental status.  Slurred speech. EXAM: PORTABLE CHEST 1 VIEW COMPARISON:  11/16/2023 FINDINGS: Mild cardiomegaly with vascular congestion. Interstitial prominence could reflect interstitial edema. Stable mild elevation of the right hemidiaphragm with right base atelectasis. No effusions. IMPRESSION: Cardiomegaly with vascular congestion. Suspect mild interstitial edema. Right base atelectasis. Electronically Signed   By: Franky Crease M.D.   On: 06/02/2024 22:22   CT ANGIO HEAD NECK W WO CM (CODE STROKE) Result Date: 06/02/2024 EXAM: CTA HEAD AND NECK WITHOUT AND WITH 06/02/2024 06:50:16 PM TECHNIQUE: CTA of the head and neck was performed without and with the administration of 75 mL of iohexol  (OMNIPAQUE ) 350 MG/ML injection. Multiplanar 2D and/or 3D reformatted images are provided for review. Automated exposure control, iterative reconstruction, and/or weight based adjustment of the mA/kV was utilized to reduce the radiation dose to as low as reasonably achievable. Stenosis of the internal carotid arteries measured using NASCET criteria. COMPARISON: None available CLINICAL HISTORY: Neuro deficit, acute, stroke suspected. 75mL Omni 350 IV. Code stroke. Aphasia; slurred speech.  LKW 0900; Michaela [phone]. FINDINGS: CTA NECK: AORTIC ARCH AND ARCH VESSELS: Atherosclerotic calcifications are present in the distal aortic arch. Grade vessel origins are within normal limits. No dissection or arterial injury. No significant stenosis of the brachiocephalic or subclavian arteries. CERVICAL CAROTID ARTERIES: Mild calcifications are present in the proximal ICA bilaterally. No significant stenoses are present. Moderate tortuosity is present in the left cervical ICA. No dissection or arterial injury. CERVICAL VERTEBRAL ARTERIES: No dissection, arterial injury, or significant stenosis. LUNGS AND MEDIASTINUM: Unremarkable. SOFT TISSUES: No acute abnormality. BONES: Multilevel degenerative changes are present in the cervical spine. CTA HEAD: ANTERIOR CIRCULATION: Atherosclerotic calcifications are present in the cavernous internal carotid arteries bilaterally. No significant stenosis of the internal carotid arteries. No significant stenosis of the anterior cerebral arteries. No significant stenosis of the middle cerebral arteries. No aneurysm. POSTERIOR CIRCULATION: No significant stenosis of the posterior cerebral arteries. No significant stenosis of the basilar artery. No significant stenosis of the vertebral arteries. No aneurysm. OTHER: No dural venous sinus thrombosis on this non-dedicated study. No vascular malformation or spot sign is associated with the hemorrhage. There does appear to be some vascularity within the area suspected to be a mass. IMPRESSION: 1. No large vessel occlusion, hemodynamically significant stenosis, or aneurysm in the head or neck. 2. Atherosclerotic calcifications in the distal aortic arch, cavernous internal carotid arteries bilaterally, and proximal ICA bilaterally without significant stenosis. Moderate tortuosity of the left cervical ICA. Electronically signed by: Lonni Necessary MD 06/02/2024 07:18 PM EDT RP Workstation: [id number]   CT HEAD CODE STROKE WO  CONTRAST` Result Date: 06/02/2024 EXAM: CT HEAD WITHOUT CONTRAST 06/02/2024 06:36:34  PM TECHNIQUE: CT of the head was performed without the administration of intravenous contrast. Automated exposure control, iterative reconstruction, and/or weight based adjustment of the mA/kV was utilized to reduce the radiation dose to as low as reasonably achievable. COMPARISON: None available. CLINICAL HISTORY: stroke suspected. Non con. Code stroke. Aphasia; slurred speech. LKW 0900; Michaela [phone]. FINDINGS: BRAIN AND VENTRICLES: Hemorrhage in the anterior left frontal lobe measures 4.3 x 6.2 x 4.2 cm. Estimated volume of the hemorrhage is 56 ml. Layering blood products are present. Within the anterior superior aspect of the hemorrhage is a more heterogeneous region suggesting an underlying mass which measures 3.4 x 1.9 cm. Marked surrounding vasogenic edema is present. Mass effect is present with 9 mm of midline shift. No other focal hemorrhage or mass lesion is present. No evidence of acute infarct. No hydrocephalus. No extra-axial collection. ORBITS: No acute abnormality. SINUSES: Chronic opacification of the right maxillary sinus present. A small fluid level is present in the left maxillary sinus. Mucosal thickening is present in the anterior ethmoid air cells and inferior frontal sinuses bilaterally. SOFT TISSUES AND SKULL: No acute soft tissue abnormality. No skull fracture. IMPRESSION: 1. Hemorrhage in the anterior left frontal lobe measuring 4.3 x 6.2 x 4.2 cm (estimated volume 56.0 mL) with layering blood products and a heterogeneous region in the anterior superior aspect suggesting an underlying mass measuring 3.4 x 1.9 cm. MRI of the head without and with contrast may be useful for further evaluation. 2. Marked surrounding vasogenic edema and mass effect with 9 mm of midline shift. Critical values were communicated to Dr. Michaela at 6:48 via the [facility] system Electronically signed by: Lonni Necessary  MD 06/02/2024 06:48 PM EDT RP Workstation: [id number]    Vitals:   06/03/24 1100 06/03/24 1200 06/03/24 1300 06/03/24 1400  BP: 131/89 133/61 (!) 141/57 (!) 144/67  Pulse: 80 (!) 54 65 (!) 57  Resp: (!) 31 (!) 26 (!) 27 (!) 31  Temp:  99.6 F (37.6 C)    TempSrc:  Axillary    SpO2: 93% 92% 92% 93%  Weight:      Height:         PHYSICAL EXAM General:  Alert, well-nourished, well-developed patient in no acute distress Psych:  Mood and affect appropriate for situation CV: Regular rate and rhythm on monitor Respiratory:  Regular, unlabored respirations on room air   NEURO:  Mental Status: Patient is alert, responds to name and is intermittently able to follow commands Speech/Language: No verbal output  Cranial Nerves:  II: PERRL.   III, IV, VI: EOMI. Eyelids elevate symmetrically.  VII: Right facial droop VIII: hearing intact to voice. XII: tongue is midline without fasciculations. Motor: Moves all 4 extremities with antigravity strength but left side stronger than right, drift present in right arm Tone: is normal and bulk is normal Sensation- Intact to light touch bilaterally.  Coordination: FTN unable to perform Gait- deferred  Most Recent NIH  1a Level of Conscious.: 0 1b LOC Questions: 2 1c LOC Commands: 0 2 Best Gaze: 0 3 Visual: 0 4 Facial Palsy: 1 5a Motor Arm - left: 0 5b Motor Arm - Right: 1 6a Motor Leg - Left: 0 6b Motor Leg - Right: 0 7 Limb Ataxia: 0 9 Best Language: 3 10 Dysarthria: 2 11 Extinct. and Inatten.: 0 TOTAL: 9   ASSESSMENT/PLAN  [name] is a 68 y.o. male with history of melanoma on immunotherapy admitted for right-sided weakness and aphasia.  He was found to  have a left frontal ICH on CT, and MRI revealed a hemorrhagic tumor.  He has been seen by neurosurgery and started on Decadron  for edema with operative intervention planned for this week.  Discussed CODE STATUS with patient's wife, who is in agreement with intubation for  only a limited period of time.  She states patient would not wish to be on life support for a prolonged period.  NIH on Admission 8  ICH score 1  ICH:  left frontal ICH, etiology: Hemorrhagic mass, likely from metastatic melanoma Code Stroke CT head hemorrhage in the anterior left frontal lobe with possible underlying mass and marked surrounding vasogenic edema with 9 mm of midline shift CTA head & neck no LVO, hemodynamically significant stenosis or aneurysm MRI hemorrhagic tumor of the anterior left frontal lobe with 5 cm enhancing tumor with larger surrounding acute hemorrhage, moderate volume of the intraventricular blood, 12 mm midline shift Follow-up CT 9/28: Stable left frontal lobe hemorrhagic mass measuring up to 6 cm with associated vasogenic edema and midline shift of up to 11 mm. 2D Echo pending LDL 93 HgbA1c 5.5 VTE prophylaxis - SCDs aspirin  81 mg daily prior to admission, now on No antithrombotic due to ICH Therapy recommendations: Pending Disposition: Pending  Cerebral edema (brain compression) Cerebral edema seen on head CT and MRI with 12 mm of midline shift Decadron  initiated by neurosurgery for edema due to tumor 3% saline for cytotoxic edema at 50 cc/h Goal sodium 150-155 Na 145->143  Hemorrhagic tumor, likely metastatic melanoma Patient has history of melanoma of right foot, resected last year with positive lymph nodes, currently on immunotherapy Hemorrhagic tumor seen on MRI Neurosurgery consulted Pending family discussion with operative intervention planned later this week  Hypertension Home meds: Carvedilol  6.25 mg twice daily Stable Hold off coreg  due to bradycardia BP goal less than 160 Long-term BP goal normotensive  Hyperlipidemia Home meds: Rosuvastatin  10 mg daily LDL 93, goal < 70 Resume statin at discharge and consider increase to 20 mg  Other Stroke Risk Factors Obesity, Body mass index is 47.68 kg/m., BMI >/= 30 associated with increased  stroke risk, recommend weight loss, diet and exercise as appropriate  Congestive heart failure OSA  Other Active Problems Pulmonary hypertension AKI, creatinine 1.26-1.43  Hospital day # 1  Patient seen by NP with MD, MD to edit note as needed. Cortney E Everitt Clint Kill , MSN, AGACNP-BC [facility] See Amion for schedule and pager information 06/03/2024 3:35 PM  ATTENDING NOTE: I reviewed above note and agree with the assessment and plan. Pt was seen and examined.   Wife and daughter are at bedside.  Patient lying bed, awake, alert, eyes open.  Expressive aphasia with mumbling words but not meaningful.  Able to follow some simple commands like I close opening, showing fingers and make fist on the right hand, however not following other simple commands.  Blinking to visual threat bilaterally, tracking bilaterally, right facial droop.  Able to open mouth but not tongue protrusion.  Right upper extremity drift to bed within 10 seconds, left upper limb no drift, bilateral lower extremity symptoms symmetrical in strength. Sensation, coordination not corporative and gait not tested.  For detailed assessment and plan, please refer to above as I have made changes wherever appropriate.   Ary Cummins, MD PhD Stroke Neurology 06/03/2024 9:51 PM  This patient is critically ill due to left ICH, small SAH and IVH, left frontal metastatic melanoma, cerebral edema, AKI and at significant risk of neurological  worsening, death form brain herniation, hydrocephalus, further hemorrhage from tumor, renal failure and respite failure. This patient's care requires constant monitoring of vital signs, hemodynamics, respiratory and cardiac monitoring, review of multiple databases, neurological assessment, discussion with family, other specialists and medical decision making of high complexity. I spent 45 minutes of neurocritical care time in the care of this patient. I had long discussion with wife at bedside,  updated pt current condition, treatment plan and potential prognosis, and answered all the questions.  She expressed understanding and appreciation.        To contact Stroke Continuity provider, please refer to [email or web address]. After hours, contact General Neurology

## 2024-06-03 NOTE — Plan of Care (Signed)
 Pt alert with expressive aphasia and some mild receptive aphasia.  VSS.  CT at [facility] pending.  Approved by Speech Therapy for a diet.  Voiding.  Family attentive at bedside.

## 2024-06-03 NOTE — Evaluation (Signed)
 Clinical/Bedside Swallow Evaluation Patient Details  Name: Justin Rasmussen MRN: [id number] Date of Birth: [birth year]  Today's Date: 06/03/2024 Time: SLP Start Time (ACUTE ONLY): 9065 SLP Stop Time (ACUTE ONLY): 0943 SLP Time Calculation (min) (ACUTE ONLY): 9 min  Past Medical History:  Past Medical History:  Diagnosis Date   2019 novel coronavirus disease (COVID-19) 07/2019   COPD (chronic obstructive pulmonary disease) (HCC)    HFrEF (heart failure with reduced ejection fraction) (HCC)    Melanoma (HCC)    Nonischemic cardiomyopathy (HCC)    OSA (obstructive sleep apnea)    Pulmonary hypertension (HCC)    WHO group II   Past Surgical History:  Past Surgical History:  Procedure Laterality Date   ADENOIDECTOMY     RIGHT/LEFT HEART CATH AND CORONARY ANGIOGRAPHY N/A 06/05/2019   Procedure: RIGHT/LEFT HEART CATH AND CORONARY ANGIOGRAPHY;  Surgeon: Dann Candyce RAMAN, MD;  Location: [facility] INVASIVE CV LAB;  Service: Cardiovascular;  Laterality: N/A;   HPI:  Pt is a 68 yo male presenting to ED 9/27 with R sided weakness and aphasia. CTH shows large L frontal IPH. MRI shows 2.5 cm enhancing tumor with surrounding hemorrhage and 12 mm midline shift (hematoma volume now estimated at 82 mL and increased from approximately 64 mL at presentation). PMH: pulmonary HTN, HLD, OSA on CPAP at night, HFrEF, COPD, melanoma currently on nivolumab     Assessment / Plan / Recommendation  Clinical Impression  Pt sitting upright in chair position. He followed ~50% of one step commands to complete oral motor exam, revealing mild R CN VII deficits. Pt typically wears dentures but they are not currently present. Given set up, pt fed himself purees and bite-sized solids with brief oral holding before initiating a swallow. Eventual oral clearance was complete. Observed sips of water in addition to the 3 oz water test without signs clinically concerning for aspiration. Pt's family states they will bring his dentures.  Recomemnd resuming regular diet with thin liquids. Use discretion when ordering until dentures are available. SLP will f/u at least briefly. SLP Visit Diagnosis: Dysphagia, unspecified (R13.10)    Aspiration Risk  Mild aspiration risk    Diet Recommendation Regular;Thin liquid    Liquid Administration via: Cup;Straw Medication Administration: Whole meds with liquid Supervision: Staff to assist with self feeding;Intermittent supervision to cue for compensatory strategies Compensations: Minimize environmental distractions;Slow rate;Small sips/bites Postural Changes: Seated upright at 90 degrees    Other  Recommendations Oral Care Recommendations: Oral care BID     Assistance Recommended at Discharge    Functional Status Assessment Patient has had a recent decline in their functional status and demonstrates the ability to make significant improvements in function in a reasonable and predictable amount of time.  Frequency and Duration min 2x/week  2 weeks       Prognosis Prognosis for improved oropharyngeal function: Good Barriers to Reach Goals: Language deficits      Swallow Study   General HPI: Pt is a 68 yo male presenting to ED 9/27 with R sided weakness and aphasia. CTH shows large L frontal IPH. MRI shows 2.5 cm enhancing tumor with surrounding hemorrhage and 12 mm midline shift (hematoma volume now estimated at 82 mL and increased from approximately 64 mL at presentation). PMH: pulmonary HTN, HLD, OSA on CPAP at night, HFrEF, COPD, melanoma currently on nivolumab  Type of Study: Bedside Swallow Evaluation Previous Swallow Assessment: none in chart Diet Prior to this Study: NPO Temperature Spikes Noted: No Respiratory Status: Room air History of Recent Intubation:  No Behavior/Cognition: Alert;Cooperative;Pleasant mood Oral Cavity Assessment: Within Functional Limits Oral Care Completed by SLP: No Oral Cavity - Dentition: Edentulous;Dentures, not available Vision:  Functional for self-feeding Self-Feeding Abilities: Able to feed self;Needs set up Patient Positioning: Upright in bed Baseline Vocal Quality: Not observed Volitional Cough: Cognitively unable to elicit Volitional Swallow: Able to elicit    Oral/Motor/Sensory Function Overall Oral Motor/Sensory Function: Mild impairment Facial ROM: Reduced right;Suspected CN VII (facial) dysfunction Facial Symmetry: Abnormal symmetry right;Suspected CN VII (facial) dysfunction Facial Strength: Reduced right;Suspected CN VII (facial) dysfunction Lingual ROM: Within Functional Limits Lingual Symmetry: Within Functional Limits Lingual Strength: Within Functional Limits   Ice Chips Ice chips: Not tested   Thin Liquid Thin Liquid: Within functional limits Presentation: Straw;Self Fed    Nectar Thick Nectar Thick Liquid: Not tested   Honey Thick Honey Thick Liquid: Not tested   Puree Puree: Impaired Presentation: Spoon;Self Fed Oral Phase Functional Implications: Oral holding   Solid     Solid: Within functional limits Presentation: Self Fed      Damien Blumenthal, M.A., CCC-SLP Speech Language Pathology, Acute Rehabilitation Services  Secure Chat preferred [phone]  06/03/2024,10:49 AM

## 2024-06-03 NOTE — Consult Note (Signed)
 Reason for Consult: Left frontal hemorrhagic mass Referring Physician: Critical care neurology  Justin Rasmussen is an 68 y.o. male.  HPI: 68 year old gentleman with a history of melanoma currently in the process of getting immunotherapy presented with altered mental status and initial CT scan showing a left frontal hemorrhage.  Subsequent MRI scan showed underlying enhancement and mass.  With the patient's history very likely this represents metastatic melanoma currently patient is awake he is globally aphasic.    Past Medical History:  Diagnosis Date   2019 novel coronavirus disease (COVID-19) 07/2019   COPD (chronic obstructive pulmonary disease) (HCC)    HFrEF (heart failure with reduced ejection fraction) (HCC)    Melanoma (HCC)    Nonischemic cardiomyopathy (HCC)    OSA (obstructive sleep apnea)    Pulmonary hypertension (HCC)    WHO group II    Past Surgical History:  Procedure Laterality Date   ADENOIDECTOMY     RIGHT/LEFT HEART CATH AND CORONARY ANGIOGRAPHY N/A 06/05/2019   Procedure: RIGHT/LEFT HEART CATH AND CORONARY ANGIOGRAPHY;  Surgeon: Dann Candyce RAMAN, MD;  Location: [facility];  Service: Cardiovascular;  Laterality: N/A;    Family History  Problem Relation Age of Onset   Kidney disease Mother    Diabetes Father    Heart attack Father    Diabetes Sister    Heart disease Sister     Social History:  reports that he quit smoking about 35 years ago. His smoking use included cigarettes. He started smoking about 50 years ago. He has a 15 pack-year smoking history. He has never used smokeless tobacco. He reports that he does not currently use alcohol. He reports that he does not use drugs.  Allergies: No Known Allergies  Medications: I have reviewed the patient's current medications.  Results for orders placed or performed during the hospital encounter of 06/02/24 (from the past 48 hours)  Protime-INR     Status: Abnormal   Collection Time: 06/02/24  6:15 PM   Result Value Ref Range   Prothrombin Time 15.7 (H) 11.4 - 15.2 seconds   INR 1.2 0.8 - 1.2    Comment: (NOTE) INR goal varies based on device and disease states. Performed at [facility], [address] [place], [place] [address]   APTT     Status: Abnormal   Collection Time: 06/02/24  6:15 PM  Result Value Ref Range   aPTT 38 (H) 24 - 36 seconds    Comment:        IF BASELINE aPTT IS ELEVATED, SUGGEST PATIENT RISK ASSESSMENT BE USED TO DETERMINE APPROPRIATE ANTICOAGULANT THERAPY. Performed at [facility], [address] [place], [place] [address]   CBC     Status: Abnormal   Collection Time: 06/02/24  6:15 PM  Result Value Ref Range   WBC 8.7 4.0 - 10.5 K/uL   RBC 4.78 4.22 - 5.81 MIL/uL   Hemoglobin 12.3 (L) 13.0 - 17.0 g/dL   HCT 60.0 60.9 - 47.9 %   MCV 83.5 80.0 - 100.0 fL   MCH 25.7 (L) 26.0 - 34.0 pg   MCHC 30.8 30.0 - 36.0 g/dL   RDW 84.2 (H) 88.4 - 84.4 %   Platelets 342 150 - 400 K/uL   nRBC 0.0 0.0 - 0.2 %    Comment: Performed at [facility], [address], [place], [place] [address]  Differential     Status: None   Collection Time: 06/02/24  6:15 PM  Result Value Ref Range   Neutrophils Relative % 81 %   Neutro Abs 7.0 1.7 - 7.7 K/uL   Lymphocytes Relative 11 %   Lymphs Abs 0.9 0.7 - 4.0 K/uL   Monocytes Relative 7 %   Monocytes Absolute 0.6 0.1 - 1.0 K/uL   Eosinophils Relative 1 %   Eosinophils Absolute 0.1 0.0 - 0.5 K/uL   Basophils Relative 0 %   Basophils Absolute 0.0 0.0 - 0.1 K/uL   Immature Granulocytes 0 %   Abs Immature Granulocytes 0.03 0.00 - 0.07 K/uL    Comment: Performed at [facility], [address] [place], [place] [address]  Comprehensive metabolic panel     Status: Abnormal   Collection Time: 06/02/24  6:15 PM  Result Value Ref Range   Sodium 140 135 - 145 mmol/L   Potassium 3.8 3.5 - 5.1 mmol/L   Chloride 99 98 - 111 mmol/L   CO2 27 22 - 32 mmol/L   Glucose, Bld 127 (H) 70 - 99 mg/dL     Comment: Glucose reference range applies only to samples taken after fasting for at least 8 hours.   BUN 16 8 - 23 mg/dL   Creatinine, Ser 8.73 (H) 0.61 - 1.24 mg/dL   Calcium  8.9 8.9 - 10.3 mg/dL   Total Protein 7.1 6.5 - 8.1 g/dL   Albumin 3.5 3.5 - 5.0 g/dL   AST 21 15 - 41 U/L   ALT 13 0 - 44 U/L   Alkaline Phosphatase 46 38 - 126 U/L   Total Bilirubin 0.7 0.0 - 1.2 mg/dL   GFR, Estimated >39 >39 mL/min    Comment: (NOTE) Calculated using the CKD-EPI Creatinine Equation (2021)    Anion gap 14 5 - 15    Comment: Performed at [facility], [address] [place], [place] [address]  Ethanol     Status: None   Collection Time: 06/02/24  6:15 PM  Result Value Ref Range   Alcohol, Ethyl (B) <15 <15 mg/dL    Comment: (NOTE) For medical purposes only. Performed at [facility], [address] [place], [place] [address]   I-stat chem 8, ED     Status: Abnormal   Collection Time: 06/02/24  6:18 PM  Result Value Ref Range   Sodium 141 135 - 145 mmol/L   Potassium 3.8 3.5 - 5.1 mmol/L   Chloride 100 98 - 111 mmol/L   BUN 17 8 - 23 mg/dL   Creatinine, Ser 8.69 (H) 0.61 - 1.24 mg/dL   Glucose, Bld 869 (H) 70 - 99 mg/dL    Comment: Glucose reference range applies only to samples taken after fasting for at least 8 hours.   Calcium , Ion 1.09 (L) 1.15 - 1.40 mmol/L   TCO2 29 22 - 32 mmol/L   Hemoglobin 12.9 (L) 13.0 - 17.0 g/dL   HCT 61.9 (L) 60.9 - 47.9 %  CBG monitoring, ED     Status: Abnormal   Collection Time: 06/02/24  6:53 PM  Result Value Ref Range   Glucose-Capillary 124 (H) 70 - 99 mg/dL    Comment: Glucose reference range applies only to samples taken after fasting for at least 8 hours.  MRSA Next Gen by PCR, Nasal     Status: Abnormal   Collection Time: 06/02/24  8:49 PM   Specimen: Nasal Mucosa; Nasal Swab  Result Value Ref Range   MRSA by PCR Next Gen DETECTED (A) NOT DETECTED  Comment: RESULT CALLED TO, READ BACK BY AND VERIFIED WITH: TIRRELL RN  06/02/2024 @ 2325 BY AB (NOTE) The GeneXpert MRSA Assay (FDA approved for NASAL specimens only), is one component of a comprehensive MRSA colonization surveillance program. It is not intended to diagnose MRSA infection nor to guide or monitor treatment for MRSA infections. Test performance is not FDA approved in patients less than 28 years old. Performed at [facility], [address] [place], [place] [address]   Hemoglobin A1c     Status: None   Collection Time: 06/02/24 10:35 PM  Result Value Ref Range   Hgb A1c MFr Bld 5.5 4.8 - 5.6 %    Comment: (NOTE) Diagnosis of Diabetes The following HbA1c ranges recommended by the American Diabetes Association (ADA) may be used as an aid in the diagnosis of diabetes mellitus.  Hemoglobin             Suggested A1C NGSP%              Diagnosis  <5.7                   Non Diabetic  5.7-6.4                Pre-Diabetic  >6.4                   Diabetic  <7.0                   Glycemic control for                       adults with diabetes.     Mean Plasma Glucose 111.15 mg/dL    Comment: Performed at [facility], [address], [place], [place] [address]  Lipid panel     Status: Abnormal   Collection Time: 06/02/24 10:35 PM  Result Value Ref Range   Cholesterol 141 0 - 200 mg/dL   Triglycerides 98 <849 mg/dL   HDL 28 (L) >59 mg/dL   Total CHOL/HDL Ratio 5.0 RATIO   VLDL 20 0 - 40 mg/dL   LDL Cholesterol 93 0 - 99 mg/dL    Comment:        Total Cholesterol/HDL:CHD Risk Coronary Heart Disease Risk Table                     Men   Women  1/2 Average Risk   3.4   3.3  Average Risk       5.0   4.4  2 X Average Risk   9.6   7.1  3 X Average Risk  23.4   11.0        Use the calculated Patient Ratio above and the CHD Risk Table to determine the patient's CHD Risk.        ATP III CLASSIFICATION (LDL):  <100     mg/dL   Optimal  899-870  mg/dL   Near or Above                    Optimal  130-159  mg/dL   Borderline   839-810  mg/dL   High  >809     mg/dL   Very High Performed at [facility], [address] [place], [place] [address]   Glucose, capillary     Status: Abnormal   Collection Time: 06/02/24 11:06 PM  Result Value Ref Range  Glucose-Capillary 141 (H) 70 - 99 mg/dL    Comment: Glucose reference range applies only to samples taken after fasting for at least 8 hours.  Glucose, capillary     Status: Abnormal   Collection Time: 06/03/24  4:22 AM  Result Value Ref Range   Glucose-Capillary 165 (H) 70 - 99 mg/dL    Comment: Glucose reference range applies only to samples taken after fasting for at least 8 hours.  HIV Antibody (routine testing w rflx)     Status: None   Collection Time: 06/03/24  5:51 AM  Result Value Ref Range   HIV Screen 4th Generation wRfx Non Reactive Non Reactive    Comment: Performed at [facility], [address] [place], [place] [address]  Basic metabolic panel with GFR     Status: Abnormal   Collection Time: 06/03/24  5:51 AM  Result Value Ref Range   Sodium 141 135 - 145 mmol/L   Potassium 3.8 3.5 - 5.1 mmol/L   Chloride 103 98 - 111 mmol/L   CO2 26 22 - 32 mmol/L   Glucose, Bld 168 (H) 70 - 99 mg/dL    Comment: Glucose reference range applies only to samples taken after fasting for at least 8 hours.   BUN 20 8 - 23 mg/dL   Creatinine, Ser 8.56 (H) 0.61 - 1.24 mg/dL   Calcium  8.7 (L) 8.9 - 10.3 mg/dL   GFR, Estimated 54 (L) >60 mL/min    Comment: (NOTE) Calculated using the CKD-EPI Creatinine Equation (2021)    Anion gap 12 5 - 15    Comment: Performed at [facility], [address] [place], [place] [address]  CBC     Status: Abnormal   Collection Time: 06/03/24  5:51 AM  Result Value Ref Range   WBC 10.4 4.0 - 10.5 K/uL   RBC 4.79 4.22 - 5.81 MIL/uL   Hemoglobin 12.7 (L) 13.0 - 17.0 g/dL   HCT 59.9 60.9 - 47.9 %   MCV 83.5 80.0 - 100.0 fL   MCH 26.5 26.0 - 34.0 pg   MCHC 31.8 30.0 - 36.0 g/dL   RDW 84.2 (H) 88.4 - 84.4 %    Platelets 322 150 - 400 K/uL   nRBC 0.0 0.0 - 0.2 %    Comment: Performed at [facility], [address] [place], [place] [address]  Blood gas, venous     Status: Abnormal   Collection Time: 06/03/24  5:51 AM  Result Value Ref Range   pH, Ven 7.37 7.25 - 7.43   pCO2, Ven 55 44 - 60 mmHg   pO2, Ven 39 32 - 45 mmHg   Bicarbonate 32.4 (H) 20.0 - 28.0 mmol/L   Acid-Base Excess 5.5 (H) 0.0 - 2.0 mmol/L   O2 Saturation 66.8 %   Patient temperature 36.7     Comment: Performed at [facility], [address], [place], [place] [address]  Glucose, capillary     Status: Abnormal   Collection Time: 06/03/24  8:14 AM  Result Value Ref Range   Glucose-Capillary 172 (H) 70 - 99 mg/dL    Comment: Glucose reference range applies only to samples taken after fasting for at least 8 hours.  Sodium     Status: None   Collection Time: 06/03/24 11:11 AM  Result Value Ref Range   Sodium 145 135 - 145 mmol/L    Comment: Performed at [facility], [address] [place],  [place] [address]  Glucose, capillary     Status: Abnormal   Collection Time: 06/03/24 11:34 AM  Result Value Ref Range   Glucose-Capillary 177 (H) 70 - 99 mg/dL    Comment: Glucose reference range applies only to samples taken after fasting for at least 8 hours.    MR BRAIN W WO CONTRAST Result Date: 06/03/2024 CLINICAL DATA:  68 year old male code stroke presentation yesterday with large left frontal lobe intra-axial hemorrhage. EXAM: MRI HEAD WITHOUT AND WITH CONTRAST TECHNIQUE: Multiplanar, multiecho pulse sequences of the brain and surrounding structures were obtained without and with intravenous contrast. CONTRAST:  10mL GADAVIST  GADOBUTROL  1 MMOL/ML IV SOLN COMPARISON:  CT head, CTA head and neck yesterday. FINDINGS: Study is intermittently degraded by motion artifact despite repeated imaging attempts. Brain: Large in heterogeneous intra-axial hemorrhage with layering hematocrit level in the anterior left frontal  lobe, blood products encompassing roughly 65 by 55 by 46 mm on MRI now (AP by transverse by CC) for an estimated blood volume 82 mL. This compares to approximately 64 mL on the presentation CT when measured with the same technique. Surrounding T2 and FLAIR hyperintense edema. Following contrast there is rounded, masslike enhancement along the anterior superior margin of the hemorrhage in an area of 25 x 22 x 18 mm (series 19, image 20 and series 20, image 24. That lesion also appears different from the remaining hematoma on DWI. No 2nd enhancing brain lesion is identified. No dural thickening. Intraventricular blood volume is moderate and increased since the presentation CT. Ventricle size and configuration not significantly changed. No significant subarachnoid or other extra-axial blood component is identified. Basilar cistern patency appears stable from the CTA. Rightward midline shift at the anterior septum pellucidum is 12 mm and mildly increased since presentation. Blood products related susceptibility on DWI. No larger area of diffusion restriction. No restricted diffusion elsewhere. On SWI no chronic cerebral blood products identified. Scattered white matter T2 and FLAIR hyperintensity on the late it to the acute edema surrounding the left frontal lobe hematoma. Mild T2 heterogeneity also in the central pons. No cortical encephalomalacia identified. Pituitary and cervicomedullary junction appear negative. Cerebellum appears negative. Vascular: Major intracranial vascular flow voids are preserved. Skull and upper cervical spine: Negative visible cervical spine. Visualized bone marrow signal is within normal limits. Sinuses/Orbits: Negative orbits. Stable paranasal sinus disease as seen by CT. Other: Mild mastoid effusion also stable. Negative visible scalp and face. IMPRESSION: 1. 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 2.5 cm enhancing tumor with larger surrounding acute hemorrhage (hematoma  volume now estimated at 82 mL and increased from approximately 64 mL at presentation). Moderate volume of intraventricular blood has progressed since presentation, but with stable ventricle size and configuration. Intracranial mass effect with rightward midline shift of 12 mL has mildly increased. Basilar cistern patency is stable. 2. Intermittently motion degraded exam, with no 2nd enhancing brain lesion is identified. 3. No other acute intracranial abnormality identified. Underlying moderate for age nonspecific cerebral white matter signal changes. Electronically Signed   By: VEAR Hurst M.D.   On: 06/03/2024 04:03   DG CHEST PORT 1 VIEW Result Date: 06/02/2024 CLINICAL DATA:  CHF.  Altered mental status.  Slurred speech. EXAM: PORTABLE CHEST 1 VIEW COMPARISON:  11/16/2023 FINDINGS: Mild cardiomegaly with vascular congestion. Interstitial prominence could reflect interstitial edema. Stable mild elevation of the right hemidiaphragm with right base atelectasis. No effusions. IMPRESSION: Cardiomegaly with vascular congestion. Suspect mild interstitial edema. Right base atelectasis. Electronically Signed  By: Franky Crease M.D.   On: 06/02/2024 22:22   CT ANGIO HEAD NECK W WO CM (CODE STROKE) Result Date: 06/02/2024 EXAM: CTA HEAD AND NECK WITHOUT AND WITH 06/02/2024 06:50:16 PM TECHNIQUE: CTA of the head and neck was performed without and with the administration of 75 mL of iohexol  (OMNIPAQUE ) 350 MG/ML injection. Multiplanar 2D and/or 3D reformatted images are provided for review. Automated exposure control, iterative reconstruction, and/or weight based adjustment of the mA/kV was utilized to reduce the radiation dose to as low as reasonably achievable. Stenosis of the internal carotid arteries measured using NASCET criteria. COMPARISON: None available CLINICAL HISTORY: Neuro deficit, acute, stroke suspected. 75mL Omni 350 IV. Code stroke. Aphasia; slurred speech. LKW 0900; Michaela [phone]. FINDINGS: CTA  NECK: AORTIC ARCH AND ARCH VESSELS: Atherosclerotic calcifications are present in the distal aortic arch. Grade vessel origins are within normal limits. No dissection or arterial injury. No significant stenosis of the brachiocephalic or subclavian arteries. CERVICAL CAROTID ARTERIES: Mild calcifications are present in the proximal ICA bilaterally. No significant stenoses are present. Moderate tortuosity is present in the left cervical ICA. No dissection or arterial injury. CERVICAL VERTEBRAL ARTERIES: No dissection, arterial injury, or significant stenosis. LUNGS AND MEDIASTINUM: Unremarkable. SOFT TISSUES: No acute abnormality. BONES: Multilevel degenerative changes are present in the cervical spine. CTA HEAD: ANTERIOR CIRCULATION: Atherosclerotic calcifications are present in the cavernous internal carotid arteries bilaterally. No significant stenosis of the internal carotid arteries. No significant stenosis of the anterior cerebral arteries. No significant stenosis of the middle cerebral arteries. No aneurysm. POSTERIOR CIRCULATION: No significant stenosis of the posterior cerebral arteries. No significant stenosis of the basilar artery. No significant stenosis of the vertebral arteries. No aneurysm. OTHER: No dural venous sinus thrombosis on this non-dedicated study. No vascular malformation or spot sign is associated with the hemorrhage. There does appear to be some vascularity within the area suspected to be a mass. IMPRESSION: 1. No large vessel occlusion, hemodynamically significant stenosis, or aneurysm in the head or neck. 2. Atherosclerotic calcifications in the distal aortic arch, cavernous internal carotid arteries bilaterally, and proximal ICA bilaterally without significant stenosis. Moderate tortuosity of the left cervical ICA. Electronically signed by: Lonni Necessary MD 06/02/2024 07:18 PM EDT RP Workstation: [id number]   CT HEAD CODE STROKE WO CONTRAST` Result Date: 06/02/2024 EXAM: CT HEAD  WITHOUT CONTRAST 06/02/2024 06:36:34 PM TECHNIQUE: CT of the head was performed without the administration of intravenous contrast. Automated exposure control, iterative reconstruction, and/or weight based adjustment of the mA/kV was utilized to reduce the radiation dose to as low as reasonably achievable. COMPARISON: None available. CLINICAL HISTORY: stroke suspected. Non con. Code stroke. Aphasia; slurred speech. LKW 0900; Michaela [phone]. FINDINGS: BRAIN AND VENTRICLES: Hemorrhage in the anterior left frontal lobe measures 4.3 x 6.2 x 4.2 cm. Estimated volume of the hemorrhage is 56 ml. Layering blood products are present. Within the anterior superior aspect of the hemorrhage is a more heterogeneous region suggesting an underlying mass which measures 3.4 x 1.9 cm. Marked surrounding vasogenic edema is present. Mass effect is present with 9 mm of midline shift. No other focal hemorrhage or mass lesion is present. No evidence of acute infarct. No hydrocephalus. No extra-axial collection. ORBITS: No acute abnormality. SINUSES: Chronic opacification of the right maxillary sinus present. A small fluid level is present in the left maxillary sinus. Mucosal thickening is present in the anterior ethmoid air cells and inferior frontal sinuses bilaterally. SOFT TISSUES AND SKULL: No acute soft tissue abnormality. No skull fracture.  IMPRESSION: 1. Hemorrhage in the anterior left frontal lobe measuring 4.3 x 6.2 x 4.2 cm (estimated volume 56.0 mL) with layering blood products and a heterogeneous region in the anterior superior aspect suggesting an underlying mass measuring 3.4 x 1.9 cm. MRI of the head without and with contrast may be useful for further evaluation. 2. Marked surrounding vasogenic edema and mass effect with 9 mm of midline shift. Critical values were communicated to Dr. Michaela at 6:48 via the [facility] system Electronically signed by: Lonni Necessary MD 06/02/2024 06:48 PM EDT RP Workstation:  [id number]    Review of Systems  Neurological:  Positive for speech difficulty and weakness.   Blood pressure (!) 144/67, pulse (!) 57, temperature 99.6 F (37.6 C), temperature source Axillary, resp. rate (!) 31, height 5' 7 (1.702 m), weight (!) 138.1 kg, SpO2 93%. Physical Exam Neurological:     Comments: Patient is awake and globally aphasic nonverbal.  Pupils are equal and reactive left side is 5 out of 5 strength right side has a mild hemiparesis at about 4 out of 5.  Not able to follow simple commands or answer any questions.     Assessment/Plan: 68 year old gentleman presents with left frontal lobe hemorrhagic mass.  More than likely this represents metastatic melanoma.  I did note patient has been made DNR so these discussions will need to be had with the family regarding whether the patient would want of the family to want to consider surgical resection of this.  She does have other associated medical comorbidities with a history of congestive heart failure.  Recommend steroids for edema observation will discuss with team and if family wants to proceed forward with surgery will schedule early this week.  Justin Rasmussen 06/03/2024, 2:34 PM

## 2024-06-03 NOTE — Progress Notes (Signed)
 PT Cancellation Note  Patient Details Name: Justin Rasmussen MRN: [id number] DOB: December 13, 1955   Cancelled Treatment:    Reason Eval/Treat Not Completed: Active bedrest order  Noted bedrest x 24 hrs beginning 9/27 at 1922. Will plan to see 9/29. If bedrest orders are lifted earlier, please reach out via securechat.    Macario RAMAN, PT Acute Rehabilitation Services  Office [phone]  Macario SHAUNNA Soja 06/03/2024, 7:10 AM

## 2024-06-03 NOTE — Progress Notes (Addendum)
 NAMECharlton Rasmussen, MRN:  [id number], DOB:  Apr 03, 1956, LOS: 1 ADMISSION DATE:  06/02/2024, CONSULTATION DATE:  9/27 REFERRING MD:  Michaela, neuro CHIEF COMPLAINT: ICH   History of Present Illness:  68 year old male with past medical history of pulmonary hypertension, hyperlipidemia, OSA on CPAP at night, HFrEF, COPD, melanoma currently on nivolumab  presented to the ED this evening with right-sided weakness and aphasia. LKW around 9 AM.  Wife in the emergency department reported that around noon he seemed tired and not speaking much.  Also noted to be a little confused.  On arrival to ED, noted to have right-sided weakness, aphasia and code stroke was activated.  CT head with left frontal lobe 4 x 6 x 4 cm hemorrhage with concern for underlying mass, vasogenic edema and 9 mm midline shift.  CT angio with no LVO.  Labs with hemoglobin 12.3, sodium 140, creatinine 1.26, ethanol negative, glucose normal.  He was evaluated by neurology who admitted him to ICU.  CCM with cross coverage consult.  Pertinent  Medical History  pulmonary hypertension, hyperlipidemia, OSA on CPAP at night, HFrEF, COPD, melanoma currently on nivolumab   Significant Hospital Events: Including procedures, antibiotic start and stop dates in addition to other pertinent events   9/27: Admit to ICU s/p code stroke with left frontal lobe ICH and concern for mass  Interim History / Subjective:  Evaluated at bedside.  He has expressive aphasia.  He answers orientation questions appropriately with head nod, however has some difficulty with following commands.  No acute distress.  Daughters at bedside.  Objective   Blood pressure (!) 137/58, pulse (!) 103, temperature 98.3 F (36.8 C), temperature source Oral, resp. rate (!) 36, height 5' 7 (1.702 m), weight (!) 138.1 kg, SpO2 92%.        Intake/Output Summary (Last 24 hours) at 06/03/2024 9278 Last data filed at 06/03/2024 0600 Gross per 24 hour  Intake 242.08 ml  Output  250 ml  Net -7.92 ml   Filed Weights   06/02/24 2015  Weight: (!) 138.1 kg    Examination: General: Elderly male, not in acute distress, obese  Lungs: Clear to auscultation bilaterally Cardiovascular: Giller rate and rhythm, normal S1, normal S2 Abdomen: Obese, soft, nontender Extremities: No edema, warm Neuro: Awake, 4 out of 5 strength and right upper and lower extremity. GU: No Foley  Resolved Hospital Problem list    Assessment & Plan:  Acute left frontal lobe ICH Vasogenic edema and 12 mm midline shift Single 2.5 cm enhancing tumor with surrounding hemorrhage.  No evidence of other metastasis Last known well 9 AM 06/02/2024.  Right-sided weakness, right facial droop, Broca's aphasia.  History of melanoma on treatment currently and imaging concerning for hemorrhagic mass. - Stroke primary, appreciate management - No antiplatelets or anticoagulants - SBP goal < 150, prn cardene gtt ordered  - frequent neuro checks  - stat imaging for change in mental status - f/u MRI brain showing 2.5 cm enhancing tumor with surrounding hemorrhage with 12 mm midline shift and 83 cc total of blood increased from prior imaging. - PT/OT/SLP  HFrEF Hypertension Hyperlipidemia  On torsemide  20 daily, entresto  bid, crestor  10mg  daily  - goal SBP < 150  - coreg  6.25mg  BID  - prn cardene gtt for goals  - f/u echocardiogram  - check lipid panel, restart statin when clinically appropriate   COPD  OSA on CPAP at night  Pulmonary hypertension, previously on home O2, now prn  On albuterol  prn -  CPAP ordered nightly  - O2 prn for sat >90%  - prn duoneb   Diabetes? On Farxiga  at home  - check A1c  - ssi  - cbg q4h   GOC: discussed by primary with wife and made DNR-Limited   Labs   CBC: Recent Labs  Lab 06/02/24 1815 06/02/24 1818 06/03/24 0551  WBC 8.7  --  10.4  NEUTROABS 7.0  --   --   HGB 12.3* 12.9* 12.7*  HCT 39.9 38.0* 40.0  MCV 83.5  --  83.5  PLT 342  --  322     Basic Metabolic Panel: Recent Labs  Lab 06/02/24 1815 06/02/24 1818 06/03/24 0551  NA 140 141 141  K 3.8 3.8 3.8  CL 99 100 103  CO2 27  --  26  GLUCOSE 127* 130* 168*  BUN 16 17 20   CREATININE 1.26* 1.30* 1.43*  CALCIUM  8.9  --  8.7*   GFR: Estimated Creatinine Clearance: 68.2 mL/min (A) (by C-G formula based on SCr of 1.43 mg/dL (H)). Recent Labs  Lab 06/02/24 1815 06/03/24 0551  WBC 8.7 10.4    Liver Function Tests: Recent Labs  Lab 06/02/24 1815  AST 21  ALT 13  ALKPHOS 46  BILITOT 0.7  PROT 7.1  ALBUMIN 3.5   No results for input(s): LIPASE, AMYLASE in the last 168 hours. No results for input(s): AMMONIA in the last 168 hours.  ABG    Component Value Date/Time   PHART 7.322 (L) 06/05/2019 0753   PCO2ART 73.6 (HH) 06/05/2019 0753   PO2ART 98.0 06/05/2019 0753   HCO3 32.4 (H) 06/03/2024 0551   TCO2 29 06/02/2024 1818   O2SAT 66.8 06/03/2024 0551     Coagulation Profile: Recent Labs  Lab 06/02/24 1815  INR 1.2    Cardiac Enzymes: No results for input(s): CKTOTAL, CKMB, CKMBINDEX, TROPONINI in the last 168 hours.  HbA1C: Hgb A1c MFr Bld  Date/Time Value Ref Range Status  06/02/2024 10:35 PM 5.5 4.8 - 5.6 % Final    Comment:    (NOTE) Diagnosis of Diabetes The following HbA1c ranges recommended by the American Diabetes Association (ADA) may be used as an aid in the diagnosis of diabetes mellitus.  Hemoglobin             Suggested A1C NGSP%              Diagnosis  <5.7                   Non Diabetic  5.7-6.4                Pre-Diabetic  >6.4                   Diabetic  <7.0                   Glycemic control for                       adults with diabetes.    11/10/2021 10:22 AM 5.9 (H) 4.8 - 5.6 % Final    Comment:             Prediabetes: 5.7 - 6.4          Diabetes: >6.4          Glycemic control for adults with diabetes: <7.0     CBG: Recent Labs  Lab 06/02/24 1853 06/02/24 2306 06/03/24 0422   GLUCAP 124* 141*  165*    Review of Systems:   As above  Past Medical History:  He,  has a past medical history of 2019 novel coronavirus disease (COVID-19) (07/2019), COPD (chronic obstructive pulmonary disease) (HCC), HFrEF (heart failure with reduced ejection fraction) (HCC), Melanoma (HCC), Nonischemic cardiomyopathy (HCC), OSA (obstructive sleep apnea), and Pulmonary hypertension (HCC).   Surgical History:   Past Surgical History:  Procedure Laterality Date   ADENOIDECTOMY     RIGHT/LEFT HEART CATH AND CORONARY ANGIOGRAPHY N/A 06/05/2019   Procedure: RIGHT/LEFT HEART CATH AND CORONARY ANGIOGRAPHY;  Surgeon: Dann Candyce RAMAN, MD;  Location: [facility] INVASIVE CV LAB;  Service: Cardiovascular;  Laterality: N/A;     Social History:   reports that he quit smoking about 35 years ago. His smoking use included cigarettes. He started smoking about 50 years ago. He has a 15 pack-year smoking history. He has never used smokeless tobacco. He reports that he does not currently use alcohol. He reports that he does not use drugs.   Family History:  His family history includes Diabetes in his father and sister; Heart attack in his father; Heart disease in his sister; Kidney disease in his mother.   Allergies No Known Allergies   Home Medications  Prior to Admission medications   Medication Sig Start Date End Date Taking? Authorizing Provider  acetaminophen  (TYLENOL ) 500 MG tablet Take 1,000 mg by mouth.   Yes [provider]  albuterol  (VENTOLIN  HFA) 108 (90 Base) MCG/ACT inhaler Inhale 2 puffs into the lungs every 6 (six) hours as needed for wheezing or shortness of breath. 02/10/24  Yes Pauline Garnette LABOR, MD  aspirin  81 MG chewable tablet Chew 1 tablet (81 mg total) by mouth daily. 06/08/19  Yes Rosario Leatrice FERNS, MD  carvedilol  (COREG ) 6.25 MG tablet Take 1 tablet (6.25 mg total) by mouth 2 (two) times daily with a meal. Patient must schedule annual appointment for further refills  03/28/24  Yes Croitoru, Mihai, MD  cetirizine (ZYRTEC) 10 MG tablet Take 10 mg by mouth daily.   Yes [provider]  FARXIGA  10 MG TABS tablet Take 1 tablet (10 mg total) by mouth daily before breakfast. Patient must schedule annual appointment for further refills 03/28/24  Yes Croitoru, Mihai, MD  Multiple Vitamin (MULTIVITAMIN WITH MINERALS) TABS tablet Take 1 tablet by mouth daily.   Yes [provider]  rosuvastatin  (CRESTOR ) 10 MG tablet Take 1 tablet (10 mg total) by mouth daily. Patient must schedule annual appointment for further refills 03/28/24  Yes Croitoru, Mihai, MD  sacubitril -valsartan  (ENTRESTO ) 97-103 MG Take 1 tablet by mouth twice daily 09/21/23  Yes Croitoru, Mihai, MD  torsemide  (DEMADEX ) 20 MG tablet Take 1 tablet (20 mg total) by mouth as needed (Swelling). Patient taking differently: Take 20 mg by mouth daily. 11/10/23  Yes Croitoru, Mihai, MD  amoxicillin -clavulanate (AUGMENTIN ) 875-125 MG tablet Take one tab PO Q12hr with food Patient not taking: Reported on 06/02/2024 02/10/24   Pauline Garnette LABOR, MD  benzonatate  (TESSALON ) 100 MG capsule Take 1 capsule (100 mg total) by mouth 3 (three) times daily as needed. Patient not taking: Reported on 06/02/2024 01/20/24   Kennyth Domino, FNP  fluticasone  (FLONASE ) 50 MCG/ACT nasal spray Place 2 sprays into both nostrils daily. Patient not taking: Reported on 07/12/2023 11/19/19   Gretta Doffing P, DO  montelukast  (SINGULAIR ) 10 MG tablet Take one tab PO QHS Patient not taking: Reported on 06/02/2024 02/10/24   Pauline Garnette LABOR, MD    The patient is critically  ill due to acute intracranial bleeding requiring frequent neurochecks.  Critical care was necessary to treat or prevent imminent or life-threatening deterioration. Critical care time was spent by me on the following activities: development of a treatment plan with the patient and/or surrogate as well as nursing, discussions with consultants, evaluation of the patient's response  to treatment, examination of the patient, obtaining a history from the patient or surrogate, ordering and performing treatments and interventions, ordering and review of laboratory studies, ordering and review of radiographic studies, review of telemetry data including pulse oximetry, re-evaluation of patient's condition and participation in multidisciplinary rounds.   I personally spent 31 minutes providing critical care not including any separately billable procedures.   Zola LOISE Herter, MD [facility] 06/15/2024 1:00 PM

## 2024-06-03 NOTE — Evaluation (Signed)
 Speech Language Pathology Evaluation Patient Details Name: Justin Rasmussen MRN: [id number] DOB: 1956/02/11 Today's Date: 06/03/2024 Time: 9056-9047 SLP Time Calculation (min) (ACUTE ONLY): 9 min  Problem List:  Patient Active Problem List   Diagnosis Date Noted   ICH (intracerebral hemorrhage) (HCC) 06/02/2024   Wheezing on auscultation 11/16/2023   Acute midline low back pain without sciatica 11/16/2023   OSA (obstructive sleep apnea) 07/04/2019   Cor pulmonale (chronic) (HCC)    Cardiomyopathy- etiology not yet determined 06/03/2019   Morbid obesity (HCC) 06/03/2019   Elevated hemidiaphragm-Rt 06/03/2019   RBBB 06/03/2019   Acute systolic congestive heart failure (HCC) 06/01/2019   Past Medical History:  Past Medical History:  Diagnosis Date   2019 novel coronavirus disease (COVID-19) 07/2019   COPD (chronic obstructive pulmonary disease) (HCC)    HFrEF (heart failure with reduced ejection fraction) (HCC)    Melanoma (HCC)    Nonischemic cardiomyopathy (HCC)    OSA (obstructive sleep apnea)    Pulmonary hypertension (HCC)    WHO group II   Past Surgical History:  Past Surgical History:  Procedure Laterality Date   ADENOIDECTOMY     RIGHT/LEFT HEART CATH AND CORONARY ANGIOGRAPHY N/A 06/05/2019   Procedure: RIGHT/LEFT HEART CATH AND CORONARY ANGIOGRAPHY;  Surgeon: Dann Candyce RAMAN, MD;  Location: [facility] INVASIVE CV LAB;  Service: Cardiovascular;  Laterality: N/A;   HPI:  Pt is a 68 yo male presenting to ED 9/27 with R sided weakness and aphasia. CTH shows large L frontal IPH. MRI shows 2.5 cm enhancing tumor with surrounding hemorrhage and 12 mm midline shift (hematoma volume now estimated at 82 mL and increased from approximately 64 mL at presentation). PMH: pulmonary HTN, HLD, OSA on CPAP at night, HFrEF, COPD, melanoma currently on nivolumab    Assessment / Plan / Recommendation Clinical Impression  Pt presents with expressive and receptive language deficits affecting  auditory comprehension, participation in functional tasks, and verbal output. He exhibits impairments related to initiation and verbal output was not observed throughout the entirety of the session. He was ~50% accurate with following commands. Yes/no questions were difficult regardless of complexity, though he was more slightly more accurate with biographical and environmental questions. He demonstrates difficulty problem solving to compensate for acute RUE weakness during self feeding, requiring up to Mod cueing for carryover between tasks. Recommend ongoing SLP f/u to target acute cognitive-linguistic deficits.    SLP Assessment  SLP Recommendation/Assessment: Patient needs continued Speech Language Pathology Services SLP Visit Diagnosis: Aphasia (R47.01);Cognitive communication deficit (R41.841)     Assistance Recommended at Discharge  Frequent or constant Supervision/Assistance  Functional Status Assessment Patient has had a recent decline in their functional status and demonstrates the ability to make significant improvements in function in a reasonable and predictable amount of time.  Frequency and Duration min 2x/week  2 weeks      SLP Evaluation Cognition  Overall Cognitive Status: Within Functional Limits for tasks assessed Arousal/Alertness: Awake/alert Orientation Level: Oriented to person Attention: Sustained Sustained Attention: Appears intact Awareness: Impaired Awareness Impairment: Intellectual impairment Problem Solving: Impaired Problem Solving Impairment: Functional basic       Comprehension  Auditory Comprehension Overall Auditory Comprehension: Impaired Yes/No Questions: Impaired Basic Biographical Questions: 51-75% accurate Basic Immediate Environment Questions: 50-74% accurate Complex Questions: 25-49% accurate Commands: Impaired One Step Basic Commands: 25-49% accurate    Expression Expression Primary Mode of Expression: Verbal Verbal  Expression Overall Verbal Expression: Impaired Initiation: Impaired   Oral / Motor  Oral Motor/Sensory Function Overall Oral Motor/Sensory  Function: Mild impairment Facial ROM: Reduced right;Suspected CN VII (facial) dysfunction Facial Symmetry: Abnormal symmetry right;Suspected CN VII (facial) dysfunction Facial Strength: Reduced right;Suspected CN VII (facial) dysfunction Lingual ROM: Within Functional Limits Lingual Symmetry: Within Functional Limits Lingual Strength: Within Functional Limits Motor Speech Overall Motor Speech: Other (comment) (DTA - no verbal output)            Damien Blumenthal, M.A., CCC-SLP Speech Language Pathology, Acute Rehabilitation Services  Secure Chat preferred [phone]  06/03/2024, 10:59 AM

## 2024-06-03 NOTE — Progress Notes (Signed)
 OT Cancellation Note  Patient Details Name: Justin Rasmussen MRN: [id number] DOB: 07/01/1956   Cancelled Treatment:    Reason Eval/Treat Not Completed: Active bedrest order. BR 24 hrs beginning 9/27 1922. Will follow up as pt medically ready and OT schedule allows.   Justin Rasmussen Justin Rasmussen, OTR/L [facility] Acute Rehabilitation Office: [phone]   Justin Rasmussen 06/03/2024, 7:18 AM

## 2024-06-04 ENCOUNTER — Other Ambulatory Visit: Payer: Self-pay | Admitting: Radiation Therapy

## 2024-06-04 ENCOUNTER — Inpatient Hospital Stay (HOSPITAL_COMMUNITY)

## 2024-06-04 DIAGNOSIS — I611 Nontraumatic intracerebral hemorrhage in hemisphere, cortical: Secondary | ICD-10-CM | POA: Diagnosis not present

## 2024-06-04 DIAGNOSIS — I5021 Acute systolic (congestive) heart failure: Secondary | ICD-10-CM | POA: Diagnosis not present

## 2024-06-04 DIAGNOSIS — J9601 Acute respiratory failure with hypoxia: Secondary | ICD-10-CM

## 2024-06-04 DIAGNOSIS — I629 Nontraumatic intracranial hemorrhage, unspecified: Secondary | ICD-10-CM

## 2024-06-04 DIAGNOSIS — D332 Benign neoplasm of brain, unspecified: Secondary | ICD-10-CM

## 2024-06-04 DIAGNOSIS — E870 Hyperosmolality and hypernatremia: Secondary | ICD-10-CM

## 2024-06-04 DIAGNOSIS — C7931 Secondary malignant neoplasm of brain: Secondary | ICD-10-CM

## 2024-06-04 DIAGNOSIS — I10 Essential (primary) hypertension: Secondary | ICD-10-CM

## 2024-06-04 DIAGNOSIS — I609 Nontraumatic subarachnoid hemorrhage, unspecified: Secondary | ICD-10-CM | POA: Diagnosis not present

## 2024-06-04 DIAGNOSIS — I615 Nontraumatic intracerebral hemorrhage, intraventricular: Secondary | ICD-10-CM | POA: Diagnosis not present

## 2024-06-04 DIAGNOSIS — R29709 NIHSS score 9: Secondary | ICD-10-CM | POA: Diagnosis not present

## 2024-06-04 DIAGNOSIS — J9811 Atelectasis: Secondary | ICD-10-CM

## 2024-06-04 LAB — CBC
HCT: 37.6 % — ABNORMAL LOW (ref 39.0–52.0)
Hemoglobin: 11.8 g/dL — ABNORMAL LOW (ref 13.0–17.0)
MCH: 26.6 pg (ref 26.0–34.0)
MCHC: 31.4 g/dL (ref 30.0–36.0)
MCV: 84.7 fL (ref 80.0–100.0)
Platelets: 289 K/uL (ref 150–400)
RBC: 4.44 MIL/uL (ref 4.22–5.81)
RDW: 15.9 % — ABNORMAL HIGH (ref 11.5–15.5)
WBC: 13.9 K/uL — ABNORMAL HIGH (ref 4.0–10.5)
nRBC: 0 % (ref 0.0–0.2)

## 2024-06-04 LAB — GLUCOSE, CAPILLARY
Glucose-Capillary: 138 mg/dL — ABNORMAL HIGH (ref 70–99)
Glucose-Capillary: 142 mg/dL — ABNORMAL HIGH (ref 70–99)
Glucose-Capillary: 153 mg/dL — ABNORMAL HIGH (ref 70–99)
Glucose-Capillary: 139 mg/dL — ABNORMAL HIGH (ref 70–99)
Glucose-Capillary: 163 mg/dL — ABNORMAL HIGH (ref 70–99)
Glucose-Capillary: 155 mg/dL — ABNORMAL HIGH (ref 70–99)

## 2024-06-04 LAB — SODIUM
Sodium: 150 mmol/L — ABNORMAL HIGH (ref 135–145)
Sodium: 151 mmol/L — ABNORMAL HIGH (ref 135–145)

## 2024-06-04 LAB — ECHOCARDIOGRAM COMPLETE
AR max vel: 3.8 cm2
AV Area VTI: 3.99 cm2
AV Area mean vel: 4.32 cm2
AV Mean grad: 3 mmHg
AV Peak grad: 10 mmHg
Ao pk vel: 1.58 m/s
Area-P 1/2: 3 cm2
Calc EF: 61.8 %
Height: 67 in
MV VTI: 3.01 cm2
S' Lateral: 4.2 cm
Single Plane A2C EF: 62.3 %
Single Plane A4C EF: 59.7 %
Weight: 4871.28 [oz_av]

## 2024-06-04 LAB — BASIC METABOLIC PANEL WITH GFR
Anion gap: 11 (ref 5–15)
BUN: 28 mg/dL — ABNORMAL HIGH (ref 8–23)
CO2: 28 mmol/L (ref 22–32)
Calcium: 8.6 mg/dL — ABNORMAL LOW (ref 8.9–10.3)
Chloride: 111 mmol/L (ref 98–111)
Creatinine, Ser: 1.28 mg/dL — ABNORMAL HIGH (ref 0.61–1.24)
GFR, Estimated: >60 mL/min (ref >60)
Glucose, Bld: 151 mg/dL — ABNORMAL HIGH (ref 70–99)
Potassium: 3.9 mmol/L (ref 3.5–5.1)
Sodium: 150 mmol/L — ABNORMAL HIGH (ref 135–145)

## 2024-06-04 MED ORDER — MELATONIN 3 MG PO TABS
3.0000 mg | ORAL_TABLET | Freq: Once | ORAL | Status: AC | PRN
  Administered 2024-06-04: 3 mg via ORAL
  Filled 2024-06-04: qty 1

## 2024-06-04 MED ORDER — MUPIROCIN 2 % EX OINT
TOPICAL_OINTMENT | Freq: Two times a day (BID) | CUTANEOUS | Status: DC
  Filled 2024-06-04: qty 22

## 2024-06-04 NOTE — Progress Notes (Signed)
 OT Cancellation Note  Patient Details Name: Justin Rasmussen MRN: [id number] DOB: 1956/04/04   Cancelled Treatment:    Reason Eval/Treat Not Completed: Patient not medically ready (Crani planned for tomorrow; OT evaluation to f/u post-op when appropriate.)  Justin Rasmussen 06/04/2024, 1:17 PM

## 2024-06-04 NOTE — Progress Notes (Signed)
 NEUROSURGERY PROGRESS NOTE   Pt seen and examined. No issues overnight. History reviewed in EMR and with Dr. Onetha and with patient/wife.  Briefly, the patient has a history of metastatic melanoma treated at [facility] diagnosed last year with a cutaneous lesion on his right calf.  He underwent surgical resection and lymph node biopsy which was positive for melanoma.  He has been on monthly immunotherapy.  About 2 days ago his wife noted he was much less verbal, and had right sided weakness.  He was brought to the emergency department where CT scan and subsequent MRI revealed a left frontal tumor with significant associated hemorrhage and mass effect on the left hemisphere.  Of note, patient does have a history of congestive heart failure, treated by the heart failure team.  His wife reports latest EF of 45%.  Patient also has a history of sleep apnea and does use CPAP.  He does not have any history of hypertension or diabetes.  He does have some dyslipidemia.  No known liver or kidney disease.  He does take a baby aspirin .  EXAM: Temp:  [98.2 F (36.8 C)-99.6 F (37.6 C)] 99 F (37.2 C) (09/29 0800) Pulse Rate:  [48-80] 64 (09/29 0900) Resp:  [19-31] 23 (09/29 0900) BP: (114-147)/(47-94) 131/66 (09/29 1000) SpO2:  [85 %-96 %] 94 % (09/29 0900) Intake/Output      09/28 0701 09/29 0700 09/29 0701 09/30 0700   I.V. (mL/kg) 1159 (8.4)    IV Piggyback 10    Total Intake(mL/kg) 1169 (8.5)    Urine (mL/kg/hr) 525 (0.2)    Emesis/NG output     Total Output 525    Net +644          Awake, alert Is able to say a few single words Face symmetric Good strength LUE/LLE 3-4/5 RUE/RLE   LABS: Lab Results  Component Value Date   CREATININE 1.28 (H) 06/04/2024   BUN 28 (H) 06/04/2024   NA 150 (H) 06/04/2024   K 3.9 06/04/2024   CL 111 06/04/2024   CO2 28 06/04/2024   Lab Results  Component Value Date   WBC 13.9 (H) 06/04/2024   HGB 11.8 (L) 06/04/2024   HCT 37.6 (L)  06/04/2024   MCV 84.7 06/04/2024   PLT 289 06/04/2024    IMAGING: MRI of the brain with and without contrast was personally reviewed and demonstrates a heterogeneous enhancing mass overlying the left frontal convexity.  There is a large associated hematoma with hematocrit level.  There is associated mass effect on the left frontal horn and left to right midline shift.  No other enhancing lesions within the brain are noted.  IMPRESSION: - 68 y.o. male with history of metastatic melanoma presenting with global aphasia related to hemorrhagic left frontal metastasis.  Patient's case has been discussed at the multidisciplinary neuro-oncology conference where consensus opinion was to proceed with surgical resection and evacuation of hematoma with postoperative adjuvant stereotactic radiosurgery.  PLAN: - We will plan on proceeding with stereotactic craniotomy for resection of tumor and evacuation of hematoma, tentatively scheduled tomorrow. - N.p.o. after midnight - Remainder of supportive care per PCCM/neurology  I have reviewed the situation at length with the patient and his wife at bedside.  We have discussed the imaging findings thus far.  We have discussed my recommendation for surgery both for relief of mass effect as well as for oncologic reasons.  We discussed the details of the operation and the expected postoperative course and recovery.  We have also reviewed the risks of the procedure to include risk of worsening speech or right sided weakness, risk of stroke, bleeding requiring future surgery, and risk of seizures.  We did also discuss general risks of anesthesia including heart attack, stroke, blood clots.  We discussed the albeit highly unlikely need for prolonged intubation.  All questions were answered.  Both the patient and his wife indicated willingness to proceed with the plan above.   Gerldine Maizes, MD [facility]

## 2024-06-04 NOTE — Progress Notes (Addendum)
 STROKE TEAM PROGRESS NOTE   SIGNIFICANT HOSPITAL EVENTS 9/27: Patient presented with right-sided weakness and aphasia, found to have left frontal ICH. 9/28: MRI reveals hemorrhagic tumor in the anterior left frontal lobe, neurosurgery consulted  INTERIM HISTORY/SUBJECTIVE Patient is awake and alert with his wife at bedside.  Still unable to produce speech or follow commands with global aphasia but wife says patient said happy birthday to his daughter on FaceTime yesterday.  Stereotactic craniotomy scheduled for tomorrow, no other concerns at this time.  OBJECTIVE  CBC    Component Value Date/Time   WBC 13.9 (H) 06/04/2024 0606   RBC 4.44 06/04/2024 0606   HGB 11.8 (L) 06/04/2024 0606   HCT 37.6 (L) 06/04/2024 0606   PLT 289 06/04/2024 0606   MCV 84.7 06/04/2024 0606   MCH 26.6 06/04/2024 0606   MCHC 31.4 06/04/2024 0606   RDW 15.9 (H) 06/04/2024 0606   LYMPHSABS 0.9 06/02/2024 1815   MONOABS 0.6 06/02/2024 1815   EOSABS 0.1 06/02/2024 1815   BASOSABS 0.0 06/02/2024 1815    BMET    Component Value Date/Time   NA 150 (H) 06/04/2024 0606   NA 141 11/10/2021 1022   K 3.9 06/04/2024 0606   CL 111 06/04/2024 0606   CO2 28 06/04/2024 0606   GLUCOSE 151 (H) 06/04/2024 0606   BUN 28 (H) 06/04/2024 0606   BUN 18 11/10/2021 1022   CREATININE 1.28 (H) 06/04/2024 0606   CALCIUM  8.6 (L) 06/04/2024 0606   EGFR 76 11/10/2021 1022   GFRNONAA >60 06/04/2024 0606    IMAGING past 24 hours CT HEAD WO CONTRAST ( ) Result Date: 06/03/2024 EXAM: CT HEAD WITHOUT CONTRAST 06/03/2024 03:12:55 PM TECHNIQUE: CT of the head was performed without the administration of intravenous contrast. Automated exposure control, iterative reconstruction, and/or weight based adjustment of the mA/kV was utilized to reduce the radiation dose to as low as reasonably achievable. COMPARISON: None available. CLINICAL HISTORY: Stroke, hemorrhagic. F/u. FINDINGS: BRAIN AND VENTRICLES: Left frontal lobe hemorrhagic  mass is again noted. The hemorrhage is stable from the MRI measuring up to 6 cm in maximal dimension. Vasogenic edema is again noted. Midline shift measures up to 11 mm. Subarachnoid blood is now present in the posterior right frontal lobe. Interventricular hemorrhage is present in the posterior horn of the left lateral ventricle. No evidence of acute infarct. No hydrocephalus is present. A 7 mm midline lipoma is present along the intracerebral falx between the frontal lobes. No extra-axial collection. ORBITS: No acute abnormality. SINUSES: No acute abnormality. SOFT TISSUES AND SKULL: No acute soft tissue abnormality. No skull fracture. IMPRESSION: 1. Stable left frontal lobe hemorrhagic mass measuring up to 6 cm with associated vasogenic edema and midline shift of up to 11 mm. 2. Subarachnoid blood in the posterior right frontal lobe is new since the prior studies 3. Intraventricular hemorrhage in the posterior horn of the left lateral ventricle. No hydrocephalus. Electronically signed by: Lonni Necessary MD 06/03/2024 03:30 PM EDT RP Workstation: [id number]    Vitals:   06/04/24 0700 06/04/24 0800 06/04/24 0900 06/04/24 1000  BP: 125/71 136/74 (!) 142/73 131/66  Pulse: 64 70 64   Resp: (!) 26 (!) 21 (!) 23   Temp:  99 F (37.2 C)    TempSrc:  Axillary    SpO2: 95% 94% 94%   Weight:      Height:        PHYSICAL EXAM General:  Alert, obese patient in no acute distress CV: Regular rate and rhythm  on monitor Respiratory:  Regular, unlabored respirations on room air  NEURO:  Mental Status: Patient is alert, responds to name and unable to follow simple commands Speech/Language: No verbal output  Cranial Nerves:  II: PERRL.  Blinking to visual threat bilaterally III, IV, VI: Eyelids elevate symmetrically, tracks examiner bilaterally VII: Mild R facial droop VIII: hearing intact to voice. IX, X: Palate elevates symmetrically. Phonation is normal.  XII: tongue is midline without  fasciculations. Motor: RUE drift to bed less than 10 seconds Tone: is normal and bulk is normal Sensation-unable to assess Coordination: FTN unable to perform Gait- deferred  Most Recent NIH 9   ASSESSMENT/PLAN  [name] is a 68 y.o. male with history of melanoma on immunotherapy admitted for right-sided weakness and aphasia.  He was found to have a left frontal ICH on CT, and MRI revealed a hemorrhagic tumor.  He has been seen by neurosurgery and started on Decadron  for edema with operative intervention planned for this week.  Discussed CODE STATUS with patient's wife, who is in agreement with intubation for only a limited period of time.  She states patient would not wish to be on life support for a prolonged period.  NIH on Admission 8    ICH:  left frontal ICH, etiology: Hemorrhagic mass, likely from metastatic melanoma Code Stroke CT head hemorrhage in the anterior left frontal lobe with possible underlying mass and marked surrounding vasogenic edema with 9 mm of midline shift CTA head & neck no LVO, hemodynamically significant stenosis or aneurysm MRI hemorrhagic tumor of the anterior left frontal lobe with 5 cm enhancing tumor with larger surrounding acute hemorrhage, moderate volume of the intraventricular blood, 12 mm midline shift Follow-up CT 9/28: Stable left frontal lobe hemorrhagic mass measuring up to 6 cm with associated vasogenic edema and midline shift of up to 11 mm. 2D Echo EF 60-65%, mildly dilated left ventricular internal cavity and mildly reduced right ventricular systolic function. LDL 93 HgbA1c 5.5 VTE prophylaxis - SCDs aspirin  81 mg daily prior to admission, now on no antithrombotic due to ICH Therapy recommendations: Pending Disposition: Pending  Cerebral edema (brain compression) Cerebral edema seen on head CT and MRI with 12 mm of midline shift Decadron  initiated by neurosurgery for edema due to tumor 3% saline for cytotoxic edema at 50 cc/h Goal  sodium 150-155 Na 145->143->150->151   Hemorrhagic tumor, likely metastatic melanoma Patient has history of melanoma of right foot, resected last year with positive lymph nodes, currently on immunotherapy Hemorrhagic tumor seen on MRI On keppra - continue Neurosurgery consulted - stereotactic craniotomy scheduled for tomorrow   Hypertension Home meds: Carvedilol  6.25 mg twice daily Stable Hold off coreg  due to bradycardia BP goal less than 160 Long-term BP goal normotensive   Hyperlipidemia Home meds: Rosuvastatin  10 mg daily LDL 93, goal < 70 Resume statin at discharge and consider increase to 20 mg   Other Stroke Risk Factors Obesity, Body mass index is 47.68 kg/m., BMI >/= 30 associated with increased stroke risk, recommend weight loss, diet and exercise as appropriate  Congestive heart failure OSA   Other Active Problems Pulmonary hypertension AKI, creatinine 1.26-1.43-1.28  Hospital day # 2   ATTENDING NOTE: I reviewed above note and agree with the assessment and plan. Pt was seen and examined.   Wife is at the bedside. Pt is awake, alert, eyes open, still has global aphasia but per wife, pt was able to say happy birthday to her daughter last night, not following  simple commands but able to pantomime. Not able to name or repeat. No gaze palsy, tracking bilaterally, blinking to visual threat bilaterally, PERRL. Mild R facial droop. Tongue midline. RUE drift to bed within 10 sec. LUE no drift. BLEs 3/5 symmetrically. Sensation, coordination not cooperative and gait not tested.   Discussed with wife about potential surgical tumor removal and hematoma evacuation, wife is in agreement. Discussed with Dr. Lanis and he will talk to wife. Na 150, continue 3% saline.   For detailed assessment and plan, please refer to above as I have made changes wherever appropriate.   Ary Cummins, MD PhD Stroke Neurology 06/04/2024 5:01 PM  This patient is critically ill due to left  ICH, small SAH and IVH, left frontal metastatic melanoma, cerebral edema, AKI and at significant risk of neurological worsening, death form brain herniation, hydrocephalus, further hemorrhage from tumor, renal failure and respite failure. This patient's care requires constant monitoring of vital signs, hemodynamics, respiratory and cardiac monitoring, review of multiple databases, neurological assessment, discussion with family, other specialists and medical decision making of high complexity. I spent 40 minutes of neurocritical care time in the care of this patient. I had long discussion with wife at bedside, updated pt current condition, treatment plan and potential prognosis, and answered all the questions.  She expressed understanding and appreciation. I discussed with Dr. Lanis.  To contact Stroke Continuity provider, please refer to [email or web address]. After hours, contact General Neurology

## 2024-06-04 NOTE — Progress Notes (Addendum)
 NAMEKonstantin Rasmussen, MRN:  [id number], DOB:  1956-08-02, LOS: 2 ADMISSION DATE:  06/02/2024, CONSULTATION DATE:  9/27 REFERRING MD:  Michaela, neuro CHIEF COMPLAINT: ICH   History of Present Illness:  68 year old male with past medical history of pulmonary hypertension, hyperlipidemia, OSA on CPAP at night, HFrEF, COPD, melanoma currently on nivolumab  presented to the ED this evening with right-sided weakness and aphasia. LKW around 9 AM.  Wife in the emergency department reported that around noon he seemed tired and not speaking much.  Also noted to be a little confused.  On arrival to ED, noted to have right-sided weakness, aphasia and code stroke was activated.  CT head with left frontal lobe 4 x 6 x 4 cm hemorrhage with concern for underlying mass, vasogenic edema and 9 mm midline shift.  CT angio with no LVO.  Labs with hemoglobin 12.3, sodium 140, creatinine 1.26, ethanol negative, glucose normal.  He was evaluated by neurology who admitted him to ICU.  CCM with cross coverage consult.  Pertinent  Medical History  pulmonary hypertension, hyperlipidemia, OSA on CPAP at night, HFrEF, COPD, melanoma currently on nivolumab   Significant Hospital Events: Including procedures, antibiotic start and stop dates in addition to other pertinent events   9/27: Admit to ICU s/p code stroke with left frontal lobe ICH and concern for mass  Interim History / Subjective:  Evaluated at bedside.  He has expressive aphasia.  He answers orientation questions appropriately with head nod, however has some difficulty with following commands.  No acute distress.  Daughters at bedside.  Objective   Blood pressure (!) 143/72, pulse 65, temperature 99 F (37.2 C), temperature source Axillary, resp. rate (!) 23, height 5' 7 (1.702 m), weight (!) 138.1 kg, SpO2 94%.        Intake/Output Summary (Last 24 hours) at 06/04/2024 1425 Last data filed at 06/04/2024 1300 Gross per 24 hour  Intake 1122.12 ml  Output --   Net 1122.12 ml   Filed Weights   06/02/24 2015  Weight: (!) 138.1 kg    Examination: General: Elderly male, not in acute distress, obese  Lungs: Clear to auscultation bilaterally Cardiovascular: Giller rate and rhythm, normal S1, normal S2 Abdomen: Obese, soft, nontender Extremities: No edema, warm Neuro: Awake, 4 out of 5 strength and right upper and lower extremity.  Aphasic GU: No Foley  Resolved Hospital Problem list    Assessment & Plan:  Acute left frontal lobe ICH Vasogenic edema and 12 mm midline shift Single 2.5 cm enhancing tumor with surrounding hemorrhage.  No evidence of other metastasis Last known well 9 AM 06/02/2024.  Right-sided weakness, right facial droop, Broca's aphasia.  History of melanoma on treatment currently and imaging concerning for hemorrhagic mass. - Stroke primary, appreciate management - No antiplatelets or anticoagulants - SBP goal < 150, prn cardene gtt ordered to use as needed - frequent neuro checks  - stat imaging for change in mental status - f/u MRI brain showing 2.5 cm enhancing tumor with surrounding hemorrhage with 12 mm midline shift and 83 cc total of blood increased from prior imaging. - PT/OT/SLP - Appreciate neurology and neurosurgery input plans for tumor resection and hematoma evacuation tomorrow - Sodium goal is 150-155 patient is on continuous hypertonic saline.  50 cc an hour will continue. - On Decadron   HFrEF Hypertension Hyperlipidemia  On torsemide  20 daily, entresto  bid, crestor  10mg  daily  - goal SBP < 150  - coreg  6.25mg  BID.  Home dose Entresto  is held. -  prn cardene gtt for goals  - Lipid panel reviewed LDL is 93 HDL is 28.  Restart statin when clinically appropriate  - Echo on this admission shows EF of 60 to 65% no wall motion abnormalities and no significant wall or heart disease   COPD  OSA on CPAP at night  Pulmonary hypertension, previously on home O2, now prn  On albuterol  prn - CPAP ordered nightly   - O2 prn for sat >90%  - prn duoneb   Diabetes? On Farxiga  at home  A1c is 5.5. - ssi  - cbg q4h   Spoke with the wife and updated her.  GOC: discussed by primary with wife and made DNR-Limited   Labs   CBC: Recent Labs  Lab 06/02/24 1815 06/02/24 1818 06/03/24 0551 06/04/24 0606  WBC 8.7  --  10.4 13.9*  NEUTROABS 7.0  --   --   --   HGB 12.3* 12.9* 12.7* 11.8*  HCT 39.9 38.0* 40.0 37.6*  MCV 83.5  --  83.5 84.7  PLT 342  --  322 289    Basic Metabolic Panel: Recent Labs  Lab 06/02/24 1815 06/02/24 1818 06/03/24 0551 06/03/24 1111 06/03/24 1642 06/03/24 2150 06/04/24 0606 06/04/24 1037  NA 140 141 141 145 143 144 150* 150*  K 3.8 3.8 3.8  --   --   --  3.9  --   CL 99 100 103  --   --   --  111  --   CO2 27  --  26  --   --   --  28  --   GLUCOSE 127* 130* 168*  --   --   --  151*  --   BUN 16 17 20   --   --   --  28*  --   CREATININE 1.26* 1.30* 1.43*  --   --   --  1.28*  --   CALCIUM  8.9  --  8.7*  --   --   --  8.6*  --    GFR: Estimated Creatinine Clearance: 76.2 mL/min (A) (by C-G formula based on SCr of 1.28 mg/dL (H)). Recent Labs  Lab 06/02/24 1815 06/03/24 0551 06/04/24 0606  WBC 8.7 10.4 13.9*    Liver Function Tests: Recent Labs  Lab 06/02/24 1815  AST 21  ALT 13  ALKPHOS 46  BILITOT 0.7  PROT 7.1  ALBUMIN 3.5   No results for input(s): LIPASE, AMYLASE in the last 168 hours. No results for input(s): AMMONIA in the last 168 hours.  ABG    Component Value Date/Time   PHART 7.322 (L) 06/05/2019 0753   PCO2ART 73.6 (HH) 06/05/2019 0753   PO2ART 98.0 06/05/2019 0753   HCO3 32.4 (H) 06/03/2024 0551   TCO2 29 06/02/2024 1818   O2SAT 66.8 06/03/2024 0551     Coagulation Profile: Recent Labs  Lab 06/02/24 1815  INR 1.2    Cardiac Enzymes: No results for input(s): CKTOTAL, CKMB, CKMBINDEX, TROPONINI in the last 168 hours.  HbA1C: Hgb A1c MFr Bld  Date/Time Value Ref Range Status  06/02/2024 10:35 PM  5.5 4.8 - 5.6 % Final    Comment:    (NOTE) Diagnosis of Diabetes The following HbA1c ranges recommended by the American Diabetes Association (ADA) may be used as an aid in the diagnosis of diabetes mellitus.  Hemoglobin             Suggested A1C NGSP%  Diagnosis  <5.7                   Non Diabetic  5.7-6.4                Pre-Diabetic  >6.4                   Diabetic  <7.0                   Glycemic control for                       adults with diabetes.    11/10/2021 10:22 AM 5.9 (H) 4.8 - 5.6 % Final    Comment:             Prediabetes: 5.7 - 6.4          Diabetes: >6.4          Glycemic control for adults with diabetes: <7.0     CBG: Recent Labs  Lab 06/03/24 1912 06/03/24 2303 06/04/24 0308 06/04/24 0820 06/04/24 1136  GLUCAP 155* 151* 138* 142* 153*    Review of Systems:   As above  Past Medical History:  He,  has a past medical history of 2019 novel coronavirus disease (COVID-19) (07/2019), COPD (chronic obstructive pulmonary disease) (HCC), HFrEF (heart failure with reduced ejection fraction) (HCC), Melanoma (HCC), Nonischemic cardiomyopathy (HCC), OSA (obstructive sleep apnea), and Pulmonary hypertension (HCC).   Surgical History:   Past Surgical History:  Procedure Laterality Date   ADENOIDECTOMY     RIGHT/LEFT HEART CATH AND CORONARY ANGIOGRAPHY N/A 06/05/2019   Procedure: RIGHT/LEFT HEART CATH AND CORONARY ANGIOGRAPHY;  Surgeon: Dann Candyce RAMAN, MD;  Location: [facility] INVASIVE CV LAB;  Service: Cardiovascular;  Laterality: N/A;     Social History:   reports that he quit smoking about 35 years ago. His smoking use included cigarettes. He started smoking about 50 years ago. He has a 15 pack-year smoking history. He has never used smokeless tobacco. He reports that he does not currently use alcohol. He reports that he does not use drugs.   Family History:  His family history includes Diabetes in his father and sister; Heart attack in his  father; Heart disease in his sister; Kidney disease in his mother.   Allergies No Known Allergies   Home Medications  Prior to Admission medications   Medication Sig Start Date End Date Taking? Authorizing Provider  acetaminophen  (TYLENOL ) 500 MG tablet Take 1,000 mg by mouth.   Yes [provider]  albuterol  (VENTOLIN  HFA) 108 (90 Base) MCG/ACT inhaler Inhale 2 puffs into the lungs every 6 (six) hours as needed for wheezing or shortness of breath. 02/10/24  Yes Pauline Garnette LABOR, MD  aspirin  81 MG chewable tablet Chew 1 tablet (81 mg total) by mouth daily. 06/08/19  Yes Rosario Leatrice FERNS, MD  carvedilol  (COREG ) 6.25 MG tablet Take 1 tablet (6.25 mg total) by mouth 2 (two) times daily with a meal. Patient must schedule annual appointment for further refills 03/28/24  Yes Croitoru, Mihai, MD  cetirizine (ZYRTEC) 10 MG tablet Take 10 mg by mouth daily.   Yes [provider]  FARXIGA  10 MG TABS tablet Take 1 tablet (10 mg total) by mouth daily before breakfast. Patient must schedule annual appointment for further refills 03/28/24  Yes Croitoru, Mihai, MD  Multiple Vitamin (MULTIVITAMIN WITH MINERALS) TABS tablet Take 1 tablet by mouth daily.   Yes [provider]  rosuvastatin  (CRESTOR ) 10 MG tablet Take 1 tablet (10 mg total) by mouth daily. Patient must schedule annual appointment for further refills 03/28/24  Yes Croitoru, Mihai, MD  sacubitril -valsartan  (ENTRESTO ) 97-103 MG Take 1 tablet by mouth twice daily 09/21/23  Yes Croitoru, Mihai, MD  torsemide  (DEMADEX ) 20 MG tablet Take 1 tablet (20 mg total) by mouth as needed (Swelling). Patient taking differently: Take 20 mg by mouth daily. 11/10/23  Yes Croitoru, Mihai, MD  amoxicillin -clavulanate (AUGMENTIN ) 875-125 MG tablet Take one tab PO Q12hr with food Patient not taking: Reported on 06/02/2024 02/10/24   Pauline Garnette LABOR, MD  benzonatate  (TESSALON ) 100 MG capsule Take 1 capsule (100 mg total) by mouth 3 (three) times daily  as needed. Patient not taking: Reported on 06/02/2024 01/20/24   Kennyth Domino, FNP  fluticasone  (FLONASE ) 50 MCG/ACT nasal spray Place 2 sprays into both nostrils daily. Patient not taking: Reported on 07/12/2023 11/19/19   Gretta Doffing P, DO  montelukast  (SINGULAIR ) 10 MG tablet Take one tab PO QHS Patient not taking: Reported on 06/02/2024 02/10/24   Pauline Garnette LABOR, MD    35 minutes of critical care time is spent taking care of this patient excluding any procedures.   Tamela Stakes, MD  Attending Physician, Critical Care Medicine [facility] See Amion for pager If no response to pager, please call [phone] until 7pm After 7pm, Please call E-link [phone]

## 2024-06-04 NOTE — Progress Notes (Signed)
 PT Cancellation/Discharge Note  Patient Details Name: Justin Rasmussen MRN: [id number] DOB: [birth year]   Cancelled Treatment:    Reason Eval/Treat Not Completed: Patient not medically ready  Noted pt now scheduled for surgery 9/30. Will defer evaluation to post-op. Please re-order PT when appropriate.    Macario RAMAN, PT Acute Rehabilitation Services  Office [phone]  Macario SHAUNNA Soja 06/04/2024, 12:11 PM

## 2024-06-04 NOTE — Progress Notes (Signed)
 eLink Physician-Brief Progress Note Patient Name: Edith Lord DOB: 22-Nov-1955 MRN: [id number]   Date of Service  06/04/2024  HPI/Events of Note  did not sleep the night prior. nurse talking to patient and wife. he takes benadryl PM at home  think he could get something to help him rest? takes po meds. NPO after midnight for crani in AM  Discussed with RN   eICU Interventions  Will first try melatonin . Call back if not better     Intervention Category Minor Interventions: Other:  Jodelle ONEIDA Hutching 06/04/2024, 9:17 PM

## 2024-06-05 ENCOUNTER — Other Ambulatory Visit: Payer: Self-pay

## 2024-06-05 ENCOUNTER — Inpatient Hospital Stay (HOSPITAL_COMMUNITY)

## 2024-06-05 ENCOUNTER — Encounter (HOSPITAL_COMMUNITY): Payer: Self-pay | Admitting: Neurology

## 2024-06-05 ENCOUNTER — Encounter (HOSPITAL_COMMUNITY)
Admission: EM | Disposition: A | Discharge status: Rehabilitation Facility | Payer: Self-pay | Source: Home / Self Care | Attending: Neurology

## 2024-06-05 ENCOUNTER — Inpatient Hospital Stay (HOSPITAL_COMMUNITY): Payer: Self-pay | Admitting: Certified Registered"

## 2024-06-05 DIAGNOSIS — Z85820 Personal history of malignant melanoma of skin: Secondary | ICD-10-CM | POA: Diagnosis not present

## 2024-06-05 DIAGNOSIS — C7931 Secondary malignant neoplasm of brain: Secondary | ICD-10-CM | POA: Diagnosis not present

## 2024-06-05 DIAGNOSIS — R29707 NIHSS score 7: Secondary | ICD-10-CM

## 2024-06-05 DIAGNOSIS — C801 Malignant (primary) neoplasm, unspecified: Secondary | ICD-10-CM

## 2024-06-05 DIAGNOSIS — I609 Nontraumatic subarachnoid hemorrhage, unspecified: Secondary | ICD-10-CM | POA: Diagnosis not present

## 2024-06-05 DIAGNOSIS — G932 Benign intracranial hypertension: Secondary | ICD-10-CM | POA: Diagnosis not present

## 2024-06-05 DIAGNOSIS — I615 Nontraumatic intracerebral hemorrhage, intraventricular: Secondary | ICD-10-CM | POA: Diagnosis not present

## 2024-06-05 DIAGNOSIS — I611 Nontraumatic intracerebral hemorrhage in hemisphere, cortical: Secondary | ICD-10-CM | POA: Diagnosis not present

## 2024-06-05 DIAGNOSIS — I5021 Acute systolic (congestive) heart failure: Secondary | ICD-10-CM

## 2024-06-05 HISTORY — PX: APPLICATION OF CRANIAL NAVIGATION: SHX6578

## 2024-06-05 HISTORY — PX: CRANIOTOMY: SHX93

## 2024-06-05 LAB — POCT I-STAT 7, (LYTES, BLD GAS, ICA,H+H)
Acid-Base Excess: 1 mmol/L (ref 0.0–2.0)
Bicarbonate: 28 mmol/L (ref 20.0–28.0)
Calcium, Ion: 1.2 mmol/L (ref 1.15–1.40)
HCT: 31 % — ABNORMAL LOW (ref 39.0–52.0)
Hemoglobin: 10.5 g/dL — ABNORMAL LOW (ref 13.0–17.0)
O2 Saturation: 100 %
Patient temperature: 98.6
Potassium: 4.1 mmol/L (ref 3.5–5.1)
Sodium: 157 mmol/L — ABNORMAL HIGH (ref 135–145)
TCO2: 30 mmol/L (ref 22–32)
pCO2 arterial: 53.5 mmHg — ABNORMAL HIGH (ref 32–48)
pH, Arterial: 7.327 — ABNORMAL LOW (ref 7.35–7.45)
pO2, Arterial: 256 mmHg — ABNORMAL HIGH (ref 83–108)

## 2024-06-05 LAB — GLUCOSE, CAPILLARY
Glucose-Capillary: 149 mg/dL — ABNORMAL HIGH (ref 70–99)
Glucose-Capillary: 124 mg/dL — ABNORMAL HIGH (ref 70–99)
Glucose-Capillary: 144 mg/dL — ABNORMAL HIGH (ref 70–99)
Glucose-Capillary: 145 mg/dL — ABNORMAL HIGH (ref 70–99)
Glucose-Capillary: 138 mg/dL — ABNORMAL HIGH (ref 70–99)
Glucose-Capillary: 142 mg/dL — ABNORMAL HIGH (ref 70–99)

## 2024-06-05 LAB — TYPE AND SCREEN
ABO/RH(D): O POS
Antibody Screen: NEGATIVE

## 2024-06-05 LAB — CBC
HCT: 38.2 % — ABNORMAL LOW (ref 39.0–52.0)
Hemoglobin: 11.8 g/dL — ABNORMAL LOW (ref 13.0–17.0)
MCH: 26.5 pg (ref 26.0–34.0)
MCHC: 30.9 g/dL (ref 30.0–36.0)
MCV: 85.8 fL (ref 80.0–100.0)
Platelets: 326 K/uL (ref 150–400)
RBC: 4.45 MIL/uL (ref 4.22–5.81)
RDW: 16 % — ABNORMAL HIGH (ref 11.5–15.5)
WBC: 13.8 K/uL — ABNORMAL HIGH (ref 4.0–10.5)
nRBC: 0 % (ref 0.0–0.2)

## 2024-06-05 LAB — BASIC METABOLIC PANEL WITH GFR
Anion gap: 9 (ref 5–15)
BUN: 34 mg/dL — ABNORMAL HIGH (ref 8–23)
CO2: 26 mmol/L (ref 22–32)
Calcium: 8.4 mg/dL — ABNORMAL LOW (ref 8.9–10.3)
Chloride: 120 mmol/L — ABNORMAL HIGH (ref 98–111)
Creatinine, Ser: 1.35 mg/dL — ABNORMAL HIGH (ref 0.61–1.24)
GFR, Estimated: 58 mL/min — ABNORMAL LOW (ref >60)
Glucose, Bld: 146 mg/dL — ABNORMAL HIGH (ref 70–99)
Potassium: 4.2 mmol/L (ref 3.5–5.1)
Sodium: 155 mmol/L — ABNORMAL HIGH (ref 135–145)

## 2024-06-05 LAB — SODIUM
Sodium: 154 mmol/L — ABNORMAL HIGH (ref 135–145)
Sodium: 156 mmol/L — ABNORMAL HIGH (ref 135–145)

## 2024-06-05 LAB — ABO/RH: ABO/RH(D): O POS

## 2024-06-05 SURGERY — CRANIOTOMY TUMOR EXCISION
Anesthesia: General

## 2024-06-05 MED ORDER — FENTANYL CITRATE (PF) 100 MCG/2ML IJ SOLN
25.0000 ug | Freq: Once | INTRAMUSCULAR | Status: AC
  Administered 2024-06-05: 25 ug via INTRAVENOUS

## 2024-06-05 MED ORDER — PROPOFOL 10 MG/ML IV BOLUS
INTRAVENOUS | Status: AC
Start: 2024-06-05 — End: 2024-06-05
  Filled 2024-06-05: qty 20

## 2024-06-05 MED ORDER — GELATIN ABSORBABLE 100 EX MISC
CUTANEOUS | Status: DC | PRN
  Administered 2024-06-05: 1 via TOPICAL

## 2024-06-05 MED ORDER — DEXAMETHASONE SODIUM PHOSPHATE 10 MG/ML IJ SOLN
INTRAMUSCULAR | Status: DC | PRN
  Administered 2024-06-05: 4 mg via INTRAVENOUS

## 2024-06-05 MED ORDER — PROPOFOL 1000 MG/100ML IV EMUL
0.0000 ug/kg/min | INTRAVENOUS | Status: DC
  Administered 2024-06-05 (×3): 50 ug/kg/min via INTRAVENOUS
  Administered 2024-06-05: 70 ug/kg/min via INTRAVENOUS
  Administered 2024-06-06 (×2): 50 ug/kg/min via INTRAVENOUS
  Filled 2024-06-05 (×7): qty 100

## 2024-06-05 MED ORDER — ESMOLOL HCL 100 MG/10ML IV SOLN
INTRAVENOUS | Status: AC
  Filled 2024-06-05: qty 10

## 2024-06-05 MED ORDER — SODIUM CHLORIDE 0.9 % IV SOLN: INTRAVENOUS | Status: DC | PRN

## 2024-06-05 MED ORDER — SODIUM CHLORIDE 0.9 % IV SOLN: INTRAVENOUS | Status: DC

## 2024-06-05 MED ORDER — DOCUSATE SODIUM 50 MG/5ML PO LIQD
100.0000 mg | Freq: Two times a day (BID) | ORAL | Status: DC
Start: 2024-06-05 — End: 2024-06-06
  Administered 2024-06-05 – 2024-06-06 (×2): 100 mg
  Filled 2024-06-05 (×2): qty 10

## 2024-06-05 MED ORDER — ROCURONIUM BROMIDE 10 MG/ML (PF) SYRINGE
PREFILLED_SYRINGE | INTRAVENOUS | Status: DC | PRN
  Administered 2024-06-05: 20 mg via INTRAVENOUS
  Administered 2024-06-05: 80 mg via INTRAVENOUS
  Administered 2024-06-05: 40 mg via INTRAVENOUS

## 2024-06-05 MED ORDER — LIDOCAINE 2% (20 MG/ML) 5 ML SYRINGE
INTRAMUSCULAR | Status: AC
  Filled 2024-06-05: qty 5

## 2024-06-05 MED ORDER — FENTANYL CITRATE (PF) 250 MCG/5ML IJ SOLN
INTRAMUSCULAR | Status: DC | PRN
  Administered 2024-06-05: 75 ug via INTRAVENOUS
  Administered 2024-06-05: 25 ug via INTRAVENOUS

## 2024-06-05 MED ORDER — BUPIVACAINE HCL (PF) 0.5 % IJ SOLN
INTRAMUSCULAR | Status: AC
Start: 2024-06-05 — End: 2024-06-05
  Filled 2024-06-05: qty 30

## 2024-06-05 MED ORDER — PROPOFOL 10 MG/ML IV BOLUS
INTRAVENOUS | Status: DC | PRN
  Administered 2024-06-05: 180 mg via INTRAVENOUS
  Administered 2024-06-05: 50 mg via INTRAVENOUS

## 2024-06-05 MED ORDER — ORAL CARE MOUTH RINSE
15.0000 mL | OROMUCOSAL | Status: DC
  Administered 2024-06-05 – 2024-06-07 (×20): 15 mL via OROMUCOSAL

## 2024-06-05 MED ORDER — PROPOFOL 10 MG/ML IV BOLUS
INTRAVENOUS | Status: AC
  Filled 2024-06-05: qty 20

## 2024-06-05 MED ORDER — FENTANYL CITRATE (PF) 250 MCG/5ML IJ SOLN
INTRAMUSCULAR | Status: AC
  Filled 2024-06-05: qty 5

## 2024-06-05 MED ORDER — POLYETHYLENE GLYCOL 3350 17 G PO PACK
17.0000 g | PACK | Freq: Every day | ORAL | Status: DC
  Administered 2024-06-06 – 2024-06-09 (×3): 17 g
  Filled 2024-06-05 (×3): qty 1

## 2024-06-05 MED ORDER — THROMBIN 5000 UNITS EX KIT
PACK | CUTANEOUS | Status: AC
Start: 2024-06-05 — End: 2024-06-05
  Filled 2024-06-05: qty 1

## 2024-06-05 MED ORDER — THROMBIN 20000 UNITS EX SOLR
CUTANEOUS | Status: AC
  Filled 2024-06-05: qty 20000

## 2024-06-05 MED ORDER — CHLORHEXIDINE GLUCONATE 0.12 % MT SOLN
OROMUCOSAL | Status: AC
  Administered 2024-06-05: 15 mL via OROMUCOSAL
  Filled 2024-06-05: qty 15

## 2024-06-05 MED ORDER — PHENYLEPHRINE 80 MCG/ML (10ML) SYRINGE FOR IV PUSH (FOR BLOOD PRESSURE SUPPORT)
PREFILLED_SYRINGE | INTRAVENOUS | Status: AC
  Filled 2024-06-05: qty 10

## 2024-06-05 MED ORDER — ONDANSETRON HCL 4 MG/2ML IJ SOLN
INTRAMUSCULAR | Status: DC | PRN
  Administered 2024-06-05: 4 mg via INTRAVENOUS

## 2024-06-05 MED ORDER — SODIUM CHLORIDE 0.9 % IV SOLN
0.1500 ug/kg/min | INTRAVENOUS | Status: AC
  Administered 2024-06-05: .05 ug/kg/min via INTRAVENOUS
  Filled 2024-06-05: qty 2000

## 2024-06-05 MED ORDER — ACETAMINOPHEN 10 MG/ML IV SOLN
INTRAVENOUS | Status: DC | PRN
  Administered 2024-06-05: 1000 mg via INTRAVENOUS

## 2024-06-05 MED ORDER — PROPOFOL 500 MG/50ML IV EMUL
INTRAVENOUS | Status: DC | PRN
  Administered 2024-06-05 (×2): 100 ug/kg/min via INTRAVENOUS

## 2024-06-05 MED ORDER — THROMBIN 20000 UNITS EX SOLR
CUTANEOUS | Status: DC | PRN
  Administered 2024-06-05: 5 mL via TOPICAL

## 2024-06-05 MED ORDER — CEFAZOLIN SODIUM 1 G IJ SOLR
INTRAMUSCULAR | Status: AC
  Filled 2024-06-05: qty 30

## 2024-06-05 MED ORDER — CEFAZOLIN SODIUM-DEXTROSE 2-3 GM-%(50ML) IV SOLR
INTRAVENOUS | Status: DC | PRN
  Administered 2024-06-05: 3 g via INTRAVENOUS

## 2024-06-05 MED ORDER — FENTANYL BOLUS VIA INFUSION
25.0000 ug | INTRAVENOUS | Status: DC | PRN
  Administered 2024-06-05: 50 ug via INTRAVENOUS
  Administered 2024-06-05: 25 ug via INTRAVENOUS

## 2024-06-05 MED ORDER — SUGAMMADEX SODIUM 200 MG/2ML IV SOLN
INTRAVENOUS | Status: DC | PRN
  Administered 2024-06-05: 550 mg via INTRAVENOUS

## 2024-06-05 MED ORDER — THROMBIN 5000 UNITS EX SOLR
OROMUCOSAL | Status: DC | PRN
  Administered 2024-06-05 (×2): 5 mL via TOPICAL

## 2024-06-05 MED ORDER — BUPIVACAINE HCL (PF) 0.5 % IJ SOLN
INTRAMUSCULAR | Status: AC
  Filled 2024-06-05: qty 30

## 2024-06-05 MED ORDER — ORAL CARE MOUTH RINSE: 15.0000 mL | Freq: Once | OROMUCOSAL | Status: AC

## 2024-06-05 MED ORDER — 0.9 % SODIUM CHLORIDE (POUR BTL) OPTIME
TOPICAL | Status: DC | PRN
  Administered 2024-06-05: 3000 mL

## 2024-06-05 MED ORDER — HEMOSTATIC AGENTS (NO CHARGE) OPTIME
TOPICAL | Status: DC | PRN
  Administered 2024-06-05: 1 via TOPICAL

## 2024-06-05 MED ORDER — SENNOSIDES-DOCUSATE SODIUM 8.6-50 MG PO TABS
1.0000 | ORAL_TABLET | Freq: Two times a day (BID) | ORAL | Status: DC
  Administered 2024-06-05 – 2024-06-10 (×7): 1
  Filled 2024-06-05 (×7): qty 1

## 2024-06-05 MED ORDER — PROMETHAZINE HCL 12.5 MG PO TABS: 12.5000 mg | ORAL_TABLET | ORAL | Status: DC | PRN

## 2024-06-05 MED ORDER — FENTANYL 2500MCG IN NS 250ML (10MCG/ML) PREMIX INFUSION
0.0000 ug/h | INTRAVENOUS | Status: DC
  Administered 2024-06-05: 25 ug/h via INTRAVENOUS
  Filled 2024-06-05: qty 250

## 2024-06-05 MED ORDER — PHENYLEPHRINE HCL-NACL 20-0.9 MG/250ML-% IV SOLN
INTRAVENOUS | Status: DC | PRN
  Administered 2024-06-05: 25 ug/min via INTRAVENOUS

## 2024-06-05 MED ORDER — ROCURONIUM BROMIDE 10 MG/ML (PF) SYRINGE
PREFILLED_SYRINGE | INTRAVENOUS | Status: AC
  Filled 2024-06-05: qty 10

## 2024-06-05 MED ORDER — BACITRACIN ZINC 500 UNIT/GM EX OINT
TOPICAL_OINTMENT | CUTANEOUS | Status: DC | PRN
  Administered 2024-06-05: 1 via TOPICAL

## 2024-06-05 MED ORDER — BUPIVACAINE HCL (PF) 0.5 % IJ SOLN
INTRAMUSCULAR | Status: DC | PRN
  Administered 2024-06-05: 5 mL

## 2024-06-05 MED ORDER — CHLORHEXIDINE GLUCONATE 0.12 % MT SOLN: 15.0000 mL | Freq: Once | OROMUCOSAL | Status: AC

## 2024-06-05 MED ORDER — LABETALOL HCL 5 MG/ML IV SOLN
INTRAVENOUS | Status: DC | PRN
  Administered 2024-06-05 (×3): 5 mg via INTRAVENOUS

## 2024-06-05 MED ORDER — THROMBIN 5000 UNITS EX KIT
PACK | CUTANEOUS | Status: AC
  Filled 2024-06-05: qty 1

## 2024-06-05 MED ORDER — ORAL CARE MOUTH RINSE: 15.0000 mL | OROMUCOSAL | Status: DC | PRN

## 2024-06-05 MED ORDER — LIDOCAINE 2% (20 MG/ML) 5 ML SYRINGE
INTRAMUSCULAR | Status: DC | PRN
  Administered 2024-06-05: 40 mg via INTRAVENOUS

## 2024-06-05 MED ORDER — LIDOCAINE-EPINEPHRINE 1 %-1:100000 IJ SOLN
INTRAMUSCULAR | Status: AC
  Filled 2024-06-05: qty 1

## 2024-06-05 MED ORDER — BACITRACIN ZINC 500 UNIT/GM EX OINT
TOPICAL_OINTMENT | CUTANEOUS | Status: AC
  Filled 2024-06-05: qty 28.35

## 2024-06-05 MED ORDER — FAMOTIDINE 20 MG PO TABS
20.0000 mg | ORAL_TABLET | Freq: Two times a day (BID) | ORAL | Status: DC
Start: 2024-06-05 — End: 2024-06-06
  Administered 2024-06-05 – 2024-06-06 (×2): 20 mg
  Filled 2024-06-05 (×2): qty 1

## 2024-06-05 MED ORDER — LABETALOL HCL 5 MG/ML IV SOLN
INTRAVENOUS | Status: AC
  Filled 2024-06-05: qty 4

## 2024-06-05 MED ORDER — LIDOCAINE-EPINEPHRINE 1 %-1:100000 IJ SOLN
INTRAMUSCULAR | Status: DC | PRN
  Administered 2024-06-05: 5 mL

## 2024-06-05 SURGICAL SUPPLY — 84 items
BAG COUNTER SPONGE SURGICOUNT (BAG) ×1 IMPLANT
BAND RUBBER #18 3X1/16 STRL (MISCELLANEOUS) ×2 IMPLANT
BENZOIN TINCTURE PRP APPL 2/3 (GAUZE/BANDAGES/DRESSINGS) IMPLANT
BLADE CLIPPER SURG (BLADE) ×1 IMPLANT
BLADE SAW GIGLI 16 STRL (MISCELLANEOUS) IMPLANT
BLADE SURG 15 STRL LF DISP TIS (BLADE) IMPLANT
BLADE ULTRA TIP 2M (BLADE) ×1 IMPLANT
BNDG GAUZE DERMACEA FLUFF 4 (GAUZE/BANDAGES/DRESSINGS) IMPLANT
BNDG STRETCH 4X75 STRL LF (GAUZE/BANDAGES/DRESSINGS) IMPLANT
BUR ROUND FLUTED 5 RND (BURR) ×1 IMPLANT
BUR SPIRAL ROUTER 2.3 (BUR) ×1 IMPLANT
CANISTER SUCTION 3000ML PPV (SUCTIONS) ×2 IMPLANT
CATH VENTRIC 35X38 W/TROCAR LG (CATHETERS) IMPLANT
CLIP TI MEDIUM 6 (CLIP) IMPLANT
CNTNR URN SCR LID CUP LEK RST (MISCELLANEOUS) ×1 IMPLANT
COVER BURR HOLE 7 (Orthopedic Implant) IMPLANT
COVER MAYO STAND STRL (DRAPES) IMPLANT
DRAIN SUBARACHNOID (WOUND CARE) IMPLANT
DRAPE HALF SHEET 40X57 (DRAPES) ×1 IMPLANT
DRAPE MICROSCOPE SLANT 54X150 (MISCELLANEOUS) IMPLANT
DRAPE NEUROLOGICAL W/INCISE (DRAPES) ×1 IMPLANT
DRAPE POUCH INSTRU U-SHP 10X18 (DRAPES) IMPLANT
DRAPE STERI IOBAN 125X83 (DRAPES) IMPLANT
DRAPE SURG 17X23 STRL (DRAPES) IMPLANT
DRAPE WARM FLUID 44X44 (DRAPES) ×1 IMPLANT
DRSG ADAPTIC 3X8 NADH LF (GAUZE/BANDAGES/DRESSINGS) IMPLANT
DRSG TELFA 3X8 NADH STRL (GAUZE/BANDAGES/DRESSINGS) IMPLANT
DURAPREP 6ML APPLICATOR 50/CS (WOUND CARE) ×1 IMPLANT
ELECTRODE REM PT RTRN 9FT ADLT (ELECTROSURGICAL) ×1 IMPLANT
EVACUATOR 1/8 PVC DRAIN (DRAIN) IMPLANT
EVACUATOR SILICONE 100CC (DRAIN) IMPLANT
FEE COVERAGE SUPPORT O-ARM (MISCELLANEOUS) ×1 IMPLANT
FORCEPS BIPOLAR SPETZLER 8 1.0 (NEUROSURGERY SUPPLIES) ×1 IMPLANT
GAUZE 4X4 16PLY ~~LOC~~+RFID DBL (SPONGE) IMPLANT
GAUZE SPONGE 4X4 12PLY STRL (GAUZE/BANDAGES/DRESSINGS) ×1 IMPLANT
GLOVE BIOGEL PI IND STRL 7.0 (GLOVE) IMPLANT
GLOVE BIOGEL PI IND STRL 7.5 (GLOVE) ×2 IMPLANT
GLOVE ECLIPSE 7.0 STRL STRAW (GLOVE) ×2 IMPLANT
GLOVE EXAM NITRILE XL STR (GLOVE) IMPLANT
GOWN STRL REUS W/ TWL LRG LVL3 (GOWN DISPOSABLE) ×2 IMPLANT
GOWN STRL REUS W/ TWL XL LVL3 (GOWN DISPOSABLE) IMPLANT
GOWN STRL REUS W/TWL 2XL LVL3 (GOWN DISPOSABLE) IMPLANT
HEMOSTAT POWDER KIT SURGIFOAM (HEMOSTASIS) ×1 IMPLANT
HEMOSTAT SURGICEL 2X14 (HEMOSTASIS) ×1 IMPLANT
HOOK DURA 1/2IN (MISCELLANEOUS) ×1 IMPLANT
IV 0.9% NACL 1000 ML (IV SOLUTION) ×1 IMPLANT
KIT BASIN OR (CUSTOM PROCEDURE TRAY) ×1 IMPLANT
KIT DRAIN CSF ACCUDRAIN (MISCELLANEOUS) IMPLANT
KIT TURNOVER KIT B (KITS) ×1 IMPLANT
KNIFE ARACHNOID DISP AM-23-SB (BLADE) IMPLANT
KNIFE ARACHNOID DISP AM-24-S (MISCELLANEOUS) ×1 IMPLANT
MARKER SPHERE PSV REFLC NDI (MISCELLANEOUS) ×3 IMPLANT
NDL HYPO 22X1.5 SAFETY MO (MISCELLANEOUS) ×1 IMPLANT
NDL SPNL 18GX3.5 QUINCKE PK (NEEDLE) IMPLANT
NEEDLE HYPO 22X1.5 SAFETY MO (MISCELLANEOUS) ×1 IMPLANT
NEEDLE SPNL 18GX3.5 QUINCKE PK (NEEDLE) IMPLANT
PACK BATTERY CMF DISP FOR DVR (ORTHOPEDIC DISPOSABLE SUPPLIES) IMPLANT
PACK CRANIOTOMY CUSTOM (CUSTOM PROCEDURE TRAY) ×1 IMPLANT
PATTIES SURGICAL .5 X3 (DISPOSABLE) IMPLANT
PIN MAYFIELD SKULL DISP (PIN) ×1 IMPLANT
SCREW UNIII AXS SD 1.5X4 (Screw) IMPLANT
SOLN 0.9% NACL 1000 ML (IV SOLUTION) ×3 IMPLANT
SOLN 0.9% NACL POUR BTL 1000ML (IV SOLUTION) ×3 IMPLANT
SOLN STERILE WATER 1000 ML (IV SOLUTION) ×1 IMPLANT
SOLN STERILE WATER BTL 1000 ML (IV SOLUTION) ×1 IMPLANT
SPECIMEN JAR SMALL (MISCELLANEOUS) IMPLANT
SPIKE FLUID TRANSFER (MISCELLANEOUS) ×1 IMPLANT
SPONGE NEURO XRAY DETECT 1X3 (DISPOSABLE) IMPLANT
SPONGE SURGIFOAM ABS GEL 100 (HEMOSTASIS) ×1 IMPLANT
STAPLER SKIN PROX 35W (STAPLE) ×1 IMPLANT
STOCKINETTE 6 STRL (DRAPES) IMPLANT
SUT 3-0 BLK 1X30 PSL (SUTURE) IMPLANT
SUT ETHILON 3 0 PS 1 (SUTURE) IMPLANT
SUT NURALON 4 0 TR CR/8 (SUTURE) ×3 IMPLANT
SUT SILK 0 TIES 10X30 (SUTURE) IMPLANT
SUT VIC AB 0 CT1 18XCR BRD8 (SUTURE) ×2 IMPLANT
SUT VIC AB 3-0 SH 8-18 (SUTURE) ×2 IMPLANT
TAPE CLOTH 1X10 TAN NS (GAUZE/BANDAGES/DRESSINGS) ×1 IMPLANT
TOWEL GREEN STERILE (TOWEL DISPOSABLE) ×1 IMPLANT
TOWEL GREEN STERILE FF (TOWEL DISPOSABLE) ×1 IMPLANT
TRAY FOLEY MTR SLVR 16FR STAT (SET/KITS/TRAYS/PACK) ×1 IMPLANT
TUBE CONNECTING 12X1/4 (SUCTIONS) ×1 IMPLANT
TUBING FEATHERFLOW (TUBING) IMPLANT
UNDERPAD 30X36 HEAVY ABSORB (UNDERPADS AND DIAPERS) ×1 IMPLANT

## 2024-06-05 NOTE — Op Note (Signed)
 NEUROSURGERY OPERATIVE NOTE   PREOP DIAGNOSIS:  Metastatic Melanoma to Brain Intracranial hypertension   POSTOP DIAGNOSIS: Same  PROCEDURE: Stereotactic left frontal craniotomy for resection of tumor Evacuation of left frontal hematoma Use of intraoperative microscope for microdissection  SURGEON: Dr. Gerldine Maizes, MD  ASSISTANT: Camie Pickle, PA-C  ANESTHESIA: General Endotracheal  EBL: 200cc  SPECIMENS: Left frontal tumor for permanent pathology  DRAINS: None  COMPLICATIONS: None immediate  CONDITION: Hemodynamically stable to PACU  HISTORY: Justin Rasmussen is a 68 y.o. male with a history of melanoma diagnosed from a cutaneous lesion on his right leg about a year ago.  Patient underwent resection of the lesion however lymph nodes were noted to be positive.  Patient has been on immunotherapy but neurologically normal.  He presented to the hospital few days ago with near global aphasia and mild right hemiparesis.  Workup included CT and subsequent MRI demonstrating a hemorrhagic left frontal tumor.  His case was discussed at the multidisciplinary neuro-oncology conference.  He presents for surgical resection of the lesion and evacuation of hematoma for relief of mass effect.  The risks, benefits, and alternatives to surgery were all reviewed in detail with the patient and his wife.  After all questions were answered informed consent was obtained and witnessed.  PROCEDURE IN DETAIL: The patient was brought to the operating room. After induction of general anesthesia, the patient was positioned on the operative table in the [place] head holder in the supine position. All pressure points were meticulously padded.  Utilizing preoperative CT angiogram, surface markers were co-registered until a satisfactory accuracy was achieved with the navigation system.  Left-sided curvilinear frontal skin incision was then marked out and prepped and draped in the usual sterile  fashion.  After timeout was conducted, the curvilinear skin incision was infiltrated with local anesthetic with epinephrine .  Incision was then made sharply and carried down through the galea.  Raney clips were applied.  Temporalis muscle and fascia were then incised as was the periosteum and a single piece myocutaneous flap was elevated and reflected anteriorly.  Utilizing the stereotactic system, I planned out a craniotomy just lateral to the midline to encompass the tumor and surrounding hematoma.  Multiple bur holes were then created and connected with the craniotome.  A single piece frontal craniotomy flap was then elevated.  The dura was then incised and reflected medially.  The underlying brain was noted to be hemosiderin stained, and under some pressure as the dura was opened.  Utilizing the stereotactic system, I made a corticectomy at the posterior margin of the exposed brain.  Immediately subjacent to the cortex we encountered a large hematoma which was easily removed with suction.  This completely decompressed the brain.  At this point the microscope was draped sterilely and brought into the field and the remainder of the tumor resection was done under the microscope using microdissection technique.  Using a combination of bipolar electrocautery and the suction, the tumor was carefully dissected away from surrounding white matter.  This was initially done posteriorly and medially, followed by more anteriorly and laterally.  Finally the deep margin was already dissected due to the presence of the hematoma.  The tumor was then removed in a single piece and sent for permanent pathology.  At this point the resection cavity was inspected.  There was no obvious residual tumor identified.  Hemostasis on the cavity was achieved using a combination of bipolar electrocautery and morselized Gelfoam and thrombin.  The wound was then irrigated  with normal saline.  No active bleeding was identified.  Dura was  then reapproximated with interrupted 4-0 Nurolon stitches.  A piece of Gelfoam was placed over the dura.  The bone flap was then replaced and plated with standard titanium plates and screws.  The temporalis muscle and fascia were then reapproximated with interrupted 0 Vicryl stitches.  Kerney was reapproximated with interrupted 3-0 Vicryl stitches and the skin was closed with staples.  Patient was then removed from the [place] head holder.  Sterile dressing and head wrap was applied.  He was then transferred to the stretcher and extubated.  At the end of the case all sponge, needle, and instrument counts were correct. The patient was then taken to the post-anesthesia care unit in stable hemodynamic condition.   Gerldine Maizes, MD [facility]

## 2024-06-05 NOTE — Anesthesia Procedure Notes (Signed)
 Procedure Name: Intubation Date/Time: 06/05/2024 11:10 AM  Performed by: Genny Gun, CRNAPre-anesthesia Checklist: Patient identified, Patient being monitored, Timeout performed, Emergency Drugs available and Suction available Patient Re-evaluated:Patient Re-evaluated prior to induction Oxygen Delivery Method: Circle system utilized Preoxygenation: Pre-oxygenation with 100% oxygen Induction Type: IV induction Ventilation: Mask ventilation without difficulty and Oral airway inserted - appropriate to patient size Laryngoscope Size: Mac, Glidescope and 4 Grade View: Grade I Tube type: Oral Tube size: 7.0 mm Number of attempts: 1 Airway Equipment and Method: Stylet Placement Confirmation: ETT inserted through vocal cords under direct vision, positive ETCO2 and breath sounds checked- equal and bilateral Secured at: 22 cm Tube secured with: Tape Dental Injury: Teeth and Oropharynx as per pre-operative assessment  Comments: Intubated by NORVA Speed under MD/Do and CRNA supervision.

## 2024-06-05 NOTE — Progress Notes (Signed)
  NEUROSURGERY PROGRESS NOTE   Pt seen and examined. No issues overnight.   EXAM: Temp:  [98 F (36.7 C)-98.7 F (37.1 C)] 98.2 F (36.8 C) (09/30 0400) Pulse Rate:  [61-127] 75 (09/30 0700) Resp:  [15-32] 29 (09/30 0700) BP: (130-164)/(54-93) 134/54 (09/30 0700) SpO2:  [92 %-100 %] 94 % (09/30 0700) Weight:  [137.3 kg] 137.3 kg (09/30 0500) Intake/Output      09/29 0701 09/30 0700 09/30 0701 10/01 0700   I.V. (mL/kg) 1175.1 (8.6)    IV Piggyback 10    Total Intake(mL/kg) 1185.1 (8.6)    Urine (mL/kg/hr) 575 (0.2)    Total Output 575    Net +610.1          Awake, alert Is able to say a few single words Face symmetric Good strength LUE/LLE 3-4/5 RUE/RLE  LABS: Lab Results  Component Value Date   CREATININE 1.35 (H) 06/05/2024   BUN 34 (H) 06/05/2024   NA 155 (H) 06/05/2024   K 4.2 06/05/2024   CL 120 (H) 06/05/2024   CO2 26 06/05/2024   Lab Results  Component Value Date   WBC 13.8 (H) 06/05/2024   HGB 11.8 (L) 06/05/2024   HCT 38.2 (L) 06/05/2024   MCV 85.8 06/05/2024   PLT 326 06/05/2024     IMPRESSION: - 68 y.o. male with metastatic melanoma including left frontal hemorrhagic metastasis with associated mass effect and resultant global aphasia and right hemiparesis.  PLAN: - Will proceed with craniotomy for resection of tumor and evacuation of hematoma today.  I have reviewed the indications for the procedure as well as the details of the procedure and the expected postoperative course and recovery at length with the patient and wife at bedside. We have also reviewed in detail the risks, benefits, and alternatives to the procedure. All questions were answered and Justin Rasmussen provided informed consent to proceed.  Gerldine Maizes, MD [facility]

## 2024-06-05 NOTE — Anesthesia Procedure Notes (Signed)
 Arterial Line Insertion Start/End9/30/2025 10:35 AM, 06/05/2024 10:45 AM Performed by: Lettie Derrek Dacosta, CRNA, CRNA  Patient location: Pre-op. Preanesthetic checklist: patient identified, IV checked, site marked, risks and benefits discussed, surgical consent, monitors and equipment checked, pre-op evaluation, timeout performed and anesthesia consent Lidocaine  1% used for infiltration Left, radial was placed Catheter size: 20 G Hand hygiene performed  and maximum sterile barriers used  Allen's test indicative of satisfactory collateral circulation Attempts: 3 (First attempt made by NORVA Speed, 2nd By CRNA Hester Joslin) Procedure performed without using ultrasound guided technique. Following insertion, dressing applied and Biopatch. Post procedure assessment: normal and unchanged  Post procedure complications: unsuccessful attempts (Two unsuccessful attempts, third was successful.). Patient tolerated the procedure well with no immediate complications.

## 2024-06-05 NOTE — Transfer of Care (Signed)
 Immediate Anesthesia Transfer of Care Note  Patient: Justin Rasmussen  Procedure(s) Performed: CRANIOTOMY TUMOR EXCISION COMPUTER-ASSISTED NAVIGATION, FOR CRANIAL PROCEDURE  Patient Location: PACU and ICU  Anesthesia Type:General  Level of Consciousness: sedated  Airway & Oxygen Therapy: Patient remains intubated per anesthesia plan  Post-op Assessment: Report given to RN and Post -op Vital signs reviewed and stable  Post vital signs: Reviewed and stable  Last Vitals:  Vitals Value Taken Time  BP    Temp    Pulse    Resp 22 06/05/24 14:55  SpO2      Last Pain:  Vitals:   06/05/24 0945  TempSrc: Oral  PainSc:       Patients Stated Pain Goal: 0 (06/04/24 2000)  Complications: No notable events documented.

## 2024-06-05 NOTE — Progress Notes (Addendum)
 STROKE TEAM PROGRESS NOTE    SIGNIFICANT HOSPITAL EVENTS 9/27: Patient presented with right-sided weakness and aphasia, found to have left frontal ICH. 9/28: MRI reveals hemorrhagic tumor in the anterior left frontal lobe, neurosurgery consulted 9/30: Craniotomy for tumor resection  INTERIM HISTORY/SUBJECTIVE Patient resting in bed with his wife at bedside. Patient is tachypneic to 35-40 respirations per minute. His nurse noted this rapid breathing began when he was on FaceTime with his daughter, likely related to frustration with being unable to express himself.  His wife mentioned that she did not notice his increased respiratory rate so she does not think this been going on for long.  Patient was then transported to OR for craniotomy, no other concerns at this time.  OBJECTIVE  CBC    Component Value Date/Time   WBC 13.8 (H) 06/05/2024 0514   RBC 4.45 06/05/2024 0514   HGB 11.8 (L) 06/05/2024 0514   HCT 38.2 (L) 06/05/2024 0514   PLT 326 06/05/2024 0514   MCV 85.8 06/05/2024 0514   MCH 26.5 06/05/2024 0514   MCHC 30.9 06/05/2024 0514   RDW 16.0 (H) 06/05/2024 0514   LYMPHSABS 0.9 06/02/2024 1815   MONOABS 0.6 06/02/2024 1815   EOSABS 0.1 06/02/2024 1815   BASOSABS 0.0 06/02/2024 1815    BMET    Component Value Date/Time   NA 155 (H) 06/05/2024 0514   NA 141 11/10/2021 1022   K 4.2 06/05/2024 0514   CL 120 (H) 06/05/2024 0514   CO2 26 06/05/2024 0514   GLUCOSE 146 (H) 06/05/2024 0514   BUN 34 (H) 06/05/2024 0514   BUN 18 11/10/2021 1022   CREATININE 1.35 (H) 06/05/2024 0514   CALCIUM  8.4 (L) 06/05/2024 0514   EGFR 76 11/10/2021 1022   GFRNONAA 58 (L) 06/05/2024 0514    IMAGING past 24 hours No results found.  Vitals:   06/05/24 0700 06/05/24 0800 06/05/24 0900 06/05/24 0945  BP: (!) 134/54 (!) 151/73 (!) 151/70 (!) 158/80  Pulse: 75 65 67 70  Resp: (!) 29 (!) 32 (!) 32 (!) 21  Temp:  98.2 F (36.8 C)  98.6 F (37 C)  TempSrc:  Oral  Oral  SpO2: 94% 94%  95% 95%  Weight:    (!) 137 kg  Height:    5' 7 (1.702 m)   PHYSICAL EXAM General:  Alert, obese patient in no acute distress CV: Regular rate and rhythm on monitor Respiratory: Tachypneic respirations on 4L O2  NEURO: Patient is alert, responds to name and is intermittently able to follow commands  Speech/Language: no verbal output  Cranial Nerves:  II: PERRL.  Blinking to visual threat bilaterally III, IV, VI: Eyelids elevate symmetrically, tracks examiner bilaterally, disconjugate R side gaze  VII: Mild R facial droop VIII: hearing intact to voice. IX, X: Palate elevates symmetrically. Phonation is normal.  XII: tongue is midline without fasciculations. Motor: RUE drift to bed less than 10 seconds, good antigravity strength to all other extremities, left > right Tone: is normal and bulk is normal Sensation-unable to assess Coordination: FTN unable to perform Gait- deferred  Most Recent NIH 7   ASSESSMENT/PLAN  [name] is a 68 y.o. male with history of melanoma on immunotherapy admitted for right-sided weakness and aphasia. He was found to have a left frontal ICH on CT, and MRI revealed a hemorrhagic tumor. He has been seen by neurosurgery and started on Decadron  for edema with operative intervention planned for this week. Discussed CODE STATUS with patient's  wife, who is in agreement with intubation for only a limited period of time. She states patient would not wish to be on life support for a prolonged period. NIH on Admission 8   ICH: left frontal ICH, etiology: Hemorrhagic mass, likely from metastatic melanoma Code Stroke CT head hemorrhage in the anterior left frontal lobe with possible underlying mass and marked surrounding vasogenic edema with 9 mm of midline shift CTA head & neck no LVO, hemodynamically significant stenosis or aneurysm MRI hemorrhagic tumor of the anterior left frontal lobe with 5 cm enhancing tumor with larger surrounding acute hemorrhage,  moderate volume of the intraventricular blood, 12 mm midline shift Follow-up CT 9/28: Stable left frontal lobe hemorrhagic mass measuring up to 6 cm with associated vasogenic edema and midline shift of up to 11 mm. 2D Echo EF 60-65%, mildly dilated left ventricular internal cavity and mildly reduced right ventricular systolic function. LDL 93 HgbA1c 5.5 VTE prophylaxis - SCDs aspirin  81 mg daily prior to admission, now on no antithrombotic due to ICH Therapy recommendations: Pending Disposition: Pending  Cerebral edema (brain compression) Cerebral edema seen on head CT and MRI with 12 mm of midline shift Decadron  initiated by neurosurgery for edema due to tumor 3% saline at 50 cc/h -> NS@ 50 NS at 38mL/hr Goal sodium 150-155 Na 145->143->150->151->154->155   Hemorrhagic tumor, likely metastatic melanoma Patient has history of melanoma of right foot, resected last year with positive lymph nodes, currently on immunotherapy Hemorrhagic tumor seen on MRI On keppra - continue Neurosurgery consulted - stereotactic craniotomy today   Hypertension Home meds: Carvedilol  6.25 mg twice daily Stable Hold off coreg  due to bradycardia BP goal less than 160 Long-term BP goal normotensive   Hyperlipidemia Home meds: Rosuvastatin  10 mg daily LDL 93, goal < 70 Resume statin at discharge and consider increase to 20 mg   Other Stroke Risk Factors Obesity, Body mass index is 47.68 kg/m., BMI >/= 30 associated with increased stroke risk, recommend weight loss, diet and exercise as appropriate  Congestive heart failure OSA   Other Active Problems Pulmonary hypertension AKI, creatinine 1.26-1.43-1.28-1.35 Anxiety - tachypnea and tachycardia today  Hospital day # 3    ATTENDING NOTE: I reviewed above note and agree with the assessment and plan. Pt was seen and examined.   Wife at bedside.  Patient eyes open, awake alert, neuro unchanged.  However tachypnea around, per wife, patient had  normal breathing this morning but after FaceTime with his grandchildren, he became anxious and frustrated about aphasia and then developed tachypnea, which family contributed to anxiety and frustration.  Plan for craniotomy today with Dr. Lanis for hematoma evacuation and tumor resection.  Sodium 155, change 3% saline to normal saline at 50 cc/h.  For detailed assessment and plan, please refer to above as I have made changes wherever appropriate.   This patient is critically ill due to left ICH, small SAH and IVH, left frontal metastatic melanoma, cerebral edema, AKI and at significant risk of neurological worsening, death form brain herniation, hydrocephalus, further hemorrhage from tumor, renal failure and respite failure. This patient's care requires constant monitoring of vital signs, hemodynamics, respiratory and cardiac monitoring, review of multiple databases, neurological assessment, discussion with family, other specialists and medical decision making of high complexity. I spent 30 minutes of neurocritical care time in the care of this patient. I had long discussion with wife at bedside, updated pt current condition, treatment plan and potential prognosis, and answered all the questions.  She expressed  understanding and appreciation.   Ary Cummins, MD PhD Stroke Neurology 06/05/2024 5:12 PM   To contact Stroke Continuity provider, please refer to [email or web address]. After hours, contact General Neurology

## 2024-06-05 NOTE — Procedures (Signed)
 Bronchoscopy Procedure Note  Justin Rasmussen  [id number]  04/11/1956  Date:06/05/24  Time:4:45 PM   Provider Performing:Masie Bermingham   Procedure(s):  Transbronchial needle aspiration (68370)  Indication(s) Chest x-ray shows right lower lobe atelectasis with possible evaluation was done obstruction  Consent Risks of the procedure as well as the alternatives and risks of each were explained to the patient and/or caregiver.  Consent for the procedure was obtained and is signed in the bedside chart  Informed consent is taken from his wife  Anesthesia He is already on propofol  and fentanyl    Time Out Verified patient identification, verified procedure, site/side was marked, verified correct patient position, special equipment/implants available, medications/allergies/relevant history reviewed, required imaging and test results available.   Sterile Technique Usual hand hygiene, masks, gowns, and gloves were used   Procedure Description Bronchoscope advanced through endotracheal tube and into airway.  Airways were examined down to subsegmental level with findings noted below.   Following diagnostic evaluation, quick endobronchial evaluation is done in the trachea and right bronchial tree.  Findings: Patient has a clear base in the trachea and right mainstem, bronchus intermedius right middle lobe.  There is small mucous plug in the superior segment of the right lower lobe that is aspirated..  Left bronchial tree is not evaluated.   Complications/Tolerance None; patient tolerated the procedure well. Chest X-ray is not needed post procedure.   EBL Minimal   Specimen(s) No specimens taken

## 2024-06-05 NOTE — Anesthesia Postprocedure Evaluation (Signed)
 Anesthesia Post Note  Patient: Justin Rasmussen  Procedure(s) Performed: CRANIOTOMY TUMOR EXCISION COMPUTER-ASSISTED NAVIGATION, FOR CRANIAL PROCEDURE     Patient location during evaluation: SICU Anesthesia Type: General Level of consciousness: sedated Pain management: pain level controlled Vital Signs Assessment: post-procedure vital signs reviewed and stable Respiratory status: patient remains intubated per anesthesia plan Cardiovascular status: stable Postop Assessment: no apparent nausea or vomiting Anesthetic complications: no   No notable events documented.  Last Vitals:  Vitals:   06/05/24 1455 06/05/24 1600  BP:  124/73  Pulse:  65  Resp: (!) 22 (!) 24  Temp:  (!) 36.4 C  SpO2:  95%    Last Pain:  Vitals:   06/05/24 1600  TempSrc: Axillary  PainSc:                  Justin Rasmussen

## 2024-06-05 NOTE — Plan of Care (Addendum)
 Seen and examined patient after returning from work.  Patient is back intubated. Chest x-ray shows chronic elevation of right diaphragm with worsening opacity at the right base.  Will do a quick bedside Koska P. MRI of the brain is pending. Will keep him sedated with vent and propofol  at night. Will do an SAT SBT in the morning.  Divine Hansley A Derisha Funderburke,MD CCM

## 2024-06-05 NOTE — Plan of Care (Signed)
 No neuro changes overnight. Maintained NPO status after midnight.    Problem: Education: Goal: Knowledge of disease or condition will improve Outcome: Progressing   Problem: Intracerebral Hemorrhage Tissue Perfusion: Goal: Complications of Intracerebral Hemorrhage will be minimized Outcome: Progressing   Problem: Health Behavior/Discharge Planning: Goal: Goals will be collaboratively established with patient/family Outcome: Progressing   Problem: Self-Care: Goal: Ability to participate in self-care as condition permits will improve Outcome: Progressing   Problem: Nutrition: Goal: Risk of aspiration will decrease Outcome: Progressing Goal: Dietary intake will improve Outcome: Progressing   Problem: Education: Goal: Knowledge of General Education information will improve Description: Including pain rating scale, medication(s)/side effects and non-pharmacologic comfort measures Outcome: Progressing   Problem: Clinical Measurements: Goal: Will remain free from infection Outcome: Progressing Goal: Diagnostic test results will improve Outcome: Progressing Goal: Respiratory complications will improve Outcome: Progressing   Problem: Nutrition: Goal: Adequate nutrition will be maintained Outcome: Progressing   Problem: Elimination: Goal: Will not experience complications related to urinary retention Outcome: Progressing   Problem: Safety: Goal: Ability to remain free from injury will improve Outcome: Progressing   Problem: Nutritional: Goal: Maintenance of adequate nutrition will improve Outcome: Progressing   Problem: Skin Integrity: Goal: Risk for impaired skin integrity will decrease Outcome: Progressing

## 2024-06-05 NOTE — Anesthesia Preprocedure Evaluation (Addendum)
 Anesthesia Evaluation  Patient identified by MRN, date of birth, ID band Patient confused    Reviewed: Allergy & Precautions, NPO status , Patient's Chart, lab work & pertinent test results  History of Anesthesia Complications Negative for: history of anesthetic complications  Airway Mallampati: II  TM Distance: >3 FB Neck ROM: Full    Dental  (+) Edentulous Upper, Edentulous Lower   Pulmonary sleep apnea and Continuous Positive Airway Pressure Ventilation , COPD,  COPD inhaler, former smoker    + decreased breath sounds      Cardiovascular +CHF  Normal cardiovascular exam   '25 TTE - EF 60 to 65%. The left ventricular internal cavity size was mildly dilated. Reduced PAT suggessive of mild RV dysfunction. Right ventricular systolic function is mildly reduced. The right ventricular size is mildly enlarged. Trivial TR     Neuro/Psych  Left ICH Small SAH and IVH Left frontal metastatic melanoma Cerebral edema   Neuromuscular disease  negative psych ROS   GI/Hepatic negative GI ROS, Neg liver ROS,,,  Endo/Other    Class 3 obesity  Renal/GU negative Renal ROS     Musculoskeletal negative musculoskeletal ROS (+)    Abdominal   Peds  Hematology negative hematology ROS (+)   Anesthesia Other Findings On hypertonic saline with goal Na 150-155  Reproductive/Obstetrics                              Anesthesia Physical Anesthesia Plan  ASA: 3  Anesthesia Plan: General   Post-op Pain Management: Ofirmev  IV (intra-op)*   Induction: Intravenous  PONV Risk Score and Plan: 2 and Treatment may vary due to age or medical condition, Ondansetron , Dexamethasone  and TIVA  Airway Management Planned: Oral ETT  Additional Equipment: Arterial line  Intra-op Plan:   Post-operative Plan: Possible Post-op intubation/ventilation  Informed Consent: I have reviewed the patients History and Physical,  chart, labs and discussed the procedure including the risks, benefits and alternatives for the proposed anesthesia with the patient or authorized representative who has indicated his/her understanding and acceptance.     Dental advisory given and Consent reviewed with POA  Plan Discussed with: CRNA and Anesthesiologist  Anesthesia Plan Comments:          Anesthesia Quick Evaluation

## 2024-06-05 NOTE — Progress Notes (Signed)
 NAMEDona Klemann, MRN:  [id number], DOB:  Dec 21, 1955, LOS: 3 ADMISSION DATE:  06/02/2024, CONSULTATION DATE:  9/27 REFERRING MD:  Michaela, neuro CHIEF COMPLAINT: ICH   History of Present Illness:  68 year old male with past medical history of pulmonary hypertension, hyperlipidemia, OSA on CPAP at night, HFrEF, COPD, melanoma currently on nivolumab  presented to the ED this evening with right-sided weakness and aphasia. LKW around 9 AM.  Wife in the emergency department reported that around noon he seemed tired and not speaking much.  Also noted to be a little confused.  On arrival to ED, noted to have right-sided weakness, aphasia and code stroke was activated.  CT head with left frontal lobe 4 x 6 x 4 cm hemorrhage with concern for underlying mass, vasogenic edema and 9 mm midline shift.  CT angio with no LVO.  Labs with hemoglobin 12.3, sodium 140, creatinine 1.26, ethanol negative, glucose normal.  He was evaluated by neurology who admitted him to ICU.  CCM with cross coverage consult.  Pertinent  Medical History  pulmonary hypertension, hyperlipidemia, OSA on CPAP at night, HFrEF, COPD, melanoma currently on nivolumab   Significant Hospital Events: Including procedures, antibiotic start and stop dates in addition to other pertinent events   9/27: Admit to ICU s/p code stroke with left frontal lobe ICH and concern for mass  Interim History / Subjective:  Evaluated at bedside.  Wife states that patient started to talk little bit.  He could not do much to me.  Able to move all 4 limbs.  Sodium is at 155 and patient is hyperchloremic.  Has had some urine output.  Plans for surgery today noted.  Objective   Blood pressure (!) 134/54, pulse 75, temperature 98.2 F (36.8 C), temperature source Axillary, resp. rate (!) 29, height 5' 7 (1.702 m), weight (!) 137.3 kg, SpO2 94%.        Intake/Output Summary (Last 24 hours) at 06/05/2024 0742 Last data filed at 06/05/2024 0700 Gross per 24  hour  Intake 1185.13 ml  Output 575 ml  Net 610.13 ml   Filed Weights   06/02/24 2015 06/05/24 0500  Weight: (!) 138.1 kg (!) 137.3 kg    Examination: General: Elderly male, not in acute distress, obese  Lungs: Clear to auscultation bilaterally Cardiovascular: Giller rate and rhythm, normal S1, normal S2 Abdomen: Obese, soft, nontender Extremities: No edema, warm Neuro: Awake, 4 out of 5 strength and right upper and lower extremity.  Aphasic GU: No Foley  Resolved Hospital Problem list    Assessment & Plan:  Acute left frontal lobe ICH Vasogenic edema and 12 mm midline shift Single 2.5 cm enhancing tumor with surrounding hemorrhage.  No evidence of other metastasis Last known well 9 AM 06/02/2024.  Right-sided weakness, right facial droop, Broca's aphasia.  History of melanoma on treatment currently and imaging concerning for hemorrhagic mass. - Stroke primary, appreciate management - No antiplatelets or anticoagulants - SBP goal < 150, prn cardene gtt ordered to use as needed - frequent neuro checks  - stat imaging for change in mental status - f/u MRI brain showing 2.5 cm enhancing tumor with surrounding hemorrhage with 12 mm midline shift and 83 cc total of blood increased from prior imaging. - PT/OT/SLP - Appreciate neurology and neurosurgery input plans for tumor resection and hematoma evacuation tomorrow - Sodium goal is 150-155 patient is on continuous hypertonic saline.  -Asked RN to hold hypertonic saline infusion.  We will reevaluate sodium levels postoperatively. - On Decadron   HFrEF Hypertension Hyperlipidemia  On torsemide  20 daily, entresto  bid, crestor  10mg  daily  -at home - goal SBP < 150  - coreg  6.25mg  BID and Home dose Entresto  are held. - prn cardene gtt for goals  - Lipid panel reviewed LDL is 93 HDL is 28.  Restart statin when clinically appropriate  - Echo on this admission shows EF of 60 to 65% no wall motion abnormalities and no significant wall  or heart disease   COPD  OSA on CPAP at night  Pulmonary hypertension, previously on home O2, now prn  On albuterol  prn - CPAP ordered nightly  - O2 prn for sat >90%  - prn duoneb   Diabetes? On Farxiga  at home  A1c is 5.5. - ssi  - cbg q4h   Spoke with the wife and updated her.  GOC: discussed by primary with wife and made DNR-Limited   Labs   CBC: Recent Labs  Lab 06/02/24 1815 06/02/24 1818 06/03/24 0551 06/04/24 0606 06/05/24 0514  WBC 8.7  --  10.4 13.9* 13.8*  NEUTROABS 7.0  --   --   --   --   HGB 12.3* 12.9* 12.7* 11.8* 11.8*  HCT 39.9 38.0* 40.0 37.6* 38.2*  MCV 83.5  --  83.5 84.7 85.8  PLT 342  --  322 289 326    Basic Metabolic Panel: Recent Labs  Lab 06/02/24 1815 06/02/24 1818 06/03/24 0551 06/03/24 1111 06/04/24 0606 06/04/24 1037 06/04/24 1545 06/04/24 2353 06/05/24 0514  NA 140 141 141   < > 150* 150* 151* 154* 155*  K 3.8 3.8 3.8  --  3.9  --   --   --  4.2  CL 99 100 103  --  111  --   --   --  120*  CO2 27  --  26  --  28  --   --   --  26  GLUCOSE 127* 130* 168*  --  151*  --   --   --  146*  BUN 16 17 20   --  28*  --   --   --  34*  CREATININE 1.26* 1.30* 1.43*  --  1.28*  --   --   --  1.35*  CALCIUM  8.9  --  8.7*  --  8.6*  --   --   --  8.4*   < > = values in this interval not displayed.   GFR: Estimated Creatinine Clearance: 72 mL/min (A) (by C-G formula based on SCr of 1.35 mg/dL (H)). Recent Labs  Lab 06/02/24 1815 06/03/24 0551 06/04/24 0606 06/05/24 0514  WBC 8.7 10.4 13.9* 13.8*    Liver Function Tests: Recent Labs  Lab 06/02/24 1815  AST 21  ALT 13  ALKPHOS 46  BILITOT 0.7  PROT 7.1  ALBUMIN 3.5   No results for input(s): LIPASE, AMYLASE in the last 168 hours. No results for input(s): AMMONIA in the last 168 hours.  ABG    Component Value Date/Time   PHART 7.322 (L) 06/05/2019 0753   PCO2ART 73.6 (HH) 06/05/2019 0753   PO2ART 98.0 06/05/2019 0753   HCO3 32.4 (H) 06/03/2024 0551   TCO2 29  06/02/2024 1818   O2SAT 66.8 06/03/2024 0551     Coagulation Profile: Recent Labs  Lab 06/02/24 1815  INR 1.2    Cardiac Enzymes: No results for input(s): CKTOTAL, CKMB, CKMBINDEX, TROPONINI in the last 168 hours.  HbA1C: Hgb A1c MFr Bld  Date/Time Value Ref Range  Status  06/02/2024 10:35 PM 5.5 4.8 - 5.6 % Final    Comment:    (NOTE) Diagnosis of Diabetes The following HbA1c ranges recommended by the American Diabetes Association (ADA) may be used as an aid in the diagnosis of diabetes mellitus.  Hemoglobin             Suggested A1C NGSP%              Diagnosis  <5.7                   Non Diabetic  5.7-6.4                Pre-Diabetic  >6.4                   Diabetic  <7.0                   Glycemic control for                       adults with diabetes.    11/10/2021 10:22 AM 5.9 (H) 4.8 - 5.6 % Final    Comment:             Prediabetes: 5.7 - 6.4          Diabetes: >6.4          Glycemic control for adults with diabetes: <7.0     CBG: Recent Labs  Lab 06/04/24 1136 06/04/24 1557 06/04/24 1911 06/04/24 2321 06/05/24 0301  GLUCAP 153* 139* 163* 155* 149*    Review of Systems:   As above  Past Medical History:  He,  has a past medical history of 2019 novel coronavirus disease (COVID-19) (07/2019), COPD (chronic obstructive pulmonary disease) (HCC), HFrEF (heart failure with reduced ejection fraction) (HCC), Melanoma (HCC), Nonischemic cardiomyopathy (HCC), OSA (obstructive sleep apnea), and Pulmonary hypertension (HCC).   Surgical History:   Past Surgical History:  Procedure Laterality Date   ADENOIDECTOMY     RIGHT/LEFT HEART CATH AND CORONARY ANGIOGRAPHY N/A 06/05/2019   Procedure: RIGHT/LEFT HEART CATH AND CORONARY ANGIOGRAPHY;  Surgeon: Dann Candyce RAMAN, MD;  Location[facility] INVASIVE CV LAB;  Service: Cardiovascular;  Laterality: N/A;     Social History:   reports that he quit smoking about 35 years ago. His smoking use included  cigarettes. He started smoking about 50 years ago. He has a 15 pack-year smoking history. He has never used smokeless tobacco. He reports that he does not currently use alcohol. He reports that he does not use drugs.   Family History:  His family history includes Diabetes in his father and sister; Heart attack in his father; Heart disease in his sister; Kidney disease in his mother.   Allergies No Known Allergies   Home Medications  Prior to Admission medications   Medication Sig Start Date End Date Taking? Authorizing Provider  acetaminophen  (TYLENOL ) 500 MG tablet Take 1,000 mg by mouth.   Yes [provider]  albuterol  (VENTOLIN  HFA) 108 (90 Base) MCG/ACT inhaler Inhale 2 puffs into the lungs every 6 (six) hours as needed for wheezing or shortness of breath. 02/10/24  Yes Pauline Garnette LABOR, MD  aspirin  81 MG chewable tablet Chew 1 tablet (81 mg total) by mouth daily. 06/08/19  Yes Rosario Leatrice FERNS, MD  carvedilol  (COREG ) 6.25 MG tablet Take 1 tablet (6.25 mg total) by mouth 2 (two) times daily with a meal. Patient must schedule annual appointment for further refills  03/28/24  Yes Croitoru, Mihai, MD  cetirizine (ZYRTEC) 10 MG tablet Take 10 mg by mouth daily.   Yes [provider]  FARXIGA  10 MG TABS tablet Take 1 tablet (10 mg total) by mouth daily before breakfast. Patient must schedule annual appointment for further refills 03/28/24  Yes Croitoru, Mihai, MD  Multiple Vitamin (MULTIVITAMIN WITH MINERALS) TABS tablet Take 1 tablet by mouth daily.   Yes [provider]  rosuvastatin  (CRESTOR ) 10 MG tablet Take 1 tablet (10 mg total) by mouth daily. Patient must schedule annual appointment for further refills 03/28/24  Yes Croitoru, Mihai, MD  sacubitril -valsartan  (ENTRESTO ) 97-103 MG Take 1 tablet by mouth twice daily 09/21/23  Yes Croitoru, Mihai, MD  torsemide  (DEMADEX ) 20 MG tablet Take 1 tablet (20 mg total) by mouth as needed (Swelling). Patient taking differently:  Take 20 mg by mouth daily. 11/10/23  Yes Croitoru, Mihai, MD  amoxicillin -clavulanate (AUGMENTIN ) 875-125 MG tablet Take one tab PO Q12hr with food Patient not taking: Reported on 06/02/2024 02/10/24   Pauline Garnette LABOR, MD  benzonatate  (TESSALON ) 100 MG capsule Take 1 capsule (100 mg total) by mouth 3 (three) times daily as needed. Patient not taking: Reported on 06/02/2024 01/20/24   Kennyth Domino, FNP  fluticasone  (FLONASE ) 50 MCG/ACT nasal spray Place 2 sprays into both nostrils daily. Patient not taking: Reported on 07/12/2023 11/19/19   Gretta Leita SQUIBB, DO  montelukast  (SINGULAIR ) 10 MG tablet Take one tab PO QHS Patient not taking: Reported on 06/02/2024 02/10/24   Pauline Garnette LABOR, MD    35 minutes of critical care time is spent taking care of this patient excluding any procedures.   Tamela Stakes, MD  Attending Physician, Critical Care Medicine [facility] See Amion for pager If no response to pager, please call [phone] until 7pm After 7pm, Please call E-link [phone]

## 2024-06-06 ENCOUNTER — Inpatient Hospital Stay (HOSPITAL_COMMUNITY)

## 2024-06-06 ENCOUNTER — Encounter (HOSPITAL_COMMUNITY): Payer: Self-pay | Admitting: Neurosurgery

## 2024-06-06 DIAGNOSIS — I611 Nontraumatic intracerebral hemorrhage in hemisphere, cortical: Secondary | ICD-10-CM | POA: Diagnosis not present

## 2024-06-06 DIAGNOSIS — R1310 Dysphagia, unspecified: Secondary | ICD-10-CM

## 2024-06-06 DIAGNOSIS — I615 Nontraumatic intracerebral hemorrhage, intraventricular: Secondary | ICD-10-CM | POA: Diagnosis not present

## 2024-06-06 DIAGNOSIS — R569 Unspecified convulsions: Secondary | ICD-10-CM

## 2024-06-06 DIAGNOSIS — Z87891 Personal history of nicotine dependence: Secondary | ICD-10-CM

## 2024-06-06 DIAGNOSIS — I609 Nontraumatic subarachnoid hemorrhage, unspecified: Secondary | ICD-10-CM | POA: Diagnosis not present

## 2024-06-06 DIAGNOSIS — R29707 NIHSS score 7: Secondary | ICD-10-CM | POA: Diagnosis not present

## 2024-06-06 LAB — TRIGLYCERIDES: Triglycerides: 151 mg/dL — ABNORMAL HIGH (ref <150)

## 2024-06-06 LAB — POCT I-STAT 7, (LYTES, BLD GAS, ICA,H+H)
Acid-Base Excess: 1 mmol/L (ref 0.0–2.0)
Acid-Base Excess: 2 mmol/L (ref 0.0–2.0)
Bicarbonate: 26.5 mmol/L (ref 20.0–28.0)
Bicarbonate: 31.3 mmol/L — ABNORMAL HIGH (ref 20.0–28.0)
Calcium, Ion: 1.2 mmol/L (ref 1.15–1.40)
Calcium, Ion: 1.21 mmol/L (ref 1.15–1.40)
HCT: 30 % — ABNORMAL LOW (ref 39.0–52.0)
HCT: 35 % — ABNORMAL LOW (ref 39.0–52.0)
Hemoglobin: 10.2 g/dL — ABNORMAL LOW (ref 13.0–17.0)
Hemoglobin: 11.9 g/dL — ABNORMAL LOW (ref 13.0–17.0)
O2 Saturation: 100 %
O2 Saturation: 100 %
Patient temperature: 36.2
Patient temperature: 36.3
Potassium: 4 mmol/L (ref 3.5–5.1)
Potassium: 4.5 mmol/L (ref 3.5–5.1)
Sodium: 159 mmol/L — ABNORMAL HIGH (ref 135–145)
Sodium: 158 mmol/L — ABNORMAL HIGH (ref 135–145)
TCO2: 28 mmol/L (ref 22–32)
TCO2: 34 mmol/L — ABNORMAL HIGH (ref 22–32)
pCO2 arterial: 42.8 mmHg (ref 32–48)
pCO2 arterial: 72 mmHg (ref 32–48)
pH, Arterial: 7.396 (ref 7.35–7.45)
pH, Arterial: 7.243 — ABNORMAL LOW (ref 7.35–7.45)
pO2, Arterial: 208 mmHg — ABNORMAL HIGH (ref 83–108)
pO2, Arterial: 252 mmHg — ABNORMAL HIGH (ref 83–108)

## 2024-06-06 LAB — BASIC METABOLIC PANEL WITH GFR
Anion gap: 11 (ref 5–15)
BUN: 37 mg/dL — ABNORMAL HIGH (ref 8–23)
CO2: 23 mmol/L (ref 22–32)
Calcium: 8.2 mg/dL — ABNORMAL LOW (ref 8.9–10.3)
Chloride: 121 mmol/L — ABNORMAL HIGH (ref 98–111)
Creatinine, Ser: 1.31 mg/dL — ABNORMAL HIGH (ref 0.61–1.24)
GFR, Estimated: >60 mL/min (ref >60)
Glucose, Bld: 144 mg/dL — ABNORMAL HIGH (ref 70–99)
Potassium: 3.9 mmol/L (ref 3.5–5.1)
Sodium: 155 mmol/L — ABNORMAL HIGH (ref 135–145)

## 2024-06-06 LAB — CBC
HCT: 36 % — ABNORMAL LOW (ref 39.0–52.0)
Hemoglobin: 10.9 g/dL — ABNORMAL LOW (ref 13.0–17.0)
MCH: 26.5 pg (ref 26.0–34.0)
MCHC: 30.3 g/dL (ref 30.0–36.0)
MCV: 87.4 fL (ref 80.0–100.0)
Platelets: 269 K/uL (ref 150–400)
RBC: 4.12 MIL/uL — ABNORMAL LOW (ref 4.22–5.81)
RDW: 16.4 % — ABNORMAL HIGH (ref 11.5–15.5)
WBC: 10.3 K/uL (ref 4.0–10.5)
nRBC: 0 % (ref 0.0–0.2)

## 2024-06-06 LAB — GLUCOSE, CAPILLARY
Glucose-Capillary: 132 mg/dL — ABNORMAL HIGH (ref 70–99)
Glucose-Capillary: 132 mg/dL — ABNORMAL HIGH (ref 70–99)
Glucose-Capillary: 148 mg/dL — ABNORMAL HIGH (ref 70–99)
Glucose-Capillary: 135 mg/dL — ABNORMAL HIGH (ref 70–99)
Glucose-Capillary: 128 mg/dL — ABNORMAL HIGH (ref 70–99)
Glucose-Capillary: 164 mg/dL — ABNORMAL HIGH (ref 70–99)

## 2024-06-06 LAB — POCT ACTIVATED CLOTTING TIME: Activated Clotting Time: 0 s

## 2024-06-06 MED ORDER — CARVEDILOL 12.5 MG PO TABS: 25.0000 mg | ORAL_TABLET | Freq: Two times a day (BID) | ORAL | Status: DC

## 2024-06-06 MED ORDER — GADOBUTROL 1 MMOL/ML IV SOLN
10.0000 mL | Freq: Once | INTRAVENOUS | Status: AC | PRN
  Administered 2024-06-05: 10 mL via INTRAVENOUS

## 2024-06-06 MED ORDER — LABETALOL HCL 5 MG/ML IV SOLN
10.0000 mg | INTRAVENOUS | Status: DC | PRN
  Administered 2024-06-06: 10 mg via INTRAVENOUS
  Filled 2024-06-06: qty 4

## 2024-06-06 MED ORDER — HYDRALAZINE HCL 20 MG/ML IJ SOLN
10.0000 mg | INTRAMUSCULAR | Status: DC | PRN
  Administered 2024-06-06 – 2024-06-07 (×2): 10 mg via INTRAVENOUS
  Filled 2024-06-06 (×2): qty 1

## 2024-06-06 MED ORDER — CLEVIDIPINE BUTYRATE 0.5 MG/ML IV EMUL
0.0000 mg/h | INTRAVENOUS | Status: DC
  Administered 2024-06-06: 2 mg/h via INTRAVENOUS
  Administered 2024-06-06: 12 mg/h via INTRAVENOUS
  Administered 2024-06-07: 8 mg/h via INTRAVENOUS
  Administered 2024-06-07: 16 mg/h via INTRAVENOUS
  Administered 2024-06-07: 14 mg/h via INTRAVENOUS
  Administered 2024-06-07: 16 mg/h via INTRAVENOUS
  Administered 2024-06-07 – 2024-06-08 (×2): 8 mg/h via INTRAVENOUS
  Administered 2024-06-08: 10 mg/h via INTRAVENOUS
  Filled 2024-06-06 (×2): qty 100
  Filled 2024-06-06: qty 200
  Filled 2024-06-06 (×3): qty 100
  Filled 2024-06-06: qty 200

## 2024-06-06 MED ORDER — MUPIROCIN 2 % EX OINT
TOPICAL_OINTMENT | Freq: Two times a day (BID) | CUTANEOUS | Status: AC
  Administered 2024-06-07 – 2024-06-08 (×2): 1 via NASAL
  Filled 2024-06-06: qty 22

## 2024-06-06 MED ORDER — LEVETIRACETAM (KEPPRA) 500 MG/5 ML ADULT IV PUSH
1000.0000 mg | Freq: Two times a day (BID) | INTRAVENOUS | Status: DC
  Administered 2024-06-06 – 2024-06-08 (×4): 1000 mg via INTRAVENOUS
  Filled 2024-06-06 (×4): qty 10

## 2024-06-06 MED ORDER — DEXMEDETOMIDINE HCL IN NACL 400 MCG/100ML IV SOLN: 0.0000 ug/kg/h | INTRAVENOUS | Status: DC

## 2024-06-06 MED ORDER — LEVETIRACETAM (KEPPRA) 500 MG/5 ML ADULT IV PUSH
500.0000 mg | Freq: Once | INTRAVENOUS | Status: AC
  Administered 2024-06-06: 500 mg via INTRAVENOUS
  Filled 2024-06-06: qty 5

## 2024-06-06 MED ORDER — METOPROLOL TARTRATE 5 MG/5ML IV SOLN
5.0000 mg | Freq: Four times a day (QID) | INTRAVENOUS | Status: DC
  Administered 2024-06-06 – 2024-06-08 (×7): 5 mg via INTRAVENOUS
  Filled 2024-06-06 (×7): qty 5

## 2024-06-06 NOTE — Progress Notes (Signed)
  NEUROSURGERY PROGRESS NOTE   Pt seen and examined. No issues overnight. Pt remains intubated, propofol  just d/c'ed.  EXAM: Temp:  [97.5 F (36.4 C)-98.9 F (37.2 C)] 98.2 F (36.8 C) (10/01 1100) Pulse Rate:  [47-116] 93 (10/01 1215) Resp:  [0-26] 20 (10/01 1215) BP: (124-173)/(64-79) 135/70 (10/01 1200) SpO2:  [91 %-99 %] 93 % (10/01 1215) Arterial Line BP: (128-174)/(52-78) 143/67 (10/01 1215) FiO2 (%):  [40 %-100 %] 40 % (10/01 1132) Intake/Output      09/30 0701 10/01 0700 10/01 0701 10/02 0700   I.V. (mL/kg) 2154 (15.7) 408.3 (3)   IV Piggyback 180 5   Total Intake(mL/kg) 2334 (17) 413.3 (3)   Urine (mL/kg/hr) 1040 (0.3) 150 (0.2)   Blood 100    Total Output 1140 150   Net +1194 +263.3         Spontaneously opens eyes, ?R gaze preference Breathing spontaneously Purposeful LUE/LLE > Right Headwrap in place  LABS: Lab Results  Component Value Date   CREATININE 1.31 (H) 06/06/2024   BUN 37 (H) 06/06/2024   NA 155 (H) 06/06/2024   K 3.9 06/06/2024   CL 121 (H) 06/06/2024   CO2 23 06/06/2024   Lab Results  Component Value Date   WBC 10.3 06/06/2024   HGB 10.9 (L) 06/06/2024   HCT 36.0 (L) 06/06/2024   MCV 87.4 06/06/2024   PLT 269 06/06/2024    IMAGING: MRI reviewed and demonstrates improvement in mass effect with evacuation of hematoma. No further enhancing mass is identified.    IMPRESSION: - 68 y.o. male POD#1 left crani for resection of metastasis, evacuation of hematoma. Remains intubated likely due to baseline disease. - Gaze preference - ?SZ  PLAN: - D/W neurology, will likely get EEG today - Cont Keppra - Can wean dex down to 2 BID - Wean to extubated per PCCM   Gerldine Maizes, MD [facility]

## 2024-06-06 NOTE — Progress Notes (Signed)
   06/06/24 2232  BiPAP/CPAP/SIPAP  Reason BIPAP/CPAP not in use Other(comment) (Pt has cont. EEG leads on head. Pt wearing [place]. RN aware)

## 2024-06-06 NOTE — Progress Notes (Addendum)
 STROKE TEAM PROGRESS NOTE    SIGNIFICANT HOSPITAL EVENTS 9/27: Patient presented with right-sided weakness and aphasia, found to have left frontal ICH. 9/28: MRI reveals hemorrhagic tumor in the anterior left frontal lobe, neurosurgery consulted 9/30: Craniotomy for tumor resection  INTERIM HISTORY/SUBJECTIVE Exam done after sedation had been off for hours Eyes open spont. Left eye with downward/dysconjugate gaze.  Questionable lower right jaw rhythmic movements, will get EEG to evaluate further.  Tachypenic and tachycardic with continued exam, this has been common with patient getting agitated with continued stimuli.   OK from neurology standpoint for SBT and extubation of cleared by CCM.   OBJECTIVE  CBC    Component Value Date/Time   WBC 10.3 06/06/2024 0415   RBC 4.12 (L) 06/06/2024 0415   HGB 10.9 (L) 06/06/2024 0415   HCT 36.0 (L) 06/06/2024 0415   PLT 269 06/06/2024 0415   MCV 87.4 06/06/2024 0415   MCH 26.5 06/06/2024 0415   MCHC 30.3 06/06/2024 0415   RDW 16.4 (H) 06/06/2024 0415   LYMPHSABS 0.9 06/02/2024 1815   MONOABS 0.6 06/02/2024 1815   EOSABS 0.1 06/02/2024 1815   BASOSABS 0.0 06/02/2024 1815    BMET    Component Value Date/Time   NA 155 (H) 06/06/2024 0415   NA 141 11/10/2021 1022   K 3.9 06/06/2024 0415   CL 121 (H) 06/06/2024 0415   CO2 23 06/06/2024 0415   GLUCOSE 144 (H) 06/06/2024 0415   BUN 37 (H) 06/06/2024 0415   BUN 18 11/10/2021 1022   CREATININE 1.31 (H) 06/06/2024 0415   CALCIUM  8.2 (L) 06/06/2024 0415   EGFR 76 11/10/2021 1022   GFRNONAA >60 06/06/2024 0415    IMAGING past 24 hours MR BRAIN W WO CONTRAST Result Date: 06/06/2024 CLINICAL DATA:  68 year old male presenting with imaging findings of hemorrhagic brain tumor postoperative day zero on 06/02/2024, past medical history of melanoma. Status post left frontal craniotomy, tumor resection, hematoma evacuation. EXAM: MRI HEAD WITHOUT AND WITH CONTRAST TECHNIQUE: Multiplanar,  multiecho pulse sequences of the brain and surrounding structures were obtained without and with intravenous contrast. CONTRAST:  10mL GADAVIST  GADOBUTROL  1 MMOL/ML IV SOLN COMPARISON:  Preoperative brain MRI 06/03/2024. FINDINGS: Brain: Left anterior frontal craniotomy changes now. Underlying combined resection cavity and decreased volume of mixed signal intra-axial hemorrhage, hematoma. Layering hematocrit no longer visible. Cystic resection cavity now (stellate, 3-4 mm) versus previous 5-6 cm hematoma. And following contrast the rounded 2.5 cm enhancing mass is no longer visible. Some intrinsic T1 hyperintense areas along the resection cavity (series 14, image 45) likely are blood products. No suspicious intracranial enhancement identified now. Overlying postoperative pneumocephalus and 5-6 mm thick extra-axial hematoma/seroma (series 10, image 15). Based on SWI, intraventricular blood volume has not progressed. Basilar cisterns remain patent. Left lateral ventricle patency has improved. And rightward midline shift of 10-11 mm is stable. Bilateral subarachnoid hemorrhage is more apparent (series 12, image 48) but volume is not large. On DWI there is scattered resection cavity diffusion restriction versus blood related susceptibility (series 6, image 32). Other SAH and IVH related DWI susceptibility. No areas of diffusion restriction suspicious for vascular territory infarct. Negative pituitary and cervicomedullary junction. Vascular: Major intracranial vascular flow voids are stable. Following contrast major dural venous sinuses are enhancing and appear to be patent. Skull and upper cervical spine: New craniotomy.  Otherwise stable. Sinuses/Orbits: Stable; right maxillary sinus aeration has improved. Other: Intubated with mildly increased mastoid effusions. And retained secretions in the pharynx. New postoperative changes  to the scalp soft tissues. Broad-based left scalp hematoma. IMPRESSION: 1. Decreased left  frontal lobe hematoma volume, and resection of underlying round enhancing mass status post craniotomy. 2. Postoperative sequelae including resection cavity with marginal diffusion restriction which might enhance on follow-up. No new metastatic disease identified. Stable to mildly improved intracranial mass effect. Stable IVH, while bilateral SAH may have increased. Electronically Signed   By: VEAR Hurst M.D.   On: 06/06/2024 04:13   DG Abd Portable 1V Result Date: 06/05/2024 CLINICAL DATA:  OG tube placement EXAM: PORTABLE ABDOMEN - 1 VIEW COMPARISON:  None Available. FINDINGS: Enteric tube tip overlies the distal stomach. Upper gas pattern is nonobstructed IMPRESSION: Enteric tube tip overlies the distal stomach. Electronically Signed   By: Luke Bun M.D.   On: 06/05/2024 15:47   DG CHEST PORT 1 VIEW Result Date: 06/05/2024 CLINICAL DATA:  OG tube placement EXAM: PORTABLE CHEST 1 VIEW COMPARISON:  06/02/2024, 11/16/2023, 06/01/2019, brain MRI 06/03/2024 FINDINGS: Interval intubation, tip of the endotracheal tube is about 2.3 cm superior to the carina. Enteric tube tip below the diaphragm but incompletely assessed.chronic elevation of the right diaphragm. Worsening opacity at the right base. Increased left basilar opacity. Possible occlusion of right bronchus. Some volume loss and shift to the right. IMPRESSION: 1. Interval intubation, tip of the endotracheal tube is about 2.3 cm superior to the carina. Enteric tube tip below the diaphragm but incompletely assessed. 2. Chronic elevation of right diaphragm but with worsening opacity at the right base with increased left basilar opacity, atelectasis versus pneumonia versus aspiration. Possible occlusion/abrupt cut off of right bronchus. Consider correlation with contrast-enhanced chest CT Electronically Signed   By: Luke Bun M.D.   On: 06/05/2024 15:47    Vitals:   06/06/24 0600 06/06/24 0700 06/06/24 0759 06/06/24 0800  BP: (!) 145/68 (!) 152/65 (!)  173/70   Pulse: (!) 51 (!) 47 (!) 48   Resp: (!) 24 (!) 24 (!) 24   Temp:    98.6 F (37 C)  TempSrc:    Axillary  SpO2: 95% 95% 94%   Weight:      Height:       PHYSICAL EXAM General:  Alert, obese patient in no acute distress CV: Regular rate and rhythm on monitor, tachycardic with continued stimuli. Respiratory: Tachypneic. PS on ventilator  NEURO:  Eyes open spontaneously patient is responsive to voice.  Does not consistently follow simple commands.  Cranial Nerves:  II: PERRL.  Blinking to visual threat bilaterally III, IV, VI: Eyelids elevate symmetrically, disconjugate R side gaze.  Left eye with disconjugate/downward gaze.  CN VI palsy VII: ET tube in place VIII: hearing intact to voice. IX, X: Cough and gag reflex positive XII: ET tube in place Motor/sensory: Spontaneous movement of left arm seen, lifts off bed Withdrawal movement seen in all other extremities, less on right. Tone: is normal and bulk is normal Sensation-unable to assess Coordination: FTN unable to perform Gait- deferred  Most Recent NIH 7   ASSESSMENT/PLAN  Mr. Ladarren Steiner is a 68 y.o. male with history of melanoma on immunotherapy admitted for right-sided weakness and aphasia. He was found to have a left frontal ICH on CT, and MRI revealed a hemorrhagic tumor. He has been seen by neurosurgery and started on Decadron  for edema with operative intervention planned for this week. Discussed CODE STATUS with patient's wife, who is in agreement with intubation for only a limited period of time. She states patient would not wish to  be on life support for a prolonged period. NIH on Admission 8   ICH: left frontal ICH, etiology: Hemorrhagic mass, likely from metastatic melanoma Code Stroke CT head hemorrhage in the anterior left frontal lobe with possible underlying mass and marked surrounding vasogenic edema with 9 mm of midline shift CTA head & neck no LVO, hemodynamically significant stenosis or  aneurysm MRI hemorrhagic tumor of the anterior left frontal lobe with 5 cm enhancing tumor with larger surrounding acute hemorrhage, moderate volume of the intraventricular blood, 12 mm midline shift Follow-up CT 9/28: Stable left frontal lobe hemorrhagic mass measuring up to 6 cm with associated vasogenic edema and midline shift of up to 11 mm. S/p hematoma evacuation 9/30 CT head pending in am 2D Echo EF 60-65%, mildly dilated left ventricular internal cavity and mildly reduced right ventricular systolic function. LDL 93 HgbA1c 5.5 VTE prophylaxis - SCDs aspirin  81 mg daily prior to admission, continue no antithrombotic due to ICH Therapy recommendations: Pending Disposition: Pending  Cerebral edema (brain compression) Cerebral edema seen on head CT and MRI with 12 mm of midline shift Decadron  initiated by neurosurgery for edema due to tumor 3% saline at 50 cc/h -> NS@ 50  Goal sodium, normalize on the high end of normal range Q12H Na for today. If normalizing, can change to Q24h Na 145->143->150->151->154->155->157->156->155   Hemorrhagic tumor, likely metastatic melanoma Patient has history of melanoma of right foot, resected last year with positive lymph nodes, currently on immunotherapy Hemorrhagic tumor seen on MRI On keppra increased to 1g bid Pending EEG today due to rhythmic lower jaw movement seen on exam today. Neurosurgery consulted S/p stereotactic craniotomy 9/30 Post-op MRI shows decreased hematoma volume with no new metastatic disease identified.  Stable to mildly improved mass effect, stable IVH.  Possible increase of bilateral SAH noted on read.   Hypertension CHFrEF Home meds: Carvedilol  6.25 mg twice daily, Entresto  twice daily torsemide  20 mg as needed Stable On IV metoprolol BP goal less than 160 Long-term BP goal normotensive   Hyperlipidemia Home meds: Rosuvastatin  10 mg daily LDL 93, goal < 70 Resume statin at discharge and consider increase to 20  mg  Tobacco use Former cigarette smoker Quit in 1990   Dysphagia Currently n.p.o. post surgery IV fluid Speech on board  Other Stroke Risk Factors Obesity, Body mass index is 47.68 kg/m., BMI >/= 30 associated with increased stroke risk, recommend weight loss, diet and exercise as appropriate  OSA   Other Active Problems Pulmonary hypertension CKD 3a, creatinine 1.26-1.43-1.28-1.35-1.31  Hospital day # 4   Pt seen by Neuro NP/APP with MD. Note/plan to be edited by MD as needed.    Rocky JAYSON Likes, DNP, AGACNP-BC [facility] Please use AMION for contact information & EPIC for messaging.  ATTENDING NOTE: I reviewed above note and agree with the assessment and plan. Pt was seen and examined.   Wife at bedside.  Patient still intubated postsurgery, right eye CN VI nerve palsy not able to Abduct, left eye right gaze preference, able to cross midline on request but primarily on right gaze position.  Also noted jawing shivering, EEG done showed moderate to severe diffuse encephalopathy, no seizure.  However put on long-term EEG for further evaluation.  Sodium 155, on normal saline.  Gradually normalize sodium level.  Overnight MRI showed stable mass effect, repeat CT in a.m.  Continue Decadron  per neurosurgery.  For detailed assessment and plan, please refer to above as I have made changes wherever appropriate.  Ary Cummins, MD PhD Stroke Neurology 06/06/2024 5:50 PM  This patient is critically ill due to left ICH, small SAH and IVH, left frontal metastatic melanoma, cerebral edema, AKI and at significant risk of neurological worsening, death form brain herniation, hydrocephalus, further hemorrhage from tumor, renal failure and respite failure. This patient's care requires constant monitoring of vital signs, hemodynamics, respiratory and cardiac monitoring, review of multiple databases, neurological assessment, discussion with family, other specialists and medical decision  making of high complexity. I spent 35 minutes of neurocritical care time in the care of this patient. I had long discussion with wife at bedside, updated pt current condition, treatment plan and potential prognosis, and answered all the questions.  She expressed understanding and appreciation.  I discussed with Dr. Lanis.  To contact Stroke Continuity provider, please refer to [email or web address]. After hours, contact General Neurology

## 2024-06-06 NOTE — Progress Notes (Signed)
 SLP Cancellation Note  Patient Details Name: Sanav Remer MRN: [id number] DOB: September 13, 1955   Cancelled treatment:       Reason Eval/Treat Not Completed: Medical issues which prohibited therapy (pt intubated s/p craniotomy). SLP will f/u as able.    Damien Blumenthal, M.A., CCC-SLP Speech Language Pathology, Acute Rehabilitation Services  Secure Chat preferred [phone]  06/06/2024, 9:09 AM

## 2024-06-06 NOTE — Procedures (Signed)
 Extubation Procedure Note  Patient Details:   Name: Justin Rasmussen DOB: 1955-10-29 MRN: [id number]   Airway Documentation:    Vent end date: 06/06/24 Vent end time: 1318   Evaluation  O2 sats: stable throughout Complications: No apparent complications Patient did tolerate procedure well. Bilateral Breath Sounds: Clear, Diminished   No  RT extubated patient per order and placed on 2L . RN at bedside.  [name] 06/06/2024, 1:27 PM

## 2024-06-06 NOTE — Procedures (Signed)
 Patient Name: Justin Rasmussen  MRN: [id number]  Epilepsy Attending: Arlin MALVA Krebs  Referring Physician/Provider: Judithe Rocky BROCKS, NP  Date: 06/06/2024 Duration: 22.51 mins  Patient history: 68yo M with lower right jaw rhythmic movements. EEG to evaluate for seizure   Level of alertness: Awake/ lethargic   AEDs during EEG study: LEV  Technical aspects: This EEG study was done with scalp electrodes positioned according to the 10-20 International system of electrode placement. Electrical activity was reviewed with band pass filter of 1-70Hz , sensitivity of 7 uV/mm, display speed of 65mm/sec with a 60Hz  notched filter applied as appropriate. EEG data were recorded continuously and digitally stored.  Video monitoring was available and reviewed as appropriate.  Description: EEG showed continuous generalized and maximal bilateral frontal polymorphic sharply contoured 3 to 6 Hz theta-delta slowing. Hyperventilation and photic stimulation were not performed.     ABNORMALITY - Continuous slow, generalized  IMPRESSION: This study is suggestive of moderate to severe diffuse encephalopathy. No seizures or definite epileptiform discharges were seen throughout the recording.  If suspicion for interictal activity remains a concern, a prolonged study should be considered.   Justin Rasmussen

## 2024-06-06 NOTE — Progress Notes (Signed)
 EEG complete - results pending LTM to follow Routine.

## 2024-06-06 NOTE — Progress Notes (Signed)
 Transported patient to MRI while patient was on the ventilator. Patient remained stable during transport.

## 2024-06-06 NOTE — Progress Notes (Signed)
 LTM EEG hooked up and running - no initial skin breakdown - push button tested - [facility] monitoring.CT leads used

## 2024-06-06 NOTE — Progress Notes (Signed)
 OT Cancellation Note  Patient Details Name: Justin Rasmussen MRN: [id number] DOB: 07-22-1956   Cancelled Treatment:    Reason Eval/Treat Not Completed: Patient not medically ready (Sedation down, now waiting for extubation. OT to wait for post-extubation evaluation orders.)  [name] 06/06/2024, 8:48 AM

## 2024-06-06 NOTE — Progress Notes (Signed)
 NAMEQuayshaun Rasmussen, MRN:  [id number], DOB:  07-20-1956, LOS: 4 ADMISSION DATE:  06/02/2024, CONSULTATION DATE:  9/27 REFERRING MD:  Michaela, neuro CHIEF COMPLAINT: ICH   History of Present Illness:  68 year old male with past medical history of pulmonary hypertension, hyperlipidemia, OSA on CPAP at night, HFrEF, COPD, melanoma currently on nivolumab  presented to the ED this evening with right-sided weakness and aphasia. LKW around 9 AM.  Wife in the emergency department reported that around noon he seemed tired and not speaking much.  Also noted to be a little confused.  On arrival to ED, noted to have right-sided weakness, aphasia and code stroke was activated.  CT head with left frontal lobe 4 x 6 x 4 cm hemorrhage with concern for underlying mass, vasogenic edema and 9 mm midline shift.  CT angio with no LVO.  Labs with hemoglobin 12.3, sodium 140, creatinine 1.26, ethanol negative, glucose normal.  He was evaluated by neurology who admitted him to ICU.  CCM with cross coverage consult.  Pertinent  Medical History  pulmonary hypertension, hyperlipidemia, OSA on CPAP at night, HFrEF, COPD, melanoma currently on nivolumab   Significant Hospital Events: Including procedures, antibiotic start and stop dates in addition to other pertinent events   9/27: Admit to ICU s/p code stroke with left frontal lobe ICH and concern for mass 9/30: Craniotomy with tumor resection and hematoma evacuation  Interim History / Subjective:  Patient is postop today.  He is sedated since surgery.  We will wean him off sedation and try to SBT.  If he passes we will hopefully extubate him.  Urine output was a little low overnight but picking up this morning.  Objective   Blood pressure 130/67, pulse 96, temperature 98.2 F (36.8 C), temperature source Axillary, resp. rate (!) 21, height 5' 7 (1.702 m), weight (!) 137 kg, SpO2 92%.    Vent Mode: PRVC FiO2 (%):  [40 %-100 %] 40 % Set Rate:  [22 bmp-24 bmp] 24  bmp Vt Set:  [520 mL] 520 mL PEEP:  [5 cmH20] 5 cmH20 Plateau Pressure:  [17 cmH20] 17 cmH20   Intake/Output Summary (Last 24 hours) at 06/06/2024 1157 Last data filed at 06/06/2024 1100 Gross per 24 hour  Intake 2640.3 ml  Output 1090 ml  Net 1550.3 ml   Filed Weights   06/02/24 2015 06/05/24 0500 06/05/24 0945  Weight: (!) 138.1 kg (!) 137.3 kg (!) 137 kg    Examination: General: Elderly male, not in acute distress, obese sedated and intubated HEENT: Intubated postop dressing on the head Lungs: Clear to auscultation bilaterally ETT in place Cardiovascular: Giller rate and rhythm, normal S1, normal S2 Abdomen: Obese, soft, nontender Extremities: No edema, warm Neuro: Sleeping but wakes up not following commands yet sedation will be held this morning  Resolved Hospital Problem list    Assessment & Plan:  Acute left frontal lobe ICH Vasogenic edema and 12 mm midline shift Single 2.5 cm enhancing tumor with surrounding hemorrhage.  No evidence of other metastasis S/p craniotomy with hematoma evacuation on 06/05/2024 Last known well 9 AM 06/02/2024.  Right-sided weakness, right facial droop, Broca's aphasia.  History of melanoma on treatment currently and imaging concerning for hemorrhagic mass. - Stroke primary, appreciate management - No antiplatelets or anticoagulants - SBP goal < 150, prn cardene gtt ordered to use as needed - frequent neuro checks  - stat imaging for change in mental status - MRI brain 9/28 - showing 2.5 cm enhancing tumor with surrounding hemorrhage  with 12 mm midline shift and 83 cc total of blood increased from prior imaging. -MRI brain postop ON 9/30 -decreased left frontal lobe hematoma postcraniotomy resection of the underlying mass no new metastatic disease. - PT/OT/SLP - Appreciate neurology and neurosurgery input plans for tumor resection and hematoma evacuation tomorrow - Sodium goal is 140-145 now that he has gotten tumor resection and  craniotomy -Stop hypertonic saline - On Decadron  -discussed with neurosurgery recommended to keep it on he will likely need a long taper. - Stop sedation this morning -propofol  and fentanyl  are stopped -Use Precedex if needed for agitation   COPD  OSA on CPAP at night  Pulmonary hypertension, previously on home O2, now prn  Acute hypoxic respiratory failure Right lower lobe atelectasis  - Patient came in intubated postoperatively on 930 -Remained intubated overnight for MRI and SAT SBT -Status post bronchoscopy on 9/30 of right bronchial tree showed no obstruction no mucous plugging On albuterol  prn - CPAP ordered nightly  - O2 prn for sat >90%  - prn duoneb   HFrEF Hypertension Hyperlipidemia  On torsemide  20 daily, entresto  bid, crestor  10mg  daily  -at home - goal SBP < 150  - coreg  6.25mg  BID and Home dose Entresto  are held. - prn cardene gtt for goals SBP less than 150 - Lipid panel reviewed LDL is 93 HDL is 28.  Restart statin when clinically appropriate  - Echo on this admission shows EF of 60 to 65% no wall motion abnormalities and no significant wall or heart disease   Diabetes? On Farxiga  at home  A1c is 5.5. - ssi  - cbg q4h   Spoke with the wife and updated her.  GOC: CODE STATUS is changed from DNR limited to full code perioperatively.  Labs   CBC: Recent Labs  Lab 06/02/24 1815 06/02/24 1818 06/03/24 0551 06/04/24 0606 06/05/24 0514 06/05/24 1511 06/06/24 0415  WBC 8.7  --  10.4 13.9* 13.8*  --  10.3  NEUTROABS 7.0  --   --   --   --   --   --   HGB 12.3*   < > 12.7* 11.8* 11.8* 10.5* 10.9*  HCT 39.9   < > 40.0 37.6* 38.2* 31.0* 36.0*  MCV 83.5  --  83.5 84.7 85.8  --  87.4  PLT 342  --  322 289 326  --  269   < > = values in this interval not displayed.    Basic Metabolic Panel: Recent Labs  Lab 06/02/24 1815 06/02/24 1818 06/03/24 0551 06/03/24 1111 06/04/24 0606 06/04/24 1037 06/04/24 2353 06/05/24 0514 06/05/24 1511  06/05/24 1536 06/06/24 0415  NA 140 141 141   < > 150*   < > 154* 155* 157* 156* 155*  K 3.8 3.8 3.8  --  3.9  --   --  4.2 4.1  --  3.9  CL 99 100 103  --  111  --   --  120*  --   --  121*  CO2 27  --  26  --  28  --   --  26  --   --  23  GLUCOSE 127* 130* 168*  --  151*  --   --  146*  --   --  144*  BUN 16 17 20   --  28*  --   --  34*  --   --  37*  CREATININE 1.26* 1.30* 1.43*  --  1.28*  --   --  1.35*  --   --  1.31*  CALCIUM  8.9  --  8.7*  --  8.6*  --   --  8.4*  --   --  8.2*   < > = values in this interval not displayed.   GFR: Estimated Creatinine Clearance: 74.1 mL/min (A) (by C-G formula based on SCr of 1.31 mg/dL (H)). Recent Labs  Lab 06/03/24 0551 06/04/24 0606 06/05/24 0514 06/06/24 0415  WBC 10.4 13.9* 13.8* 10.3    Liver Function Tests: Recent Labs  Lab 06/02/24 1815  AST 21  ALT 13  ALKPHOS 46  BILITOT 0.7  PROT 7.1  ALBUMIN 3.5   No results for input(s): LIPASE, AMYLASE in the last 168 hours. No results for input(s): AMMONIA in the last 168 hours.  ABG    Component Value Date/Time   PHART 7.327 (L) 06/05/2024 1511   PCO2ART 53.5 (H) 06/05/2024 1511   PO2ART 256 (H) 06/05/2024 1511   HCO3 28.0 06/05/2024 1511   TCO2 30 06/05/2024 1511   O2SAT 100 06/05/2024 1511     Coagulation Profile: Recent Labs  Lab 06/02/24 1815  INR 1.2    Cardiac Enzymes: No results for input(s): CKTOTAL, CKMB, CKMBINDEX, TROPONINI in the last 168 hours.  HbA1C: Hgb A1c MFr Bld  Date/Time Value Ref Range Status  06/02/2024 10:35 PM 5.5 4.8 - 5.6 % Final    Comment:    (NOTE) Diagnosis of Diabetes The following HbA1c ranges recommended by the American Diabetes Association (ADA) may be used as an aid in the diagnosis of diabetes mellitus.  Hemoglobin             Suggested A1C NGSP%              Diagnosis  <5.7                   Non Diabetic  5.7-6.4                Pre-Diabetic  >6.4                   Diabetic  <7.0                    Glycemic control for                       adults with diabetes.    11/10/2021 10:22 AM 5.9 (H) 4.8 - 5.6 % Final    Comment:             Prediabetes: 5.7 - 6.4          Diabetes: >6.4          Glycemic control for adults with diabetes: <7.0     CBG: Recent Labs  Lab 06/05/24 1912 06/05/24 2306 06/06/24 0317 06/06/24 0757 06/06/24 1131  GLUCAP 138* 142* 132* 132* 148*    Review of Systems:   As above  Past Medical History:  He,  has a past medical history of 2019 novel coronavirus disease (COVID-19) (07/2019), COPD (chronic obstructive pulmonary disease) (HCC), HFrEF (heart failure with reduced ejection fraction) (HCC), Melanoma (HCC), Nonischemic cardiomyopathy (HCC), OSA (obstructive sleep apnea), and Pulmonary hypertension (HCC).   Surgical History:   Past Surgical History:  Procedure Laterality Date   ADENOIDECTOMY     APPLICATION OF CRANIAL NAVIGATION N/A 06/05/2024   Procedure: COMPUTER-ASSISTED NAVIGATION, FOR CRANIAL PROCEDURE;  Surgeon: Lanis Pupa, MD;  Location: [facility] OR;  Service: Neurosurgery;  Laterality: N/A;   CRANIOTOMY N/A 06/05/2024   Procedure: CRANIOTOMY TUMOR EXCISION;  Surgeon: Lanis Pupa, MD;  Location: [facility] OR;  Service: Neurosurgery;  Laterality: N/A;  STEREOTACTIC CRANIOTOMY FOR TUMOR   RIGHT/LEFT HEART CATH AND CORONARY ANGIOGRAPHY N/A 06/05/2019   Procedure: RIGHT/LEFT HEART CATH AND CORONARY ANGIOGRAPHY;  Surgeon: Dann Candyce RAMAN, MD;  Location: [facility] INVASIVE CV LAB;  Service: Cardiovascular;  Laterality: N/A;     Social History:   reports that he quit smoking about 35 years ago. His smoking use included cigarettes. He started smoking about 50 years ago. He has a 15 pack-year smoking history. He has never used smokeless tobacco. He reports that he does not currently use alcohol. He reports that he does not use drugs.   Family History:  His family history includes Diabetes in his father and sister; Heart attack in his father; Heart  disease in his sister; Kidney disease in his mother.   Allergies No Known Allergies   Home Medications  Prior to Admission medications   Medication Sig Start Date End Date Taking? Authorizing Provider  acetaminophen  (TYLENOL ) 500 MG tablet Take 1,000 mg by mouth.   Yes [provider]  albuterol  (VENTOLIN  HFA) 108 (90 Base) MCG/ACT inhaler Inhale 2 puffs into the lungs every 6 (six) hours as needed for wheezing or shortness of breath. 02/10/24  Yes Pauline Garnette LABOR, MD  aspirin  81 MG chewable tablet Chew 1 tablet (81 mg total) by mouth daily. 06/08/19  Yes Rosario Leatrice FERNS, MD  carvedilol  (COREG ) 6.25 MG tablet Take 1 tablet (6.25 mg total) by mouth 2 (two) times daily with a meal. Patient must schedule annual appointment for further refills 03/28/24  Yes Croitoru, Mihai, MD  cetirizine (ZYRTEC) 10 MG tablet Take 10 mg by mouth daily.   Yes [provider]  FARXIGA  10 MG TABS tablet Take 1 tablet (10 mg total) by mouth daily before breakfast. Patient must schedule annual appointment for further refills 03/28/24  Yes Croitoru, Mihai, MD  Multiple Vitamin (MULTIVITAMIN WITH MINERALS) TABS tablet Take 1 tablet by mouth daily.   Yes [provider]  rosuvastatin  (CRESTOR ) 10 MG tablet Take 1 tablet (10 mg total) by mouth daily. Patient must schedule annual appointment for further refills 03/28/24  Yes Croitoru, Mihai, MD  sacubitril -valsartan  (ENTRESTO ) 97-103 MG Take 1 tablet by mouth twice daily 09/21/23  Yes Croitoru, Mihai, MD  torsemide  (DEMADEX ) 20 MG tablet Take 1 tablet (20 mg total) by mouth as needed (Swelling). Patient taking differently: Take 20 mg by mouth daily. 11/10/23  Yes Croitoru, Mihai, MD  amoxicillin -clavulanate (AUGMENTIN ) 875-125 MG tablet Take one tab PO Q12hr with food Patient not taking: Reported on 06/02/2024 02/10/24   Pauline Garnette LABOR, MD  benzonatate  (TESSALON ) 100 MG capsule Take 1 capsule (100 mg total) by mouth 3 (three) times daily as  needed. Patient not taking: Reported on 06/02/2024 01/20/24   Kennyth Domino, FNP  fluticasone  (FLONASE ) 50 MCG/ACT nasal spray Place 2 sprays into both nostrils daily. Patient not taking: Reported on 07/12/2023 11/19/19   Gretta Doffing P, DO  montelukast  (SINGULAIR ) 10 MG tablet Take one tab PO QHS Patient not taking: Reported on 06/02/2024 02/10/24   Pauline Garnette LABOR, MD    35 minutes of critical care time is spent taking care of this patient excluding any procedures.   Tamela Stakes, MD  Attending Physician, Critical Care Medicine [facility] See Amion for pager If no response to pager, please call [phone] until 7pm  After 7pm, Please call E-link [phone]

## 2024-06-07 ENCOUNTER — Inpatient Hospital Stay (HOSPITAL_COMMUNITY)

## 2024-06-07 DIAGNOSIS — I615 Nontraumatic intracerebral hemorrhage, intraventricular: Secondary | ICD-10-CM | POA: Diagnosis not present

## 2024-06-07 DIAGNOSIS — I609 Nontraumatic subarachnoid hemorrhage, unspecified: Secondary | ICD-10-CM | POA: Diagnosis not present

## 2024-06-07 DIAGNOSIS — R220 Localized swelling, mass and lump, head: Secondary | ICD-10-CM | POA: Diagnosis not present

## 2024-06-07 DIAGNOSIS — R569 Unspecified convulsions: Secondary | ICD-10-CM | POA: Diagnosis not present

## 2024-06-07 DIAGNOSIS — I611 Nontraumatic intracerebral hemorrhage in hemisphere, cortical: Secondary | ICD-10-CM | POA: Diagnosis not present

## 2024-06-07 DIAGNOSIS — R29707 NIHSS score 7: Secondary | ICD-10-CM | POA: Diagnosis not present

## 2024-06-07 LAB — CBC
HCT: 41.5 % (ref 39.0–52.0)
Hemoglobin: 12.3 g/dL — ABNORMAL LOW (ref 13.0–17.0)
MCH: 26 pg (ref 26.0–34.0)
MCHC: 29.6 g/dL — ABNORMAL LOW (ref 30.0–36.0)
MCV: 87.7 fL (ref 80.0–100.0)
Platelets: 267 K/uL (ref 150–400)
RBC: 4.73 MIL/uL (ref 4.22–5.81)
RDW: 16.4 % — ABNORMAL HIGH (ref 11.5–15.5)
WBC: 10.6 K/uL — ABNORMAL HIGH (ref 4.0–10.5)
nRBC: 0 % (ref 0.0–0.2)

## 2024-06-07 LAB — BASIC METABOLIC PANEL WITH GFR
Anion gap: 10 (ref 5–15)
BUN: 35 mg/dL — ABNORMAL HIGH (ref 8–23)
CO2: 24 mmol/L (ref 22–32)
Calcium: 8.4 mg/dL — ABNORMAL LOW (ref 8.9–10.3)
Chloride: 123 mmol/L — ABNORMAL HIGH (ref 98–111)
Creatinine, Ser: 1.14 mg/dL (ref 0.61–1.24)
GFR, Estimated: >60 mL/min (ref >60)
Glucose, Bld: 150 mg/dL — ABNORMAL HIGH (ref 70–99)
Potassium: 3.9 mmol/L (ref 3.5–5.1)
Sodium: 157 mmol/L — ABNORMAL HIGH (ref 135–145)

## 2024-06-07 LAB — GLUCOSE, CAPILLARY
Glucose-Capillary: 135 mg/dL — ABNORMAL HIGH (ref 70–99)
Glucose-Capillary: 143 mg/dL — ABNORMAL HIGH (ref 70–99)
Glucose-Capillary: 163 mg/dL — ABNORMAL HIGH (ref 70–99)
Glucose-Capillary: 156 mg/dL — ABNORMAL HIGH (ref 70–99)
Glucose-Capillary: 149 mg/dL — ABNORMAL HIGH (ref 70–99)
Glucose-Capillary: 157 mg/dL — ABNORMAL HIGH (ref 70–99)

## 2024-06-07 LAB — SODIUM
Sodium: 156 mmol/L — ABNORMAL HIGH (ref 135–145)
Sodium: 157 mmol/L — ABNORMAL HIGH (ref 135–145)

## 2024-06-07 LAB — MAGNESIUM: Magnesium: 2.9 mg/dL — ABNORMAL HIGH (ref 1.7–2.4)

## 2024-06-07 LAB — SURGICAL PATHOLOGY

## 2024-06-07 LAB — PHOSPHORUS: Phosphorus: 3 mg/dL (ref 2.5–4.6)

## 2024-06-07 MED ORDER — HYDRALAZINE HCL 20 MG/ML IJ SOLN
20.0000 mg | INTRAMUSCULAR | Status: DC | PRN
  Administered 2024-06-07 – 2024-06-08 (×2): 20 mg via INTRAVENOUS
  Filled 2024-06-07: qty 1

## 2024-06-07 MED ORDER — LABETALOL HCL 5 MG/ML IV SOLN
20.0000 mg | INTRAVENOUS | Status: DC | PRN
  Administered 2024-06-08: 20 mg via INTRAVENOUS
  Filled 2024-06-07: qty 4

## 2024-06-07 MED ORDER — ORAL CARE MOUTH RINSE
15.0000 mL | Freq: Four times a day (QID) | OROMUCOSAL | Status: DC
  Administered 2024-06-07 – 2024-06-22 (×55): 15 mL via OROMUCOSAL

## 2024-06-07 MED ORDER — DEXTROSE-SODIUM CHLORIDE 5-0.45 % IV SOLN: INTRAVENOUS | Status: DC

## 2024-06-07 MED ORDER — SODIUM CHLORIDE 0.45 % IV SOLN: INTRAVENOUS | Status: DC

## 2024-06-07 NOTE — Procedures (Signed)
 Patient Name: Justin Rasmussen  MRN: [id number]  Epilepsy Attending: Arlin MALVA Krebs  Referring Physician/Provider: Jerri Pfeiffer, MD   Duration: 06/06/2024 1316 to 06/07/2024 1120   Patient history: 68yo M with lower right jaw rhythmic movements. EEG to evaluate for seizure    Level of alertness: Awake/ lethargic    AEDs during EEG study: LEV   Technical aspects: This EEG study was done with scalp electrodes positioned according to the 10-20 International system of electrode placement. Electrical activity was reviewed with band pass filter of 1-70Hz , sensitivity of 7 uV/mm, display speed of 31mm/sec with a 60Hz  notched filter applied as appropriate. EEG data were recorded continuously and digitally stored.  Video monitoring was available and reviewed as appropriate.   Description: EEG showed continuous generalized and maximal bilateral frontal polymorphic sharply contoured 3 to 6 Hz theta-delta slowing. Hyperventilation and photic stimulation were not performed.      ABNORMALITY - Continuous slow, generalized   IMPRESSION: This study is suggestive of moderate to severe diffuse encephalopathy. No seizures or definite epileptiform discharges were seen throughout the recording.   Shirell Struthers O Kaela Beitz

## 2024-06-07 NOTE — Progress Notes (Signed)
   Inpatient Rehab Admissions Coordinator :  Per therapy recommendations patient was screened for CIR candidacy by Ottie Glazier RN MSN. Patient is not yet at a level to tolerate the intensity required to pursue a CIR admit . The CIR admissions team will follow and monitor for progress and place a Rehab Consult order if felt to be appropriate. Please contact me with any questions.  Ottie Glazier RN MSN Admissions Coordinator [phone]

## 2024-06-07 NOTE — Progress Notes (Signed)
   06/07/24 2042  BiPAP/CPAP/SIPAP  BiPAP/CPAP/SIPAP Pt Type Adult  BiPAP/CPAP/SIPAP Resmed  Reason BIPAP/CPAP not in use Other(comment) (CPAP held due to sutures/bandage on head)  Patient Home Machine Yes  Safety Check Completed by RT for Home Unit Yes, no issues noted  Patient Home Mask Yes  Patient Home Tubing Yes  Device Plugged into RED Power Outlet Yes

## 2024-06-07 NOTE — Progress Notes (Signed)
 LTM EEG discontinued - no skin breakdown at [facility].

## 2024-06-07 NOTE — TOC CAGE-AID Note (Signed)
 Transition of Care [facility]) - CAGE-AID Screening   Patient Details  Name: Cejay Cambre MRN: [id number] Date of Birth: 02-23-1956  Transition of Care [facility]) CM/SW Contact:    Inocente GORMAN Kindle, LCSW Phone Number: 06/07/2024, 9:59 AM   Clinical Narrative: Patient disoriented and unable to participate in screening.    CAGE-AID Screening: Substance Abuse Screening unable to be completed due to: : Patient unable to participate

## 2024-06-07 NOTE — Progress Notes (Signed)
 Orthopedic Tech Progress Note Patient Details:  Justin Rasmussen [birth year] [id number]  Ortho Devices Type of Ortho Device: Prafo boot/shoe Ortho Device/Splint Location: BLE Ortho Device/Splint Interventions: Application, Ordered   Post Interventions Patient Tolerated: Well  [name] A Fernando Torry 06/07/2024, 2:48 PM

## 2024-06-07 NOTE — Progress Notes (Signed)
 Speech Language Pathology Treatment: Dysphagia;Cognitive-Linguistic  Patient Details Name: Justin Rasmussen MRN: [id number] DOB: 1956-01-04 Today's Date: 06/07/2024 Time: 1220-1236 SLP Time Calculation (min) (ACUTE ONLY): 16 min  Assessment / Plan / Recommendation Clinical Impression  Initiating phonation continues to be challenging for the pt, which is similar to initial evaluation 9/28 though he now exhibits increased difficulty with yes/no questions and command following. Noted R gaze preference and difficulty attending to that side, inconsistently following commands with his LUE given Max cueing. Pt's spouse reports his dentures are present but are not currently donned and denies difficulty with regular diet prior to craniotomy. He needed total assist to accept POs today, demonstrating reduced oral cohesion and swallowing multiple times to clear small boluses. His voice became increasingly wet and he was unable to follow commands to clear his throat or cough, though one delayed cough was observed. Purees appeared to result in improved oral control, clearing the bolus without needing to swallow multiple times. Given mentation, language deficits, and increased WOB noted today, recommend he remain NPO except ice chips after oral care and meds crushed in puree (could consider short-term AMN when able). Education was provided to pt's spouse and discussed with Charity fundraiser. Will continue following.    HPI HPI: 68 y.o. male history of melanoma currently in the process of getting immunotherapy presented 06/02/24 with altered mental status, aphasia, and Rt sided weakness. Initial CT scan showing a left frontal hemorrhage. MRI showed underlying mass. S/p L frontal crani for tumor resection and evacuation of L frontal hematoma & transbronchial needle aspiration 9/30. Extubated 10/1. Questionable seizure activity noted during hospitalization. PMH-COPD, HF, cardiomyopathy, pulmonary HTN, OSA, melanoma on immunotherapy       SLP Plan  Continue with current plan of care          Recommendations  Diet recommendations: NPO Medication Administration: Crushed with puree                  Oral care QID;Oral care prior to ice chip/H20   Frequent or constant Supervision/Assistance Aphasia (R47.01);Cognitive communication deficit (R41.841);Dysphagia, unspecified (R13.10)     Continue with current plan of care     Damien Blumenthal, M.A., CCC-SLP Speech Language Pathology, Acute Rehabilitation Services  Secure Chat preferred [phone]   06/07/2024, 1:04 PM

## 2024-06-07 NOTE — Progress Notes (Signed)
  NEUROSURGERY PROGRESS NOTE   Pt seen and examined. No issues overnight. Extubated yesterday.   EXAM: Temp:  [98.2 F (36.8 C)-99.3 F (37.4 C)] 99.3 F (37.4 C) (10/02 0400) Pulse Rate:  [48-116] 73 (10/02 0615) Resp:  [12-48] 21 (10/02 0615) BP: (128-175)/(64-91) 153/78 (10/02 0600) SpO2:  [90 %-99 %] 96 % (10/02 0615) Arterial Line BP: (132-178)/(57-78) 158/61 (10/02 0615) FiO2 (%):  [40 %] 40 % (10/01 1132) Weight:  [140.6 kg] 140.6 kg (10/02 0500) Intake/Output      10/01 0701 10/02 0700 10/02 0701 10/03 0700   I.V. (mL/kg) 1962.2 (14)    IV Piggyback 15    Total Intake(mL/kg) 1977.2 (14.1)    Urine (mL/kg/hr) 1700 (0.5)    Blood     Total Output 1700    Net +277.2           Awake, alert Follows commands Not speaking today Follows commands LUE/LLE Minimal movement RUE/RLE  LABS: Lab Results  Component Value Date   CREATININE 1.14 06/07/2024   BUN 35 (H) 06/07/2024   NA 157 (H) 06/07/2024   K 3.9 06/07/2024   CL 123 (H) 06/07/2024   CO2 24 06/07/2024   Lab Results  Component Value Date   WBC 10.6 (H) 06/07/2024   HGB 12.3 (L) 06/07/2024   HCT 41.5 06/07/2024   MCV 87.7 06/07/2024   PLT 267 06/07/2024   IMAGING: CTH last night reviewed, no significant change from postop MRI.  IMPRESSION: - 68 y.o. male POD#3 left frontal crani for resection of met, evac of hematoma, progressing slowly.  PLAN: - Cont supportive care - Cont PT/OT/SLP. Suspect he will need CIR v SNF post-acute care - Cont steroids, will need longer-term dex upon d/c (2mg  BID) - Cont Keppra   Gerldine Maizes, MD [facility]

## 2024-06-07 NOTE — Progress Notes (Signed)
 NAMEMahin Rasmussen, MRN:  [id number], DOB:  April 25, 1956, LOS: 5 ADMISSION DATE:  06/02/2024, CONSULTATION DATE:  9/27 REFERRING MD:  Michaela, neuro CHIEF COMPLAINT: ICH   History of Present Illness:  68 year old male with past medical history of pulmonary hypertension, hyperlipidemia, OSA on CPAP at night, HFrEF, COPD, melanoma currently on nivolumab  presented to the ED this evening with right-sided weakness and aphasia. LKW around 9 AM.  Wife in the emergency department reported that around noon he seemed tired and not speaking much.  Also noted to be a little confused.  On arrival to ED, noted to have right-sided weakness, aphasia and code stroke was activated.  CT head with left frontal lobe 4 x 6 x 4 cm hemorrhage with concern for underlying mass, vasogenic edema and 9 mm midline shift.  CT angio with no LVO.  Labs with hemoglobin 12.3, sodium 140, creatinine 1.26, ethanol negative, glucose normal.  He was evaluated by neurology who admitted him to ICU.  CCM with cross coverage consult.  Pertinent  Medical History  pulmonary hypertension, hyperlipidemia, OSA on CPAP at night, HFrEF, COPD, melanoma currently on nivolumab   Significant Hospital Events: Including procedures, antibiotic start and stop dates in addition to other pertinent events   9/27: Admit to ICU s/p code stroke with left frontal lobe ICH and concern for mass 9/30: Craniotomy with tumor resection and hematoma evacuation 10/1: Extubated 10/2: Unable to swallow plans for [place] tomorrow  Interim History / Subjective:  Patient remains confused.  He is alert but does not follow commands.  He remains on nasal cannula.  He failed swallow study and will be getting a feeding tube tomorrow.  Sodium is high likely due to free water deficit normal saline is changed to half-normal saline.  Patient remains on Cleviprex as he is not able to swallow oral pills.  Objective   Blood pressure (!) 152/93, pulse 90, temperature 99.5 F (37.5  C), temperature source Axillary, resp. rate (!) 23, height 5' 7 (1.702 m), weight (!) 140.6 kg, SpO2 95%.        Intake/Output Summary (Last 24 hours) at 06/07/2024 1209 Last data filed at 06/07/2024 0800 Gross per 24 hour  Intake 1554.42 ml  Output 1350 ml  Net 204.42 ml   Filed Weights   06/05/24 0500 06/05/24 0945 06/07/24 0500  Weight: (!) 137.3 kg (!) 137 kg (!) 140.6 kg    Examination: General: Elderly male, not in acute distress obese HEENT: Intubated postop dressing on the head Lungs: Clear to auscultation bilaterally ETT in place Cardiovascular: Giller rate and rhythm, normal S1, normal S2 Abdomen: Obese, soft, nontender Extremities: No edema, warm Neuro: Sleeping but wakes up not following commands yet sedation will be held this morning  Resolved Hospital Problem list    Assessment & Plan:  Acute left frontal lobe ICH Vasogenic edema and 12 mm midline shift Single 2.5 cm enhancing tumor with surrounding hemorrhage.  No evidence of other metastasis S/p craniotomy with hematoma evacuation on 06/05/2024 Last known well 9 AM 06/02/2024.  Right-sided weakness, right facial droop, Broca's aphasia.  History of melanoma on treatment currently and imaging concerning for hemorrhagic mass. - Stroke primary, appreciate management - No antiplatelets or anticoagulants - SBP goal < 150, prn cardene gtt ordered to use as needed - frequent neuro checks  - stat imaging for change in mental status - MRI brain 9/28 - showing 2.5 cm enhancing tumor with surrounding hemorrhage with 12 mm midline shift and 83 cc total of  blood increased from prior imaging. -MRI brain postop ON 9/30 -decreased left frontal lobe hematoma postcraniotomy resection of the underlying mass no new metastatic disease. - PT/OT/SLP - Appreciate neurology and neurosurgery input  - Sodium goal is 140-145 now that he has gotten tumor resection and craniotomy - On Decadron  -discussed with neurosurgery recommended to  keep it on he will likely need a long taper. - Patient failed swallow study will get a feeding tube tomorrow.  Interim we will start him on half-normal saline as he is getting hypernatremic due to free water deficit.   COPD  OSA on CPAP at night  Pulmonary hypertension, previously on home O2, now prn  Acute hypoxic respiratory failure Right lower lobe atelectasis  - Patient came in intubated postoperatively on 930 -Remained intubated overnight for MRI and SAT SBT -Status post bronchoscopy on 9/30 of right bronchial tree showed no obstruction no mucous plugging On albuterol  prn - CPAP ordered nightly  - O2 prn for sat >90%  - prn duoneb   HFrEF Hypertension Hyperlipidemia  On torsemide  20 daily, entresto  bid, crestor  10mg  daily  -at home - goal SBP < 150  - coreg  6.25mg  BID and Home dose Entresto  are held. - prn cardene gtt for goals SBP less than 150 - Lipid panel reviewed LDL is 93 HDL is 28.  Restart statin when clinically appropriate  - Echo on this admission shows EF of 60 to 65% no wall motion abnormalities and no significant wall or heart disease - Patient remains on Cleviprex with PRNs.  Once he is able to get a feeding tube we  will restart his home meds including Entresto  and Coreg .   Diabetes? On Farxiga  at home  A1c is 5.5. - ssi  - cbg q4h   Spoke with the wife and updated her.  GOC: CODE STATUS is changed from DNR limited to full code perioperatively.  Labs   CBC: Recent Labs  Lab 06/02/24 1815 06/02/24 1818 06/03/24 0551 06/04/24 0606 06/05/24 0514 06/05/24 1154 06/05/24 1427 06/05/24 1511 06/06/24 0415 06/07/24 0514  WBC 8.7  --  10.4 13.9* 13.8*  --   --   --  10.3 10.6*  NEUTROABS 7.0  --   --   --   --   --   --   --   --   --   HGB 12.3*   < > 12.7* 11.8* 11.8* 10.2* 11.9* 10.5* 10.9* 12.3*  HCT 39.9   < > 40.0 37.6* 38.2* 30.0* 35.0* 31.0* 36.0* 41.5  MCV 83.5  --  83.5 84.7 85.8  --   --   --  87.4 87.7  PLT 342  --  322 289 326  --   --    --  269 267   < > = values in this interval not displayed.    Basic Metabolic Panel: Recent Labs  Lab 06/03/24 0551 06/03/24 1111 06/04/24 0606 06/04/24 1037 06/05/24 0514 06/05/24 1154 06/05/24 1427 06/05/24 1511 06/05/24 1536 06/06/24 0415 06/06/24 2308 06/07/24 0514  NA 141   < > 150*   < > 155* 159* 158* 157* 156* 155* 156* 157*  K 3.8  --  3.9  --  4.2 4.0 4.5 4.1  --  3.9  --  3.9  CL 103  --  111  --  120*  --   --   --   --  121*  --  123*  CO2 26  --  28  --  26  --   --   --   --  23  --  24  GLUCOSE 168*  --  151*  --  146*  --   --   --   --  144*  --  150*  BUN 20  --  28*  --  34*  --   --   --   --  37*  --  35*  CREATININE 1.43*  --  1.28*  --  1.35*  --   --   --   --  1.31*  --  1.14  CALCIUM  8.7*  --  8.6*  --  8.4*  --   --   --   --  8.2*  --  8.4*   < > = values in this interval not displayed.   GFR: Estimated Creatinine Clearance: 86.5 mL/min (by C-G formula based on SCr of 1.14 mg/dL). Recent Labs  Lab 06/04/24 0606 06/05/24 0514 06/06/24 0415 06/07/24 0514  WBC 13.9* 13.8* 10.3 10.6*    Liver Function Tests: Recent Labs  Lab 06/02/24 1815  AST 21  ALT 13  ALKPHOS 46  BILITOT 0.7  PROT 7.1  ALBUMIN 3.5   No results for input(s): LIPASE, AMYLASE in the last 168 hours. No results for input(s): AMMONIA in the last 168 hours.  ABG    Component Value Date/Time   PHART 7.327 (L) 06/05/2024 1511   PCO2ART 53.5 (H) 06/05/2024 1511   PO2ART 256 (H) 06/05/2024 1511   HCO3 28.0 06/05/2024 1511   TCO2 30 06/05/2024 1511   O2SAT 100 06/05/2024 1511     Coagulation Profile: Recent Labs  Lab 06/02/24 1815  INR 1.2    Cardiac Enzymes: No results for input(s): CKTOTAL, CKMB, CKMBINDEX, TROPONINI in the last 168 hours.  HbA1C: Hgb A1c MFr Bld  Date/Time Value Ref Range Status  06/02/2024 10:35 PM 5.5 4.8 - 5.6 % Final    Comment:    (NOTE) Diagnosis of Diabetes The following HbA1c ranges recommended by the  American Diabetes Association (ADA) may be used as an aid in the diagnosis of diabetes mellitus.  Hemoglobin             Suggested A1C NGSP%              Diagnosis  <5.7                   Non Diabetic  5.7-6.4                Pre-Diabetic  >6.4                   Diabetic  <7.0                   Glycemic control for                       adults with diabetes.    11/10/2021 10:22 AM 5.9 (H) 4.8 - 5.6 % Final    Comment:             Prediabetes: 5.7 - 6.4          Diabetes: >6.4          Glycemic control for adults with diabetes: <7.0     CBG: Recent Labs  Lab 06/06/24 1910 06/06/24 2316 06/07/24 0331 06/07/24 0745 06/07/24 1146  GLUCAP 128* 164* 135* 143* 163*    Review of Systems:   As above  Past Medical History:  He,  has a past medical  history of 2019 novel coronavirus disease (COVID-19) (07/2019), COPD (chronic obstructive pulmonary disease) (HCC), HFrEF (heart failure with reduced ejection fraction) (HCC), Melanoma (HCC), Nonischemic cardiomyopathy (HCC), OSA (obstructive sleep apnea), and Pulmonary hypertension (HCC).   Surgical History:   Past Surgical History:  Procedure Laterality Date   ADENOIDECTOMY     APPLICATION OF CRANIAL NAVIGATION N/A 06/05/2024   Procedure: COMPUTER-ASSISTED NAVIGATION, FOR CRANIAL PROCEDURE;  Surgeon: Lanis Pupa, MD;  Location: [facility] OR;  Service: Neurosurgery;  Laterality: N/A;   CRANIOTOMY N/A 06/05/2024   Procedure: CRANIOTOMY TUMOR EXCISION;  Surgeon: Lanis Pupa, MD;  Location: [facility] OR;  Service: Neurosurgery;  Laterality: N/A;  STEREOTACTIC CRANIOTOMY FOR TUMOR   RIGHT/LEFT HEART CATH AND CORONARY ANGIOGRAPHY N/A 06/05/2019   Procedure: RIGHT/LEFT HEART CATH AND CORONARY ANGIOGRAPHY;  Surgeon: Dann Candyce RAMAN, MD;  Location: [facility] INVASIVE CV LAB;  Service: Cardiovascular;  Laterality: N/A;     Social History:   reports that he quit smoking about 35 years ago. His smoking use included cigarettes. He started  smoking about 50 years ago. He has a 15 pack-year smoking history. He has never used smokeless tobacco. He reports that he does not currently use alcohol. He reports that he does not use drugs.   Family History:  His family history includes Diabetes in his father and sister; Heart attack in his father; Heart disease in his sister; Kidney disease in his mother.   Allergies No Known Allergies   Home Medications  Prior to Admission medications   Medication Sig Start Date End Date Taking? Authorizing Provider  acetaminophen  (TYLENOL ) 500 MG tablet Take 1,000 mg by mouth.   Yes [provider]  albuterol  (VENTOLIN  HFA) 108 (90 Base) MCG/ACT inhaler Inhale 2 puffs into the lungs every 6 (six) hours as needed for wheezing or shortness of breath. 02/10/24  Yes Pauline Garnette LABOR, MD  aspirin  81 MG chewable tablet Chew 1 tablet (81 mg total) by mouth daily. 06/08/19  Yes Rosario Leatrice FERNS, MD  carvedilol  (COREG ) 6.25 MG tablet Take 1 tablet (6.25 mg total) by mouth 2 (two) times daily with a meal. Patient must schedule annual appointment for further refills 03/28/24  Yes Croitoru, Mihai, MD  cetirizine (ZYRTEC) 10 MG tablet Take 10 mg by mouth daily.   Yes [provider]  FARXIGA  10 MG TABS tablet Take 1 tablet (10 mg total) by mouth daily before breakfast. Patient must schedule annual appointment for further refills 03/28/24  Yes Croitoru, Mihai, MD  Multiple Vitamin (MULTIVITAMIN WITH MINERALS) TABS tablet Take 1 tablet by mouth daily.   Yes [provider]  rosuvastatin  (CRESTOR ) 10 MG tablet Take 1 tablet (10 mg total) by mouth daily. Patient must schedule annual appointment for further refills 03/28/24  Yes Croitoru, Mihai, MD  sacubitril -valsartan  (ENTRESTO ) 97-103 MG Take 1 tablet by mouth twice daily 09/21/23  Yes Croitoru, Mihai, MD  torsemide  (DEMADEX ) 20 MG tablet Take 1 tablet (20 mg total) by mouth as needed (Swelling). Patient taking differently: Take 20 mg by mouth  daily. 11/10/23  Yes Croitoru, Mihai, MD  amoxicillin -clavulanate (AUGMENTIN ) 875-125 MG tablet Take one tab PO Q12hr with food Patient not taking: Reported on 06/02/2024 02/10/24   Pauline Garnette LABOR, MD  benzonatate  (TESSALON ) 100 MG capsule Take 1 capsule (100 mg total) by mouth 3 (three) times daily as needed. Patient not taking: Reported on 06/02/2024 01/20/24   Kennyth Domino, FNP  fluticasone  (FLONASE ) 50 MCG/ACT nasal spray Place 2 sprays into both nostrils daily. Patient not taking:  Reported on 07/12/2023 11/19/19   Gretta Doffing P, DO  montelukast  (SINGULAIR ) 10 MG tablet Take one tab PO QHS Patient not taking: Reported on 06/02/2024 02/10/24   Pauline Garnette LABOR, MD    35 minutes of critical care time is spent taking care of this patient excluding any procedures.   Tamela Stakes, MD  Attending Physician, Critical Care Medicine [facility] See Amion for pager If no response to pager, please call [phone] until 7pm After 7pm, Please call E-link [phone]

## 2024-06-07 NOTE — Evaluation (Signed)
 Physical Therapy Evaluation Patient Details Name: Justin Rasmussen MRN: [id number] DOB: [birth year] Today's Date: 06/07/2024  History of Present Illness  68 y.o. male history of melanoma currently in the process of getting immunotherapy presented 06/02/24 with altered mental status, aphasia, and Rt sided weakness. Initial CT scan showing a left frontal hemorrhage. MRI showed underlying mass. S/p L frontal crani for tumor resection and evacuation of L frontal hematoma & transbronchial needle aspiration 9/30. Extubated 10/1. Questionable seizure activity noted during hospitalization. PMH-COPD, HF, cardiomyopathy, pulmonary HTN, OSA, melanoma on immunotherapy   Clinical Impression  Pt presents with condition above and deficits mentioned below, see PT Problem List. PTA, he was independent without DME and living with his wife in a multi-level house with a ramped entrance option. Pt can stay on the main level of the house. Currently, the pt is nonverbal, intermittently inconsistently answering questions via head nods/shakes. Pt is inconsistent in following simple multi-modal cues with his L side as well. He minimally if at all moved his R extremities and displays R inattention as well. The pt is benefiting from gestures along with verbal cues to initiate movements at this time. He required maxA to transition supine to sit EOB, mod-maxA to balance sitting EOB, and total assist x2 to transition sit to supine. Deferred OOB attempts this date due to his BP rising above the SBP goal of < 160 just sitting EOB, even with IV BP meds increased while sitting EOB. Thus, pt was returned to supine for safety and his BP returned to being within parameters. See BP measurements below. As the pt has had a drastic functional decline and has great family support at d/c, he could greatly benefit from intensive inpatient rehab, > 3 hours/day. Will continue to follow acutely.  BP (taken at R lower leg) --  164/98 supine 204/130 sitting  EOB 180/118 sitting EOB after IV BP meds increased 143/84 supine end of session        If plan is discharge home, recommend the following: Two people to help with walking and/or transfers;Two people to help with bathing/dressing/bathroom;Assistance with cooking/housework;Assistance with feeding;Direct supervision/assist for medications management;Direct supervision/assist for financial management;Assist for transportation;Help with stairs or ramp for entrance;Supervision due to cognitive status   Can travel by private vehicle        Equipment Recommendations BSC/3in1;Hospital bed;Hoyer lift (pending progress)  Recommendations for Other Services  Rehab consult    Functional Status Assessment Patient has had a recent decline in their functional status and demonstrates the ability to make significant improvements in function in a reasonable and predictable amount of time.     Precautions / Restrictions Precautions Precautions: Fall;Other (comment) Recall of Precautions/Restrictions: Impaired Precaution/Restrictions Comments: watch HR, EEG, SBP < 160, bil mitts Restrictions Weight Bearing Restrictions Per Provider Order: No      Mobility  Bed Mobility Overal bed mobility: Needs Assistance Bed Mobility: Supine to Sit, Sit to Supine     Supine to sit: Max assist, HOB elevated Sit to supine: Total assist, +2 for physical assistance, +2 for safety/equipment   General bed mobility comments: Gestures provided along with verbal and tactile cues to come on and get up with pt initiating intermittently then stopping after brief activation. Pt needed maxA to ascend trunk and pivot R hip towards L EOB to sit up. BP noted to rise past desired goal when sitting EOB, thus pt returned to supine. Total assist x2 needed to manage trunk and legs sit to supine and then scoot superiorly in bed  with bed in trendelenburg position.    Transfers                   General transfer comment:  Unable to safely test due to BP rising with sitting EOB, pt returned to supine due to BP remaining over BP parameters even with increase of IV BP meds during session    Ambulation/Gait               General Gait Details: Unable to safely test due to BP rising with sitting EOB, pt returned to supine due to BP remaining over BP parameters even with increase of IV BP meds during session  Stairs            Wheelchair Mobility     Tilt Bed    Modified Rankin (Stroke Patients Only) Modified Rankin (Stroke Patients Only) Pre-Morbid Rankin Score: No symptoms Modified Rankin: Severe disability     Balance Overall balance assessment: Needs assistance Sitting-balance support: Single extremity supported, No upper extremity supported, Feet supported Sitting balance-Leahy Scale: Poor Sitting balance - Comments: Pt leans to the R, needing mod-maxA for static sitting balance Postural control: Right lateral lean     Standing balance comment: Unable to safely test due to BP rising with sitting EOB, pt returned to supine due to BP remaining over BP parameters even with increase of IV BP meds during session                             Pertinent Vitals/Pain Pain Assessment Pain Assessment: Faces Faces Pain Scale: No hurt Pain Intervention(s): Monitored during session    Home Living Family/patient expects to be discharged to:: Private residence Living Arrangements: Spouse/significant other Available Help at Discharge: Family;Available 24 hours/day Type of Home: House Home Access: Ramped entrance;Stairs to enter Entrance Stairs-Rails: None Entrance Stairs-Number of Steps: 1   Home Layout: Able to live on main level with bedroom/bathroom;Multi-level Home Equipment: Other (comment);Wheelchair - power (knee scooter)      Prior Function Prior Level of Function : Independent/Modified Independent;Driving             Mobility Comments: was on knee scooter until  April due to melanoma wound on foot then once healed was IND without DME ADLs Comments: retired     Extremity/Trunk Assessment   Upper Extremity Assessment Upper Extremity Assessment: Defer to OT evaluation;Right hand dominant    Lower Extremity Assessment Lower Extremity Assessment: RLE deficits/detail;LLE deficits/detail RLE Deficits / Details: minimal if any movement noted throughout R leg except did withdraw to noxious stimuli after delay; WFL PROM, flaccid LLE Deficits / Details: able to extend knee against gravity when cued, but inconsistent in following commands to formally assess strength; withdraws to noxious stimuli    Cervical / Trunk Assessment Cervical / Trunk Assessment: Other exceptions Cervical / Trunk Exceptions: increased body habitus  Communication   Communication Communication: Impaired Factors Affecting Communication: Difficulty expressing self (nonverbal this session)    Cognition Arousal: Alert Behavior During Therapy: Flat affect   PT - Cognitive impairments: Difficult to assess, Attention, Initiation, Sequencing, Problem solving, Safety/Judgement Difficult to assess due to: Impaired communication                     PT - Cognition Comments: Pt nonverbal throughout session, only answering questions via head nods/shakes inconsistently. Able to follows simple multi-modal cues inconsistently with L side, < 25% of time. Able to provide thumbs  up on L after repeated cuing. Appropriately nodded yes to his name but unable to provide answer for DOB questions. Poor attention to R side Following commands: Impaired Following commands impaired: Follows one step commands inconsistently, Follows one step commands with increased time     Cueing Cueing Techniques: Verbal cues, Gestural cues, Tactile cues, Visual cues     General Comments General comments (skin integrity, edema, etc.): BP (taken at R lower leg) 164/98 supine, 204/130 sitting EOB, 180/118 sitting  EOB after IV BP meds increased, 143/84 supine end of session; monitor alarming VTach and Afib intermittently, on 5L O2    Exercises     Assessment/Plan    PT Assessment Patient needs continued PT services  PT Problem List Decreased strength;Decreased activity tolerance;Decreased balance;Decreased mobility;Decreased coordination;Decreased cognition;Decreased knowledge of use of DME;Decreased safety awareness;Cardiopulmonary status limiting activity;Impaired sensation       PT Treatment Interventions DME instruction;Gait training;Stair training;Functional mobility training;Therapeutic activities;Therapeutic exercise;Balance training;Neuromuscular re-education;Cognitive remediation;Patient/family education    PT Goals (Current goals can be found in the Care Plan section)  Acute Rehab PT Goals Patient Stated Goal: unable to state; wife is hoping for pt to improve PT Goal Formulation: With patient/family Time For Goal Achievement: 06/21/24 Potential to Achieve Goals: Good    Frequency Min 3X/week     Co-evaluation               AM-PAC PT 6 Clicks Mobility  Outcome Measure Help needed turning from your back to your side while in a flat bed without using bedrails?: A Lot Help needed moving from lying on your back to sitting on the side of a flat bed without using bedrails?: A Lot Help needed moving to and from a bed to a chair (including a wheelchair)?: Total Help needed standing up from a chair using your arms (e.g., wheelchair or bedside chair)?: Total Help needed to walk in hospital room?: Total Help needed climbing 3-5 steps with a railing? : Total 6 Click Score: 8    End of Session Equipment Utilized During Treatment: Oxygen Activity Tolerance: Treatment limited secondary to medical complications (Comment) (limited by BP over goal) Patient left: in bed;with call bell/phone within reach;with bed alarm set;with restraints reapplied;with family/visitor present Nurse  Communication: Mobility status;Other (comment) (BP, request for prevalons) PT Visit Diagnosis: Muscle weakness (generalized) (M62.81);Difficulty in walking, not elsewhere classified (R26.2);Other symptoms and signs involving the nervous system (R29.898);Hemiplegia and hemiparesis Hemiplegia - Right/Left: Right Hemiplegia - dominant/non-dominant: Dominant Hemiplegia - caused by: Other Nontraumatic intracranial hemorrhage (tumor)    Time: 8943-8862 PT Time Calculation (min) (ACUTE ONLY): 41 min   Charges:   PT Evaluation $PT Eval Moderate Complexity: 1 Mod PT Treatments $Therapeutic Activity: 23-37 mins PT General Charges $$ ACUTE PT VISIT: 1 Visit         Theo Ferretti, PT, DPT Acute Rehabilitation Services  Office: [phone]   Theo CHRISTELLA Ferretti 06/07/2024, 11:50 AM

## 2024-06-07 NOTE — Progress Notes (Signed)
 PT working with patient. Will have to come back for LTM DC.

## 2024-06-07 NOTE — Progress Notes (Addendum)
 STROKE TEAM PROGRESS NOTE    SIGNIFICANT HOSPITAL EVENTS 9/27: Patient presented with right-sided weakness and aphasia, found to have left frontal ICH. 9/28: MRI reveals hemorrhagic tumor in the anterior left frontal lobe, neurosurgery consulted 9/30: Craniotomy for tumor resection 10/1: Extubated at 1318  INTERIM HISTORY/SUBJECTIVE  Wife and RN at bedside.  Repeat CT head this morning shows interval improvement of IPH. On exam, patient continues to have right 6 cranial nerve palsy.  He does not follow commands, no verbal output.  Strong drift to right arm with passive raise.  Continues on low-dose clevi, BP goal <160. IVP PRNs available.  LTM negative, will discontinue.   Continues off 3%, NA this morning 157.  Will continue to monitor with possible need for low rate of half-normal saline.  OBJECTIVE  CBC    Component Value Date/Time   WBC 10.6 (H) 06/07/2024 0514   RBC 4.73 06/07/2024 0514   HGB 12.3 (L) 06/07/2024 0514   HCT 41.5 06/07/2024 0514   PLT 267 06/07/2024 0514   MCV 87.7 06/07/2024 0514   MCH 26.0 06/07/2024 0514   MCHC 29.6 (L) 06/07/2024 0514   RDW 16.4 (H) 06/07/2024 0514   LYMPHSABS 0.9 06/02/2024 1815   MONOABS 0.6 06/02/2024 1815   EOSABS 0.1 06/02/2024 1815   BASOSABS 0.0 06/02/2024 1815    BMET    Component Value Date/Time   NA 157 (H) 06/07/2024 0514   NA 141 11/10/2021 1022   K 3.9 06/07/2024 0514   CL 123 (H) 06/07/2024 0514   CO2 24 06/07/2024 0514   GLUCOSE 150 (H) 06/07/2024 0514   BUN 35 (H) 06/07/2024 0514   BUN 18 11/10/2021 1022   CREATININE 1.14 06/07/2024 0514   CALCIUM  8.4 (L) 06/07/2024 0514   EGFR 76 11/10/2021 1022   GFRNONAA >60 06/07/2024 0514    IMAGING past 24 hours CT HEAD WO CONTRAST ( ) Result Date: 06/07/2024 EXAM: CT HEAD WITHOUT CONTRAST 06/07/2024 04:26:58 AM TECHNIQUE: CT of the head was performed without the administration of intravenous contrast. Automated exposure control, iterative reconstruction,  and/or weight based adjustment of the mA/kV was utilized to reduce the radiation dose to as low as reasonably achievable. COMPARISON: CT of the head dated 06/03/2024. CLINICAL HISTORY: Stroke, follow up. FINDINGS: BRAIN AND VENTRICLES: Status post left frontal parietal craniotomy for partial evacuation of hemorrhagic mass within the left frontal lobe. The area of intraparenchymal hemorrhage has significantly decreased in size from 5.0 cm in transverse diameter to 3.5 cm. There has been interval improvement of shift of the midline structures from 11 mm to 8 mm. There has been interval development of subarachnoid hemorrhage bilaterally, most conspicuously seen within the right parietal sulci. There is also intraventricular hemorrhage present bilaterally, which is mildly increased in volume in the interim. No evidence of acute infarct. No hydrocephalus. ORBITS: No acute abnormality. SINUSES: No acute abnormality. SOFT TISSUES AND SKULL: Status post left frontal parietal craniotomy. No acute soft tissue abnormality. No skull fracture. IMPRESSION: 1. Interval improvement of left frontal lobe intraparenchymal hemorrhage and midline shift. 2. Interval development of bilateral subarachnoid hemorrhage, most conspicuous in the right parietal sulci. 3. Mildly increased bilateral intraventricular hemorrhage. Electronically signed by: Evalene Coho MD 06/07/2024 04:46 AM EDT RP Workstation: [id number]   EEG adult Result Date: 06/06/2024 Shelton Arlin KIDD, MD     06/06/2024  3:25 PM Patient Name: Carmen Vallecillo MRN: [id number] Epilepsy Attending: Arlin KIDD Shelton Referring Physician/Provider: Judithe Rocky BROCKS, NP Date: 06/06/2024 Duration: 22.51 mins Patient history: [phone]  M with lower right jaw rhythmic movements. EEG to evaluate for seizure Level of alertness: Awake/ lethargic AEDs during EEG study: LEV Technical aspects: This EEG study was done with scalp electrodes positioned according to the 10-20 International system of  electrode placement. Electrical activity was reviewed with band pass filter of 1-70Hz , sensitivity of 7 uV/mm, display speed of 37mm/sec with a 60Hz  notched filter applied as appropriate. EEG data were recorded continuously and digitally stored.  Video monitoring was available and reviewed as appropriate. Description: EEG showed continuous generalized and maximal bilateral frontal polymorphic sharply contoured 3 to 6 Hz theta-delta slowing. Hyperventilation and photic stimulation were not performed.   ABNORMALITY - Continuous slow, generalized IMPRESSION: This study is suggestive of moderate to severe diffuse encephalopathy. No seizures or definite epileptiform discharges were seen throughout the recording. If suspicion for interictal activity remains a concern, a prolonged study should be considered. Priyanka MALVA Krebs    Vitals:   06/07/24 0730 06/07/24 0745 06/07/24 0800 06/07/24 0815  BP:   (!) 152/93   Pulse: 79 92 91 90  Resp: (!) 21 (!) 26 (!) 24 (!) 23  Temp:   99.5 F (37.5 C)   TempSrc:   Axillary   SpO2: 94% 95% 96% 95%  Weight:      Height:       PHYSICAL EXAM General:  Alert, obese patient in no acute distress CV: Regular rate and rhythm on monitor, tachycardic with continued stimuli. Respiratory: Intermittently labored   NEURO:  Eyes open spontaneously patient is responsive to voice.  Does not consistently follow simple commands. No verbal output.   Cranial Nerves:  II: PERRL.  Blinking to visual threat bilaterally III, IV, VI: Eyelids elevate symmetrically, disconjugate R side gaze.  Left eye with disconjugate/downward gaze and increased swelling.  CN VI palsy VII: Right facial droop  VIII: hearing intact to voice. IX, X: Cough and gag reflex positive Motor/sensory: Spontaneous movement of left arm seen, lifts off bed holds against gravity  Withdrawal movement seen in all other extremities, less on right. Strong drift to RUE.  Tone: is normal and bulk is  normal Sensation-unable to assess Coordination: FTN unable to perform d/t not following commands Gait- deferred  Most Recent NIH 7   ASSESSMENT/PLAN  Mr. Jakeim Sedore is a 68 y.o. male with history of melanoma on immunotherapy admitted for right-sided weakness and aphasia. He was found to have a left frontal ICH on CT, and MRI revealed a hemorrhagic tumor. NIH on Admission 8. S/p stereotactic craniotomy.   ICH: left frontal ICH, etiology: Hemorrhagic mass, likely from metastatic melanoma Code Stroke CT head hemorrhage in the anterior left frontal lobe with possible underlying mass and marked surrounding vasogenic edema with 9 mm of midline shift CTA head & neck no LVO, hemodynamically significant stenosis or aneurysm MRI hemorrhagic tumor of the anterior left frontal lobe with 5 cm enhancing tumor with larger surrounding acute hemorrhage, moderate volume of the intraventricular blood, 12 mm midline shift Follow-up CT 9/28: Stable left frontal lobe hemorrhagic mass measuring up to 6 cm with associated vasogenic edema and midline shift of up to 11 mm. S/p hematoma evacuation 9/30 CT head 10/2 improved left frontal ICH and midline shift, bilateral SAH 2D Echo EF 60-65%, mildly dilated left ventricular internal cavity and mildly reduced right ventricular systolic function. LDL 93 HgbA1c 5.5 VTE prophylaxis - SCDs aspirin  81 mg daily prior to admission, continue no antithrombotic due to ICH Therapy recommendations: Pending Disposition: Pending  Cerebral edema (  brain compression) Cerebral edema seen on head CT and MRI with 12 mm of midline shift Decadron  initiated by neurosurgery for edema due to tumor, continues 4mg  Q6H 3% saline at 50 cc/h -> NS @ 50->0.45% saline Gradually normalize sodium level Na 145->143->150->151->154->155->157->156->155->156->157   Hemorrhagic tumor, likely metastatic melanoma Patient has history of melanoma of right foot, resected last year with positive lymph  nodes, currently on immunotherapy Hemorrhagic tumor seen on MRI Continue keppra 1g bid EEG completed due to rhythmic lower jaw movement seen on exam 10/1, negative for seizures.  Neurosurgery consulted S/p stereotactic craniotomy 9/30 Post-op MRI shows decreased hematoma volume with no new metastatic disease identified.  Stable to mildly improved mass effect, stable IVH.  Possible increase of bilateral SAH noted on read.   Hypertension CHFrEF Home meds: Carvedilol  6.25 mg twice daily, Entresto  twice daily torsemide  20 mg as needed Stable on the high end Now on Cleviprex Continue IV metoprolol 5mg  Q6H BP goal less than 160 Long-term BP goal normotensive   Hyperlipidemia Home meds: Rosuvastatin  10 mg daily LDL 93, goal < 70 Resume statin at discharge and consider increase to 20 mg  Tobacco use Former cigarette smoker Quit in 1990   Dysphagia Currently n.p.o., pending increased alertness and SLP eval IV fluid Speech on board Will need core Trak tomorrow for tube feeding.  Other Stroke Risk Factors Obesity, Body mass index is 47.68 kg/m., BMI >/= 30 associated with increased stroke risk, recommend weight loss, diet and exercise as appropriate  OSA   Other Active Problems Pulmonary hypertension CKD 3a, creatinine 1.26-1.43-1.28-1.35-1.31-1.14  Hospital day # 5   Pt seen by Neuro NP/APP with MD. Note/plan to be edited by MD as needed.    Rocky JAYSON Likes, DNP [facility] Please use AMION for contact information & EPIC for messaging.  ATTENDING NOTE: I reviewed above note and agree with the assessment and plan. Pt was seen and examined.   Wife at bedside.  Patient lying in bed, more awake alert than yesterday, still has mild tachypnea.  Still has right eye since VI nerve palsy, however left eye able to track bilaterally.  Right upper extremity flaccid today.  CT repeated today showed improved left frontal ICH and midline shift.  EEG no seizure, will take off  sodium was 57, on half-normal saline.  BP still on the high end, restarted Cleviprex.  Still has no p.o. access, pending core Trak tomorrow.  For detailed assessment and plan, please refer to above as I have made changes wherever appropriate.   Ary Cummins, MD PhD Stroke Neurology 06/07/2024 5:54 PM   This patient is critically ill due to left ICH, small SAH and IVH, left frontal metastatic melanoma, cerebral edema, AKI and at significant risk of neurological worsening, death form brain herniation, hydrocephalus, further hemorrhage from tumor, renal failure and respite failure. This patient's care requires constant monitoring of vital signs, hemodynamics, respiratory and cardiac monitoring, review of multiple databases, neurological assessment, discussion with family, other specialists and medical decision making of high complexity. I spent 40 minutes of neurocritical care time in the care of this patient. I had long discussion with wife at bedside, updated pt current condition, treatment plan and potential prognosis, and answered all the questions.  She expressed understanding and appreciation.  I discussed with Dr. Gatha CCM.

## 2024-06-08 DIAGNOSIS — N179 Acute kidney failure, unspecified: Secondary | ICD-10-CM

## 2024-06-08 DIAGNOSIS — I611 Nontraumatic intracerebral hemorrhage in hemisphere, cortical: Secondary | ICD-10-CM | POA: Diagnosis not present

## 2024-06-08 DIAGNOSIS — R29723 NIHSS score 23: Secondary | ICD-10-CM

## 2024-06-08 DIAGNOSIS — I615 Nontraumatic intracerebral hemorrhage, intraventricular: Secondary | ICD-10-CM | POA: Diagnosis not present

## 2024-06-08 DIAGNOSIS — E870 Hyperosmolality and hypernatremia: Secondary | ICD-10-CM | POA: Diagnosis not present

## 2024-06-08 DIAGNOSIS — I609 Nontraumatic subarachnoid hemorrhage, unspecified: Secondary | ICD-10-CM | POA: Diagnosis not present

## 2024-06-08 DIAGNOSIS — C7931 Secondary malignant neoplasm of brain: Secondary | ICD-10-CM

## 2024-06-08 DIAGNOSIS — I629 Nontraumatic intracranial hemorrhage, unspecified: Secondary | ICD-10-CM | POA: Diagnosis not present

## 2024-06-08 DIAGNOSIS — D496 Neoplasm of unspecified behavior of brain: Secondary | ICD-10-CM

## 2024-06-08 LAB — GLUCOSE, CAPILLARY
Glucose-Capillary: 142 mg/dL — ABNORMAL HIGH (ref 70–99)
Glucose-Capillary: 148 mg/dL — ABNORMAL HIGH (ref 70–99)
Glucose-Capillary: 151 mg/dL — ABNORMAL HIGH (ref 70–99)
Glucose-Capillary: 175 mg/dL — ABNORMAL HIGH (ref 70–99)
Glucose-Capillary: 191 mg/dL — ABNORMAL HIGH (ref 70–99)
Glucose-Capillary: 169 mg/dL — ABNORMAL HIGH (ref 70–99)

## 2024-06-08 LAB — SODIUM: Sodium: 160 mmol/L — ABNORMAL HIGH (ref 135–145)

## 2024-06-08 MED ORDER — POLYETHYLENE GLYCOL 3350 17 G PO PACK: 17.0000 g | PACK | Freq: Every day | ORAL | Status: DC | PRN

## 2024-06-08 MED ORDER — SACUBITRIL-VALSARTAN 97-103 MG PO TABS
1.0000 | ORAL_TABLET | Freq: Two times a day (BID) | ORAL | Status: DC
  Filled 2024-06-08: qty 1

## 2024-06-08 MED ORDER — FREE WATER
200.0000 mL | Freq: Four times a day (QID) | Status: DC
  Administered 2024-06-08 – 2024-06-10 (×8): 200 mL

## 2024-06-08 MED ORDER — THIAMINE MONONITRATE 100 MG PO TABS
100.0000 mg | ORAL_TABLET | Freq: Every day | ORAL | Status: AC
  Administered 2024-06-08 – 2024-06-14 (×7): 100 mg
  Filled 2024-06-08 (×7): qty 1

## 2024-06-08 MED ORDER — PROMETHAZINE HCL 12.5 MG PO TABS: 12.5000 mg | ORAL_TABLET | ORAL | Status: DC | PRN

## 2024-06-08 MED ORDER — DOCUSATE SODIUM 50 MG/5ML PO LIQD: 100.0000 mg | Freq: Two times a day (BID) | ORAL | Status: DC | PRN

## 2024-06-08 MED ORDER — PROSOURCE TF20 ENFIT COMPATIBL EN LIQD
60.0000 mL | Freq: Two times a day (BID) | ENTERAL | Status: DC
  Administered 2024-06-08 – 2024-06-13 (×9): 60 mL
  Filled 2024-06-08 (×9): qty 60

## 2024-06-08 MED ORDER — INSULIN ASPART 100 UNIT/ML IJ SOLN
0.0000 [IU] | INTRAMUSCULAR | Status: DC
  Administered 2024-06-08 – 2024-06-09 (×9): 4 [IU] via SUBCUTANEOUS
  Administered 2024-06-09: 3 [IU] via SUBCUTANEOUS
  Administered 2024-06-10: 7 [IU] via SUBCUTANEOUS
  Administered 2024-06-10 (×2): 4 [IU] via SUBCUTANEOUS
  Administered 2024-06-10: 3 [IU] via SUBCUTANEOUS
  Administered 2024-06-10 – 2024-06-11 (×3): 4 [IU] via SUBCUTANEOUS
  Administered 2024-06-11: 3 [IU] via SUBCUTANEOUS
  Administered 2024-06-11 (×2): 4 [IU] via SUBCUTANEOUS
  Administered 2024-06-11 – 2024-06-12 (×4): 3 [IU] via SUBCUTANEOUS
  Administered 2024-06-12 (×3): 4 [IU] via SUBCUTANEOUS
  Administered 2024-06-13: 7 [IU] via SUBCUTANEOUS
  Administered 2024-06-13: 4 [IU] via SUBCUTANEOUS
  Administered 2024-06-13 (×2): 7 [IU] via SUBCUTANEOUS
  Administered 2024-06-13: 4 [IU] via SUBCUTANEOUS
  Administered 2024-06-14 (×2): 3 [IU] via SUBCUTANEOUS
  Administered 2024-06-14: 7 [IU] via SUBCUTANEOUS
  Administered 2024-06-14: 11 [IU] via SUBCUTANEOUS
  Administered 2024-06-14: 4 [IU] via SUBCUTANEOUS
  Administered 2024-06-14: 7 [IU] via SUBCUTANEOUS
  Administered 2024-06-15: 4 [IU] via SUBCUTANEOUS
  Administered 2024-06-15 (×2): 7 [IU] via SUBCUTANEOUS
  Administered 2024-06-15: 11 [IU] via SUBCUTANEOUS
  Administered 2024-06-15: 7 [IU] via SUBCUTANEOUS
  Administered 2024-06-15: 3 [IU] via SUBCUTANEOUS
  Administered 2024-06-16: 11 [IU] via SUBCUTANEOUS
  Administered 2024-06-16: 3 [IU] via SUBCUTANEOUS
  Administered 2024-06-16: 7 [IU] via SUBCUTANEOUS
  Administered 2024-06-16: 4 [IU] via SUBCUTANEOUS
  Administered 2024-06-16: 7 [IU] via SUBCUTANEOUS
  Administered 2024-06-16: 11 [IU] via SUBCUTANEOUS
  Administered 2024-06-17: 3 [IU] via SUBCUTANEOUS
  Administered 2024-06-17: 4 [IU] via SUBCUTANEOUS
  Administered 2024-06-17 (×2): 3 [IU] via SUBCUTANEOUS
  Administered 2024-06-17: 4 [IU] via SUBCUTANEOUS
  Administered 2024-06-17: 3 [IU] via SUBCUTANEOUS
  Administered 2024-06-18 (×3): 4 [IU] via SUBCUTANEOUS
  Administered 2024-06-18: 7 [IU] via SUBCUTANEOUS
  Administered 2024-06-18 (×2): 4 [IU] via SUBCUTANEOUS
  Administered 2024-06-19 (×2): 3 [IU] via SUBCUTANEOUS

## 2024-06-08 MED ORDER — ADULT MULTIVITAMIN W/MINERALS CH
1.0000 | ORAL_TABLET | Freq: Every day | ORAL | Status: DC
  Administered 2024-06-08 – 2024-06-16 (×9): 1
  Filled 2024-06-08 (×10): qty 1

## 2024-06-08 MED ORDER — CARVEDILOL 6.25 MG PO TABS
6.2500 mg | ORAL_TABLET | Freq: Two times a day (BID) | ORAL | Status: DC
  Administered 2024-06-08 – 2024-06-10 (×5): 6.25 mg
  Filled 2024-06-08 (×2): qty 2
  Filled 2024-06-08: qty 1
  Filled 2024-06-08 (×2): qty 2

## 2024-06-08 MED ORDER — SACUBITRIL-VALSARTAN 97-103 MG PO TABS
1.0000 | ORAL_TABLET | Freq: Two times a day (BID) | ORAL | Status: DC
  Administered 2024-06-08 – 2024-06-10 (×5): 1
  Filled 2024-06-08 (×7): qty 1

## 2024-06-08 MED ORDER — HEPARIN SODIUM (PORCINE) 5000 UNIT/ML IJ SOLN
5000.0000 [IU] | Freq: Three times a day (TID) | INTRAMUSCULAR | Status: DC
Start: 2024-06-08 — End: 2024-06-08

## 2024-06-08 MED ORDER — OSMOLITE 1.5 CAL PO LIQD
1000.0000 mL | ORAL | Status: DC
  Administered 2024-06-08 – 2024-06-12 (×3): 1000 mL

## 2024-06-08 MED ORDER — CARVEDILOL 3.125 MG PO TABS: 6.2500 mg | ORAL_TABLET | Freq: Two times a day (BID) | ORAL | Status: DC

## 2024-06-08 MED ORDER — ENOXAPARIN SODIUM 80 MG/0.8ML IJ SOSY
70.0000 mg | PREFILLED_SYRINGE | INTRAMUSCULAR | Status: DC
  Administered 2024-06-08 – 2024-06-13 (×6): 70 mg via SUBCUTANEOUS
  Filled 2024-06-08 (×3): qty 0.7
  Filled 2024-06-08: qty 0.8
  Filled 2024-06-08 (×2): qty 0.7

## 2024-06-08 MED ORDER — LEVETIRACETAM 500 MG PO TABS
1000.0000 mg | ORAL_TABLET | Freq: Two times a day (BID) | ORAL | Status: DC
Start: 2024-06-08 — End: 2024-06-17
  Administered 2024-06-08 – 2024-06-09 (×3): 1000 mg via ORAL
  Filled 2024-06-08 (×3): qty 2

## 2024-06-08 MED ORDER — FUROSEMIDE 10 MG/ML IJ SOLN
60.0000 mg | Freq: Once | INTRAMUSCULAR | Status: AC
  Administered 2024-06-08: 60 mg via INTRAVENOUS
  Filled 2024-06-08: qty 6

## 2024-06-08 NOTE — TOC Initial Note (Signed)
 Transition of Care [facility]) - Initial/Assessment Note    Patient Details  Name: Justin Rasmussen MRN: [id number] Date of Birth: 12-02-1955  Transition of Care [facility] CM/SW Contact:    Natarsha Hurwitz M, RN Phone Number: 06/08/2024, 4:29 PM  Clinical Narrative:                 68 y.o. male history of melanoma currently in the process of getting immunotherapy presented 06/02/24 with altered mental status, aphasia, and Rt sided weakness. Initial CT scan showing a left frontal hemorrhage. MRI showed underlying mass. S/p L frontal crani for tumor resection and evacuation of L frontal hematoma & transbronchial needle aspiration 9/30. Extubated 10/1. Questionable seizure activity noted during hospitalization.  PTA, pt was independent and driving; lives with spouse. PT/OT recommending CIR pending progression with therapies.  Inpatient Care Management to follow as patient progresses.   Expected Discharge Plan: IP Rehab Facility Barriers to Discharge: Continued Medical Work up            Expected Discharge Plan and Services   Discharge Planning Services: CM Consult   Living arrangements for the past 2 months: Single Family Home                                      Prior Living Arrangements/Services Living arrangements for the past 2 months: Single Family Home Lives with:: Spouse Patient language and need for interpreter reviewed:: Yes        Need for Family Participation in Patient Care: Yes (Comment) Care giver support system in place?: Yes (comment)   Criminal Activity/Legal Involvement Pertinent to Current Situation/Hospitalization: No - Comment as needed                 Emotional Assessment   Attitude/Demeanor/Rapport: Unable to Assess Affect (typically observed): Unable to Assess        Admission diagnosis:  ICH (intracerebral hemorrhage) (HCC) [I61.9] Patient Active Problem List   Diagnosis Date Noted   Melanoma metastatic to brain (HCC) 06/04/2024    Intracranial hemorrhage (HCC) 06/02/2024   Wheezing on auscultation 11/16/2023   Acute midline low back pain without sciatica 11/16/2023   OSA (obstructive sleep apnea) 07/04/2019   Cor pulmonale (chronic) (HCC)    Cardiomyopathy- etiology not yet determined 06/03/2019   Morbid obesity (HCC) 06/03/2019   Elevated hemidiaphragm-Rt 06/03/2019   RBBB 06/03/2019   Acute systolic congestive heart failure (HCC) 06/01/2019   PCP:  Colette Torrence GRADE, MD Pharmacy:   [facility] [address], [place] - [address] [place] [address] Phone: [phone] Fax: [phone]  [facility] [address] [place], [place] - [address] [place] [address] Phone: [phone] Fax: [phone]  [facility] [place], [place] - [address] [place] [address] Phone: [phone] Fax: [phone]     Social Drivers of Health (SDOH) Social History: SDOH Screenings   Food Insecurity: No Food Insecurity (06/04/2024)  Housing: Low Risk  (06/04/2024)  Transportation Needs: No Transportation Needs (06/04/2024)  Utilities: Not At Risk (06/04/2024)  Depression (PHQ2-9): Low Risk  (07/12/2023)  Physical Activity: Unknown (06/01/2019)  Social Connections: Unknown (06/04/2024)  Stress: No Stress Concern Present (06/01/2019)  Tobacco Use: Medium Risk (06/05/2024)   SDOH Interventions:     Readmission Risk Interventions     No data to display  Mliss MICAEL Fass, RN, BSN  Trauma/Neuro ICU Case Manager [phone]

## 2024-06-08 NOTE — Progress Notes (Signed)
 Physical Therapy Treatment Patient Details Name: Justin Rasmussen MRN: [id number] DOB: 1956/03/12 Today's Date: 06/08/2024   History of Present Illness 68 y.o. male history of melanoma currently in the process of getting immunotherapy presented 06/02/24 with altered mental status, aphasia, and Rt sided weakness. Initial CT scan showing a left frontal hemorrhage. MRI showed underlying mass. S/p L frontal crani for tumor resection and evacuation of L frontal hematoma & transbronchial needle aspiration 9/30. Extubated 10/1. Questionable seizure activity noted during hospitalization. PMH-COPD, HF, cardiomyopathy, pulmonary HTN, OSA, melanoma on immunotherapy    PT Comments  The pt is demonstrating gradual progress in his ability to follow commands with his L side. He is still nonverbal and following commands inconsistently though (~25% of time accurately). He was again limited from being able to safely attempt OOB mobility this date due to his BP rising above the set goal when sitting EOB, even with IV BP meds increased while sitting there, resulting in him having to be returned to supine. See General Comments below in regards to BP measurements. He is requiring maxAx2 to roll and transition supine > sit EOB at this time. He did move his R leg to assist with positioning during rolling when cued. Will continue to follow acutely.    If plan is discharge home, recommend the following: Two people to help with walking and/or transfers;Two people to help with bathing/dressing/bathroom;Assistance with cooking/housework;Assistance with feeding;Direct supervision/assist for medications management;Direct supervision/assist for financial management;Assist for transportation;Help with stairs or ramp for entrance;Supervision due to cognitive status   Can travel by private vehicle        Equipment Recommendations  BSC/3in1;Hospital bed;Hoyer lift (pending progress)    Recommendations for Other Services Rehab  consult     Precautions / Restrictions Precautions Precautions: Fall;Other (comment) Recall of Precautions/Restrictions: Impaired Precaution/Restrictions Comments: watch HR, SBP < 160, bil mitts Restrictions Weight Bearing Restrictions Per Provider Order: No     Mobility  Bed Mobility Overal bed mobility: Needs Assistance Bed Mobility: Supine to Sit, Sit to Supine, Rolling Rolling: Max assist, +2 for physical assistance, +2 for safety/equipment   Supine to sit: Max assist, HOB elevated, +2 for physical assistance, +2 for safety/equipment Sit to supine: Total assist, +2 for physical assistance, +2 for safety/equipment   General bed mobility comments: Gestures provided along with verbal and tactile cues to come on and get up with pt initiating intermittently then stopping after brief activation. Pt needed maxA +2 to ascend trunk and pivot R hip towards L EOB to sit up with assist from bed pad. Total assist x2 helicopter technique needed to manage trunk and legs sit to supine and then scoot superiorly in bed with bed in trendelenburg position. Pt did flex R when cued to begin roll to L and did reach L UE to therapist and flex L leg when cued to roll to R, maxAx2 to roll bil    Transfers                   General transfer comment: Unable to safely test due to BP rising with sitting EOB, pt returned to supine due to BP remaining over BP parameters even with increase of IV BP meds during session    Ambulation/Gait               General Gait Details: Unable to safely test due to BP rising with sitting EOB, pt returned to supine due to BP remaining over BP parameters even with increase of IV BP meds  during session   Stairs             Wheelchair Mobility     Tilt Bed    Modified Rankin (Stroke Patients Only) Modified Rankin (Stroke Patients Only) Pre-Morbid Rankin Score: No symptoms Modified Rankin: Severe disability     Balance Overall balance  assessment: Needs assistance Sitting-balance support: Single extremity supported, No upper extremity supported, Feet supported Sitting balance-Leahy Scale: Poor Sitting balance - Comments: Pt leans to the R initially max A improved to mod A and then with fatigue returned to max A Postural control: Right lateral lean     Standing balance comment: Unable to safely test due to BP rising with sitting EOB, pt returned to supine due to BP remaining over BP parameters even with increase of IV BP meds during session                            Communication Communication Communication: Impaired Factors Affecting Communication: Difficulty expressing self (nonverbal this session)  Cognition Arousal: Alert Behavior During Therapy: Flat affect   PT - Cognitive impairments: Difficult to assess, Attention, Initiation, Sequencing, Problem solving, Safety/Judgement Difficult to assess due to: Impaired communication                     PT - Cognition Comments: Pt nonverbal throughout session, only answering questions via head nods/shakes with ~25% consistency and accuracy. Able to follows simple multi-modal cues inconsistently with L side, ~25% of time. Able to provide thumbs up on L 2x with little cuing today. Poor attention to R side. Visually tracks bil with max cuing. Pt smiled 2x when joking with him Following commands: Impaired Following commands impaired: Follows one step commands inconsistently, Follows one step commands with increased time (approx 25% of the time)    Cueing Cueing Techniques: Verbal cues, Gestural cues, Tactile cues, Visual cues  Exercises      General Comments General comments (skin integrity, edema, etc.): BP taken on RLE supine: 149/92 (105); seated 181/127 (142); seated after IV meds administered/increased 199/133; return supine 142/79      Pertinent Vitals/Pain Pain Assessment Pain Assessment: Faces Faces Pain Scale: No hurt Pain Intervention(s):  Monitored during session    Home Living                          Prior Function            PT Goals (current goals can now be found in the care plan section) Acute Rehab PT Goals Patient Stated Goal: unable to state; wife is hoping for pt to improve PT Goal Formulation: With patient/family Time For Goal Achievement: 06/21/24 Potential to Achieve Goals: Good Progress towards PT goals: Progressing toward goals    Frequency    Min 3X/week      PT Plan      Co-evaluation   Reason for Co-Treatment: Complexity of the patient's impairments (multi-system involvement);For patient/therapist safety;Necessary to address cognition/behavior during functional activity PT goals addressed during session: Balance;Strengthening/ROM;Mobility/safety with mobility OT goals addressed during session: ADL's and self-care;Strengthening/ROM (cognition/vision)      AM-PAC PT 6 Clicks Mobility   Outcome Measure  Help needed turning from your back to your side while in a flat bed without using bedrails?: Total Help needed moving from lying on your back to sitting on the side of a flat bed without using bedrails?: Total Help needed moving to and  from a bed to a chair (including a wheelchair)?: Total Help needed standing up from a chair using your arms (e.g., wheelchair or bedside chair)?: Total Help needed to walk in hospital room?: Total Help needed climbing 3-5 steps with a railing? : Total 6 Click Score: 6    End of Session Equipment Utilized During Treatment: Oxygen Activity Tolerance: Treatment limited secondary to medical complications (Comment) (limited by BP over goal) Patient left: in bed;with call bell/phone within reach;with bed alarm set;with restraints reapplied;with family/visitor present Nurse Communication: Mobility status;Other (comment) (BP) PT Visit Diagnosis: Muscle weakness (generalized) (M62.81);Difficulty in walking, not elsewhere classified (R26.2);Other  symptoms and signs involving the nervous system (R29.898);Hemiplegia and hemiparesis Hemiplegia - Right/Left: Right Hemiplegia - dominant/non-dominant: Dominant Hemiplegia - caused by: Other Nontraumatic intracranial hemorrhage (tumor)     Time: 0826-0902 PT Time Calculation (min) (ACUTE ONLY): 36 min  Charges:    $Therapeutic Activity: 8-22 mins PT General Charges $$ ACUTE PT VISIT: 1 Visit                     Theo Ferretti, PT, DPT Acute Rehabilitation Services  Office: [phone]    Theo CHRISTELLA Ferretti 06/08/2024, 2:17 PM

## 2024-06-08 NOTE — Progress Notes (Signed)
 Initial Nutrition Assessment  DOCUMENTATION CODES:   Morbid obesity  INTERVENTION:   Initiate tube feeding via Cortrak tube: Osmolite 1.5 at 25 ml/h and increase by 10 ml every 8 hours to goal rate of 55 ml/hr (1320 ml per day)  Prosource TF20 60 ml BID  Provides 2140 kcal, 122 gm protein, 1003 ml free water daily   200 ml free water eery 6 hours Total free water: 1803 ml   100 mg thiamine daily x 7 days  MVI with minerals daily   Monitor magnesium and phosphorus daily x 4 occurrences, MD to replete as needed, as pt is at risk for refeeding syndrome given pt with inadequate PO during 5 day admission.   NUTRITION DIAGNOSIS:   Inadequate oral intake related to inability to eat as evidenced by NPO status.  GOAL:   Patient will meet greater than or equal to 90% of their needs  MONITOR:   TF tolerance, Labs  REASON FOR ASSESSMENT:   Consult Enteral/tube feeding initiation and management  ASSESSMENT:   Pt with PMH of pulmonary HTN, HLD, OSA on CPAP at night, HF, COPD, and melanoma on immunotherapy (nivolumab ) admitted with R-sided weakness and aphasia, found to have L frontal ICH from hemorrhagic tumor s/p craniotomy.   Pt discussed during ICU rounds and with RN and MD.   Beatris with daughter who is at bedside and provides hx. Pt awake and fidgeting but unable to answer any questions. Per daughter pt was unable to walk for a time after surgery for melanoma, per daughter a large section of the bottom of his foot was removed due to melanoma but he has been walking for the last few months and not using his scooter. Pt started his immunotherapy last November (2024) and has not developed many side effects during treatment but she does acknowledge weight loss after looking back at pictures. She is unaware of how much but reports it was not significant.     9/27 - admit with L frontal ICH 9/28 - per MRI pt with hemorrhagic tumor 9/30 - s/p craniotomy for tumor resection 10/1 -  extubated 10/3 - s/p cortrak placement; tip gastric  Medications reviewed and include: decadron , prosource, free water, keppra, protonix, miralax, senokot-s Cleviprex @ 16 ml/hr provides: 768 kcal   Labs reviewed:  Na 160 K 3.9 Phos 3.0 Mag 2.9 A1C 5.5 CBG's: 142-157 (NPO)  UOP 1850 ml  NUTRITION - FOCUSED PHYSICAL EXAM:  Flowsheet Row Most Recent Value  Orbital Region No depletion  Upper Arm Region No depletion  Thoracic and Lumbar Region No depletion  Buccal Region Mild depletion  Temple Region No depletion  Clavicle Bone Region Moderate depletion  Clavicle and Acromion Bone Region Moderate depletion  Scapular Bone Region Unable to assess  Dorsal Hand Unable to assess  Patellar Region No depletion  Anterior Thigh Region No depletion  Posterior Calf Region No depletion  Edema (RD Assessment) None  Hair Reviewed  Eyes Reviewed  Mouth Unable to assess  Skin Reviewed  Nails Unable to assess    Diet Order:   Diet Order             Diet NPO time specified  Diet effective now                   EDUCATION NEEDS:   No education needs have been identified at this time  Skin:  Skin Assessment: Reviewed RN Assessment (head incision)  Last BM:  9/30  Height:   Ht  Readings from Last 1 Encounters:  06/05/24 5' 7 (1.702 m)    Weight:   Wt Readings from Last 1 Encounters:  06/08/24 (!) 140 kg    BMI:  Body mass index is 48.34 kg/m.  Estimated Nutritional Needs:   Kcal:  1900-2100  Protein:  110-125 grams  Fluid:  >1.9 L/day  Powell SQUIBB., RD, LDN, CNSC See AMiON for contact information

## 2024-06-08 NOTE — Progress Notes (Signed)
 OT Evaluation:  Clinical Impression: Pt is typically independent without DME and living with his wife in a multi-level house with a ramped entrance option. He is a retired Personnel officer. Today he is non-verbal throughout session, presents with deficits in cognition, vision, balance, R hemiplegia and inattention, decreased activity tolerance. Today he is max A +2 for all aspects of bed mobility to come EOB. While wife reports that earlier he moved his R arm no movement noted during eval. Grimace to noxious stimulus but no movement. He is able to bring LUE to face for grooming, but when asked to wash his face he did not attend to task and was ultimately max to total A for grooming, dressing, bathing. Pt also presents with decreased smoothness of horizontal tracking requiring max cues and some head turns to achieve. Vertical visual tracking seems better than horizontal at this time. While patient is non-verbal he did have 2 instances of smiling appropriately during therapy session today. Pt follows approx 25% commands at this time. Pt initially max A for seated balance with R lean which improved to mod A, then with fatigue returned to max A. Pt total A +2 to return supine due to elevated BP's outside of parameters despite IV medicine intervention administered by RN. (See general comments below) OT will continue to follow acutely and recommend post-acute rehab of >3 hours daily to maximize safety and independence in ADL and functional transfers. Next session continue to assess vision, R side movement, and cognition.    06/08/24 0909  OT Visit Information  Last OT Received On 06/08/24  Assistance Needed +2  PT/OT/SLP Co-Evaluation/Treatment Yes  Reason for Co-Treatment Complexity of the patient's impairments (multi-system involvement);For patient/therapist safety;Necessary to address cognition/behavior during functional activity  PT goals addressed during session Balance;Strengthening/ROM;Mobility/safety with  mobility  OT goals addressed during session ADL's and self-care;Strengthening/ROM (cognition/vision)  Reason Eval/Treat Not Completed Patient not medically ready (Sedation down, now waiting for extubation. OT to wait for post-extubation evaluation orders.)  History of Present Illness 68 y.o. male history of melanoma currently in the process of getting immunotherapy presented 06/02/24 with altered mental status, aphasia, and Rt sided weakness. Initial CT scan showing a left frontal hemorrhage. MRI showed underlying mass. S/p L frontal crani for tumor resection and evacuation of L frontal hematoma & transbronchial needle aspiration 9/30. Extubated 10/1. Questionable seizure activity noted during hospitalization. PMH-COPD, HF, cardiomyopathy, pulmonary HTN, OSA, melanoma on immunotherapy  Precautions  Precautions Fall;Other (comment)  Recall of Precautions/Restrictions Impaired  Precaution/Restrictions Comments watch HR, SBP < 160, bil mitts  Restrictions  Weight Bearing Restrictions Per Provider Order No  Home Living  Family/patient expects to be discharged to: Private residence  Living Arrangements Spouse/significant other  Available Help at Discharge Family;Available 24 hours/day  Type of Home House  Home Access Ramped entrance;Stairs to enter  Entrance Stairs-Number of Steps 1  Entrance Stairs-Rails None  Home Layout Able to live on main level with bedroom/bathroom;Multi-level  Bathroom Shower/Tub Walk-in shower;Tub/shower unit  Tour manager Other (comment);Wheelchair - power (knee scooter)   Lives With Spouse  Prior Function  Prior Level of Function  Independent/Modified Independent;Driving  Mobility Comments was on knee scooter until April due to melanoma wound on foot then once healed was IND without DME  ADLs Comments retired Personnel officer  Pain Assessment  Pain Assessment Faces  Faces Pain Scale 0  Pain Intervention(s) Monitored during  session;Repositioned  Cognition  Arousal Alert  Behavior During Therapy Flat affect  Cognition Cognition impaired  Attention impairment (select first level of impairment) Sustained attention  OT - Cognition Comments Pt aphasic, non-verbal, following approx 25% of commands. Difficult to assess cognition,  Following Commands  Following commands Impaired  Following commands impaired Follows one step commands inconsistently;Follows one step commands with increased time (approx 25% of the time)  Cueing  Cueing Techniques Verbal cues;Gestural cues;Tactile cues;Visual cues  Communication  Communication Impaired  Factors Affecting Communication Difficulty expressing self (nonverbal this session)  Upper Extremity Assessment  Upper Extremity Assessment Right hand dominant;RUE deficits/detail;LUE deficits/detail  RUE Deficits / Details no movement witnessed throughout session. no withdrawal to pain (face did grimace)  RUE Sensation WNL (grimaces to nail bed pressure)  RUE Coordination decreased fine motor;decreased gross motor (flaccid)  LUE Deficits / Details able to bring to face, decreased fine motor, generalized weakness grossly 4-/5  LUE Sensation WNL  LUE Coordination decreased fine motor  Lower Extremity Assessment  Lower Extremity Assessment Defer to PT evaluation  Cervical / Trunk Assessment  Cervical / Trunk Assessment Other exceptions  Cervical / Trunk Exceptions increased body habitus  Vision- History  Ability to See in Adequate Light 1 Impaired  Patient Visual Report  (unable to report. Pt with disconjugate gaze)  Vision- Assessment  Vision Assessment? Yes;Vision impaired- to be further tested in functional context  Eye Alignment Impaired (comment)  Ocular Range of Motion Impaired-to be further tested in functional context (better)  Alignment/Gaze Preference WDL  Tracking/Visual Pursuits Decreased smoothness of horizontal tracking;Requires cues, head turns, or add eye  shifts to track;Impaired - to be further tested in functional context  Diplopia Assessment Other (comment) (continue to assess)  Additional Comments disconjugate gaze, cognition and limited comminication impacting assessment. Will track in all quadrants, vertical is better than horizontal  ADL  Overall ADL's  Needs assistance/impaired  Eating/Feeding NPO  Grooming Wash/dry face;Maximal assistance;Sitting  Grooming Details (indicate cue type and reason) using L hand attempted, but unable to sustain to wash face  General ADL Comments currently total A for all aspects of ADL  Bed Mobility  Overal bed mobility Needs Assistance  Bed Mobility Supine to Sit;Sit to Supine  Supine to sit Max assist;HOB elevated;+2 for physical assistance;+2 for safety/equipment  Sit to supine Total assist;+2 for physical assistance;+2 for safety/equipment  General bed mobility comments Gestures provided along with verbal and tactile cues to come on and get up with pt initiating intermittently then stopping after brief activation. Pt needed maxA +2 to ascend trunk and pivot R hip towards L EOB to sit up with assist from bed pad. Total assist x2 helicopter tecnique needed to manage trunk and legs sit to supine and then scoot superiorly in bed with bed in trendelenburg position.  Transfers  General transfer comment Unable to safely test due to BP rising with sitting EOB, pt returned to supine due to BP remaining over BP parameters even with increase of IV BP meds during session  Balance  Overall balance assessment Needs assistance  Sitting-balance support Single extremity supported;No upper extremity supported;Feet supported  Sitting balance-Leahy Scale Poor  Sitting balance - Comments Pt leans to the R initially max A improved to mod A and then with fatigue returned to max A  Postural control Right lateral lean  Standing balance comment Unable to safely test due to BP rising with sitting EOB, pt returned to supine  due to BP remaining over BP parameters even with increase of IV BP meds during session  General Comments  General comments (skin integrity, edema, etc.)  BP taken on RLE supine: 149/92 (105); seated 181/127 (142); seated after IV meds administered/increased 199/133; return supine 142/79  OT - End of Session  Equipment Utilized During Treatment Oxygen (5L)  Activity Tolerance Treatment limited secondary to medical complications (Comment) (BP parameters)  Patient left in bed;with call bell/phone within reach;with bed alarm set;with family/visitor present (in [facility])  Nurse Communication Mobility status;Need for lift equipment;Precautions  OT Assessment  OT Recommendation/Assessment Patient needs continued OT Services  OT Visit Diagnosis Unsteadiness on feet (R26.81);Other abnormalities of gait and mobility (R26.89);Muscle weakness (generalized) (M62.81);Low vision, both eyes (H54.2);Other symptoms and signs involving the nervous system (R29.898);Other symptoms and signs involving cognitive function;Cognitive communication deficit (R41.841);Hemiplegia and hemiparesis  Symptoms and signs involving cognitive functions Other Nontraumatic ICH  Hemiplegia - Right/Left Right  Hemiplegia - dominant/non-dominant Dominant  Hemiplegia - caused by Other Nontraumatic intracranial hemorrhage  OT Problem List Decreased strength  OT Plan  OT Frequency (ACUTE ONLY) Min 2X/week  OT Treatment/Interventions (ACUTE ONLY) Self-care/ADL training  AM-PAC OT 6 Clicks Daily Activity Outcome Measure (Version 2)  Help from another person eating meals? 1  Help from another person taking care of personal grooming? 1  Help from another person toileting, which includes using toliet, bedpan, or urinal? 1  Help from another person bathing (including washing, rinsing, drying)? 1  Help from another person to put on and taking off regular upper body clothing? 1  Help from another person to put on and taking off regular lower  body clothing? 1  6 Click Score 6  Progressive Mobility  What is the highest level of mobility based on the mobility assessment? Level 1 (Bedfast) - Unable to balance while sitting on edge of bed  Mobility Referral No  Activity Dangled on edge of bed  OT Recommendation  Recommendations for Other Services PT consult;Speech consult  Follow Up Recommendations Acute inpatient rehab (3hours/day)  Patient can return home with the following Two people to help with walking and/or transfers;Two people to help with bathing/dressing/bathroom;Direct supervision/assist for medications management;Direct supervision/assist for financial management;Assist for transportation;Supervision due to cognitive status;Help with stairs or ramp for entrance  Functional Status Assessent Patient has had a recent decline in their functional status and demonstrates the ability to make significant improvements in function in a reasonable and predictable amount of time.  OT Equipment Other (comment) (defer to next venue)  Individuals Consulted  Consulted and Agree with Results and Recommendations Patient;Family member/caregiver  Family Member Consulted Wife Darice  Acute Rehab OT Goals  Patient Stated Goal get as independent as possible  OT Goal Formulation With family  Time For Goal Achievement 06/22/24  Potential to Achieve Goals Good  OT Time Calculation  OT Start Time (ACUTE ONLY) 0825  OT Stop Time (ACUTE ONLY) 0902  OT Time Calculation (min) 37 min  OT General Charges  $OT Visit 1 Visit  OT Evaluation  $OT Eval Moderate Complexity 1 Mod   Leita DEL OTR/L Acute Rehabilitation Services Office: [phone]

## 2024-06-08 NOTE — Progress Notes (Signed)
 Speech Language Pathology Treatment: Dysphagia;Cognitive-Linguistic  Patient Details Name: Justin Rasmussen MRN: [id number] DOB: 1955-12-20 Today's Date: 06/08/2024 Time: 0950-1003 SLP Time Calculation (min) (ACUTE ONLY): 13 min  Assessment / Plan / Recommendation Clinical Impression  Pt is alert but with heavy work of breathing, minimally responsive to commands and not very motivated to attempt PO trials. SLP engaged pt with social language, eliciting some nods and partial smiles, demonstrating significant right facial weakness. Pt unable to cough on command or phonate, appearing more from significant work of breathing and generalized weakness. PO trials limited to ice chip and small amount of water siphoned from straw (pt did not have oral strength to draw up water from straw). No immediate coughing but risk of aspiration currently high. Recommend continue NPO. 1 or 2 ice chips ok for comfort if pt interested. Has cortrak now for feeding.   HPI HPI: 68 y.o. male history of melanoma currently in the process of getting immunotherapy presented 06/02/24 with altered mental status, aphasia, and Rt sided weakness. Initial CT scan showing a left frontal hemorrhage. MRI showed underlying mass. S/p L frontal crani for tumor resection and evacuation of L frontal hematoma & transbronchial needle aspiration 9/30. Extubated 10/1. Questionable seizure activity noted during hospitalization. PMH-COPD, HF, cardiomyopathy, pulmonary HTN, OSA, melanoma on immunotherapy      SLP Plan  Continue with current plan of care          Recommendations  Diet recommendations: NPO Medication Administration: Via alternative means                  Oral care QID;Oral care prior to ice chip/H20   Frequent or constant Supervision/Assistance Aphasia (R47.01);Cognitive communication deficit (R41.841);Dysphagia, unspecified (R13.10)     Continue with current plan of care     Elimelech Houseman, Consuelo Fitch  06/08/2024,  10:13 AM

## 2024-06-08 NOTE — Progress Notes (Addendum)
 STROKE TEAM PROGRESS NOTE    SIGNIFICANT HOSPITAL EVENTS 9/27: Patient presented with right-sided weakness and aphasia, found to have left frontal ICH. 9/28: MRI reveals hemorrhagic tumor in the anterior left frontal lobe, neurosurgery consulted 9/30: Craniotomy for tumor resection 10/1: Extubated at 1318  INTERIM HISTORY/SUBJECTIVE Family at the bedside. He remains on cleviprex gtt. Coretrak has been placed today Coreg  has been started.  Neurological exam remains stable and unchanged with right side hemiparesis, can follow some commands    CBC    Component Value Date/Time   WBC 10.6 (H) 06/07/2024 0514   RBC 4.73 06/07/2024 0514   HGB 12.3 (L) 06/07/2024 0514   HCT 41.5 06/07/2024 0514   PLT 267 06/07/2024 0514   MCV 87.7 06/07/2024 0514   MCH 26.0 06/07/2024 0514   MCHC 29.6 (L) 06/07/2024 0514   RDW 16.4 (H) 06/07/2024 0514   LYMPHSABS 0.9 06/02/2024 1815   MONOABS 0.6 06/02/2024 1815   EOSABS 0.1 06/02/2024 1815   BASOSABS 0.0 06/02/2024 1815    BMET    Component Value Date/Time   NA 160 (H) 06/07/2024 2323   NA 141 11/10/2021 1022   K 3.9 06/07/2024 0514   CL 123 (H) 06/07/2024 0514   CO2 24 06/07/2024 0514   GLUCOSE 150 (H) 06/07/2024 0514   BUN 35 (H) 06/07/2024 0514   BUN 18 11/10/2021 1022   CREATININE 1.14 06/07/2024 0514   CALCIUM  8.4 (L) 06/07/2024 0514   EGFR 76 11/10/2021 1022   GFRNONAA >60 06/07/2024 0514    IMAGING past 24 hours No results found.  Vitals:   06/08/24 0630 06/08/24 0645 06/08/24 0802 06/08/24 1200  BP: (!) 145/80 (!) 161/95    Pulse: (!) 103 95    Resp: (!) 21 (!) 22    Temp:   99.1 F (37.3 C) 98.7 F (37.1 C)  TempSrc:   Axillary Axillary  SpO2: 93% 92%    Weight:      Height:         PHYSICAL EXAM General:  Alert, obese patient in no acute distress CV: Regular rate and rhythm on monitor Respiratory: normal on [place]      NEURO:  Eyes open spontaneously patient is responsive to voice.  Does not consistently follow  simple commands. No verbal output.    Cranial Nerves:  II: PERRL.  Blinking to visual threat bilaterally III, IV, VI: Eyelids elevate symmetrically, disconjugate R side gaze.  Left eye with disconjugate/downward gaze and increased swelling.  CN VI palsy VII: Right facial droop  VIII: hearing intact to voice. IX, X: Cough and gag reflex positive Motor/sensory: Spontaneous movement of left arm seen, lifts off bed holds against gravity  Withdrawal movement seen in all other extremities, less on right. Strong drift to RUE.  Tone: is normal and bulk is normal Sensation-unable to assess Coordination: FTN unable to perform d/t not following commands Gait- deferred   NIHSS 23   ASSESSMENT/PLAN [name] is a 68 y.o. male with history of melanoma on immunotherapy admitted for right-sided weakness and aphasia. He was found to have a left frontal ICH on CT, and MRI revealed a hemorrhagic tumor. NIH on Admission 8. S/p stereotactic craniotomy.    ICH: left frontal ICH, etiology: Hemorrhagic mass, likely from metastatic melanoma Code Stroke CT head hemorrhage in the anterior left frontal lobe with possible underlying mass and marked surrounding vasogenic edema with 9 mm of midline shift CTA head & neck no LVO, hemodynamically significant stenosis or aneurysm  MRI hemorrhagic tumor of the anterior left frontal lobe with 5 cm enhancing tumor with larger surrounding acute hemorrhage, moderate volume of the intraventricular blood, 12 mm midline shift Follow-up CT 9/28: Stable left frontal lobe hemorrhagic mass measuring up to 6 cm with associated vasogenic edema and midline shift of up to 11 mm. S/p hematoma evacuation 9/30 CT head 10/2 improved left frontal ICH and midline shift, bilateral SAH 2D Echo EF 60-65%, mildly dilated left ventricular internal cavity and mildly reduced right ventricular systolic function. LDL 93 HgbA1c 5.5 VTE prophylaxis - SCDs aspirin  81 mg daily prior to  admission, continue no antithrombotic due to ICH Therapy recommendations: Pending Disposition: Pending   Cerebral edema (brain compression) Cerebral edema seen on head CT and MRI with 12 mm of midline shift Decadron  initiated by neurosurgery for edema due to tumor, continues 4mg  Q6H 3% saline at 50 cc/h -> NS @ 50->0.45% saline- stopped  Gradually normalize sodium level Na 145->143->150->151->154->155->157->156->155->156->157-160   Hemorrhagic tumor, likely metastatic melanoma S/p crani with hematoma evacuation  Patient has history of melanoma of right foot, resected last year with positive lymph nodes, currently on immunotherapy Hemorrhagic tumor seen on MRI Continue keppra 1g bid EEG completed due to rhythmic lower jaw movement seen on exam 10/1, negative for seizures.  Neurosurgery consulted S/p stereotactic craniotomy 9/30 Post-op MRI shows decreased hematoma volume with no new metastatic disease identified.  Stable to mildly improved mass effect, stable IVH.  Possible increase of bilateral SAH noted on read.   Hypertension CHFrEF Home meds: Carvedilol  6.25 mg twice daily, Entresto  twice daily torsemide  20 mg as needed, Fraxiga  Stable on the high end Now on Cleviprex, titrate off as able Home coreg  and entresto  restarted today  Continue IV metoprolol 5mg  Q6H BP goal less than 160 Long-term BP goal normotensive   Hyperlipidemia Home meds: Rosuvastatin  10 mg daily LDL 93, goal < 70 Resume statin at discharge and consider increase to 20 mg   Acute hypoxic/hypercapnic respiratory failure due to bilateral lower lobe atelectasis  Continue titrate nasal cannula oxygen with O2 sat goal 88-92% Encourage incentive spirometry Continue as needed albuterol    Tobacco use Former cigarette smoker Quit in 1990   Dysphagia Currently n.p.o.,  IV fluid Speech on board core Trak today, TF started    Other Stroke Risk Factors Obesity, Body mass index is 47.68 kg/m., BMI >/= 30  associated with increased stroke risk, recommend weight loss, diet and exercise as appropriate  OSA on CPAP   Other Active Problems Pulmonary hypertension CKD 3a, creatinine 1.26-1.43-1.28-1.35-1.31-1.14  Hospital day # 6   Karna Geralds DNP, ACNPC-AG  [facility] I have personally obtained history,examined this patient, reviewed notes, independently viewed imaging studies, participated in medical decision making and plan of care.ROS completed by me personally and pertinent positives fully documented  I have made any additions or clarifications directly to the above note. Agree with note above.  Patient is sitting up in bed.  Continues to have mild aphasia and right-sided weakness.  Brain biopsy results pending.  Long discussion with patient's wife at the bedside and answered her questions.  Discussed with Dr. Harold critical care medicine.  Transfer to critical care service.  Stroke team will sign off.  Kindly call for questions.   I personally spent a total of 50 minutes in the care of the patient today including getting/reviewing separately obtained history, performing a medically appropriate exam/evaluation, counseling and educating, placing orders, referring and communicating with other health care professionals, documenting clinical  information in the EHR, independently interpreting results, and coordinating care.          Eather Popp, MD Medical Director [facility] Pager: [phone] 06/08/2024 3:31 PM    To contact Stroke Continuity provider, please refer to [email or web address]. After hours, contact General Neurology

## 2024-06-08 NOTE — Progress Notes (Signed)
 eLink Physician-Brief Progress Note Patient Name: Justin Rasmussen DOB: 1956-01-30 MRN: [id number]   Date of Service  06/08/2024  HPI/Events of Note  Postop day 4, okay to transfer out per neurosurgery once off drips Stable neurological status, request to de-escalate neurochecks  eICU Interventions  Every 4 hours neurochecks     Intervention Category Minor Interventions: Routine modifications to care plan (e.g. PRN medications for pain, fever)  Yilia Sacca 06/08/2024, 9:46 PM

## 2024-06-08 NOTE — Progress Notes (Signed)
  NEUROSURGERY PROGRESS NOTE   Pt seen and examined. No issues overnight. Working with PT/OT this am.  EXAM: Temp:  [97.9 F (36.6 C)-100 F (37.8 C)] 99.1 F (37.3 C) (10/03 0802) Pulse Rate:  [81-124] 95 (10/03 0645) Resp:  [16-40] 22 (10/03 0645) BP: (131-176)/(69-132) 161/95 (10/03 0645) SpO2:  [91 %-96 %] 92 % (10/03 0645) Arterial Line BP: (147-166)/(64-69) 166/69 (10/02 1015) Weight:  [140 kg] 140 kg (10/03 0500) Intake/Output      10/02 0701 10/03 0700 10/03 0701 10/04 0700   I.V. (mL/kg) 1327.3 (9.5)    IV Piggyback 15    Total Intake(mL/kg) 1342.3 (9.6)    Urine (mL/kg/hr) 1850 (0.6)    Emesis/NG output     Total Output 1850    Net -507.7           Awake, alert Follows commands easily on left Not speaking to me today Follows commands LUE/LLE Intermittently can FC RUE/RLE with antigravity strength Wound telfa dressing in place  LABS: Lab Results  Component Value Date   CREATININE 1.14 06/07/2024   BUN 35 (H) 06/07/2024   NA 160 (H) 06/07/2024   K 3.9 06/07/2024   CL 123 (H) 06/07/2024   CO2 24 06/07/2024   Lab Results  Component Value Date   WBC 10.6 (H) 06/07/2024   HGB 12.3 (L) 06/07/2024   HCT 41.5 06/07/2024   MCV 87.7 06/07/2024   PLT 267 06/07/2024    IMPRESSION: - 68 y.o. male POD#4 left frontal crani for resection of met, evac of hematoma, progressing slowly.  PLAN: - Cont supportive care - Cont PT/OT/SLP. Suspect he will need CIR v SNF post-acute care - Cont steroids, will need longer-term dex upon d/c (2mg  BID) - Cont Keppra - Stable for transfer to stepdown when off IV meds, per PCCM.   Gerldine Maizes, MD [facility]

## 2024-06-08 NOTE — Progress Notes (Signed)
   06/08/24 2045  BiPAP/CPAP/SIPAP  BiPAP/CPAP/SIPAP Pt Type Adult  BiPAP/CPAP/SIPAP Resmed  Reason BIPAP/CPAP not in use Other(comment) (CPAP held d/t sutures on head)  Patient Home Machine Yes  Safety Check Completed by RT for Home Unit Yes, no issues noted  Patient Home Mask Yes  Patient Home Tubing Yes  Device Plugged into RED Power Outlet Yes

## 2024-06-08 NOTE — Procedures (Signed)
 Cortrak  Person Inserting Tube:  Katrinka Millman D, RD Tube Type:  Cortrak - 43 inches Tube Size:  10 Tube Location:  Left nare Secured by: Bridle Initial Placement:  Gastric Technique Used to Measure Tube Placement:  Marking at nare/corner of mouth Cortrak Secured At:  66 cm Initial Placement Verification:  Cortrak device (Registered Dieticians Only) Procedure Comments:  Cortrak Tube Team Note:  Consult received to place a Cortrak feeding tube.   No x-ray is required. RN may begin using tube.   If the tube becomes dislodged please keep the tube and contact the Cortrak team at [email or web address] for replacement.  If after hours and replacement cannot be delayed, place a NG tube and confirm placement with an abdominal x-ray.    Millman Katrinka, RD, LDN, CNSC Registered Dietitian II Please reach out via secure chat

## 2024-06-08 NOTE — Progress Notes (Addendum)
 NAMESailor Rasmussen, MRN:  [id number], DOB:  12-05-1955, LOS: 6 ADMISSION DATE:  06/02/2024, CONSULTATION DATE:  9/27 REFERRING MD:  Michaela, neuro CHIEF COMPLAINT: ICH   History of Present Illness:  68 year old male with past medical history of pulmonary hypertension, hyperlipidemia, OSA on CPAP at night, HFrEF, COPD, melanoma currently on nivolumab  presented to the ED this evening with right-sided weakness and aphasia. LKW around 9 AM.  Wife in the emergency department reported that around noon he seemed tired and not speaking much.  Also noted to be a little confused.  On arrival to ED, noted to have right-sided weakness, aphasia and code stroke was activated.  CT head with left frontal lobe 4 x 6 x 4 cm hemorrhage with concern for underlying mass, vasogenic edema and 9 mm midline shift.  CT angio with no LVO.  Labs with hemoglobin 12.3, sodium 140, creatinine 1.26, ethanol negative, glucose normal.  He was evaluated by neurology who admitted him to ICU.  CCM with cross coverage consult.  Pertinent  Medical History  pulmonary hypertension, hyperlipidemia, OSA on CPAP at night, HFrEF, COPD, melanoma currently on nivolumab   Past Medical History:  Diagnosis Date   2019 novel coronavirus disease (COVID-19) 07/2019   COPD (chronic obstructive pulmonary disease) (HCC)    HFrEF (heart failure with reduced ejection fraction) (HCC)    Melanoma (HCC)    Nonischemic cardiomyopathy (HCC)    OSA (obstructive sleep apnea)    Pulmonary hypertension (HCC)    WHO group II    Significant Hospital Events: Including procedures, antibiotic start and stop dates in addition to other pertinent events   9/27: Admit to ICU s/p code stroke with left frontal lobe ICH and concern for mass 9/30: Craniotomy with tumor resection and hematoma evacuation 10/1: Extubated 10/2: Unable to swallow plans for Cortrak tomorrow  Interim History / Subjective:  No overnight issues Patient has weak cough Remained  afebrile Currently on 5 L nasal cannula oxygen, used CPAP overnight  Objective   Blood pressure (!) 161/95, pulse 95, temperature 100 F (37.8 C), temperature source Axillary, resp. rate (!) 22, height 5' 7 (1.702 m), weight (!) 140 kg, SpO2 92%.        Intake/Output Summary (Last 24 hours) at 06/08/2024 0742 Last data filed at 06/08/2024 0600 Gross per 24 hour  Intake 1342.26 ml  Output 1850 ml  Net -507.74 ml   Filed Weights   06/05/24 0945 06/07/24 0500 06/08/24 0500  Weight: (!) 137 kg (!) 140.6 kg (!) 140 kg    Examination: General: Acutely ill-appearing morbidly obese elderly male, lying on the bed HEENT: [place]/AT, eyes anicteric.  moist mucus membranes.  Periorbital edema noted right more than left.  Status post left frontal craniotomy Neuro: Awake, aphasic, following commands, antigravity in all 4 extremities, right is weaker than left Chest: Coarse breath sounds, no wheezes or rhonchi Heart: Regular rate and rhythm, no murmurs or gallops Abdomen: Soft, nontender, nondistended, bowel sounds present  Labs and images reviewed  Resolved Hospital Problem list    Assessment & Plan:  Acute left frontal intraparenchymal hemorrhage, likely due to hemorrhagic tumor Cerebral (Vasogenic) edema with brain compression Single 2.5 cm enhancing tumor with surrounding hemorrhage.  No evidence of other metastasis S/p craniotomy with hematoma evacuation on 06/05/2024 Induced hypernatremia Melanoma on immunotherapy Stroke team and neurosurgery are following Avoid antiplatelet and anticoagulation Maintain SBP 130-150 MRI brain 9/28 - showing 2.5 cm enhancing tumor with surrounding hemorrhage with 12 mm midline shift and 83  cc total of blood increased from prior imaging. MRI brain postop ON 9/30 -decreased left frontal lobe hematoma postcraniotomy resection of the underlying mass no new metastatic disease. Failed swallow evaluation, scheduled for cortrak today Off hypertonic saline Serum  sodium is still ranging between 157-160 Once he gets cotrak will start free water flushes Continue dexamethasone  Discontinue half-normal saline Path report for hemorrhagic mass is consistent with melanoma  Hypertension, uncontrolled Still requiring clevidipine infusion, titrate with SBP goal 130-150 Once he has oral access, will start him on oral meds  Acute kidney injury, likely due to dehydration Patient baseline serum creatinine is around 0.9, it trended up to 1.3 Now serum creatinine is trending down, closely monitor Avoid nephrotoxic agent   COPD, not in exacerbation OSA on CPAP at night  Morbid obesity Pulmonary hypertension, previously on home O2, now prn  Acute hypoxic/hypercapnic respiratory failure due to bilateral lower lobe atelectasis Chronic right hemidiaphragm elevation Continue as needed albuterol  CPAP at night Continue titrate nasal cannula oxygen with O2 sat goal 88-92% Encourage incentive spirometry Diet and exercise counseling as appropriate  Chronic right ventricular HFrEF in the setting of pulmonary hypertension Hyperlipidemia  Monitor intake and output Will restart home meds after oral access Echocardiogram showed normal EF with mild RV dysfunction   Prediabetes On Farxiga  at home  A1c is 5.5. Sliding scale insulin with CBG goal 140-180   Labs   CBC: Recent Labs  Lab 06/02/24 1815 06/02/24 1818 06/03/24 0551 06/04/24 0606 06/05/24 0514 06/05/24 1154 06/05/24 1427 06/05/24 1511 06/06/24 0415 06/07/24 0514  WBC 8.7  --  10.4 13.9* 13.8*  --   --   --  10.3 10.6*  NEUTROABS 7.0  --   --   --   --   --   --   --   --   --   HGB 12.3*   < > 12.7* 11.8* 11.8* 10.2* 11.9* 10.5* 10.9* 12.3*  HCT 39.9   < > 40.0 37.6* 38.2* 30.0* 35.0* 31.0* 36.0* 41.5  MCV 83.5  --  83.5 84.7 85.8  --   --   --  87.4 87.7  PLT 342  --  322 289 326  --   --   --  269 267   < > = values in this interval not displayed.    Basic Metabolic Panel: Recent Labs   Lab 06/03/24 0551 06/03/24 1111 06/04/24 0606 06/04/24 1037 06/05/24 0514 06/05/24 1154 06/05/24 1427 06/05/24 1511 06/05/24 1536 06/06/24 0415 06/06/24 2308 06/07/24 0514 06/07/24 1221 06/07/24 2323  NA 141   < > 150*   < > 155* 159* 158* 157*   < > 155* 156* 157* 157* 160*  K 3.8  --  3.9  --  4.2 4.0 4.5 4.1  --  3.9  --  3.9  --   --   CL 103  --  111  --  120*  --   --   --   --  121*  --  123*  --   --   CO2 26  --  28  --  26  --   --   --   --  23  --  24  --   --   GLUCOSE 168*  --  151*  --  146*  --   --   --   --  144*  --  150*  --   --   BUN 20  --  28*  --  34*  --   --   --   --  37*  --  35*  --   --   CREATININE 1.43*  --  1.28*  --  1.35*  --   --   --   --  1.31*  --  1.14  --   --   CALCIUM  8.7*  --  8.6*  --  8.4*  --   --   --   --  8.2*  --  8.4*  --   --   MG  --   --   --   --   --   --   --   --   --   --   --   --  2.9*  --   PHOS  --   --   --   --   --   --   --   --   --   --   --   --  3.0  --    < > = values in this interval not displayed.   GFR: Estimated Creatinine Clearance: 86.3 mL/min (by C-G formula based on SCr of 1.14 mg/dL). Recent Labs  Lab 06/04/24 0606 06/05/24 0514 06/06/24 0415 06/07/24 0514  WBC 13.9* 13.8* 10.3 10.6*    Liver Function Tests: Recent Labs  Lab 06/02/24 1815  AST 21  ALT 13  ALKPHOS 46  BILITOT 0.7  PROT 7.1  ALBUMIN 3.5   No results for input(s): LIPASE, AMYLASE in the last 168 hours. No results for input(s): AMMONIA in the last 168 hours.  ABG    Component Value Date/Time   PHART 7.327 (L) 06/05/2024 1511   PCO2ART 53.5 (H) 06/05/2024 1511   PO2ART 256 (H) 06/05/2024 1511   HCO3 28.0 06/05/2024 1511   TCO2 30 06/05/2024 1511   O2SAT 100 06/05/2024 1511     Coagulation Profile: Recent Labs  Lab 06/02/24 1815  INR 1.2    Cardiac Enzymes: No results for input(s): CKTOTAL, CKMB, CKMBINDEX, TROPONINI in the last 168 hours.  HbA1C: Hgb A1c MFr Bld  Date/Time Value Ref  Range Status  06/02/2024 10:35 PM 5.5 4.8 - 5.6 % Final    Comment:    (NOTE) Diagnosis of Diabetes The following HbA1c ranges recommended by the American Diabetes Association (ADA) may be used as an aid in the diagnosis of diabetes mellitus.  Hemoglobin             Suggested A1C NGSP%              Diagnosis  <5.7                   Non Diabetic  5.7-6.4                Pre-Diabetic  >6.4                   Diabetic  <7.0                   Glycemic control for                       adults with diabetes.    11/10/2021 10:22 AM 5.9 (H) 4.8 - 5.6 % Final    Comment:             Prediabetes: 5.7 - 6.4          Diabetes: >6.4  Glycemic control for adults with diabetes: <7.0     CBG: Recent Labs  Lab 06/07/24 1545 06/07/24 1908 06/07/24 2312 06/08/24 0311 06/08/24 0733  GLUCAP 156* 149* 157* 142* 148*   The patient is critically ill due to left frontal intraparenchymal hemorrhage/hypertensive requiring titration of IV antihypertensive meds.  Critical care was necessary to treat or prevent imminent or life-threatening deterioration.  Critical care was time spent personally by me on the following activities: development of treatment plan with patient and/or surrogate as well as nursing, discussions with consultants, evaluation of patient's response to treatment, examination of patient, obtaining history from patient or surrogate, ordering and performing treatments and interventions, ordering and review of laboratory studies, ordering and review of radiographic studies, pulse oximetry, re-evaluation of patient's condition and participation in multidisciplinary rounds.   During this encounter critical care time was devoted to patient care services described in this note for 40 minutes.     Valinda Novas, MD [place] [facility] See Amion for pager If no response to pager, please call [phone] until 7pm After 7pm, Please call E-link [phone]

## 2024-06-09 ENCOUNTER — Other Ambulatory Visit: Payer: Self-pay

## 2024-06-09 ENCOUNTER — Inpatient Hospital Stay (HOSPITAL_COMMUNITY)

## 2024-06-09 DIAGNOSIS — N179 Acute kidney failure, unspecified: Secondary | ICD-10-CM | POA: Diagnosis not present

## 2024-06-09 DIAGNOSIS — E870 Hyperosmolality and hypernatremia: Secondary | ICD-10-CM | POA: Diagnosis not present

## 2024-06-09 DIAGNOSIS — C7931 Secondary malignant neoplasm of brain: Secondary | ICD-10-CM | POA: Diagnosis not present

## 2024-06-09 DIAGNOSIS — J9602 Acute respiratory failure with hypercapnia: Secondary | ICD-10-CM

## 2024-06-09 DIAGNOSIS — I629 Nontraumatic intracranial hemorrhage, unspecified: Secondary | ICD-10-CM | POA: Diagnosis not present

## 2024-06-09 LAB — CBC WITH DIFFERENTIAL/PLATELET
Abs Immature Granulocytes: 0.18 K/uL — ABNORMAL HIGH (ref 0.00–0.07)
Basophils Absolute: 0 K/uL (ref 0.0–0.1)
Basophils Relative: 0 %
Eosinophils Absolute: 0 K/uL (ref 0.0–0.5)
Eosinophils Relative: 0 %
HCT: 45.7 % (ref 39.0–52.0)
Hemoglobin: 13.9 g/dL (ref 13.0–17.0)
Immature Granulocytes: 2 %
Lymphocytes Relative: 6 %
Lymphs Abs: 0.7 K/uL (ref 0.7–4.0)
MCH: 26.3 pg (ref 26.0–34.0)
MCHC: 30.4 g/dL (ref 30.0–36.0)
MCV: 86.4 fL (ref 80.0–100.0)
Monocytes Absolute: 0.9 K/uL (ref 0.1–1.0)
Monocytes Relative: 8 %
Neutro Abs: 9.9 K/uL — ABNORMAL HIGH (ref 1.7–7.7)
Neutrophils Relative %: 84 %
Platelets: 262 K/uL (ref 150–400)
RBC: 5.29 MIL/uL (ref 4.22–5.81)
RDW: 16.7 % — ABNORMAL HIGH (ref 11.5–15.5)
WBC: 11.7 K/uL — ABNORMAL HIGH (ref 4.0–10.5)
nRBC: 0 % (ref 0.0–0.2)

## 2024-06-09 LAB — GLUCOSE, CAPILLARY
Glucose-Capillary: 147 mg/dL — ABNORMAL HIGH (ref 70–99)
Glucose-Capillary: 163 mg/dL — ABNORMAL HIGH (ref 70–99)
Glucose-Capillary: 182 mg/dL — ABNORMAL HIGH (ref 70–99)
Glucose-Capillary: 184 mg/dL — ABNORMAL HIGH (ref 70–99)
Glucose-Capillary: 185 mg/dL — ABNORMAL HIGH (ref 70–99)
Glucose-Capillary: 193 mg/dL — ABNORMAL HIGH (ref 70–99)

## 2024-06-09 LAB — BASIC METABOLIC PANEL WITH GFR
Anion gap: 9 (ref 5–15)
BUN: 53 mg/dL — ABNORMAL HIGH (ref 8–23)
CO2: 26 mmol/L (ref 22–32)
Calcium: 8.4 mg/dL — ABNORMAL LOW (ref 8.9–10.3)
Chloride: 124 mmol/L — ABNORMAL HIGH (ref 98–111)
Creatinine, Ser: 1.2 mg/dL (ref 0.61–1.24)
GFR, Estimated: >60 mL/min (ref >60)
Glucose, Bld: 170 mg/dL — ABNORMAL HIGH (ref 70–99)
Potassium: 4.6 mmol/L (ref 3.5–5.1)
Sodium: 159 mmol/L — ABNORMAL HIGH (ref 135–145)

## 2024-06-09 LAB — PHOSPHORUS: Phosphorus: 4.3 mg/dL (ref 2.5–4.6)

## 2024-06-09 LAB — MAGNESIUM: Magnesium: 3.2 mg/dL — ABNORMAL HIGH (ref 1.7–2.4)

## 2024-06-09 MED ORDER — FUROSEMIDE 10 MG/ML IJ SOLN
40.0000 mg | Freq: Every day | INTRAMUSCULAR | Status: DC
  Administered 2024-06-09 – 2024-06-10 (×2): 40 mg via INTRAVENOUS
  Filled 2024-06-09 (×2): qty 4

## 2024-06-09 NOTE — Progress Notes (Signed)
 NAMEJosemiguel Rasmussen, MRN:  [id number], DOB:  [birth year], LOS: 7 ADMISSION DATE:  06/02/2024, CONSULTATION DATE:  9/27 REFERRING MD:  Michaela, neuro CHIEF COMPLAINT: ICH   History of Present Illness:  68 year old male with past medical history of pulmonary hypertension, hyperlipidemia, OSA on CPAP at night, HFrEF, COPD, melanoma currently on nivolumab  presented to the ED this evening with right-sided weakness and aphasia. LKW around 9 AM.  Wife in the emergency department reported that around noon he seemed tired and not speaking much.  Also noted to be a little confused.  On arrival to ED, noted to have right-sided weakness, aphasia and code stroke was activated.  CT head with left frontal lobe 4 x 6 x 4 cm hemorrhage with concern for underlying mass, vasogenic edema and 9 mm midline shift.  CT angio with no LVO.  Labs with hemoglobin 12.3, sodium 140, creatinine 1.26, ethanol negative, glucose normal.  He was evaluated by neurology who admitted him to ICU.  CCM with cross coverage consult.  Pertinent  Medical History  pulmonary hypertension, hyperlipidemia, OSA on CPAP at night, HFrEF, COPD, melanoma currently on nivolumab   Past Medical History:  Diagnosis Date   2019 novel coronavirus disease (COVID-19) 07/2019   COPD (chronic obstructive pulmonary disease) (HCC)    HFrEF (heart failure with reduced ejection fraction) (HCC)    Melanoma (HCC)    Nonischemic cardiomyopathy (HCC)    OSA (obstructive sleep apnea)    Pulmonary hypertension (HCC)    WHO group II    Significant Hospital Events: Including procedures, antibiotic start and stop dates in addition to other pertinent events   9/27: Admit to ICU s/p code stroke with left frontal lobe ICH and concern for mass 9/30: Craniotomy with tumor resection and hematoma evacuation 10/1: Extubated 10/2: Unable to swallow plans for Cortrak tomorrow 10/3: Remain on 5 L nasal cannula oxygen, generalized weak, afebrile  Interim History /  Subjective:  No overnight issues Remain afebrile  Objective   Blood pressure 129/80, pulse 92, temperature 98.8 F (37.1 C), temperature source Axillary, resp. rate (!) 21, height 5' 7 (1.702 m), weight (!) 140 kg, SpO2 92%.        Intake/Output Summary (Last 24 hours) at 06/09/2024 0747 Last data filed at 06/09/2024 0708 Gross per 24 hour  Intake 1736.05 ml  Output 3525 ml  Net -1788.95 ml   Filed Weights   06/05/24 0945 06/07/24 0500 06/08/24 0500  Weight: (!) 137 kg (!) 140.6 kg (!) 140 kg    Examination: General: Acute on chronically ill-appearing male, lying on the bed HEENT: [place]/AT, eyes anicteric.  moist mucus membranes Neuro: Opens eyes to verbal stimuli, following simple commands, aphasic, antigravity in all 4 extremities but weaker on right Chest: Reduced air entry at the bases bilaterally, no wheezes or rhonchi Heart: Regular rate and rhythm, no murmurs or gallops Abdomen: Soft, nontender, nondistended, bowel sounds present  Labs and x-ray chest reviewed  Patient Lines/Drains/Airways Status     Active Line/Drains/Airways     Name Placement date Placement time Site Days   Peripheral IV 06/05/24 18 G 1.75 Left Antecubital 06/05/24  1125  Antecubital  4   Peripheral IV 06/06/24 22 G 1.75 Anterior;Right Forearm 06/06/24  2257  Forearm  3   External Urinary Catheter 06/06/24  1815  --  3   Small Bore Feeding Tube 10 Fr. Left nare Marking at nare/corner of mouth 66 cm 06/08/24  0947  Left nare  1   Wound 06/05/24  1331 Surgical Closed Surgical Incision Head 06/05/24  1331  Head  4         Resolved Hospital Problem list    Assessment & Plan:  Acute left frontal intraparenchymal hemorrhage due to hemorrhagic tumor Cerebral (Vasogenic) edema with brain compression Single 2.5 cm enhancing tumor (melanoma) with surrounding hemorrhage.  No evidence of other metastasis S/p craniotomy with hematoma evacuation on 06/05/2024 Induced hypernatremia Melanoma on  immunotherapy Dysphagia Continue to watch every 4 hour Avoid antiplatelet and anticoagulation Maintain SBP 130-150, off clevidipine On home medication including Coreg  and Entresto  MRI brain 9/28 - showing 2.5 cm enhancing tumor with surrounding hemorrhage with 12 mm midline shift and 83 cc total of blood increased from prior imaging. MRI brain postop ON 9/30 -decreased left frontal lobe hematoma postcraniotomy resection of the underlying mass no new metastatic disease. Cortrak was placed yesterday, started on tube feeds and oral meds Repeat serum sodium is 159 Continue free water flushes 200 mL/h Continue dexamethasone , per neurosurgery patient will require long-term course with 2 mg twice daily Path report for hemorrhagic mass is consistent with melanoma  Hypertension, controlled Patient blood pressure is well-controlled with Coreg  and Entresto  Clevidipine is off now   Acute kidney injury, likely due to dehydration Patient baseline serum creatinine is around 0.9, it trended up to 1.3 Today serum creatinine is 1.2 Avoid nephrotoxic agent   COPD, not in exacerbation OSA on CPAP at night  Morbid obesity Pulmonary hypertension, previously on home O2, now prn  Acute hypoxic/hypercapnic respiratory failure due to bilateral lower lobe atelectasis Chronic right hemidiaphragm elevation Continue as needed albuterol  CPAP at night scheduled Continue titrate nasal cannula oxygen with O2 sat goal 88-92% Encourage incentive spirometry and ambulation, working with PT Diet and exercise counseling as appropriate  Chronic right ventricular HFrEF in the setting of pulmonary hypertension Hyperlipidemia  Monitor intake and output Echocardiogram showed normal EF with mild RV dysfunction  Continue Lasix  40 mg daily, patient takes torsemide  20 mg daily at home  Prediabetes On Farxiga  at home, currently on hold A1c is 5.5. Continue sliding scale insulin with CBG goal 140-180   Labs    CBC: Recent Labs  Lab 06/02/24 1815 06/02/24 1818 06/03/24 0551 06/04/24 0606 06/05/24 0514 06/05/24 1154 06/05/24 1427 06/05/24 1511 06/06/24 0415 06/07/24 0514  WBC 8.7  --  10.4 13.9* 13.8*  --   --   --  10.3 10.6*  NEUTROABS 7.0  --   --   --   --   --   --   --   --   --   HGB 12.3*   < > 12.7* 11.8* 11.8* 10.2* 11.9* 10.5* 10.9* 12.3*  HCT 39.9   < > 40.0 37.6* 38.2* 30.0* 35.0* 31.0* 36.0* 41.5  MCV 83.5  --  83.5 84.7 85.8  --   --   --  87.4 87.7  PLT 342  --  322 289 326  --   --   --  269 267   < > = values in this interval not displayed.    Basic Metabolic Panel: Recent Labs  Lab 06/03/24 0551 06/03/24 1111 06/04/24 0606 06/04/24 1037 06/05/24 0514 06/05/24 1154 06/05/24 1427 06/05/24 1511 06/05/24 1536 06/06/24 0415 06/06/24 2308 06/07/24 0514 06/07/24 1221 06/07/24 2323  NA 141   < > 150*   < > 155* 159* 158* 157*   < > 155* 156* 157* 157* 160*  K 3.8  --  3.9  --  4.2 4.0  4.5 4.1  --  3.9  --  3.9  --   --   CL 103  --  111  --  120*  --   --   --   --  121*  --  123*  --   --   CO2 26  --  28  --  26  --   --   --   --  23  --  24  --   --   GLUCOSE 168*  --  151*  --  146*  --   --   --   --  144*  --  150*  --   --   BUN 20  --  28*  --  34*  --   --   --   --  37*  --  35*  --   --   CREATININE 1.43*  --  1.28*  --  1.35*  --   --   --   --  1.31*  --  1.14  --   --   CALCIUM  8.7*  --  8.6*  --  8.4*  --   --   --   --  8.2*  --  8.4*  --   --   MG  --   --   --   --   --   --   --   --   --   --   --   --  2.9*  --   PHOS  --   --   --   --   --   --   --   --   --   --   --   --  3.0  --    < > = values in this interval not displayed.   GFR: Estimated Creatinine Clearance: 86.3 mL/min (by C-G formula based on SCr of 1.14 mg/dL). Recent Labs  Lab 06/04/24 0606 06/05/24 0514 06/06/24 0415 06/07/24 0514  WBC 13.9* 13.8* 10.3 10.6*    Liver Function Tests: Recent Labs  Lab 06/02/24 1815  AST 21  ALT 13  ALKPHOS 46  BILITOT 0.7   PROT 7.1  ALBUMIN 3.5   No results for input(s): LIPASE, AMYLASE in the last 168 hours. No results for input(s): AMMONIA in the last 168 hours.  ABG    Component Value Date/Time   PHART 7.327 (L) 06/05/2024 1511   PCO2ART 53.5 (H) 06/05/2024 1511   PO2ART 256 (H) 06/05/2024 1511   HCO3 28.0 06/05/2024 1511   TCO2 30 06/05/2024 1511   O2SAT 100 06/05/2024 1511     Coagulation Profile: Recent Labs  Lab 06/02/24 1815  INR 1.2    Cardiac Enzymes: No results for input(s): CKTOTAL, CKMB, CKMBINDEX, TROPONINI in the last 168 hours.  HbA1C: Hgb A1c MFr Bld  Date/Time Value Ref Range Status  06/02/2024 10:35 PM 5.5 4.8 - 5.6 % Final    Comment:    (NOTE) Diagnosis of Diabetes The following HbA1c ranges recommended by the American Diabetes Association (ADA) may be used as an aid in the diagnosis of diabetes mellitus.  Hemoglobin             Suggested A1C NGSP%              Diagnosis  <5.7                   Non Diabetic  5.7-6.4  Pre-Diabetic  >6.4                   Diabetic  <7.0                   Glycemic control for                       adults with diabetes.    11/10/2021 10:22 AM 5.9 (H) 4.8 - 5.6 % Final    Comment:             Prediabetes: 5.7 - 6.4          Diabetes: >6.4          Glycemic control for adults with diabetes: <7.0     CBG: Recent Labs  Lab 06/08/24 1140 06/08/24 1619 06/08/24 1915 06/08/24 2315 06/09/24 0308  GLUCAP 151* 175* 191* 169* 147*       Valinda Novas, MD [facility] See Amion for pager If no response to pager, please call [phone] until 7pm After 7pm, Please call E-link [phone]

## 2024-06-10 ENCOUNTER — Inpatient Hospital Stay (HOSPITAL_COMMUNITY)

## 2024-06-10 DIAGNOSIS — E870 Hyperosmolality and hypernatremia: Secondary | ICD-10-CM | POA: Insufficient documentation

## 2024-06-10 DIAGNOSIS — I629 Nontraumatic intracranial hemorrhage, unspecified: Secondary | ICD-10-CM | POA: Diagnosis not present

## 2024-06-10 DIAGNOSIS — C7931 Secondary malignant neoplasm of brain: Secondary | ICD-10-CM | POA: Diagnosis not present

## 2024-06-10 DIAGNOSIS — I5032 Chronic diastolic (congestive) heart failure: Secondary | ICD-10-CM | POA: Insufficient documentation

## 2024-06-10 DIAGNOSIS — R7303 Prediabetes: Secondary | ICD-10-CM | POA: Insufficient documentation

## 2024-06-10 DIAGNOSIS — J449 Chronic obstructive pulmonary disease, unspecified: Secondary | ICD-10-CM | POA: Insufficient documentation

## 2024-06-10 DIAGNOSIS — N179 Acute kidney failure, unspecified: Secondary | ICD-10-CM | POA: Insufficient documentation

## 2024-06-10 DIAGNOSIS — I609 Nontraumatic subarachnoid hemorrhage, unspecified: Secondary | ICD-10-CM | POA: Diagnosis not present

## 2024-06-10 DIAGNOSIS — I10 Essential (primary) hypertension: Secondary | ICD-10-CM | POA: Insufficient documentation

## 2024-06-10 DIAGNOSIS — R1310 Dysphagia, unspecified: Secondary | ICD-10-CM

## 2024-06-10 LAB — BASIC METABOLIC PANEL WITH GFR
Anion gap: 6 (ref 5–15)
BUN: 63 mg/dL — ABNORMAL HIGH (ref 8–23)
CO2: 29 mmol/L (ref 22–32)
Calcium: 8.1 mg/dL — ABNORMAL LOW (ref 8.9–10.3)
Chloride: 123 mmol/L — ABNORMAL HIGH (ref 98–111)
Creatinine, Ser: 1.35 mg/dL — ABNORMAL HIGH (ref 0.61–1.24)
GFR, Estimated: 58 mL/min — ABNORMAL LOW (ref >60)
Glucose, Bld: 209 mg/dL — ABNORMAL HIGH (ref 70–99)
Potassium: 5 mmol/L (ref 3.5–5.1)
Sodium: 158 mmol/L — ABNORMAL HIGH (ref 135–145)

## 2024-06-10 LAB — LACTIC ACID, PLASMA: Lactic Acid, Venous: 0.9 mmol/L (ref 0.5–1.9)

## 2024-06-10 LAB — GLUCOSE, CAPILLARY
Glucose-Capillary: 186 mg/dL — ABNORMAL HIGH (ref 70–99)
Glucose-Capillary: 202 mg/dL — ABNORMAL HIGH (ref 70–99)
Glucose-Capillary: 141 mg/dL — ABNORMAL HIGH (ref 70–99)
Glucose-Capillary: 156 mg/dL — ABNORMAL HIGH (ref 70–99)
Glucose-Capillary: 156 mg/dL — ABNORMAL HIGH (ref 70–99)
Glucose-Capillary: 152 mg/dL — ABNORMAL HIGH (ref 70–99)
Glucose-Capillary: 132 mg/dL — ABNORMAL HIGH (ref 70–99)

## 2024-06-10 LAB — BLOOD GAS, ARTERIAL
Acid-Base Excess: 6.6 mmol/L — ABNORMAL HIGH (ref 0.0–2.0)
Acid-Base Excess: 7.2 mmol/L — ABNORMAL HIGH (ref 0.0–2.0)
Bicarbonate: 33.3 mmol/L — ABNORMAL HIGH (ref 20.0–28.0)
Bicarbonate: 33.1 mmol/L — ABNORMAL HIGH (ref 20.0–28.0)
O2 Saturation: 99.2 %
O2 Saturation: 98.9 %
Patient temperature: 37.9
Patient temperature: 37.6
pCO2 arterial: 57 mmHg — ABNORMAL HIGH (ref 32–48)
pCO2 arterial: 52 mmHg — ABNORMAL HIGH (ref 32–48)
pH, Arterial: 7.38 (ref 7.35–7.45)
pH, Arterial: 7.41 (ref 7.35–7.45)
pO2, Arterial: 90 mmHg (ref 83–108)
pO2, Arterial: 105 mmHg (ref 83–108)

## 2024-06-10 LAB — AMMONIA: Ammonia: 36 umol/L — ABNORMAL HIGH (ref 9–35)

## 2024-06-10 LAB — PHOSPHORUS: Phosphorus: 4.6 mg/dL (ref 2.5–4.6)

## 2024-06-10 LAB — MAGNESIUM: Magnesium: 2.8 mg/dL — ABNORMAL HIGH (ref 1.7–2.4)

## 2024-06-10 MED ORDER — SODIUM CHLORIDE 0.9 % IV BOLUS
500.0000 mL | Freq: Once | INTRAVENOUS | Status: AC
  Administered 2024-06-10: 500 mL via INTRAVENOUS

## 2024-06-10 MED ORDER — ACETAZOLAMIDE SODIUM 500 MG IJ SOLR
500.0000 mg | Freq: Once | INTRAMUSCULAR | Status: AC
  Administered 2024-06-10: 500 mg via INTRAVENOUS
  Filled 2024-06-10: qty 500

## 2024-06-10 MED ORDER — SACUBITRIL-VALSARTAN 97-103 MG PO TABS: 1.0000 | ORAL_TABLET | Freq: Two times a day (BID) | ORAL | Status: DC

## 2024-06-10 MED ORDER — FREE WATER: 200.0000 mL | Status: DC

## 2024-06-10 MED ORDER — LEVETIRACETAM (KEPPRA) 500 MG/5 ML ADULT IV PUSH
1000.0000 mg | Freq: Two times a day (BID) | INTRAVENOUS | Status: DC
  Administered 2024-06-10 – 2024-06-11 (×2): 1000 mg via INTRAVENOUS
  Filled 2024-06-10 (×2): qty 10

## 2024-06-10 MED ORDER — FUROSEMIDE 10 MG/ML IJ SOLN: 20.0000 mg | Freq: Every day | INTRAMUSCULAR | Status: DC

## 2024-06-10 MED ORDER — CARVEDILOL 6.25 MG PO TABS: 6.2500 mg | ORAL_TABLET | Freq: Two times a day (BID) | ORAL | Status: DC

## 2024-06-10 MED ORDER — SODIUM CHLORIDE 0.9 % IV SOLN: 3.0000 g | Freq: Four times a day (QID) | INTRAVENOUS | Status: DC

## 2024-06-10 MED ORDER — PIPERACILLIN-TAZOBACTAM 3.375 G IVPB
3.3750 g | Freq: Four times a day (QID) | INTRAVENOUS | Status: DC
Start: 2024-06-10 — End: 2024-06-11
  Administered 2024-06-10 – 2024-06-11 (×5): 3.375 g via INTRAVENOUS
  Filled 2024-06-10 (×7): qty 50

## 2024-06-10 MED ORDER — LEVETIRACETAM 500 MG PO TABS
1000.0000 mg | ORAL_TABLET | Freq: Two times a day (BID) | ORAL | Status: DC
  Administered 2024-06-10: 1000 mg
  Filled 2024-06-10: qty 2

## 2024-06-10 MED ORDER — STERILE WATER FOR INJECTION IJ SOLN
INTRAMUSCULAR | Status: AC
  Administered 2024-06-10: 5 mL
  Filled 2024-06-10: qty 10

## 2024-06-10 NOTE — Progress Notes (Addendum)
 Per Dr. Claudene, no min goal for SBP.  Per MD, keep MAP > 65.

## 2024-06-10 NOTE — Significant Event (Signed)
 Rapid Response Event Note   Reason for Call :  Hypotension, decreased LOC.  Pt seen by RR RN on day shift for decreased LOC. At that time the follow workup was done:   ABG-7.35/57/90/33.3, recheck 7.41/52/105/33.1 CT head-with postop changes, decreased subdural fluid/air, mild improvedment in rightward midline shift. PCCM consult Bipap placed on pt(see above ABG with repeat after on bipap  RRT called d/t SBP-70s with continued decreased MS.  Initial Focused Assessment:  Pt lying in bed with eyes closed, in no visible distress. Pt will open his eyes to voice but will not speak, follow commands, or move extremities. Lungs with decreased breath sounds. Skin warm and dry.   HR-82, BP-78/55, RR-21, SpO2-94% on .40 bipap  Interventions:  CBG-156 LA PCCM consulted: Tx to ICU.  EEG Ammonia 500cc NS bolus-initiated Plan of Care:  Tx to 4N24. Event Summary:   MD Notified: Dr Kela notified by bedside RN and came to bedside. Dr. Claudene to bedside.  Call Time:2030 Arrival Time:2035 End Time:2140  Tish Graeme Piety, RN

## 2024-06-10 NOTE — Plan of Care (Signed)
   Problem: Education: Goal: Knowledge of disease or condition will improve Outcome: Progressing

## 2024-06-10 NOTE — Progress Notes (Signed)
   06/10/24 1033  Assess: MEWS Score  BP (!) 85/53  MAP (mmHg) (!) 64  ECG Heart Rate 86  Resp (!) 24  SpO2 94 %  O2 Device Bi-PAP  FiO2 (%) 40 %  Assess: MEWS Score  MEWS Temp 0  MEWS Systolic 1  MEWS Pulse 0  MEWS RR 1  MEWS LOC 0  MEWS Score 2  MEWS Score Color Yellow  Assess: if the MEWS score is Yellow or Red  Were vital signs accurate and taken at a resting state? Yes  Does the patient meet 2 or more of the SIRS criteria? No  MEWS guidelines implemented  Yes, yellow  Treat  MEWS Interventions Considered administering scheduled or prn medications/treatments as ordered  Take Vital Signs  Increase Vital Sign Frequency  Yellow: Q2hr x1, continue Q4hrs until patient remains green for 12hrs  Escalate  MEWS: Escalate Yellow: Discuss with charge nurse and consider notifying provider and/or RRT  Notify: Charge Nurse/RN  Name of Charge Nurse/RN Notified Diplomatic Services operational officer  Provider Notification  Provider Name/Title Dr Rojelio  Date Provider Notified 06/10/24  Time Provider Notified 1033  Method of Notification Face-to-face  Notification Reason Change in status  Provider response At bedside  Date of Provider Response 06/10/24  Time of Provider Response 1033  Notify: Rapid Response  Name of Rapid Response RN Notified Hannah RN  Date Rapid Response Notified 06/10/24  Time Rapid Response Notified 1033  Assess: SIRS CRITERIA  SIRS Temperature  0  SIRS Respirations  1  SIRS Pulse 0  SIRS WBC 0  SIRS Score Sum  1

## 2024-06-10 NOTE — Progress Notes (Signed)
  PROGRESS NOTE  Checked in on patient this afternoon. He is on BiPAP. His WOB is much improved and appears very comfortable. BP is on the lower side so will hold tonight's coreg  and entresto . He was started on zosyn and keppra also. Wife at bedside.    Delon Hoe, DO [facility] 06/10/2024, 2:30 PM  Available via Epic secure chat 7am-7pm After these hours, please refer to coverage provider listed on [email or web address]

## 2024-06-10 NOTE — Significant Event (Addendum)
 Rapid Response Event Note   Reason for Call :  Decreased LOC  Initial Focused Assessment:  Opens eyes briefly to voice, does not follow commands. No cough/gag. Skin hot to touch. Right lungs with crackles. Heart tones normal. Some edema present.   117/67 HR 94 RR 24 O2 91% Venti mask 8L 40%  Temp 99.7 rectal, given per tube tylenol  0615  Interventions/Plan of Care:  ABG 7.38/57/90 CT head stat CCM consult  Bipap started upon return from CT, repeat ABG pending post 1hr Bipap  Event Summary:  MD Notified: J. Rojelio ROSALEA GEANNIE Lanis MD, CANDIE Novas MD Call Time: [phone] Arrival Time: End Time:   Tonna Chiquita POUR, RN

## 2024-06-10 NOTE — Progress Notes (Signed)
   06/10/24 1020  BiPAP/CPAP/SIPAP  $ Non-Invasive Ventilator  Non-Invasive Vent Set Up;Non-Invasive Vent Initial  $ Face Mask Large  Yes  BiPAP/CPAP/SIPAP Pt Type Adult  BiPAP/CPAP/SIPAP SERVO  Mask Type Full face mask  Dentures removed? Not applicable  Mask Size Large  Set Rate 12 breaths/min  Respiratory Rate 24 breaths/min  IPAP 17 cmH20  EPAP 5 cmH2O  FiO2 (%) 40 %  Minute Ventilation 13.7  Leak 85  Peak Inspiratory Pressure (PIP) 14  Tidal Volume (Vt) 626  Patient Home Machine No  Safety Check Completed by RT for Home Unit Yes, no issues noted  Patient Home Mask No  Patient Home Tubing No  Auto Titrate No  Press High Alarm 25 cmH2O  Press Low Alarm 2 cmH2O  BiPAP/CPAP /SiPAP Vitals  Pulse Rate 88  Resp (!) 24  SpO2 95 %  MEWS Score/Color  MEWS Score 1  MEWS Score Color Green   Placed on BiPAP per MD order after MD reviewed ABG.  Settings noted above.

## 2024-06-10 NOTE — Progress Notes (Signed)
 After receiving report from day shift RN, PT bp was low 80's/50's with MAPs>65.  PT with eye opening to voice, not following commands, movement only in BUE with painful stimuli.  Repeat bp's showed downtrend.  Hospitalist notified and was enroute after examining pt and vitals provider contacted ICU team and RRT nurse.  After all examined PT deemed would be appropriate to transfer to ICU level of care.  Report called by this nurse as well as assisting with transport to 4n rm24.

## 2024-06-10 NOTE — Significant Event (Signed)
 Patient's nurse notified me that patient continued to remain hypotensive and lethargic.  On exam at bedside patient is minimally responsive on BiPAP.  I reviewed patient's home medications notes and labs.  Reviewed patient's ABG this morning.  Had a CT head at 10:00 AM today which showed postoperative changes with mild improvement in the midline shift.  Blood pressure is 78/55 systolic pulse 80/min temperature 98.4. HEENT anicteric no pallor. Chest bilateral air entry present. Heart S1-S2 heard. Abdomen soft nontender. Neuro lethargic minimally responsive.  Assessment and plan -    persistent hypotension with lethargy presently on BiPAP.  Will hold antihypertensives check CBG consulted critical care Dr. Claudene.  Discussed with patient's wife at the bedside.  Redia Cleaver MD

## 2024-06-10 NOTE — Progress Notes (Signed)
    Providing Compassionate, Quality Care - Together   NEUROSURGERY PROGRESS NOTE     S: No issues overnight. Pt w/ decreased mentation this AM, increased work w/ breathing.    O: EXAM:  BP 122/63 (BP Location: Right Arm)   Pulse 93   Temp 100.3 F (37.9 C) (Oral)   Resp (!) 22   Ht 5' 7 (1.702 m)   Wt (!) 140.6 kg   SpO2 95%   BMI 48.55 kg/m     Drowsy, will open eyes to voice.  Pupils equal   ASSESSMENT:  68 y.o. s/p left frontal crani for resection of met, evac of hematoma     PLAN: -Given neuro change, will order f/u CTH -Continue add'l workup per primary attending.  -Call w/ questions/concerns.   Camie Pickle, [facility]

## 2024-06-10 NOTE — Progress Notes (Signed)
 RT NOTE:  Pt transported on BIPAP to 4N24 without event. Report given to [facility], RRT.

## 2024-06-10 NOTE — Progress Notes (Signed)
 NAMEDorsie Rasmussen, MRN:  [id number], DOB:  1956-08-14, LOS: 8 ADMISSION DATE:  06/02/2024, CONSULTATION DATE:  9/27 REFERRING MD:  Michaela, neuro CHIEF COMPLAINT: ICH   History of Present Illness:  68 year old male with past medical history of pulmonary hypertension, hyperlipidemia, OSA on CPAP at night, HFrEF, COPD, melanoma currently on nivolumab  presented to the ED this evening with right-sided weakness and aphasia. LKW around 9 AM.  Wife in the emergency department reported that around noon he seemed tired and not speaking much.  Also noted to be a little confused.  On arrival to ED, noted to have right-sided weakness, aphasia and code stroke was activated.  CT head with left frontal lobe 4 x 6 x 4 cm hemorrhage with concern for underlying mass, vasogenic edema and 9 mm midline shift.  CT angio with no LVO.  Labs with hemoglobin 12.3, sodium 140, creatinine 1.26, ethanol negative, glucose normal.  He was evaluated by neurology who admitted him to ICU.  CCM with cross coverage consult.  Pertinent  Medical History  pulmonary hypertension, hyperlipidemia, OSA on CPAP at night, HFrEF, COPD, melanoma currently on nivolumab   Past Medical History:  Diagnosis Date   2019 novel coronavirus disease (COVID-19) 07/2019   COPD (chronic obstructive pulmonary disease) (HCC)    HFrEF (heart failure with reduced ejection fraction) (HCC)    Melanoma (HCC)    Nonischemic cardiomyopathy (HCC)    OSA (obstructive sleep apnea)    Pulmonary hypertension (HCC)    WHO group II    Significant Hospital Events: Including procedures, antibiotic start and stop dates in addition to other pertinent events   9/27: Admit to ICU s/p code stroke with left frontal lobe ICH and concern for mass 9/30: Craniotomy with tumor resection and hematoma evacuation 10/1: Extubated 10/2: Unable to swallow plans for Cortrak tomorrow 10/3: Remain on 5 L nasal cannula oxygen, generalized weak, afebrile  Interim History /  Subjective:  PCCM was reconsulted because patient was found lethargic with increased work of breathing Temperature went up to 100.3 Patient did not wear CPAP at night for unclear reason  Objective   Blood pressure (!) 90/56, pulse 88, temperature 99.7 F (37.6 C), temperature source Rectal, resp. rate (!) 24, height 5' 7 (1.702 m), weight (!) 140.6 kg, SpO2 94%.    FiO2 (%):  [40 %] 40 %   Intake/Output Summary (Last 24 hours) at 06/10/2024 1129 Last data filed at 06/10/2024 9361 Gross per 24 hour  Intake 1640.74 ml  Output 1800 ml  Net -159.26 ml   Filed Weights   06/07/24 0500 06/08/24 0500 06/10/24 0500  Weight: (!) 140.6 kg (!) 140 kg (!) 140.6 kg    Examination: General: Acute on chronically ill-appearing morbidly obese male, lying on the bed HEENT: [place]/AT, eyes anicteric.  moist mucus membranes. cortrak in place Neuro: Lethargic, opens eyes with vocal stimuli, following simple commands, generalized weak Chest: Bilateral basal crackles right more than left, no wheezes or rhonchi Heart: Regular rate and rhythm, no murmurs or gallops Abdomen: Soft, nontender, nondistended, bowel sounds present  ABG reviewed  Patient Lines/Drains/Airways Status     Active Line/Drains/Airways     Name Placement date Placement time Site Days   Peripheral IV 06/05/24 18 G 1.75 Left Antecubital 06/05/24  1125  Antecubital  4   Peripheral IV 06/06/24 22 G 1.75 Anterior;Right Forearm 06/06/24  2257  Forearm  3   External Urinary Catheter 06/06/24  1815  --  3   Small Bore Feeding  Tube 10 Fr. Left nare Marking at nare/corner of mouth 66 cm 06/08/24  0947  Left nare  1   Wound 06/05/24 1331 Surgical Closed Surgical Incision Head 06/05/24  1331  Head  4         Resolved Hospital Problem list    Assessment & Plan:  Acute left frontal intraparenchymal hemorrhage due to hemorrhagic tumor Cerebral (Vasogenic) edema with brain compression Single 2.5 cm enhancing tumor (melanoma) with  surrounding hemorrhage.  No evidence of other metastasis S/p craniotomy with hematoma evacuation on 06/05/2024 Induced hypernatremia Melanoma on immunotherapy Dysphagia Hypertension, controlled Acute kidney injury, likely due to dehydration COPD, not in exacerbation OSA on CPAP at night  Morbid obesity Pulmonary hypertension, previously on home O2, now prn  Acute hypoxic/hypercapnic respiratory failure due to bilateral lower lobe atelectasis Chronic right hemidiaphragm elevation Chronic right ventricular HFrEF in the setting of pulmonary hypertension Hyperlipidemia Prediabetes  Repeat CT head showed no acute changes Patient did not wear CPAP overnight Will place him on BiPAP He has very weak cough and gag, probably aspirating ABG looks reassuring Try to obtain a sputum culture Started on IV Zosyn Continue Lasix   Rest of the management per primary team   Labs   CBC: Recent Labs  Lab 06/04/24 0606 06/05/24 0514 06/05/24 1154 06/05/24 1427 06/05/24 1511 06/06/24 0415 06/07/24 0514 06/09/24 0803  WBC 13.9* 13.8*  --   --   --  10.3 10.6* 11.7*  NEUTROABS  --   --   --   --   --   --   --  9.9*  HGB 11.8* 11.8*   < > 11.9* 10.5* 10.9* 12.3* 13.9  HCT 37.6* 38.2*   < > 35.0* 31.0* 36.0* 41.5 45.7  MCV 84.7 85.8  --   --   --  87.4 87.7 86.4  PLT 289 326  --   --   --  269 267 262   < > = values in this interval not displayed.    Basic Metabolic Panel: Recent Labs  Lab 06/05/24 0514 06/05/24 1154 06/05/24 1511 06/05/24 1536 06/06/24 0415 06/06/24 2308 06/07/24 0514 06/07/24 1221 06/07/24 2323 06/09/24 0803 06/10/24 0420  NA 155*   < > 157*   < > 155*   < > 157* 157* 160* 159* 158*  K 4.2   < > 4.1  --  3.9  --  3.9  --   --  4.6 5.0  CL 120*  --   --   --  121*  --  123*  --   --  124* 123*  CO2 26  --   --   --  23  --  24  --   --  26 29  GLUCOSE 146*  --   --   --  144*  --  150*  --   --  170* 209*  BUN 34*  --   --   --  37*  --  35*  --   --  53* 63*   CREATININE 1.35*  --   --   --  1.31*  --  1.14  --   --  1.20 1.35*  CALCIUM  8.4*  --   --   --  8.2*  --  8.4*  --   --  8.4* 8.1*  MG  --   --   --   --   --   --   --  2.9*  --  3.2* 2.8*  PHOS  --   --   --   --   --   --   --  3.0  --  4.3 4.6   < > = values in this interval not displayed.   GFR: Estimated Creatinine Clearance: 73 mL/min (A) (by C-G formula based on SCr of 1.35 mg/dL (H)). Recent Labs  Lab 06/05/24 0514 06/06/24 0415 06/07/24 0514 06/09/24 0803  WBC 13.8* 10.3 10.6* 11.7*    Liver Function Tests: No results for input(s): AST, ALT, ALKPHOS, BILITOT, PROT, ALBUMIN in the last 168 hours.  No results for input(s): LIPASE, AMYLASE in the last 168 hours. No results for input(s): AMMONIA in the last 168 hours.  ABG    Component Value Date/Time   PHART 7.38 06/10/2024 0908   PCO2ART 57 (H) 06/10/2024 0908   PO2ART 90 06/10/2024 0908   HCO3 33.3 (H) 06/10/2024 0908   TCO2 30 06/05/2024 1511   O2SAT 99.2 06/10/2024 0908     Coagulation Profile: No results for input(s): INR, PROTIME in the last 168 hours.   Cardiac Enzymes: No results for input(s): CKTOTAL, CKMB, CKMBINDEX, TROPONINI in the last 168 hours.  HbA1C: Hgb A1c MFr Bld  Date/Time Value Ref Range Status  06/02/2024 10:35 PM 5.5 4.8 - 5.6 % Final    Comment:    (NOTE) Diagnosis of Diabetes The following HbA1c ranges recommended by the American Diabetes Association (ADA) may be used as an aid in the diagnosis of diabetes mellitus.  Hemoglobin             Suggested A1C NGSP%              Diagnosis  <5.7                   Non Diabetic  5.7-6.4                Pre-Diabetic  >6.4                   Diabetic  <7.0                   Glycemic control for                       adults with diabetes.    11/10/2021 10:22 AM 5.9 (H) 4.8 - 5.6 % Final    Comment:             Prediabetes: 5.7 - 6.4          Diabetes: >6.4          Glycemic control for adults with  diabetes: <7.0     CBG: Recent Labs  Lab 06/09/24 1541 06/09/24 2023 06/09/24 2340 06/10/24 0416 06/10/24 0741  GLUCAP 185* 184* 182* 186* 202*       Valinda Novas, MD [facility] See Amion for pager If no response to pager, please call [phone] until 7pm After 7pm, Please call E-link [phone]

## 2024-06-10 NOTE — Progress Notes (Signed)
  NEUROSURGERY PROGRESS NOTE   Pt was found to be somnolent and much less interactive this am, with significantly labored breathing and ABG showing hypercarbia. I have reviewed his CTH this am which is essentially unchanged from prior, with significantly improved MLS compared to preop, stable left hemispheric edema, and stable resection cavity without new hemorrhage or hydrocephalus. I suspect his neurologic condition is respiratory driven and would recommend evaluation by PCCM for further mgmt.   Gerldine Maizes, MD [facility]

## 2024-06-10 NOTE — Progress Notes (Signed)
 eLink Physician-Brief Progress Note Patient Name: Justin Rasmussen DOB: July 31, 1956 MRN: [id number]   Date of Service  06/10/2024  HPI/Events of Note  Transferred back to the ICU for hypotension and encephalopathy.  Received 500 cc LR bolus with improvement in MAP.  Goal MAP greater than 65 per ground team  eICU Interventions  Currently no intervention indicated, maintain BiPAP, may need vasopressors   0113 -pressures slightly low again, initiate norepinephrine peripherally  0221 -urinary retention protocol  0434 - Escalating NE requirements, attempt repeat fluid bolus  0530 -reduced responsiveness and responsiveness despite BiPAP therapy.  ABG pending.  Request ground team evaluation for potential intubation  Intervention Category Intermediate Interventions: Hypotension - evaluation and management  Justin Rasmussen 06/10/2024, 10:44 PM

## 2024-06-10 NOTE — Progress Notes (Signed)
 Called back. Still pretty out of it. Wob okay. Good mechanics on bipap Bp softer after acetazolamide  Opens eyes briefly then falls asleep Will get another eeg. Check ammonia 500 cc ns bolus No immediate need for reintubation but warrants closer airway watch in icu with this degree of persistent encephalopathy on bipap  Fine to remain on trh service if he bounces out of this.  Wife updated at bedside  Rolan Sharps md PCCM

## 2024-06-10 NOTE — Progress Notes (Signed)
 PROGRESS NOTE    Justin Rasmussen  FMW:[id number] DOB: Dec 29, 1955 DOA: 06/02/2024 PCP: Colette Torrence GRADE, MD     Brief Narrative:  Justin Rasmussen is a 68 year old male with past medical history of pulmonary hypertension, hyperlipidemia, OSA on CPAP at night, HFrEF, COPD, melanoma currently on nivolumab  presented to the ED with right-sided weakness and aphasia. LKW around 9 AM.  Wife in the emergency department reported that around noon he seemed tired and not speaking much.  Also noted to be a little confused.  On arrival to ED, noted to have right-sided weakness, aphasia and code stroke was activated.  CT head with left frontal lobe 4 x 6 x 4 cm hemorrhage with concern for underlying mass, vasogenic edema and 9 mm midline shift.  CT angio with no LVO.   9/27: Admit to ICU s/p code stroke with left frontal lobe ICH and concern for mass 9/30: Craniotomy with tumor resection and hematoma evacuation 10/1: Extubated 10/5: TRH service assumed care  New events last 24 hours / Subjective: This morning, patient noted to be much more lethargic and not following commands as before. He did move his LUE to command after some time. Wife at bedside states this is a great decline from progress she saw on Friday with PT. He has not worn CPAP since prior to craniotomy. Work of breathing also increased this morning. Cortrak in place. Blood sugar 202 this morning.   Assessment & Plan:  Principal Problem:   Intracranial hemorrhage (HCC) Active Problems:   Morbid obesity (HCC)   Elevated hemidiaphragm-Rt   OSA (obstructive sleep apnea)   Melanoma metastatic to brain (HCC)   Dysphagia   Hypernatremia   HTN (hypertension)   Chronic diastolic CHF (congestive heart failure) (HCC)   AKI (acute kidney injury)   COPD (chronic obstructive pulmonary disease) (HCC)   Prediabetes  Acute left frontal intraparenchymal hemorrhage due to hemorrhagic tumor Cerebral (Vasogenic) edema with brain compression Metastatic  melanoma  S/p craniotomy with hematoma evacuation on 06/05/2024 Acute metabolic encephalopathy - Much more lethargic today and not following commands as previously, increased work of breathing today. Stat ABG done. Called PCCM to re-evaluate today. BiPAP ordered - Stat CT head per Neurosurgery  - Continue dexamethasone  per Neurosurgery   Dysphagia - Cortrak for TF. Currently on hold while on BiPAP    Hypernatremia - Increase free water flushes today   HTN - Goal SBP 130-150 - Coreg , Entresto   AKI - Baseline Cr 0.9 - Hold lasix    OSA - CPAP PTA at bedtime   COPD - Continue [place] O2 with goal SpO2 88-92%   Chronic diastolic CHF - Echo 06/04/24 showed EF 60-65% - Lasix  (on torsemide  PTA) hold today - Entresto , coreg    Prediabetes - SSI     DVT prophylaxis: Lovenox   SCDs Start: 06/02/24 2111  Code Status: Full Family Communication: Wife at bedside Disposition Plan: ?CIR  Status is: Inpatient Remains inpatient appropriate because: encephalopathy     Antimicrobials:  Anti-infectives (From admission, onward)    None        Objective: Vitals:   06/10/24 0414 06/10/24 0500 06/10/24 0739 06/10/24 0928  BP: 113/63  122/63 117/67  Pulse: 94  93   Resp: 18  (!) 22 (!) 24  Temp: 99.2 F (37.3 C)  100.3 F (37.9 C) 99.7 F (37.6 C)  TempSrc: Oral  Oral Rectal  SpO2: 96%  95% 91%  Weight:  (!) 140.6 kg    Height:  Intake/Output Summary (Last 24 hours) at 06/10/2024 0952 Last data filed at 06/10/2024 9361 Gross per 24 hour  Intake 1640.74 ml  Output 3000 ml  Net -1359.26 ml   Filed Weights   06/07/24 0500 06/08/24 0500 06/10/24 0500  Weight: (!) 140.6 kg (!) 140 kg (!) 140.6 kg    Examination:  General exam: Appears ill-appearing, flutters eyes with verbal stimuli but does not stay awake  Respiratory system: Clear to auscultation anteriorly, increased WOB  Cardiovascular system: S1 & S2 heard, RRR.  Gastrointestinal system: Abdomen is  nondistended, soft, cortrak in place  Central nervous system: Moves LUE with verbal stimuli  Extremities: Symmetric in appearance   Data Reviewed: I have personally reviewed following labs and imaging studies  CBC: Recent Labs  Lab 06/04/24 0606 06/05/24 0514 06/05/24 1154 06/05/24 1427 06/05/24 1511 06/06/24 0415 06/07/24 0514 06/09/24 0803  WBC 13.9* 13.8*  --   --   --  10.3 10.6* 11.7*  NEUTROABS  --   --   --   --   --   --   --  9.9*  HGB 11.8* 11.8*   < > 11.9* 10.5* 10.9* 12.3* 13.9  HCT 37.6* 38.2*   < > 35.0* 31.0* 36.0* 41.5 45.7  MCV 84.7 85.8  --   --   --  87.4 87.7 86.4  PLT 289 326  --   --   --  269 267 262   < > = values in this interval not displayed.   Basic Metabolic Panel: Recent Labs  Lab 06/05/24 0514 06/05/24 1154 06/05/24 1511 06/05/24 1536 06/06/24 0415 06/06/24 2308 06/07/24 0514 06/07/24 1221 06/07/24 2323 06/09/24 0803 06/10/24 0420  NA 155*   < > 157*   < > 155*   < > 157* 157* 160* 159* 158*  K 4.2   < > 4.1  --  3.9  --  3.9  --   --  4.6 5.0  CL 120*  --   --   --  121*  --  123*  --   --  124* 123*  CO2 26  --   --   --  23  --  24  --   --  26 29  GLUCOSE 146*  --   --   --  144*  --  150*  --   --  170* 209*  BUN 34*  --   --   --  37*  --  35*  --   --  53* 63*  CREATININE 1.35*  --   --   --  1.31*  --  1.14  --   --  1.20 1.35*  CALCIUM  8.4*  --   --   --  8.2*  --  8.4*  --   --  8.4* 8.1*  MG  --   --   --   --   --   --   --  2.9*  --  3.2* 2.8*  PHOS  --   --   --   --   --   --   --  3.0  --  4.3 4.6   < > = values in this interval not displayed.   GFR: Estimated Creatinine Clearance: 73 mL/min (A) (by C-G formula based on SCr of 1.35 mg/dL (H)). Liver Function Tests: No results for input(s): AST, ALT, ALKPHOS, BILITOT, PROT, ALBUMIN in the last 168 hours. No results for input(s): LIPASE, AMYLASE in the last  168 hours. No results for input(s): AMMONIA in the last 168 hours. Coagulation Profile: No  results for input(s): INR, PROTIME in the last 168 hours. Cardiac Enzymes: No results for input(s): CKTOTAL, CKMB, CKMBINDEX, TROPONINI in the last 168 hours. BNP (last 3 results) No results for input(s): PROBNP in the last 8760 hours. HbA1C: No results for input(s): HGBA1C in the last 72 hours. CBG: Recent Labs  Lab 06/09/24 1541 06/09/24 2023 06/09/24 2340 06/10/24 0416 06/10/24 0741  GLUCAP 185* 184* 182* 186* 202*   Lipid Profile: No results for input(s): CHOL, HDL, LDLCALC, TRIG, CHOLHDL, LDLDIRECT in the last 72 hours. Thyroid  Function Tests: No results for input(s): TSH, T4TOTAL, FREET4, T3FREE, THYROIDAB in the last 72 hours. Anemia Panel: No results for input(s): VITAMINB12, FOLATE, FERRITIN, TIBC, IRON, RETICCTPCT in the last 72 hours. Sepsis Labs: No results for input(s): PROCALCITON, LATICACIDVEN in the last 168 hours.  Recent Results (from the past 240 hours)  MRSA Next Gen by PCR, Nasal     Status: Abnormal   Collection Time: 06/02/24  8:49 PM   Specimen: Nasal Mucosa; Nasal Swab  Result Value Ref Range Status   MRSA by PCR Next Gen DETECTED (A) NOT DETECTED Final    Comment: RESULT CALLED TO, READ BACK BY AND VERIFIED WITH: TIRRELL RN 06/02/2024 @ 2325 BY AB (NOTE) The GeneXpert MRSA Assay (FDA approved for NASAL specimens only), is one component of a comprehensive MRSA colonization surveillance program. It is not intended to diagnose MRSA infection nor to guide or monitor treatment for MRSA infections. Test performance is not FDA approved in patients less than 11 years old. Performed at [facility], [address], [place], [place] [address]       Radiology Studies: DG CHEST PORT 1 VIEW Result Date: 06/09/2024 CLINICAL DATA:  [id number] Acute and chronic respiratory failure (acute-on-chronic) (HCC) [id number] EXAM: PORTABLE CHEST 1 VIEW COMPARISON:  Chest x-ray 06/05/2024 FINDINGS: Patient is rotated.  Enteric tube tip coursing below the diaphragm with tip overlying the right quadrant likely in the region of the gastric antrum. Interval removal of an endotracheal tube. The heart and mediastinal contours are within normal limits. Elevated right hemidiaphragm. No focal consolidation. No pulmonary edema. No pleural effusion. No pneumothorax. No acute osseous abnormality. IMPRESSION: 1. No active disease. 2. Foley in appropriate position Electronically Signed   By: Morgane  Naveau M.D.   On: 06/09/2024 10:50      Scheduled Meds:  acetaZOLAMIDE   500 mg Intravenous Once   carvedilol   6.25 mg Per Tube BID WC   Chlorhexidine Gluconate Cloth  6 each Topical Daily   dexamethasone  (DECADRON ) injection  4 mg Intravenous Q6H   enoxaparin  (LOVENOX ) injection  70 mg Subcutaneous Q24H   feeding supplement (PROSource TF20)  60 mL Per Tube BID   free water  200 mL Per Tube Q4H   insulin aspart  0-20 Units Subcutaneous Q4H   levETIRAcetam  1,000 mg Per Tube BID   multivitamin with minerals  1 tablet Per Tube Daily   mupirocin ointment   Nasal BID   mouth rinse  15 mL Mouth Rinse QID   pantoprazole (PROTONIX) IV  40 mg Intravenous QHS   sacubitril -valsartan   1 tablet Per Tube BID   senna-docusate  1 tablet Per Tube BID   thiamine  100 mg Per Tube Daily   Continuous Infusions:  feeding supplement (OSMOLITE 1.5 CAL) 55 mL/hr at 06/10/24 0345     LOS: 8 days   Time spent: 45  minutes   Delon Hoe, DO [facility] 06/10/2024, 9:52 AM   Available via Epic secure chat 7am-7pm After these hours, please refer to coverage provider listed on [email or web address]

## 2024-06-11 ENCOUNTER — Inpatient Hospital Stay (HOSPITAL_COMMUNITY)

## 2024-06-11 ENCOUNTER — Inpatient Hospital Stay: Attending: Neurosurgery

## 2024-06-11 DIAGNOSIS — R4182 Altered mental status, unspecified: Secondary | ICD-10-CM

## 2024-06-11 DIAGNOSIS — E870 Hyperosmolality and hypernatremia: Secondary | ICD-10-CM | POA: Diagnosis not present

## 2024-06-11 DIAGNOSIS — I6200 Nontraumatic subdural hemorrhage, unspecified: Secondary | ICD-10-CM | POA: Diagnosis not present

## 2024-06-11 DIAGNOSIS — I629 Nontraumatic intracranial hemorrhage, unspecified: Secondary | ICD-10-CM | POA: Diagnosis not present

## 2024-06-11 DIAGNOSIS — R569 Unspecified convulsions: Secondary | ICD-10-CM | POA: Diagnosis not present

## 2024-06-11 DIAGNOSIS — N179 Acute kidney failure, unspecified: Secondary | ICD-10-CM | POA: Diagnosis not present

## 2024-06-11 DIAGNOSIS — C7931 Secondary malignant neoplasm of brain: Secondary | ICD-10-CM | POA: Diagnosis not present

## 2024-06-11 DIAGNOSIS — I609 Nontraumatic subarachnoid hemorrhage, unspecified: Secondary | ICD-10-CM | POA: Diagnosis not present

## 2024-06-11 DIAGNOSIS — G9389 Other specified disorders of brain: Secondary | ICD-10-CM | POA: Diagnosis not present

## 2024-06-11 LAB — BASIC METABOLIC PANEL WITH GFR
Anion gap: 10 (ref 5–15)
Anion gap: 10 (ref 5–15)
Anion gap: 8 (ref 5–15)
BUN: 94 mg/dL — ABNORMAL HIGH (ref 8–23)
BUN: 94 mg/dL — ABNORMAL HIGH (ref 8–23)
BUN: 87 mg/dL — ABNORMAL HIGH (ref 8–23)
CO2: 24 mmol/L (ref 22–32)
CO2: 25 mmol/L (ref 22–32)
CO2: 28 mmol/L (ref 22–32)
Calcium: 8.1 mg/dL — ABNORMAL LOW (ref 8.9–10.3)
Calcium: 8.1 mg/dL — ABNORMAL LOW (ref 8.9–10.3)
Calcium: 8.5 mg/dL — ABNORMAL LOW (ref 8.9–10.3)
Chloride: 124 mmol/L — ABNORMAL HIGH (ref 98–111)
Chloride: 124 mmol/L — ABNORMAL HIGH (ref 98–111)
Chloride: 122 mmol/L — ABNORMAL HIGH (ref 98–111)
Creatinine, Ser: 2.28 mg/dL — ABNORMAL HIGH (ref 0.61–1.24)
Creatinine, Ser: 2.37 mg/dL — ABNORMAL HIGH (ref 0.61–1.24)
Creatinine, Ser: 1.9 mg/dL — ABNORMAL HIGH (ref 0.61–1.24)
GFR, Estimated: 29 mL/min — ABNORMAL LOW (ref >60)
GFR, Estimated: 31 mL/min — ABNORMAL LOW (ref >60)
GFR, Estimated: 38 mL/min — ABNORMAL LOW (ref >60)
Glucose, Bld: 148 mg/dL — ABNORMAL HIGH (ref 70–99)
Glucose, Bld: 151 mg/dL — ABNORMAL HIGH (ref 70–99)
Glucose, Bld: 170 mg/dL — ABNORMAL HIGH (ref 70–99)
Potassium: 4.9 mmol/L (ref 3.5–5.1)
Potassium: 4.9 mmol/L (ref 3.5–5.1)
Potassium: 4.9 mmol/L (ref 3.5–5.1)
Sodium: 158 mmol/L — ABNORMAL HIGH (ref 135–145)
Sodium: 159 mmol/L — ABNORMAL HIGH (ref 135–145)
Sodium: 158 mmol/L — ABNORMAL HIGH (ref 135–145)

## 2024-06-11 LAB — CBC WITH DIFFERENTIAL/PLATELET
Abs Immature Granulocytes: 0.15 K/uL — ABNORMAL HIGH (ref 0.00–0.07)
Basophils Absolute: 0 K/uL (ref 0.0–0.1)
Basophils Relative: 0 %
Eosinophils Absolute: 0 K/uL (ref 0.0–0.5)
Eosinophils Relative: 0 %
HCT: 48.8 % (ref 39.0–52.0)
Hemoglobin: 14.3 g/dL (ref 13.0–17.0)
Immature Granulocytes: 1 %
Lymphocytes Relative: 6 %
Lymphs Abs: 1.2 K/uL (ref 0.7–4.0)
MCH: 26 pg (ref 26.0–34.0)
MCHC: 29.3 g/dL — ABNORMAL LOW (ref 30.0–36.0)
MCV: 88.9 fL (ref 80.0–100.0)
Monocytes Absolute: 1.4 K/uL — ABNORMAL HIGH (ref 0.1–1.0)
Monocytes Relative: 7 %
Neutro Abs: 17.6 K/uL — ABNORMAL HIGH (ref 1.7–7.7)
Neutrophils Relative %: 86 %
Platelets: 243 K/uL (ref 150–400)
RBC: 5.49 MIL/uL (ref 4.22–5.81)
RDW: 16.4 % — ABNORMAL HIGH (ref 11.5–15.5)
WBC: 20.3 K/uL — ABNORMAL HIGH (ref 4.0–10.5)
nRBC: 0 % (ref 0.0–0.2)

## 2024-06-11 LAB — URINALYSIS, W/ REFLEX TO CULTURE (INFECTION SUSPECTED)
Bacteria, UA: NONE SEEN
Bilirubin Urine: NEGATIVE
Glucose, UA: NEGATIVE mg/dL
Ketones, ur: NEGATIVE mg/dL
Leukocytes,Ua: NEGATIVE
Nitrite: NEGATIVE
Protein, ur: NEGATIVE mg/dL
Specific Gravity, Urine: 1.021 (ref 1.005–1.030)
pH: 7 (ref 5.0–8.0)

## 2024-06-11 LAB — POCT I-STAT 7, (LYTES, BLD GAS, ICA,H+H)
Acid-Base Excess: 2 mmol/L (ref 0.0–2.0)
Bicarbonate: 29.2 mmol/L — ABNORMAL HIGH (ref 20.0–28.0)
Calcium, Ion: 1.18 mmol/L (ref 1.15–1.40)
HCT: 39 % (ref 39.0–52.0)
Hemoglobin: 13.3 g/dL (ref 13.0–17.0)
O2 Saturation: 98 %
Patient temperature: 97.8
Potassium: 4.8 mmol/L (ref 3.5–5.1)
Sodium: 164 mmol/L (ref 135–145)
TCO2: 31 mmol/L (ref 22–32)
pCO2 arterial: 52.2 mmHg — ABNORMAL HIGH (ref 32–48)
pH, Arterial: 7.354 (ref 7.35–7.45)
pO2, Arterial: 106 mmHg (ref 83–108)

## 2024-06-11 LAB — PROCALCITONIN: Procalcitonin: <0.1 ng/mL

## 2024-06-11 LAB — PHOSPHORUS: Phosphorus: 5.1 mg/dL — ABNORMAL HIGH (ref 2.5–4.6)

## 2024-06-11 LAB — GLUCOSE, CAPILLARY
Glucose-Capillary: 143 mg/dL — ABNORMAL HIGH (ref 70–99)
Glucose-Capillary: 166 mg/dL — ABNORMAL HIGH (ref 70–99)
Glucose-Capillary: 164 mg/dL — ABNORMAL HIGH (ref 70–99)
Glucose-Capillary: 135 mg/dL — ABNORMAL HIGH (ref 70–99)
Glucose-Capillary: 155 mg/dL — ABNORMAL HIGH (ref 70–99)
Glucose-Capillary: 154 mg/dL — ABNORMAL HIGH (ref 70–99)

## 2024-06-11 LAB — TSH: TSH: 1.018 u[IU]/mL (ref 0.350–4.500)

## 2024-06-11 LAB — MAGNESIUM: Magnesium: 3.2 mg/dL — ABNORMAL HIGH (ref 1.7–2.4)

## 2024-06-11 MED ORDER — LACTATED RINGERS IV BOLUS
500.0000 mL | Freq: Once | INTRAVENOUS | Status: AC
  Administered 2024-06-11: 500 mL via INTRAVENOUS

## 2024-06-11 MED ORDER — MIDODRINE HCL 5 MG PO TABS
10.0000 mg | ORAL_TABLET | Freq: Three times a day (TID) | ORAL | Status: DC
  Administered 2024-06-11: 10 mg via ORAL
  Filled 2024-06-11: qty 2

## 2024-06-11 MED ORDER — FREE WATER
300.0000 mL | Status: DC
  Administered 2024-06-11 – 2024-06-16 (×31): 300 mL

## 2024-06-11 MED ORDER — LEVETIRACETAM (KEPPRA) 500 MG/5 ML ADULT IV PUSH
500.0000 mg | Freq: Two times a day (BID) | INTRAVENOUS | Status: DC
  Administered 2024-06-11 – 2024-06-21 (×20): 500 mg via INTRAVENOUS
  Filled 2024-06-11 (×20): qty 5

## 2024-06-11 MED ORDER — SODIUM CHLORIDE 0.9 % IV SOLN: 250.0000 mL | INTRAVENOUS | Status: AC

## 2024-06-11 MED ORDER — MIDODRINE HCL 5 MG PO TABS
10.0000 mg | ORAL_TABLET | Freq: Three times a day (TID) | ORAL | Status: DC
  Administered 2024-06-11 – 2024-06-13 (×5): 10 mg via ORAL
  Filled 2024-06-11 (×5): qty 2

## 2024-06-11 MED ORDER — METOCLOPRAMIDE HCL 5 MG/ML IJ SOLN
10.0000 mg | Freq: Two times a day (BID) | INTRAMUSCULAR | Status: AC
  Administered 2024-06-11 (×2): 10 mg via INTRAVENOUS
  Filled 2024-06-11 (×2): qty 2

## 2024-06-11 MED ORDER — PIPERACILLIN-TAZOBACTAM 3.375 G IVPB
3.3750 g | Freq: Three times a day (TID) | INTRAVENOUS | Status: AC
  Administered 2024-06-11 – 2024-06-17 (×17): 3.375 g via INTRAVENOUS
  Filled 2024-06-11 (×18): qty 50

## 2024-06-11 MED ORDER — NOREPINEPHRINE 4 MG/250ML-% IV SOLN
0.0000 ug/min | INTRAVENOUS | Status: DC
  Administered 2024-06-11: 2 ug/min via INTRAVENOUS
  Filled 2024-06-11: qty 250

## 2024-06-11 NOTE — Progress Notes (Signed)
 Initial Nutrition Assessment  DOCUMENTATION CODES:   Morbid obesity  INTERVENTION:   Resume tube feeding via Cortrak tube: Osmolite 1.5 at 25 ml/h and increase by 10 ml every 8 hours to goal rate of 55 ml/hr (1320 ml per day)  Prosource TF20 60 ml BID  Provides 2140 kcal, 122 gm protein, 1003 ml free water daily   300 ml free water every 4 hours - adjust per provider  Total free water: 2803 ml   Continue 100 mg thiamine daily x 7 days  MVI with minerals daily   Monitor magnesium and phosphorus daily x 4 occurrences, MD to replete as needed, as pt is at risk for refeeding syndrome given pt with inadequate PO during 5 day admission.   NUTRITION DIAGNOSIS:   Inadequate oral intake related to inability to eat as evidenced by NPO status. - ongoing   GOAL:   Patient will meet greater than or equal to 90% of their needs - not met   MONITOR:   TF tolerance, Labs  REASON FOR ASSESSMENT:   Consult Enteral/tube feeding initiation and management  ASSESSMENT:   Pt with PMH of pulmonary HTN, HLD, OSA on CPAP at night, HF, COPD, and melanoma on immunotherapy (nivolumab ) admitted with R-sided weakness and aphasia, found to have L frontal ICH from hemorrhagic tumor s/p craniotomy.    Rapid response called for hypotension/lethargy yesterday, patient placed on BIPAP. TF and prosource on hold due to continuous BIPAP, feeds wereinfusing at goal rate 55 ml/hr prior to being held. Pt back from CT, RN and RT in the room to attempt weaning off BIPAP. Sodium 158, current FWF ordered for 300 ml q 4 hrs and 3% saline stopped.   9/27 - admit with L frontal ICH 9/28 - per MRI pt with hemorrhagic tumor 9/30 - s/p craniotomy for tumor resection 10/1 - extubated 10/3 - s/p cortrak placement; tip gastric 10/5 transfer out of ICU but later that night had hypotension and AMS prompting transfer back to ICU   Medications reviewed and include: decadron , prosource, free water, keppra, protonix,   reglan, thiamine 100 mg Infusions: Levo @ 2 mcg/min  Cleviprex @ 4 ml/hr provides: 192 kcal   Labs reviewed:  Na 158 Cl 124  Glucose 148 BUN 94 Cr 2.28  Calcium   8.1  Phos 5.1 Mag 3.2 A1C 5.5 CBG's: 141-166  UOP 1150 ml  NUTRITION - FOCUSED PHYSICAL EXAM:  Flowsheet Row Most Recent Value  Orbital Region No depletion  Upper Arm Region No depletion  Thoracic and Lumbar Region No depletion  Buccal Region Mild depletion  Temple Region No depletion  Clavicle Bone Region Moderate depletion  Clavicle and Acromion Bone Region Moderate depletion  Scapular Bone Region Unable to assess  Dorsal Hand Unable to assess  Patellar Region No depletion  Anterior Thigh Region No depletion  Posterior Calf Region No depletion  Edema (RD Assessment) None  Hair Reviewed  Eyes Reviewed  Mouth Unable to assess  Skin Reviewed  Nails Unable to assess    Diet Order:   Diet Order             Diet NPO time specified  Diet effective now                   EDUCATION NEEDS:   No education needs have been identified at this time  Skin:  Skin Assessment: Reviewed RN Assessment (head incision)  Last BM:  10/6 type 6  Height:   Ht Readings from Last  1 Encounters:  06/05/24 5' 7 (1.702 m)    Weight:   Wt Readings from Last 1 Encounters:  06/11/24 129.4 kg    BMI:  Body mass index is 44.68 kg/m.  Estimated Nutritional Needs:   Kcal:  1900-2100  Protein:  110-125 grams  Fluid:  >1.9 L/day  Madalyn Potters, MS, RD, LDN Clinical Dietitian  Please see AMiON for contact information.

## 2024-06-11 NOTE — Progress Notes (Signed)
 PT Cancellation Note  Patient Details Name: Justin Rasmussen MRN: [id number] DOB: 1956/04/15   Cancelled Treatment:    Reason Eval/Treat Not Completed: Patient not medically ready (back in ICU on BiPAP). RN recommends holding therapies today. Pt going to CT and then plans to wean from BiPAP. PT will f/u as appropriate.    Macario RAMAN, PT Acute Rehabilitation Services  Office [phone]    Macario SHAUNNA Soja 06/11/2024, 10:41 AM

## 2024-06-11 NOTE — Progress Notes (Signed)
 RT took patient off bipap for a trial off per MD. Patient currently on 4L [place] and showing no signs of distress at this time. RN at bedside and aware. RT will continue to monitor as needed.

## 2024-06-11 NOTE — Progress Notes (Signed)
 SLP Cancellation Note  Patient Details Name: Justin Rasmussen MRN: [id number] DOB: Jul 18, 1956   Cancelled treatment:       Reason Eval/Treat Not Completed: Medical issues which prohibited therapy (back in ICU on BiPAP). RN recommends holding therapies today. Pt going to CT and then plans to wean from BiPAP. SLP will f/u as able.    Damien Blumenthal, M.A., CCC-SLP Speech Language Pathology, Acute Rehabilitation Services  Secure Chat preferred [phone]  06/11/2024, 9:22 AM

## 2024-06-11 NOTE — Progress Notes (Signed)
 OT Cancellation Note  Patient Details Name: Justin Rasmussen MRN: [id number] DOB: September 05, 1956   Cancelled Treatment:    Reason Eval/Treat Not Completed: Patient not medically ready (Pt back in ICU on BiPAP. Discussed with RN, hold therapies today. Pt going to CT and then plans to wean from BiPAP.)  Lucie D Causey 06/11/2024, 9:15 AM

## 2024-06-11 NOTE — Progress Notes (Signed)
Stat  EEG complete - results pending.  

## 2024-06-11 NOTE — Progress Notes (Signed)
 Inpatient Rehab Admissions Coordinator:   Pt. Weaning from Bipap, not yet medically stable for CIR consult. Will rescreen once weaned and working with therapy.  Leita Kleine, MS, CCC-SLP Rehab Admissions Coordinator  [phone] (celll) [phone] (office)

## 2024-06-11 NOTE — Progress Notes (Signed)
 RT transported patient to CT and back with RN w/o complications.

## 2024-06-11 NOTE — Progress Notes (Signed)
    Providing Compassionate, Quality Care - Together   NEUROSURGERY PROGRESS NOTE     S: Transferred back to ICU o/n. On bipap.    O: EXAM:  BP (!) 101/56   Pulse (!) 58   Temp (!) 97.1 F (36.2 C) (Axillary)   Resp 14   Ht 5' 7 (1.702 m)   Wt 129.4 kg   SpO2 97%   BMI 44.68 kg/m     Eyes open spontaneously Nods to questions Follows commands LUE/LLE Incision c/d/I   ASSESSMENT:  68 y.o. s/p left frontal crani for resection of met, evac of hematoma     PLAN: -Continue Decadron  -Continue Keppra -Continue supportive care per CCM.     Camie Pickle, [facility]

## 2024-06-11 NOTE — Progress Notes (Signed)
 NAMEDamian Rasmussen, MRN:  [id number], DOB:  1956-05-17, LOS: 9 ADMISSION DATE:  06/02/2024, CONSULTATION DATE:  9/27 REFERRING MD:  Michaela, neuro CHIEF COMPLAINT: ICH   History of Present Illness:  68 year old male with past medical history of pulmonary hypertension, hyperlipidemia, OSA on CPAP at night, HFrEF, COPD, melanoma currently on nivolumab  presented to the ED this evening with right-sided weakness and aphasia. LKW around 9 AM.  Wife in the emergency department reported that around noon he seemed tired and not speaking much.  Also noted to be a little confused.  On arrival to ED, noted to have right-sided weakness, aphasia and code stroke was activated.  CT head with left frontal lobe 4 x 6 x 4 cm hemorrhage with concern for underlying mass, vasogenic edema and 9 mm midline shift.  CT angio with no LVO.  Labs with hemoglobin 12.3, sodium 140, creatinine 1.26, ethanol negative, glucose normal.  He was evaluated by neurology who admitted him to ICU.  CCM with cross coverage consult.  Pertinent  Medical History  pulmonary hypertension, hyperlipidemia, OSA on CPAP at night, HFrEF, COPD, melanoma currently on nivolumab   Past Medical History:  Diagnosis Date   2019 novel coronavirus disease (COVID-19) 07/2019   COPD (chronic obstructive pulmonary disease) (HCC)    HFrEF (heart failure with reduced ejection fraction) (HCC)    Melanoma (HCC)    Nonischemic cardiomyopathy (HCC)    OSA (obstructive sleep apnea)    Pulmonary hypertension (HCC)    WHO group II    Significant Hospital Events: Including procedures, antibiotic start and stop dates in addition to other pertinent events   9/27: Admit to ICU s/p code stroke with left frontal lobe ICH and concern for mass 9/30: Craniotomy with tumor resection and hematoma evacuation 10/1: Extubated 10/2: Unable to swallow plans for Cortrak tomorrow 10/3: Remain on 5 L nasal cannula oxygen, generalized weak, afebrile 10/5 transfer out of ICU  but later that night had hypotension and AMS prompting transfer back to ICU and starting BiPAP and NE  Interim History / Subjective:  Remains encephalopathic this AM on BiPAP. ABG from this AM OK Repeat CT head pending. EEG negative. NE at 5  Objective   Blood pressure 102/62, pulse 64, temperature (!) 97.1 F (36.2 C), temperature source Axillary, resp. rate 15, height 5' 7 (1.702 m), weight 129.4 kg, SpO2 97%.    FiO2 (%):  [40 %] 40 % PEEP:  [5 cmH20] 5 cmH20 Pressure Support:  [12 cmH20] 12 cmH20   Intake/Output Summary (Last 24 hours) at 06/11/2024 0928 Last data filed at 06/11/2024 0800 Gross per 24 hour  Intake 1948.74 ml  Output 1150 ml  Net 798.74 ml   Filed Weights   06/10/24 0500 06/10/24 2233 06/11/24 0453  Weight: (!) 140.6 kg 131.3 kg 129.4 kg    Examination: General: Acute on chronically ill-appearing morbidly obese male, on BiPAP HEENT: [place]/AT, eyes anicteric. BiPAP in place. Cortrak intact. Neuro: Somnolent but will open eyes to noxious stimuli. Intermittently follows some basic commands but requires frequent prompting Chest: Basilar crackles Heart: Regular rate and rhythm, no murmurs or gallops Abdomen: Soft, nontender, nondistended, bowel sounds present   Assessment & Plan:   Acute metabolic encephalopathy - EEG 89/3 negative Acute left frontal intraparenchymal hemorrhage due to hemorrhagic tumor Cerebral (Vasogenic) edema with brain compression Single 2.5 cm enhancing tumor (melanoma) with surrounding hemorrhage.  No evidence of other metastasis S/p craniotomy with hematoma evacuation on 06/05/2024 Induced hypernatremia Melanoma on immunotherapy Dysphagia Hypertension,  controlled Hypotension requiring NE - unclear etiology but favor aspiration with poor mental status Acute kidney injury, likely due to dehydration COPD, not in exacerbation OSA on CPAP at night  Morbid obesity Pulmonary hypertension, previously on home O2, now prn  Acute  hypoxic/hypercapnic respiratory failure due to bilateral lower lobe atelectasis Chronic right hemidiaphragm elevation Chronic right ventricular HFrEF in the setting of pulmonary hypertension Hyperlipidemia Prediabetes  Repeat CT head again today to evaluate for changes Continue Decadron  Continue FWF, 3% saline on hold Continue empiric Zosyn Continue NE, wean as able Start Midodrine 10mg  TID Continue BiPAP, try to give him a break today if possible after CT Needs to ensure nightly compliance with CPAP! SSI   CC time: 30 min.  Sammi Gore, PA - [facility] For pager details, please see AMION or use Epic chat  After 1900, please call [facility] for cross coverage needs 06/11/2024, 9:37 AM

## 2024-06-11 NOTE — Procedures (Addendum)
 Patient Name: Justin Rasmussen  MRN: [id number]  Epilepsy Attending: Arlin MALVA Krebs  Referring Physician/Provider: Claudene Toribio BROCKS, MD  Date: 06/11/2024 Duration: 22.30 mins  Patient history: 68yo M with ams. EEG to evaluate for seizure  Level of alertness: Awake/ lethargic   AEDs during EEG study: LEV  Technical aspects: This EEG study was done with scalp electrodes positioned according to the 10-20 International system of electrode placement. Electrical activity was reviewed with band pass filter of 1-70Hz , sensitivity of 7 uV/mm, display speed of 37mm/sec with a 60Hz  notched filter applied as appropriate. EEG data were recorded continuously and digitally stored.  Video monitoring was available and reviewed as appropriate.  Description: EEG showed continuous generalized predominantly 5 to 6 Hz theta slowing admixed with intermittent 2-3hz  delta slowing. There is also sharply contoured 3-5hz  theta-delta slowing in left frontal region consistent with breach artifact. Hyperventilation and photic stimulation were not performed.     ABNORMALITY - Breach artifact, left frontal - Continuous slow, generalized  IMPRESSION: This study is suggestive of cortical dysfunction in left frontal region consistent with underlying craniotomy. Additionally there is non specific generalized cerebral dysfunction/ encephalopathy. No seizures or definite epileptiform discharges were seen throughout the recording.  EEG appears to have improved compared to previous study on 06/08/2024  Evett Kassa O Rox Mcgriff

## 2024-06-11 NOTE — Progress Notes (Signed)
 06/11/2024 Persistent vasoplegia now on levo  A little less responsive, still grabs my hand with left arm with sternal rub, BIPAP mechanics okay, ABG okay  1 more CT head EEG when techs get in Restart FWF Check UA although really not fitting with sepsis; already on zosyn Check TSH If continues may need MRI?  Hopefully just hypoactive delirium  Wife updated  Rolan Sharps MD PCCM

## 2024-06-11 NOTE — Progress Notes (Signed)
 Pt has not voided since arrival to unit.  Last charted u/o was 10/5 at 1630.  Bladder scanned pt, read 456.  Notified CCM.  Order for I&O placed.

## 2024-06-12 DIAGNOSIS — C7931 Secondary malignant neoplasm of brain: Secondary | ICD-10-CM | POA: Diagnosis not present

## 2024-06-12 DIAGNOSIS — E870 Hyperosmolality and hypernatremia: Secondary | ICD-10-CM | POA: Diagnosis not present

## 2024-06-12 DIAGNOSIS — I629 Nontraumatic intracranial hemorrhage, unspecified: Secondary | ICD-10-CM | POA: Diagnosis not present

## 2024-06-12 DIAGNOSIS — N179 Acute kidney failure, unspecified: Secondary | ICD-10-CM | POA: Diagnosis not present

## 2024-06-12 LAB — BASIC METABOLIC PANEL WITH GFR
Anion gap: 7 (ref 5–15)
Anion gap: 12 (ref 5–15)
BUN: 89 mg/dL — ABNORMAL HIGH (ref 8–23)
BUN: 88 mg/dL — ABNORMAL HIGH (ref 8–23)
CO2: 27 mmol/L (ref 22–32)
CO2: 26 mmol/L (ref 22–32)
Calcium: 8.3 mg/dL — ABNORMAL LOW (ref 8.9–10.3)
Calcium: 8.3 mg/dL — ABNORMAL LOW (ref 8.9–10.3)
Chloride: 124 mmol/L — ABNORMAL HIGH (ref 98–111)
Chloride: 120 mmol/L — ABNORMAL HIGH (ref 98–111)
Creatinine, Ser: 1.78 mg/dL — ABNORMAL HIGH (ref 0.61–1.24)
Creatinine, Ser: 1.6 mg/dL — ABNORMAL HIGH (ref 0.61–1.24)
GFR, Estimated: 42 mL/min — ABNORMAL LOW (ref >60)
GFR, Estimated: 47 mL/min — ABNORMAL LOW (ref >60)
Glucose, Bld: 133 mg/dL — ABNORMAL HIGH (ref 70–99)
Glucose, Bld: 147 mg/dL — ABNORMAL HIGH (ref 70–99)
Potassium: 5.3 mmol/L — ABNORMAL HIGH (ref 3.5–5.1)
Potassium: 4.7 mmol/L (ref 3.5–5.1)
Sodium: 158 mmol/L — ABNORMAL HIGH (ref 135–145)
Sodium: 158 mmol/L — ABNORMAL HIGH (ref 135–145)

## 2024-06-12 LAB — GLUCOSE, CAPILLARY
Glucose-Capillary: 117 mg/dL — ABNORMAL HIGH (ref 70–99)
Glucose-Capillary: 123 mg/dL — ABNORMAL HIGH (ref 70–99)
Glucose-Capillary: 142 mg/dL — ABNORMAL HIGH (ref 70–99)
Glucose-Capillary: 162 mg/dL — ABNORMAL HIGH (ref 70–99)
Glucose-Capillary: 164 mg/dL — ABNORMAL HIGH (ref 70–99)
Glucose-Capillary: 168 mg/dL — ABNORMAL HIGH (ref 70–99)

## 2024-06-12 LAB — CBC
HCT: 47.3 % (ref 39.0–52.0)
Hemoglobin: 14.1 g/dL (ref 13.0–17.0)
MCH: 26.3 pg (ref 26.0–34.0)
MCHC: 29.8 g/dL — ABNORMAL LOW (ref 30.0–36.0)
MCV: 88.1 fL (ref 80.0–100.0)
Platelets: 167 K/uL (ref 150–400)
RBC: 5.37 MIL/uL (ref 4.22–5.81)
RDW: 16 % — ABNORMAL HIGH (ref 11.5–15.5)
WBC: 16.3 K/uL — ABNORMAL HIGH (ref 4.0–10.5)
nRBC: 0 % (ref 0.0–0.2)

## 2024-06-12 MED ORDER — LABETALOL HCL 5 MG/ML IV SOLN
10.0000 mg | INTRAVENOUS | Status: DC | PRN
  Administered 2024-06-14 – 2024-06-15 (×2): 10 mg via INTRAVENOUS
  Filled 2024-06-12 (×2): qty 4

## 2024-06-12 NOTE — Progress Notes (Signed)
 Speech Language Pathology Treatment: Dysphagia;Cognitive-Linguistic  Patient Details Name: Justin Rasmussen MRN: [id number] DOB: 13-Dec-1955 Today's Date: 06/12/2024 Time: 1110-1150 SLP Time Calculation (min) (ACUTE ONLY): 40 min  Assessment / Plan / Recommendation Clinical Impression  Co-treatment with PT and OT to maximize alertness for ongoing cognitive-linguistic and dysphagia treatment. He is alert when sitting upright in bed but requires Max cueing to initiate responses to questions, which he does by nodding his head with ~25% accuracy when they pertain to basic biographical concepts. He could not be prompted to shake his head no appropriately, which his wife states has been consistent with what she has observed. Following commands continues to be difficult even with this L side. He made limited attempts to grasp and use functional items during familiar tasks. He could not initiate sipping via straw or cup and when given ice chips or siphoned straw sips of water, he held the boluses orally and did not initiate oral transit. He cannot form a labial seal so the majority of each bolus is lost anteriorly and a pharyngeal swallow response was not observed. Recommend NPO with Cortrak in place for nutrition and medication administration. SLP will f/u.    HPI HPI: 68 y.o. male history of melanoma currently in the process of getting immunotherapy presented 06/02/24 with altered mental status, aphasia, and Rt sided weakness. Initial CT scan showing a left frontal hemorrhage. MRI showed underlying mass. S/p L frontal crani for tumor resection and evacuation of L frontal hematoma & transbronchial needle aspiration 9/30. Extubated 10/1. Questionable seizure activity noted during hospitalization. Became hypotensive and transferred to ICU on BiPAP 10/5. Repeat CTH stable. PMH-COPD, HF, cardiomyopathy, pulmonary HTN, OSA, melanoma on immunotherapy      SLP Plan  Continue with current plan of care           Recommendations  Diet recommendations: NPO Medication Administration: Via alternative means                  Oral care QID   Frequent or constant Supervision/Assistance Aphasia (R47.01);Cognitive communication deficit (R41.841);Dysphagia, unspecified (R13.10)     Continue with current plan of care     Damien Blumenthal, M.A., CCC-SLP Speech Language Pathology, Acute Rehabilitation Services  Secure Chat preferred [phone]   06/12/2024, 1:23 PM

## 2024-06-12 NOTE — Progress Notes (Signed)
 NAMEJustus Rasmussen, MRN:  [id number], DOB:  1956/07/30, LOS: 10 ADMISSION DATE:  06/02/2024, CONSULTATION DATE:  9/27 REFERRING MD:  Michaela, neuro CHIEF COMPLAINT: ICH   History of Present Illness:  68 year old male with past medical history of pulmonary hypertension, hyperlipidemia, OSA on CPAP at night, HFrEF, COPD, melanoma currently on nivolumab  presented to the ED this evening with right-sided weakness and aphasia. LKW around 9 AM.  Wife in the emergency department reported that around noon he seemed tired and not speaking much.  Also noted to be a little confused.  On arrival to ED, noted to have right-sided weakness, aphasia and code stroke was activated.  CT head with left frontal lobe 4 x 6 x 4 cm hemorrhage with concern for underlying mass, vasogenic edema and 9 mm midline shift.  CT angio with no LVO.  Labs with hemoglobin 12.3, sodium 140, creatinine 1.26, ethanol negative, glucose normal.  He was evaluated by neurology who admitted him to ICU.  CCM with cross coverage consult.  Pertinent  Medical History  pulmonary hypertension, hyperlipidemia, OSA on CPAP at night, HFrEF, COPD, melanoma currently on nivolumab   Past Medical History:  Diagnosis Date   2019 novel coronavirus disease (COVID-19) 07/2019   COPD (chronic obstructive pulmonary disease) (HCC)    HFrEF (heart failure with reduced ejection fraction) (HCC)    Melanoma (HCC)    Nonischemic cardiomyopathy (HCC)    OSA (obstructive sleep apnea)    Pulmonary hypertension (HCC)    WHO group II    Significant Hospital Events: Including procedures, antibiotic start and stop dates in addition to other pertinent events   9/27: Admit to ICU s/p code stroke with left frontal lobe ICH and concern for mass 9/30: Craniotomy with tumor resection and hematoma evacuation 10/1: Extubated 10/2: Unable to swallow plans for Cortrak tomorrow 10/3: Remain on 5 L nasal cannula oxygen, generalized weak, afebrile 10/5 transfer out of  ICU but later that night had hypotension and AMS prompting transfer back to ICU and starting BiPAP and NE 10/6 remains on low-dose Levophed, started on midodrine, came off of BiPAP  Interim History / Subjective:  Patient is off vasopressor support Wore BiPAP overnight, currently on 4 L nasal cannula oxygen, tolerating well Has very weak cough and gag  Objective   Blood pressure 113/76, pulse 63, temperature 97.9 F (36.6 C), temperature source Axillary, resp. rate 14, height 5' 7 (1.702 m), weight 129.7 kg, SpO2 98%.    Vent Mode: BIPAP;PSV FiO2 (%):  [40 %] 40 % Set Rate:  [12 bmp] 12 bmp PEEP:  [5 cmH20] 5 cmH20 Pressure Support:  [10 cmH20] 10 cmH20 Plateau Pressure:  [5 cmH20] 5 cmH20   Intake/Output Summary (Last 24 hours) at 06/12/2024 0931 Last data filed at 06/12/2024 0802 Gross per 24 hour  Intake 2178.53 ml  Output 2500 ml  Net -321.47 ml   Filed Weights   06/10/24 2233 06/11/24 0453 06/12/24 0500  Weight: 131.3 kg 129.4 kg 129.7 kg    Examination: General: Acute on chronically ill-appearing elderly morbidly obese male, lying on the bed HEENT: [place]/AT, eyes anicteric.  moist mucus membranes. Cortrak in place Neuro: Awake, generalized weak, following simple commands, absent cough and gag Chest: Reduced air entry at the bases right more than left, no wheezes or rhonchi Heart: Regular rate and rhythm, no murmurs or gallops Abdomen: Soft, nontender, nondistended, bowel sounds present  Labs and images reviewed   Assessment & Plan:  Acute left intraparenchymal hemorrhage in the setting  of hemorrhagic tumor caused by metastatic melanoma, status post craniotomy and evacuation of hematoma Metastatic melanoma on immunotherapy Surrounding cerebral edema, improving Induced hypernatremia, improving Acute metabolic encephalopathy in the setting of hypercapnia Severe sepsis with septic shock with aspiration pneumonia, not POA Morbid obesity OSA on CPAP at night COPD, not in  exacerbation Acute respiratory failure with hypoxia and hypercapnia Chronic right hemidiaphragm elevation Morbid obesity Acute kidney injury due to severe dehydration Dysphagia Chronic right ventricular HFrEF in the setting of pulmonary hypertension Prediabetes Hyperlipidemia  Repeat head CT showed stable postop changes Continue Decadron  Outpatient follow-up with hematology Serum sodium is slowly trending down, currently at 158 Continue free water flushes Shock has resolved, patient is off vasopressors Continue IV antibiotics with IV Zosyn Patient needs to wear CPAP/BiPAP every night Continue PT/OT evaluation, out of bed to chair today Continue to titrate nasal cannula oxygen with O2 sat goal 92% Serum creatinine continue to improve, closely monitor, avoid nephrotoxic agent Continue tube feeds Hold diuretics Continue insulin with CBG goal 140-180     Valinda Novas, MD [place] [facility] See Amion for pager If no response to pager, please call [phone] until 7pm After 7pm, Please call E-link [phone]

## 2024-06-12 NOTE — Progress Notes (Signed)

## 2024-06-12 NOTE — Progress Notes (Signed)
 Occupational Therapy Treatment Patient Details Name: Justin Rasmussen MRN: [id number] DOB: August 07, 1956 Today's Date: 06/12/2024   History of present illness 68 y.o. male history of melanoma currently in the process of getting immunotherapy presented 06/02/24 with altered mental status, aphasia, and Rt sided weakness. Initial CT scan showing a left frontal hemorrhage. MRI showed underlying mass. S/p L frontal crani for tumor resection and evacuation of L frontal hematoma & transbronchial needle aspiration 9/30. Extubated 10/1. Questionable seizure activity noted during hospitalization. Becam hypotensive with respiratory decline required BiPAP and transfer to ICU; PMH-COPD, HF, cardiomyopathy, pulmonary HTN, OSA, melanoma on immunotherapy   OT comments  Pt is making limited progress towards their acute OT goals. Pt seen with PT and SLP as a holistic approach towards therapeutic goals. Overall he continues to need total A +2-3 for all aspects of mobility and total A for sitting balance. He followed ~25% of simple 1 step cues and sustained visual attention at midline 50% of the time. R inattention noted. Pt move RUE against gravity 1x, but unable to elicit any activation of extremity to command. He gave 1 accurate head nod, otherwise no attempts of communication noted. OT to continue to follow acutely to facilitate progress towards established goals. Pt will continue to benefit from intensive inpatient follow up therapy, >3 hours/day after discharge.       If plan is discharge home, recommend the following:  Two people to help with walking and/or transfers;Two people to help with bathing/dressing/bathroom;Direct supervision/assist for medications management;Direct supervision/assist for financial management;Assist for transportation;Supervision due to cognitive status;Help with stairs or ramp for entrance   Equipment Recommendations  Other (comment)    Recommendations for Other Services PT consult;Speech  consult    Precautions / Restrictions Precautions Precautions: Fall;Other (comment) Recall of Precautions/Restrictions: Impaired Precaution/Restrictions Comments: watch HR, SBP < 160, left mitts Restrictions Weight Bearing Restrictions Per Provider Order: No       Mobility Bed Mobility Overal bed mobility: Needs Assistance Bed Mobility: Rolling, Sidelying to Sit, Sit to Sidelying Rolling: Total assist, +2 for physical assistance, +2 for safety/equipment Sidelying to sit: Total assist, +2 for physical assistance, +2 for safety/equipment     Sit to sidelying: Total assist, +2 for safety/equipment, +2 for physical assistance      Transfers                   General transfer comment: unable to safely attempt     Balance Overall balance assessment: Needs assistance Sitting-balance support: Single extremity supported, No upper extremity supported, Feet supported Sitting balance-Leahy Scale: Zero Sitting balance - Comments: Pt leans to the R max-total A Postural control: Right lateral lean     Standing balance comment: Unable to safely test due to BP rising with sitting EOB, pt returned to supine due to BP remaining over BP parameters even with increase of IV BP meds during session                           ADL either performed or assessed with clinical judgement   ADL                                         General ADL Comments: total A +2-3    Extremity/Trunk Assessment Upper Extremity Assessment Upper Extremity Assessment: RUE deficits/detail;LUE deficits/detail RUE Deficits / Details: pt moved elbow, wrist and  hand towards mouth 1x to intrinsic stimuli but required hand over hand to reach chin. Pt did not follow any commands to assess RUE LUE Deficits / Details: he was able to bring hand to mouth with singificant cues and give therapist a high five otherwise he did not follow commands to assess LUE   Lower Extremity  Assessment Lower Extremity Assessment: Defer to PT evaluation        Vision   Vision Assessment?: Yes;Vision impaired- to be further tested in functional context Eye Alignment: Impaired (comment) Ocular Range of Motion: Impaired-to be further tested in functional context Alignment/Gaze Preference: Within Defined Limits Diplopia Assessment: Other (comment) Additional Comments: dysconjugate gaze, R eye not tracking as well. pt only brough eyes to R envouronment 1x. difficult to assess due to impaired cognition   Perception Perception Perception: Not tested   Praxis Praxis Praxis: Not tested   Communication Communication Communication: Impaired Factors Affecting Communication: Difficulty expressing self   Cognition Arousal: Alert Behavior During Therapy: Flat affect Cognition: Cognition impaired, Difficult to assess             OT - Cognition Comments: followed some simple 1 step commands (lift your head, high five), otherwise not initiating movement for command following. He gave 1 head nodd to orientation questions, otherwise no effort for communication noted. cognition difficult to assess.                 Following commands: Impaired Following commands impaired: Follows one step commands inconsistently, Follows one step commands with increased time      Cueing   Cueing Techniques: Verbal cues, Gestural cues, Tactile cues, Visual cues  Exercises      Shoulder Instructions       General Comments VSS on 4L, wife present    Pertinent Vitals/ Pain       Pain Assessment Pain Assessment: Faces Faces Pain Scale: No hurt Pain Intervention(s): Monitored during session  Home Living                                          Prior Functioning/Environment              Frequency  Min 2X/week        Progress Toward Goals  OT Goals(current goals can now be found in the care plan section)     Acute Rehab OT Goals Patient Stated Goal:  per wife for pt to get better OT Goal Formulation: With family Time For Goal Achievement: 06/22/24 Potential to Achieve Goals: Good ADL Goals Pt Will Perform Grooming: with contact guard assist;sitting Pt Will Perform Upper Body Dressing: with min assist;with caregiver independent in assisting;sitting Pt Will Perform Lower Body Dressing: with mod assist;with caregiver independent in assisting;sit to/from stand Pt Will Transfer to Toilet: with mod assist;with +2 assist;stand pivot transfer;bedside commode Pt Will Perform Toileting - Clothing Manipulation and hygiene: with max assist;sit to/from stand Pt/caregiver will Perform Home Exercise Program: Right Upper extremity;With minimal assist Additional ADL Goal #1: Pt will maintain seated balance with GCA for participation in ADL for approx 3-5 min.  Plan      Co-evaluation    PT/OT/SLP Co-Evaluation/Treatment: Yes Reason for Co-Treatment: Complexity of the patient's impairments (multi-system involvement);For patient/therapist safety;Necessary to address cognition/behavior during functional activity;To address functional/ADL transfers PT goals addressed during session: Balance;Strengthening/ROM;Mobility/safety with mobility OT goals addressed during session: ADL's and self-care;Strengthening/ROM SLP goals  addressed during session: Swallowing;Cognition;Communication    AM-PAC OT 6 Clicks Daily Activity     Outcome Measure   Help from another person eating meals?: Total Help from another person taking care of personal grooming?: Total Help from another person toileting, which includes using toliet, bedpan, or urinal?: Total Help from another person bathing (including washing, rinsing, drying)?: Total Help from another person to put on and taking off regular upper body clothing?: Total Help from another person to put on and taking off regular lower body clothing?: Total 6 Click Score: 6    End of Session Equipment Utilized During  Treatment: Oxygen  OT Visit Diagnosis: Unsteadiness on feet (R26.81);Other abnormalities of gait and mobility (R26.89);Muscle weakness (generalized) (M62.81);Low vision, both eyes (H54.2);Other symptoms and signs involving the nervous system (R29.898);Other symptoms and signs involving cognitive function;Cognitive communication deficit (R41.841);Hemiplegia and hemiparesis Symptoms and signs involving cognitive functions: Other Nontraumatic ICH Hemiplegia - Right/Left: Right Hemiplegia - dominant/non-dominant: Dominant Hemiplegia - caused by: Other Nontraumatic intracranial hemorrhage   Activity Tolerance Treatment limited secondary to medical complications (Comment)   Patient Left in bed;with call bell/phone within reach;with bed alarm set;with family/visitor present   Nurse Communication Mobility status;Need for lift equipment;Precautions        Time: 1112-1150 OT Time Calculation (min): 38 min  Charges: OT General Charges $OT Visit: 1 Visit OT Treatments $Therapeutic Activity: 8-22 mins  Lucie Kendall, OTR/L Acute Rehabilitation Services Office [phone] Secure Chat Communication Preferred   [name] 06/12/2024, 1:37 PM

## 2024-06-12 NOTE — Progress Notes (Signed)
 Physical Therapy Treatment Patient Details Name: Justin Rasmussen MRN: [id number] DOB: 12-May-1956 Today's Date: 06/12/2024   History of Present Illness 68 y.o. male history of melanoma currently in the process of getting immunotherapy presented 06/02/24 with altered mental status, aphasia, and Rt sided weakness. Initial CT scan showing a left frontal hemorrhage. MRI showed underlying mass. S/p L frontal crani for tumor resection and evacuation of L frontal hematoma & transbronchial needle aspiration 9/30. Extubated 10/1. Questionable seizure activity noted during hospitalization. Becam hypotensive with respiratory decline required BiPAP and transfer to ICU; PMH-COPD, HF, cardiomyopathy, pulmonary HTN, OSA, melanoma on immunotherapy    PT Comments  Co-treatment with OT and SLP for additional skilled, therapeutic hands and to maximize pt's alertness for swallow assessment. Patient alert throughout session, however with decr ability to follow commands and assist with mobility. Pt +2-3 total assist for rolling Rt and lt, supine to sit with HOB elevated, to maintain sitting EOB, and to return to supine. Using head nods appropriately ~25% of time. More limited command following even with LUE and LLE (his stronger side).    If plan is discharge home, recommend the following: Two people to help with walking and/or transfers;Two people to help with bathing/dressing/bathroom;Assistance with cooking/housework;Assistance with feeding;Direct supervision/assist for medications management;Direct supervision/assist for financial management;Assist for transportation;Help with stairs or ramp for entrance;Supervision due to cognitive status   Can travel by private vehicle        Equipment Recommendations  Hospital bed;Hoyer lift (pending progress)    Recommendations for Other Services       Precautions / Restrictions Precautions Precautions: Fall;Other (comment) Recall of Precautions/Restrictions:  Impaired Precaution/Restrictions Comments: watch HR, SBP < 160, left mitts Restrictions Weight Bearing Restrictions Per Provider Order: No     Mobility  Bed Mobility Overal bed mobility: Needs Assistance Bed Mobility: Supine to Sit, Sit to Supine, Rolling Rolling: +2 for physical assistance, +2 for safety/equipment, Total assist   Supine to sit: HOB elevated, +2 for physical assistance, +2 for safety/equipment, Total assist (+3) Sit to supine: Total assist, +2 for physical assistance, +2 for safety/equipment (+3)   General bed mobility comments: rolling Rt and Lt prior to EOB for pericare (+3 assist); HOB elevated with pivot to sit EOB with +3 assist; return to side to supine +3 assist    Transfers                   General transfer comment: unable to safely attempt    Ambulation/Gait                   Stairs             Wheelchair Mobility     Tilt Bed    Modified Rankin (Stroke Patients Only) Modified Rankin (Stroke Patients Only) Pre-Morbid Rankin Score: No symptoms Modified Rankin: Severe disability     Balance Overall balance assessment: Needs assistance Sitting-balance support: Single extremity supported, No upper extremity supported, Feet supported Sitting balance-Leahy Scale: Zero Sitting balance - Comments: Pt leans to the R max-total A Postural control: Right lateral lean                                  Communication Communication Communication: Impaired Factors Affecting Communication: Difficulty expressing self (nonverbal this session)  Cognition Arousal: Alert Behavior During Therapy: Flat affect   PT - Cognitive impairments: Difficult to assess, Initiation Difficult to assess due to: Impaired communication  PT - Cognition Comments: Pt nonverbal throughout session, only answering questions via head nods/shakes. Able to follows simple multi-modal cues inconsistently with L side, ~25%  of time.  Poor attention to R side. Pt smiled several times Following commands: Impaired Following commands impaired: Follows one step commands inconsistently, Follows one step commands with increased time (approx 25% of the time)    Cueing Cueing Techniques: Verbal cues, Gestural cues, Tactile cues, Visual cues  Exercises Other Exercises Other Exercises: pt able to perform partial LLE LAQ on command; PROM LAQ on the RLE with no active movement noted    General Comments General comments (skin integrity, edema, etc.): Wife present. Briefly reviewed performing heel cord stretch and hip IR stretch on LLE      Pertinent Vitals/Pain Pain Assessment Pain Assessment: Faces Faces Pain Scale: No hurt    Home Living                          Prior Function            PT Goals (current goals can now be found in the care plan section) Acute Rehab PT Goals Patient Stated Goal: unable to state; wife is hoping for pt to improve Time For Goal Achievement: 06/21/24 Potential to Achieve Goals: Good Progress towards PT goals: Not progressing toward goals - comment (alert, but unable to assist with mobility)    Frequency    Min 2X/week      PT Plan      Co-evaluation PT/OT/SLP Co-Evaluation/Treatment: Yes Reason for Co-Treatment: Complexity of the patient's impairments (multi-system involvement);For patient/therapist safety;Necessary to address cognition/behavior during functional activity;To address functional/ADL transfers PT goals addressed during session: Balance;Strengthening/ROM;Mobility/safety with mobility        AM-PAC PT 6 Clicks Mobility   Outcome Measure  Help needed turning from your back to your side while in a flat bed without using bedrails?: Total Help needed moving from lying on your back to sitting on the side of a flat bed without using bedrails?: Total Help needed moving to and from a bed to a chair (including a wheelchair)?: Total Help needed  standing up from a chair using your arms (e.g., wheelchair or bedside chair)?: Total Help needed to walk in hospital room?: Total Help needed climbing 3-5 steps with a railing? : Total 6 Click Score: 6    End of Session Equipment Utilized During Treatment: Oxygen Activity Tolerance: Patient limited by fatigue Patient left: in bed;with call bell/phone within reach;with restraints reapplied;with family/visitor present (L mitt) Nurse Communication: Mobility status;Other (comment) (need to restart tube feed) PT Visit Diagnosis: Muscle weakness (generalized) (M62.81);Difficulty in walking, not elsewhere classified (R26.2);Other symptoms and signs involving the nervous system (R29.898);Hemiplegia and hemiparesis Hemiplegia - Right/Left: Right Hemiplegia - dominant/non-dominant: Dominant Hemiplegia - caused by: Other Nontraumatic intracranial hemorrhage (tumor)     Time: 1110-1150 PT Time Calculation (min) (ACUTE ONLY): 40 min  Charges:    $Neuromuscular Re-education: 23-37 mins PT General Charges $$ ACUTE PT VISIT: 1 Visit                      Macario RAMAN, PT Acute Rehabilitation Services  Office [phone]    Macario SHAUNNA Soja 06/12/2024, 12:11 PM

## 2024-06-12 NOTE — TOC Progression Note (Signed)
 Transition of Care [facility]) - Progression Note    Patient Details  Name: Justin Rasmussen MRN: [id number] Date of Birth: 04/14/1956  [facility] CM/SW Contact  Inocente GORMAN Kindle, LCSW Phone Number: 06/12/2024, 5:53 PM  Clinical Narrative:    CSW continuing to follow.    Expected Discharge Plan: IP Rehab Facility Barriers to Discharge: Continued Medical Work up               Expected Discharge Plan and Services   Discharge Planning Services: CM Consult   Living arrangements for the past 2 months: Single Family Home                                       Social Drivers of Health (SDOH) Interventions SDOH Screenings   Food Insecurity: No Food Insecurity (06/04/2024)  Housing: Low Risk  (06/04/2024)  Transportation Needs: No Transportation Needs (06/04/2024)  Utilities: Not At Risk (06/04/2024)  Depression (PHQ2-9): Low Risk  (07/12/2023)  Physical Activity: Unknown (06/01/2019)  Social Connections: Unknown (06/04/2024)  Stress: No Stress Concern Present (06/01/2019)  Tobacco Use: Medium Risk (06/05/2024)    Readmission Risk Interventions     No data to display

## 2024-06-12 NOTE — Progress Notes (Signed)
 Phlebotomy unable to obtain AM labs d/t poor vasculature. Phlebotomist states she will have another phlebotomist attempt to draw labs shortly.

## 2024-06-12 NOTE — Progress Notes (Signed)
    Providing Compassionate, Quality Care - Together   NEUROSURGERY PROGRESS NOTE     S: Off bipap and pressors.    O: EXAM:  BP 113/76 (BP Location: Left Wrist)   Pulse 63   Temp 97.9 F (36.6 C) (Axillary)   Resp 14   Ht 5' 7 (1.702 m)   Wt 129.7 kg   SpO2 98%   BMI 44.78 kg/m     Awake, alert.  FCx4 Incision c/d/I   ASSESSMENT:  68 y.o. s/p left frontal crani for resection of met, evac of hematoma     PLAN: -Continue Decadron  -Continue Keppra -Continue supportive care per CCM.    Camie Pickle, [facility]

## 2024-06-12 NOTE — Plan of Care (Signed)
  Problem: Self-Care: Goal: Ability to participate in self-care as condition permits will improve Outcome: Progressing   Problem: Nutrition: Goal: Risk of aspiration will decrease Outcome: Progressing Goal: Dietary intake will improve Outcome: Progressing   Problem: Education: Goal: Knowledge of General Education information will improve Description: Including pain rating scale, medication(s)/side effects and non-pharmacologic comfort measures Outcome: Progressing   Problem: Clinical Measurements: Goal: Ability to maintain clinical measurements within normal limits will improve Outcome: Progressing Goal: Respiratory complications will improve Outcome: Progressing Goal: Cardiovascular complication will be avoided Outcome: Progressing   Problem: Nutrition: Goal: Adequate nutrition will be maintained Outcome: Progressing   Problem: Self-Care: Goal: Verbalization of feelings and concerns over difficulty with self-care will improve Outcome: Not Progressing

## 2024-06-13 DIAGNOSIS — N179 Acute kidney failure, unspecified: Secondary | ICD-10-CM | POA: Diagnosis not present

## 2024-06-13 DIAGNOSIS — I629 Nontraumatic intracranial hemorrhage, unspecified: Secondary | ICD-10-CM | POA: Diagnosis not present

## 2024-06-13 DIAGNOSIS — C7931 Secondary malignant neoplasm of brain: Secondary | ICD-10-CM | POA: Diagnosis not present

## 2024-06-13 DIAGNOSIS — I10 Essential (primary) hypertension: Secondary | ICD-10-CM | POA: Diagnosis not present

## 2024-06-13 LAB — BASIC METABOLIC PANEL WITH GFR
Anion gap: 9 (ref 5–15)
BUN: 76 mg/dL — ABNORMAL HIGH (ref 8–23)
CO2: 24 mmol/L (ref 22–32)
Calcium: 8.2 mg/dL — ABNORMAL LOW (ref 8.9–10.3)
Chloride: 118 mmol/L — ABNORMAL HIGH (ref 98–111)
Creatinine, Ser: 1.37 mg/dL — ABNORMAL HIGH (ref 0.61–1.24)
GFR, Estimated: 57 mL/min — ABNORMAL LOW (ref >60)
Glucose, Bld: 220 mg/dL — ABNORMAL HIGH (ref 70–99)
Potassium: 4.6 mmol/L (ref 3.5–5.1)
Sodium: 151 mmol/L — ABNORMAL HIGH (ref 135–145)

## 2024-06-13 LAB — GLUCOSE, CAPILLARY
Glucose-Capillary: 209 mg/dL — ABNORMAL HIGH (ref 70–99)
Glucose-Capillary: 173 mg/dL — ABNORMAL HIGH (ref 70–99)
Glucose-Capillary: 176 mg/dL — ABNORMAL HIGH (ref 70–99)
Glucose-Capillary: 218 mg/dL — ABNORMAL HIGH (ref 70–99)
Glucose-Capillary: 245 mg/dL — ABNORMAL HIGH (ref 70–99)

## 2024-06-13 LAB — CBC
HCT: 45 % (ref 39.0–52.0)
Hemoglobin: 13.4 g/dL (ref 13.0–17.0)
MCH: 25.8 pg — ABNORMAL LOW (ref 26.0–34.0)
MCHC: 29.8 g/dL — ABNORMAL LOW (ref 30.0–36.0)
MCV: 86.7 fL (ref 80.0–100.0)
Platelets: 152 K/uL (ref 150–400)
RBC: 5.19 MIL/uL (ref 4.22–5.81)
RDW: 15.6 % — ABNORMAL HIGH (ref 11.5–15.5)
WBC: 14.3 K/uL — ABNORMAL HIGH (ref 4.0–10.5)
nRBC: 0 % (ref 0.0–0.2)

## 2024-06-13 LAB — MAGNESIUM: Magnesium: 3.3 mg/dL — ABNORMAL HIGH (ref 1.7–2.4)

## 2024-06-13 LAB — PHOSPHORUS: Phosphorus: 3.4 mg/dL (ref 2.5–4.6)

## 2024-06-13 MED ORDER — ENOXAPARIN SODIUM 60 MG/0.6ML IJ SOSY
60.0000 mg | PREFILLED_SYRINGE | INTRAMUSCULAR | Status: DC
  Administered 2024-06-14 – 2024-06-22 (×9): 60 mg via SUBCUTANEOUS
  Filled 2024-06-13 (×9): qty 0.6

## 2024-06-13 MED ORDER — OSMOLITE 1.5 CAL PO LIQD
1200.0000 mL | ORAL | Status: DC
Start: 2024-06-13 — End: 2024-06-18
  Administered 2024-06-13: 280 mL
  Administered 2024-06-14: 1000 mL
  Administered 2024-06-15: 1200 mL
  Administered 2024-06-15: 1000 mL
  Administered 2024-06-16: 1200 mL
  Filled 2024-06-13: qty 1422
  Filled 2024-06-13: qty 2000

## 2024-06-13 MED ORDER — INSULIN GLARGINE 100 UNIT/ML ~~LOC~~ SOLN
10.0000 [IU] | Freq: Every day | SUBCUTANEOUS | Status: DC
Start: 2024-06-13 — End: 2024-06-20
  Administered 2024-06-13 – 2024-06-20 (×7): 10 [IU] via SUBCUTANEOUS
  Filled 2024-06-13 (×10): qty 0.1

## 2024-06-13 MED ORDER — PROSOURCE TF20 ENFIT COMPATIBL EN LIQD
60.0000 mL | Freq: Three times a day (TID) | ENTERAL | Status: DC
  Administered 2024-06-13 – 2024-06-16 (×9): 60 mL
  Filled 2024-06-13 (×9): qty 60

## 2024-06-13 NOTE — Progress Notes (Signed)
Placed patient on bipap for the night with oxygen set at 2lpm  

## 2024-06-13 NOTE — Progress Notes (Signed)
 NAMEAxzel Rasmussen, MRN:  [id number], DOB:  Dec 05, 1955, LOS: 11 ADMISSION DATE:  06/02/2024, CONSULTATION DATE:  9/27 REFERRING MD:  Michaela, neuro CHIEF COMPLAINT: ICH   History of Present Illness:  68 year old male with past medical history of pulmonary hypertension, hyperlipidemia, OSA on CPAP at night, HFrEF, COPD, melanoma currently on nivolumab  presented to the ED this evening with right-sided weakness and aphasia. LKW around 9 AM.  Wife in the emergency department reported that around noon he seemed tired and not speaking much.  Also noted to be a little confused.  On arrival to ED, noted to have right-sided weakness, aphasia and code stroke was activated.  CT head with left frontal lobe 4 x 6 x 4 cm hemorrhage with concern for underlying mass, vasogenic edema and 9 mm midline shift.  CT angio with no LVO.  Labs with hemoglobin 12.3, sodium 140, creatinine 1.26, ethanol negative, glucose normal.  He was evaluated by neurology who admitted him to ICU.  CCM with cross coverage consult.  Pertinent  Medical History  pulmonary hypertension, hyperlipidemia, OSA on CPAP at night, HFrEF, COPD, melanoma currently on nivolumab   Past Medical History:  Diagnosis Date   2019 novel coronavirus disease (COVID-19) 07/2019   COPD (chronic obstructive pulmonary disease) (HCC)    HFrEF (heart failure with reduced ejection fraction) (HCC)    Melanoma (HCC)    Nonischemic cardiomyopathy (HCC)    OSA (obstructive sleep apnea)    Pulmonary hypertension (HCC)    WHO group II    Significant Hospital Events: Including procedures, antibiotic start and stop dates in addition to other pertinent events   9/27: Admit to ICU s/p code stroke with left frontal lobe ICH and concern for mass 9/30: Craniotomy with tumor resection and hematoma evacuation 10/1: Extubated 10/2: Unable to swallow plans for Cortrak tomorrow 10/3: Remain on 5 L nasal cannula oxygen, generalized weak, afebrile 10/5 transfer out of  ICU but later that night had hypotension and AMS prompting transfer back to ICU and starting BiPAP and NE 10/6 remains on low-dose Levophed, started on midodrine, came off of BiPAP 10/7 came off of vasopressor support, wore BiPAP overnight, currently on nasal cannula oxygen  Interim History / Subjective:  Patient remain off vasopressor support He was on BiPAP overnight, currently on nasal cannula oxygen Mental status has significantly improved  Remain afebrile  Objective   Blood pressure (!) 153/82, pulse (!) 57, temperature 98.3 F (36.8 C), temperature source Axillary, resp. rate 13, height 5' 7 (1.702 m), weight 129.7 kg, SpO2 99%.        Intake/Output Summary (Last 24 hours) at 06/13/2024 9077 Last data filed at 06/13/2024 9188 Gross per 24 hour  Intake 2359.29 ml  Output 1600 ml  Net 759.29 ml   Filed Weights   06/10/24 2233 06/11/24 0453 06/12/24 0500  Weight: 131.3 kg 129.4 kg 129.7 kg    Examination: General: Elderly morbidly obese male, lying on the bed HEENT: [place]/AT, eyes anicteric.  moist mucus membranes.  Left frontoparietal craniectomy incision looks clean and dry, staples in place. Cortrak in place Neuro: Awake, alert, tracking examiner, antigravity in all 4 extremities Chest: Rhonchorous breath sounds bilaterally Heart: Regular rate and rhythm, no murmurs or gallops Abdomen: Soft, nontender, nondistended, bowel sounds present  Labs and images reviewed  Resolved Hospital problems  Severe sepsis with septic shock, not POA  Assessment & Plan:  Acute left intraparenchymal hemorrhage in the setting of hemorrhagic tumor caused by metastatic melanoma, status post craniotomy and  evacuation of hematoma Metastatic melanoma on immunotherapy Surrounding cerebral edema, improving Induced hypernatremia, improving Acute metabolic encephalopathy in the setting of hypercapnia Morbid obesity OSA on CPAP at night COPD, not in exacerbation Acute respiratory failure with  hypoxia and hypercapnia Aspiration pneumonia Chronic right hemidiaphragm elevation Morbid obesity Acute kidney injury due to severe dehydration, improving Dysphagia Chronic right ventricular HFrEF in the setting of pulmonary hypertension Prediabetes Hyperlipidemia  Neurological exam is improving, he is awake, tracking examiner, generalized weak Neurosurgery is following Continue Decadron  Outpatient follow-up with oncology Serum sodium is slowly trending down, currently at 151 Continue free water flushes Remain off vasopressor support Continue IV antibiotics with IV Zosyn Patient needs to wear CPAP/BiPAP every night, during daytime he needs to be on nasal cannula oxygen to keep his O2 sat above 92% Continue aggressive PT/OT and swallow evaluation Serum creatinine continue to improve, avoid nephrotoxic agent Monitor intake and output Continue tube feeds Continue insulin with CBG goal 140-180    Valinda Novas, MD [facility] See Amion for pager If no response to pager, please call [phone] until 7pm After 7pm, Please call E-link [phone]

## 2024-06-13 NOTE — Progress Notes (Signed)
 Pt tx to 3W12, wife at bedside.

## 2024-06-13 NOTE — Progress Notes (Incomplete)
 NAMECesare Rasmussen, MRN:  [id number], DOB:  [birth year], LOS: 11 ADMISSION DATE:  06/02/2024, CONSULTATION DATE:  9/27 REFERRING MD:  Michaela, neuro CHIEF COMPLAINT: ICH   History of Present Illness:  68 year old male with past medical history of pulmonary hypertension, hyperlipidemia, OSA on CPAP at night, HFrEF, COPD, melanoma currently on nivolumab  presented to the ED this evening with right-sided weakness and aphasia. LKW around 9 AM.  Wife in the emergency department reported that around noon he seemed tired and not speaking much.  Also noted to be a little confused.  On arrival to ED, noted to have right-sided weakness, aphasia and code stroke was activated.  CT head with left frontal lobe 4 x 6 x 4 cm hemorrhage with concern for underlying mass, vasogenic edema and 9 mm midline shift.  CT angio with no LVO.  Labs with hemoglobin 12.3, sodium 140, creatinine 1.26, ethanol negative, glucose normal.  He was evaluated by neurology who admitted him to ICU.  CCM with cross coverage consult.  Pertinent  Medical History  pulmonary hypertension, hyperlipidemia, OSA on CPAP at night, HFrEF, COPD, melanoma currently on nivolumab   Past Medical History:  Diagnosis Date   2019 novel coronavirus disease (COVID-19) 07/2019   COPD (chronic obstructive pulmonary disease) (HCC)    HFrEF (heart failure with reduced ejection fraction) (HCC)    Melanoma (HCC)    Nonischemic cardiomyopathy (HCC)    OSA (obstructive sleep apnea)    Pulmonary hypertension (HCC)    WHO group II    Significant Hospital Events: Including procedures, antibiotic start and stop dates in addition to other pertinent events   9/27: Admit to ICU s/p code stroke with left frontal lobe ICH and concern for mass 9/30: Craniotomy with tumor resection and hematoma evacuation 10/1: Extubated 10/2: Unable to swallow plans for Cortrak tomorrow 10/3: Remain on 5 L nasal cannula oxygen, generalized weak, afebrile 10/5 transfer out of  ICU but later that night had hypotension and AMS prompting transfer back to ICU and starting BiPAP and NE 10/6 remains on low-dose Levophed, started on midodrine, came off of BiPAP  Interim History / Subjective:  *** Bipap?  Objective   Blood pressure (!) 152/74, pulse 63, temperature 98.3 F (36.8 C), temperature source Axillary, resp. rate 13, height 5' 7 (1.702 m), weight 129.7 kg, SpO2 98%.    FiO2 (%):  [40 %] 40 % PEEP:  [5 cmH20] 5 cmH20 Pressure Support:  [10 cmH20] 10 cmH20   Intake/Output Summary (Last 24 hours) at 06/13/2024 0820 Last data filed at 06/13/2024 9188 Gross per 24 hour  Intake 2335.48 ml  Output 1600 ml  Net 735.48 ml   Filed Weights   06/10/24 2233 06/11/24 0453 06/12/24 0500  Weight: 131.3 kg 129.4 kg 129.7 kg    Examination: *** General: Acute on chronically ill-appearing elderly morbidly obese male, lying on the bed HEENT: [place]/AT, eyes anicteric.  moist mucus membranes. Cortrak in place Neuro: Awake, generalized weak, following simple commands, absent cough and gag Chest: Reduced air entry at the bases right more than left, no wheezes or rhonchi Heart: Regular rate and rhythm, no murmurs or gallops Abdomen: Soft, nontender, nondistended, bowel sounds present  Na 151, K 4.6, Cr 1.37, BUN 76, WBC 14,  Afebrile VSS, net even.   Assessment & Plan:  Acute left intraparenchymal hemorrhage in the setting of hemorrhagic tumor caused by metastatic melanoma, status post craniotomy and evacuation of hematoma Metastatic melanoma on immunotherapy Surrounding cerebral edema, improving Induced hypernatremia, improving  Acute metabolic encephalopathy in the setting of hypercapnia Severe sepsis with septic shock with aspiration pneumonia, not POA Morbid obesity OSA on CPAP at night COPD, not in exacerbation Acute respiratory failure with hypoxia and hypercapnia Chronic right hemidiaphragm elevation Morbid obesity Acute kidney injury due to severe  dehydration Dysphagia Chronic right ventricular HFrEF in the setting of pulmonary hypertension Prediabetes Hyperlipidemia  Repeat head CT showed stable postop changes Continue Decadron  Outpatient follow-up with hematology Serum sodium is slowly trending down, currently at 158 Continue free water flushes Shock has resolved, patient is off vasopressors Continue IV antibiotics with IV Zosyn Patient needs to wear CPAP/BiPAP every night Continue PT/OT evaluation, out of bed to chair today Continue to titrate nasal cannula oxygen with O2 sat goal 92% Serum creatinine continue to improve, closely monitor, avoid nephrotoxic agent Continue tube feeds Hold diuretics Continue insulin with CBG goal 140-180      Deward Eastern, AGACNP-BC [facility]  See Amion for personal pager PCCM on call pager [phone] until 7pm. Please call Elink 7p-7a. [phone]  06/13/2024 8:25 AM

## 2024-06-13 NOTE — Progress Notes (Signed)
 Nutrition Follow-up  DOCUMENTATION CODES:   Morbid obesity  INTERVENTION:   Adjust tube feeding via Cortrak tube: Osmolite 1.5 at 80 ml/h x 15 hrs  (1200 ml per day)  Prosource TF20 60 ml TID  Provides 2040 kcal, 132 gm protein, 912 ml free water daily  300 ml free water every 4 hours - adjust per provider  Total free water: 2712 ml   Continue 100 mg thiamine daily x 7 days  MVI with minerals daily    NUTRITION DIAGNOSIS:   Inadequate oral intake related to inability to eat as evidenced by NPO status. - ongoing   GOAL:   Patient will meet greater than or equal to 90% of their needs - not met   MONITOR:   TF tolerance, Labs  REASON FOR ASSESSMENT:   Consult Enteral/tube feeding initiation and management  ASSESSMENT:   Pt with PMH of pulmonary HTN, HLD, OSA on CPAP at night, HF, COPD, and melanoma on immunotherapy (nivolumab ) admitted with R-sided weakness and aphasia, found to have L frontal ICH from hemorrhagic tumor s/p craniotomy.   Patient now on BIPAP at night, tube feeds were held overnight. Pharmacist reports patient wearing BIPAP ~ 9 hrs and feeds are being held overnight, asking if current regimen can be readjusted.  TF resumed this am at 35 ml/hr (goal rate 55 ml/hr), PS being given BID. T no complaining off any nausea or abd pain. Spouse at bedside reports pt's last known weight PTA was 317 lbs at last infusion appointment on 9/17. Current chart weight 129.4 kg (285 lbs), noted that weight decreased from 140.6 kg on 10/5 to 129.4 kg on 10/6, unsure of accuracy of current weight. Unable to take bedscale weight at visit due to bed not being in proper position. Off pressor support. SLP following for dysphagia management.   9/27 - admit with L frontal ICH 9/28 - per MRI pt with hemorrhagic tumor 9/30 - s/p craniotomy for tumor resection 10/1 - extubated 10/3 - s/p cortrak placement; tip gastric 10/5 transfer out of ICU but later that night had hypotension and  AMS prompting transfer back to ICU   Medications reviewed and include: decadron ,keppra, protonix,  MVI with minerals, thiamine 100 mg, SSI 0-20 units q 4 hrs, 10 units lantus daily     Labs reviewed:  Sodium 151 (FWF 300 q 4 hrs) Cl 118  Glucose 220  BUN 76 Cr 1.37 Calcium  8.2   UOP 1850 ml  NUTRITION - FOCUSED PHYSICAL EXAM:  Flowsheet Row Most Recent Value  Orbital Region No depletion  Upper Arm Region No depletion  Thoracic and Lumbar Region No depletion  Buccal Region Mild depletion  Temple Region No depletion  Clavicle Bone Region Moderate depletion  Clavicle and Acromion Bone Region Moderate depletion  Scapular Bone Region Unable to assess  Dorsal Hand Unable to assess  Patellar Region No depletion  Anterior Thigh Region No depletion  Posterior Calf Region No depletion  Edema (RD Assessment) None  Hair Reviewed  Eyes Reviewed  Mouth Unable to assess  Skin Reviewed  Nails Unable to assess    Diet Order:   Diet Order             Diet NPO time specified  Diet effective now                   EDUCATION NEEDS:   No education needs have been identified at this time  Skin:  Skin Assessment: Reviewed RN Assessment (head incision)  Last BM:  10/6 type 6  Height:   Ht Readings from Last 1 Encounters:  06/05/24 5' 7 (1.702 m)    Weight:   Wt Readings from Last 1 Encounters:  06/12/24 129.7 kg    BMI:  Body mass index is 44.78 kg/m.  Estimated Nutritional Needs:   Kcal:  1900-2100  Protein:  110-125 grams  Fluid:  >1.9 L/day  Madalyn Potters, MS, RD, LDN Clinical Dietitian  Please see AMiON for contact information.

## 2024-06-14 ENCOUNTER — Telehealth: Payer: Self-pay | Admitting: Cardiovascular Disease

## 2024-06-14 DIAGNOSIS — J690 Pneumonitis due to inhalation of food and vomit: Secondary | ICD-10-CM | POA: Diagnosis not present

## 2024-06-14 DIAGNOSIS — N179 Acute kidney failure, unspecified: Secondary | ICD-10-CM | POA: Diagnosis not present

## 2024-06-14 DIAGNOSIS — I629 Nontraumatic intracranial hemorrhage, unspecified: Secondary | ICD-10-CM | POA: Diagnosis not present

## 2024-06-14 DIAGNOSIS — C7931 Secondary malignant neoplasm of brain: Secondary | ICD-10-CM | POA: Diagnosis not present

## 2024-06-14 DIAGNOSIS — G936 Cerebral edema: Secondary | ICD-10-CM

## 2024-06-14 LAB — CBC
HCT: 44 % (ref 39.0–52.0)
Hemoglobin: 13.3 g/dL (ref 13.0–17.0)
MCH: 26.2 pg (ref 26.0–34.0)
MCHC: 30.2 g/dL (ref 30.0–36.0)
MCV: 86.6 fL (ref 80.0–100.0)
Platelets: 142 K/uL — ABNORMAL LOW (ref 150–400)
RBC: 5.08 MIL/uL (ref 4.22–5.81)
RDW: 15.2 % (ref 11.5–15.5)
WBC: 12.5 K/uL — ABNORMAL HIGH (ref 4.0–10.5)
nRBC: 0 % (ref 0.0–0.2)

## 2024-06-14 LAB — GLUCOSE, CAPILLARY
Glucose-Capillary: 128 mg/dL — ABNORMAL HIGH (ref 70–99)
Glucose-Capillary: 144 mg/dL — ABNORMAL HIGH (ref 70–99)
Glucose-Capillary: 160 mg/dL — ABNORMAL HIGH (ref 70–99)
Glucose-Capillary: 212 mg/dL — ABNORMAL HIGH (ref 70–99)
Glucose-Capillary: 250 mg/dL — ABNORMAL HIGH (ref 70–99)
Glucose-Capillary: 268 mg/dL — ABNORMAL HIGH (ref 70–99)

## 2024-06-14 LAB — BASIC METABOLIC PANEL WITH GFR
Anion gap: 8 (ref 5–15)
BUN: 66 mg/dL — ABNORMAL HIGH (ref 8–23)
CO2: 25 mmol/L (ref 22–32)
Calcium: 8.3 mg/dL — ABNORMAL LOW (ref 8.9–10.3)
Chloride: 120 mmol/L — ABNORMAL HIGH (ref 98–111)
Creatinine, Ser: 1.16 mg/dL (ref 0.61–1.24)
GFR, Estimated: >60 mL/min (ref >60)
Glucose, Bld: 135 mg/dL — ABNORMAL HIGH (ref 70–99)
Potassium: 4.5 mmol/L (ref 3.5–5.1)
Sodium: 153 mmol/L — ABNORMAL HIGH (ref 135–145)

## 2024-06-14 LAB — SODIUM: Sodium: 153 mmol/L — ABNORMAL HIGH (ref 135–145)

## 2024-06-14 MED ORDER — CARVEDILOL 6.25 MG PO TABS
6.2500 mg | ORAL_TABLET | Freq: Two times a day (BID) | ORAL | Status: DC
  Administered 2024-06-15 – 2024-06-21 (×13): 6.25 mg via ORAL
  Filled 2024-06-14 (×13): qty 1

## 2024-06-14 NOTE — Progress Notes (Signed)
 Occupational Therapy Treatment Patient Details Name: Justin Rasmussen MRN: [id number] DOB: 1956/07/30 Today's Date: 06/14/2024   History of present illness 68 y.o. male history of melanoma currently in the process of getting immunotherapy presented 06/02/24 with altered mental status, aphasia, and Rt sided weakness. Initial CT scan showing a left frontal hemorrhage. MRI showed underlying mass. S/p L frontal crani for tumor resection and evacuation of L frontal hematoma & transbronchial needle aspiration 9/30. Extubated 10/1. Questionable seizure activity noted during hospitalization. Becam hypotensive with respiratory decline required BiPAP and transfer to ICU; PMH-COPD, HF, cardiomyopathy, pulmonary HTN, OSA, melanoma on immunotherapy   OT comments  Pt is making limited progress towards their acute OT goals. Upon arrival, pt was more alert however he fatigued and become somnolent  throughout the session. Overall he continues to need total A +2-3 for bed mobility but tolerated sitting with min-mod A for balance for ~15 minutes during ADLs and lateral lean exercises. Pt needed max A and hand over hand to successfully bring cup and wash cloth to his mouth. He followed/initiated ~25% of simple 1 step commands with multimodal cues. OT to continue to follow acutely to facilitate progress towards established goals. Pt will continue to benefit from intensive inpatient follow up therapy, >3 hours/day after discharge.         If plan is discharge home, recommend the following:  Two people to help with walking and/or transfers;Two people to help with bathing/dressing/bathroom;Direct supervision/assist for medications management;Direct supervision/assist for financial management;Assist for transportation;Supervision due to cognitive status;Help with stairs or ramp for entrance   Equipment Recommendations  Other (comment)    Recommendations for Other Services Other (comment) (palliative)    Precautions /  Restrictions Precautions Precautions: Fall;Other (comment) Recall of Precautions/Restrictions: Impaired Precaution/Restrictions Comments: SBP < 160 Restrictions Weight Bearing Restrictions Per Provider Order: No       Mobility Bed Mobility Overal bed mobility: Needs Assistance Bed Mobility: Rolling, Sidelying to Sit, Sit to Sidelying Rolling: Total assist, +2 for physical assistance, +2 for safety/equipment Sidelying to sit: Total assist, +2 for physical assistance, +2 for safety/equipment   Sit to supine:  (+3) Sit to sidelying: Total assist, +2 for safety/equipment, +2 for physical assistance General bed mobility comments: roll to left; left side to sit with pt partially pushing up with LUE; legs dependently moved off/on the bed    Transfers Overall transfer level: Needs assistance                Lateral/Scoot Transfers: Total assist, +2 physical assistance General transfer comment: pt appeared to attempt to shift weight forward and pushing against mattress with LUE; attempted to assist with lateral scoot x2 with pt not assisting     Balance Overall balance assessment: Needs assistance Sitting-balance support: Single extremity supported, No upper extremity supported, Feet supported Sitting balance-Leahy Scale: Poor Sitting balance - Comments: rt leaning with initially mod assist to maintain balance; progressed to min assist; and then with fatigue progressed back to mod assist Postural control: Right lateral lean                                 ADL either performed or assessed with clinical judgement   ADL Overall ADL's : Needs assistance/impaired Eating/Feeding: NPO   Grooming: Maximal assistance Grooming Details (indicate cue type and reason): reuqires cues and hand over hand  General ADL Comments: total A +2-3    Extremity/Trunk Assessment Upper Extremity Assessment Upper Extremity Assessment: RUE  deficits/detail;LUE deficits/detail RUE Deficits / Details: pt moved elbow, wrist and hand to intrinsic stimuli, unable to elicit movement to command or purposeful/functional task LUE Deficits / Details: attemtping to hold cut and washcloth to bring to mouth, ultimately required hand over hand LUE Coordination: decreased fine motor   Lower Extremity Assessment Lower Extremity Assessment: Defer to PT evaluation        Vision   Vision Assessment?: Yes;Vision impaired- to be further tested in functional context Eye Alignment: Impaired (comment) Ocular Range of Motion: Impaired-to be further tested in functional context Alignment/Gaze Preference: Within Defined Limits Tracking/Visual Pursuits: Decreased smoothness of horizontal tracking;Requires cues, head turns, or add eye shifts to track;Impaired - to be further tested in functional context Diplopia Assessment: Other (comment)   Perception Perception Perception: Not tested   Praxis Praxis Praxis: Not tested   Communication Communication Communication: Impaired Factors Affecting Communication: Difficulty expressing self   Cognition Arousal: Alert Behavior During Therapy: Flat affect               OT - Cognition Comments: pt more alert today, followed ~25% of simple 1 step commands. poor sustained attention and initiation                 Following commands: Impaired Following commands impaired: Follows one step commands inconsistently, Follows one step commands with increased time      Cueing   Cueing Techniques: Verbal cues, Gestural cues, Tactile cues, Visual cues  Exercises Exercises: Other exercises    Shoulder Instructions       General Comments tolerateled upright sitting for ~15 minutes    Pertinent Vitals/ Pain       Pain Assessment Pain Assessment: Faces Faces Pain Scale: No hurt  Home Living                                          Prior Functioning/Environment               Frequency  Min 2X/week        Progress Toward Goals  OT Goals(current goals can now be found in the care plan section)     Acute Rehab OT Goals Patient Stated Goal: per wife to get better OT Goal Formulation: With family Time For Goal Achievement: 06/22/24 Potential to Achieve Goals: Good ADL Goals Pt Will Perform Grooming: with contact guard assist;sitting Pt Will Perform Upper Body Dressing: with min assist;with caregiver independent in assisting;sitting Pt Will Perform Lower Body Dressing: with mod assist;with caregiver independent in assisting;sit to/from stand Pt Will Transfer to Toilet: with mod assist;with +2 assist;stand pivot transfer;bedside commode Pt Will Perform Toileting - Clothing Manipulation and hygiene: with max assist;sit to/from stand Pt/caregiver will Perform Home Exercise Program: Right Upper extremity;With minimal assist Additional ADL Goal #1: Pt will maintain seated balance with GCA for participation in ADL for approx 3-5 min.  Plan      Co-evaluation      Reason for Co-Treatment: Complexity of the patient's impairments (multi-system involvement);For patient/therapist safety;Necessary to address cognition/behavior during functional activity;To address functional/ADL transfers PT goals addressed during session: Balance;Strengthening/ROM;Mobility/safety with mobility   SLP goals addressed during session: Swallowing;Cognition;Communication    AM-PAC OT 6 Clicks Daily Activity     Outcome Measure   Help from another person  eating meals?: Total Help from another person taking care of personal grooming?: A Lot Help from another person toileting, which includes using toliet, bedpan, or urinal?: Total Help from another person bathing (including washing, rinsing, drying)?: Total Help from another person to put on and taking off regular upper body clothing?: Total Help from another person to put on and taking off regular lower body clothing?:  Total 6 Click Score: 7    End of Session Equipment Utilized During Treatment: Oxygen  OT Visit Diagnosis: Unsteadiness on feet (R26.81);Other abnormalities of gait and mobility (R26.89);Muscle weakness (generalized) (M62.81);Low vision, both eyes (H54.2);Other symptoms and signs involving the nervous system (R29.898);Other symptoms and signs involving cognitive function;Cognitive communication deficit (R41.841);Hemiplegia and hemiparesis Symptoms and signs involving cognitive functions: Other Nontraumatic ICH Hemiplegia - Right/Left: Right Hemiplegia - dominant/non-dominant: Dominant Hemiplegia - caused by: Other Nontraumatic intracranial hemorrhage   Activity Tolerance Treatment limited secondary to medical complications (Comment)   Patient Left in bed;with call bell/phone within reach;with bed alarm set;with family/visitor present   Nurse Communication Mobility status;Need for lift equipment;Precautions        Time: 8864-8784 OT Time Calculation (min): 40 min  Charges: OT General Charges $OT Visit: 1 Visit OT Treatments $Self Care/Home Management : 8-22 mins $Therapeutic Activity: 8-22 mins  Lucie Kendall, OTR/L Acute Rehabilitation Services Office [phone] Secure Chat Communication Preferred   [name] 06/14/2024, 3:13 PM

## 2024-06-14 NOTE — Hospital Course (Signed)
 Justin Rasmussen is a 68 y.o. male with a history of pulmonary hypertension, hyperlipidemia, OSA on CPAP, HFrEF, COPD, melanoma.  Patient presented secondary to right-sided weakness and aphasia and found to have a left frontal lobe ICH secondary to a hemorrhagic metastatic mass with associated vasogenic edema. Patient required ICU admission. Neurosurgery consulted and performed a craniotomy with hematoma evacuation and tumor resection. Hospitalization complicated by dysphagia requiring an NG tube, respiratory failure requiring intubation in addition to septic shock secondary to aspiration pneumonia. Plan for discharge to rehab once medically stable.

## 2024-06-14 NOTE — TOC Progression Note (Signed)
 Transition of Care [facility]) - Progression Note    Patient Details  Name: Justin Rasmussen MRN: [id number] Date of Birth: [birth year]  [facility] CM/SW Contact  Andrez JULIANNA George, RN Phone Number: 06/14/2024, 10:51 AM  Clinical Narrative:     Pt with cortrak. CIR following but unsure he will be able to tolerate 3 hours of therapy a day.  IP Care management following.  Expected Discharge Plan: IP Rehab Facility Barriers to Discharge: Continued Medical Work up               Expected Discharge Plan and Services   Discharge Planning Services: CM Consult   Living arrangements for the past 2 months: Single Family Home                                       Social Drivers of Health (SDOH) Interventions SDOH Screenings   Food Insecurity: No Food Insecurity (06/04/2024)  Housing: Low Risk  (06/04/2024)  Transportation Needs: No Transportation Needs (06/04/2024)  Utilities: Not At Risk (06/04/2024)  Depression (PHQ2-9): Low Risk  (07/12/2023)  Physical Activity: Unknown (06/01/2019)  Social Connections: Moderately Isolated (06/13/2024)  Stress: No Stress Concern Present (06/01/2019)  Tobacco Use: Medium Risk (06/05/2024)    Readmission Risk Interventions     No data to display

## 2024-06-14 NOTE — Telephone Encounter (Signed)
 Patient just dropped off a handicapped accessible placard DMV form for Dr. Tyrone review.  Please reach out to the patient when it is ready for pick up.  Thank you.

## 2024-06-14 NOTE — Progress Notes (Signed)
 PROGRESS NOTE    Jobie Popp  FMW:[id number] DOB: 1956/02/12 DOA: 06/02/2024 PCP: Colette Torrence GRADE, MD   Brief Narrative: Justin Rasmussen is a 68 y.o. male with a history of pulmonary hypertension, hyperlipidemia, OSA on CPAP, HFrEF, COPD, melanoma.  Patient presented secondary to right-sided weakness and aphasia and found to have a left frontal lobe ICH secondary to a hemorrhagic metastatic mass with associated vasogenic edema. Patient required ICU admission. Neurosurgery consulted and performed a craniotomy with hematoma evacuation and tumor resection. Hospitalization complicated by dysphagia requiring an NG tube, respiratory failure requiring intubation in addition to septic shock secondary to aspiration pneumonia. Plan for discharge to rehab once medically stable.   Assessment and Plan:  Acute left frontal lobe intracranial hemorrhage Patient presented with symptoms including right-sided weakness and aphasia. Initial CT head (9/27) significant for hemorrhage in the anterior left frontal lobe measuring 4.3 x 6.2 x 4.2 cm and marked vasogenic edema with mass effect and 9 mm of midline shift MRI brain (9/28) significant for a hemorrhagic tumor in the anterior left frontal lobe, with a 2.5  cm enhancing tumor and increased hematoma. CTA head and neck (9/27) significant for no large vessel occlusion. LDL of 93. Hemoglobin A1C of 5.5%. Transthoracic Echocardiogram significant for an LVEF of 60-65% and no atrial level shunt. Neurology recommendations for BP goal of less than 160. PT/OT recommendations for inpatient rehab.  Vasogenic edema with 12 mm midline shift 2.5 cm enhancing tumor with surrounding hemorrhage Patient evaluated by neurosurgery. Patient started on decadron . Craniotomy with hematoma evacuation and tumor resection performed on 9/30. Per neurosurgery, plan for a long steroid taper. -Continue decadron   Metastatic melanoma Patient is under treatment with immunotherapy as an  outpatient.  COPD Stable. -Continue Duoneb as needed  AKI Creatinine of 1.26 on admission with peak  of 2.28. Creatinine improved with increased free water.  Septic shock Secondary to aspiration pneumonia. Patient required vasopressor support and was started empirically on Zosyn IV. Vasopressors weaned off. -Continue Zosyn IV  Hypernatremia Induced secondary to vasogenic edema. Current goal of 140-145. Trending down slowly and managed via free water per tube. -Continue free water per tube -Serial sodium/BMP  OSA on CPAP -Continue CPAP at bedtime  Pulmonary hypertension Noted.  Right lower lobe atelectasis Noted.  Dysphagia Related to ICH and acute illness. Patient seen and followed by speech therapy and dietitian. Cortrak placed on 10/3. -Speech therapy recommendations (10/9): Diet recommendations: NPO Medication Administration: Via alternative means -Dietitian recommendations (10/8) Adjust tube feeding via Cortrak tube: Osmolite 1.5 at 80 ml/h x 15 hrs  (1200 ml per day) Prosource TF20 60 ml TID Provides 2040 kcal, 132 gm protein, 912 ml free water daily 300 ml free water every 4 hours - adjust per provider  Total free water: 2712 ml  Continue 100 mg thiamine daily x 7 days MVI with minerals daily   Chronic HFrecEF Noted. Patient is on tamsulosin  Primary hypertension Patient is on Coreg  and Entresto  as an outpatient. Patient was managed on Cleviprex while in ICU secondary to ICH. Cleviprex weaned off. Patient no longer on antihypertensives. Blood pressure above goal of 160 mmHg, currently. -Will restart home Coreg   Hyperlipidemia Patient is on Crestor  as an outpatient, which was held on admission.  Diabetes mellitus type 2 Well controlled based on hemoglobin A1C of 5.5%. Patient is on Farxiga  as an outpatient, however this is more likely for his history of heart failure.  -Continue Lantus 10 units and SSI    DVT prophylaxis:  Lovenox  Code Status:   Code  Status: Full Code Family Communication: None at bedside. Wife on telephone. Disposition Plan: Discharge pending ongoing medical stabilization. Likely discharge to SNF when medically stable.   Consultants:  Neurology PCCM Neurosurgery  Procedures:  Neurosurgery procedure (9/30) Stereotactic left frontal craniotomy for resection of tumor Evacuation of left frontal hematoma Use of intraoperative microscope for microdissection Intubation/extubation EEG Bronchoscopy  Antimicrobials: Zosyn IV    Subjective: Patient is nonverbal.  Objective: BP (!) 165/80 (BP Location: Left Wrist)   Pulse 86   Temp 97.6 F (36.4 C) (Oral)   Resp 18   Ht 5' 7 (1.702 m)   Wt 129.7 kg   SpO2 94%   BMI 44.78 kg/m   Examination:  General exam: Appears calm and comfortable. Respiratory system: Clear to auscultation. Respiratory effort normal. Cardiovascular system: S1 & S2 heard, RRR. No murmur. Gastrointestinal system: Abdomen is nondistended, soft and nontender. Normal bowel sounds heard. Central nervous system: Alert. Non-verbal. Follows simple commands Musculoskeletal: No calf tenderness   Data Reviewed: I have personally reviewed following labs and imaging studies  CBC Lab Results  Component Value Date   WBC 12.5 (H) 06/14/2024   RBC 5.08 06/14/2024   HGB 13.3 06/14/2024   HCT 44.0 06/14/2024   MCV 86.6 06/14/2024   MCH 26.2 06/14/2024   PLT 142 (L) 06/14/2024   MCHC 30.2 06/14/2024   RDW 15.2 06/14/2024   LYMPHSABS 1.2 06/11/2024   MONOABS 1.4 (H) 06/11/2024   EOSABS 0.0 06/11/2024   BASOSABS 0.0 06/11/2024     Last metabolic panel Lab Results  Component Value Date   NA 153 (H) 06/14/2024   K 4.5 06/14/2024   CL 120 (H) 06/14/2024   CO2 25 06/14/2024   BUN 66 (H) 06/14/2024   CREATININE 1.16 06/14/2024   GLUCOSE 135 (H) 06/14/2024   GFRNONAA >60 06/14/2024   GFRAA 78 05/16/2020   CALCIUM  8.3 (L) 06/14/2024   PHOS 3.4 06/13/2024   PROT 7.1 06/02/2024    ALBUMIN 3.5 06/02/2024   LABGLOB 2.2 11/10/2021   AGRATIO 1.9 11/10/2021   BILITOT 0.7 06/02/2024   ALKPHOS 46 06/02/2024   AST 21 06/02/2024   ALT 13 06/02/2024   ANIONGAP 8 06/14/2024    GFR: Estimated Creatinine Clearance: 81.1 mL/min (by C-G formula based on SCr of 1.16 mg/dL).  No results found for this or any previous visit (from the past 240 hours).    Radiology Studies: No results found.    LOS: 12 days    Elgin Lam, MD [facility] 06/14/2024, 7:50 AM   If 7PM-7AM, please contact night-coverage [email or web address]

## 2024-06-14 NOTE — Progress Notes (Signed)
 Physical Therapy Treatment Patient Details Name: Justin Rasmussen MRN: [id number] DOB: 1955/11/22 Today's Date: 06/14/2024   History of Present Illness 68 y.o. male history of melanoma currently in the process of getting immunotherapy presented 06/02/24 with altered mental status, aphasia, and Rt sided weakness. Initial CT scan showing a left frontal hemorrhage. MRI showed underlying mass. S/p L frontal crani for tumor resection and evacuation of L frontal hematoma & transbronchial needle aspiration 9/30. Extubated 10/1. Questionable seizure activity noted during hospitalization. Becam hypotensive with respiratory decline required BiPAP and transfer to ICU; PMH-COPD, HF, cardiomyopathy, pulmonary HTN, OSA, melanoma on immunotherapy    PT Comments  Patient seen in co-treatment with OT and SLP. Pt more alert than previous sessions and able to slightly assist with left side to sit by pushing with LUE. Seated EOB worked on lateral lean/prop on Lt elbow and then pushing back up to midline (x1 no assist, x2 min assist); attempted rt lean and pt with no active use of RUE for support and total assist returned back to midline. Pt leaning forward and pushing with LUE as if to stand; attempted to have him scoot laterally to his left, however he was not able to coordinate/assist. ?try stedy vs Camie Plus for standing trials.     If plan is discharge home, recommend the following: Two people to help with walking and/or transfers;Two people to help with bathing/dressing/bathroom;Assistance with cooking/housework;Assistance with feeding;Direct supervision/assist for medications management;Direct supervision/assist for financial management;Assist for transportation;Help with stairs or ramp for entrance;Supervision due to cognitive status   Can travel by private vehicle        Equipment Recommendations  Hospital bed;Hoyer lift (pending progress)    Recommendations for Other Services       Precautions / Restrictions  Precautions Precautions: Fall;Other (comment) Recall of Precautions/Restrictions: Impaired Precaution/Restrictions Comments: SBP < 160 Restrictions Weight Bearing Restrictions Per Provider Order: No     Mobility  Bed Mobility Overal bed mobility: Needs Assistance Bed Mobility: Rolling, Sidelying to Sit, Sit to Sidelying Rolling: Total assist, +2 for physical assistance, +2 for safety/equipment Sidelying to sit: Total assist, +2 for physical assistance, +2 for safety/equipment   Sit to supine:  (+3) Sit to sidelying: Total assist, +2 for safety/equipment, +2 for physical assistance General bed mobility comments: roll to left; left side to sit with pt partially pushing up with LUE; legs dependently moved off/on the bed    Transfers Overall transfer level: Needs assistance                Lateral/Scoot Transfers: Total assist, +2 physical assistance General transfer comment: pt appeared to attempt to shift weight forward and pushing against mattress with LUE; attempted to assist with lateral scoot x2 with pt not assisting    Ambulation/Gait                   Stairs             Wheelchair Mobility     Tilt Bed    Modified Rankin (Stroke Patients Only) Modified Rankin (Stroke Patients Only) Pre-Morbid Rankin Score: No symptoms Modified Rankin: Severe disability     Balance Overall balance assessment: Needs assistance Sitting-balance support: Single extremity supported, No upper extremity supported, Feet supported Sitting balance-Leahy Scale: Poor Sitting balance - Comments: rt leaning with initially mod assist to maintain balance; progressed to min assist; and then with fatigue progressed back to mod assist Postural control: Right lateral lean  Communication Communication Communication: Impaired Factors Affecting Communication: Difficulty expressing self  Cognition Arousal: Alert Behavior During  Therapy: Flat affect   PT - Cognitive impairments: Difficult to assess Difficult to assess due to: Impaired communication                     PT - Cognition Comments: Pt nonverbal throughout session; mouthed hey x1 Following commands: Impaired Following commands impaired: Follows one step commands inconsistently, Follows one step commands with increased time    Cueing Cueing Techniques: Verbal cues, Gestural cues, Tactile cues, Visual cues  Exercises      General Comments General comments (skin integrity, edema, etc.): Pt sat EOB ~15-20 minutes while working with SLP, OT, and PT. Patient initially alert, however progressively became more somnolent as he fatigued.      Pertinent Vitals/Pain Pain Assessment Pain Assessment: Faces Faces Pain Scale: No hurt    Home Living                          Prior Function            PT Goals (current goals can now be found in the care plan section) Acute Rehab PT Goals Patient Stated Goal: unable to state; wife is hoping for pt to improve Time For Goal Achievement: 06/21/24 Potential to Achieve Goals: Good Progress towards PT goals: Progressing toward goals    Frequency    Min 2X/week      PT Plan      Co-evaluation PT/OT/SLP Co-Evaluation/Treatment: Yes Reason for Co-Treatment: Complexity of the patient's impairments (multi-system involvement);For patient/therapist safety;Necessary to address cognition/behavior during functional activity;To address functional/ADL transfers PT goals addressed during session: Balance;Strengthening/ROM;Mobility/safety with mobility   SLP goals addressed during session: Swallowing;Cognition;Communication    AM-PAC PT 6 Clicks Mobility   Outcome Measure  Help needed turning from your back to your side while in a flat bed without using bedrails?: Total Help needed moving from lying on your back to sitting on the side of a flat bed without using bedrails?: Total Help  needed moving to and from a bed to a chair (including a wheelchair)?: Total Help needed standing up from a chair using your arms (e.g., wheelchair or bedside chair)?: Total Help needed to walk in hospital room?: Total Help needed climbing 3-5 steps with a railing? : Total 6 Click Score: 6    End of Session   Activity Tolerance: Patient limited by fatigue Patient left: in bed;with call bell/phone within reach;with family/visitor present (L mitt) Nurse Communication: Mobility status PT Visit Diagnosis: Muscle weakness (generalized) (M62.81);Difficulty in walking, not elsewhere classified (R26.2);Other symptoms and signs involving the nervous system (R29.898);Hemiplegia and hemiparesis Hemiplegia - Right/Left: Right Hemiplegia - dominant/non-dominant: Dominant Hemiplegia - caused by: Other Nontraumatic intracranial hemorrhage (tumor)     Time: 8864-8784 PT Time Calculation (min) (ACUTE ONLY): 40 min  Charges:    $Therapeutic Activity: 8-22 mins PT General Charges $$ ACUTE PT VISIT: 1 Visit                      Macario RAMAN, PT Acute Rehabilitation Services  Office [phone]    Macario SHAUNNA Soja 06/14/2024, 2:06 PM

## 2024-06-14 NOTE — Progress Notes (Signed)
 Speech Language Pathology Treatment: Dysphagia;Cognitive-Linguistic  Patient Details Name: Justin Rasmussen MRN: [id number] DOB: 1955-10-25 Today's Date: 06/14/2024 Time: 8866-8785 SLP Time Calculation (min) (ACUTE ONLY): 41 min  Assessment / Plan / Recommendation Clinical Impression  Pt's level of alertness and attention were initially improved compared to previous session but he fatigued quickly. Seen with PT and OT to maximize postioning for targeting swallowing and cognitive-linguistic goals. Once sitting upright at EOB, he mouthed hey but otherwise did not initiate spontaneous communication. His accuracy with yes/no questions remains impaired as he only answers in the affirmative regardless of content. He followed simple commands more consistently but is limited by inattention. He has difficulty visually attending to items placed to the R of midline but did initiate holding the cup today and took sips of water. A swallow response was observed more promptly with ice chips and thin liquids, although he did not clear his oral cavity completely and moderate volumes were lost anteriorly after the swallow. Oral transit was not initiated with purees and was removed with suction. Discussed that while pt's performance was improved this session, he is not demonstrating the endurance to participate in an instrumental swallow test or take in more than a few sips of water. Will f/u.    HPI HPI: 68 y.o. male history of melanoma currently in the process of getting immunotherapy presented 06/02/24 with altered mental status, aphasia, and Rt sided weakness. Initial CT scan showing a left frontal hemorrhage. MRI showed underlying mass. S/p L frontal crani for tumor resection and evacuation of L frontal hematoma & transbronchial needle aspiration 9/30. Extubated 10/1. Questionable seizure activity noted during hospitalization. Became hypotensive and transferred to ICU on BiPAP 10/5. Repeat CTH stable. PMH-COPD, HF,  cardiomyopathy, pulmonary HTN, OSA, melanoma on immunotherapy      SLP Plan  Continue with current plan of care          Recommendations  Diet recommendations: NPO Medication Administration: Via alternative means                  Oral care QID   Frequent or constant Supervision/Assistance Aphasia (R47.01);Cognitive communication deficit (R41.841);Dysphagia, unspecified (R13.10)     Continue with current plan of care     Damien Blumenthal, M.A., CCC-SLP Speech Language Pathology, Acute Rehabilitation Services  Secure Chat preferred [phone]   06/14/2024, 12:36 PM

## 2024-06-14 NOTE — Progress Notes (Signed)
   Inpatient Rehabilitation Admissions Coordinator   Patient continues to not be at a level to pursue CIR admit.  Heron Leavell, RN, MSN Rehab Admissions Coordinator [phone] 06/14/2024 3:45 PM

## 2024-06-15 ENCOUNTER — Inpatient Hospital Stay (HOSPITAL_COMMUNITY)

## 2024-06-15 DIAGNOSIS — J690 Pneumonitis due to inhalation of food and vomit: Secondary | ICD-10-CM | POA: Diagnosis not present

## 2024-06-15 DIAGNOSIS — M898X6 Other specified disorders of bone, lower leg: Secondary | ICD-10-CM

## 2024-06-15 DIAGNOSIS — C7931 Secondary malignant neoplasm of brain: Secondary | ICD-10-CM | POA: Diagnosis not present

## 2024-06-15 DIAGNOSIS — I629 Nontraumatic intracranial hemorrhage, unspecified: Secondary | ICD-10-CM | POA: Diagnosis not present

## 2024-06-15 DIAGNOSIS — N179 Acute kidney failure, unspecified: Secondary | ICD-10-CM | POA: Diagnosis not present

## 2024-06-15 LAB — BASIC METABOLIC PANEL WITH GFR
Anion gap: 9 (ref 5–15)
BUN: 68 mg/dL — ABNORMAL HIGH (ref 8–23)
CO2: 25 mmol/L (ref 22–32)
Calcium: 8.1 mg/dL — ABNORMAL LOW (ref 8.9–10.3)
Chloride: 120 mmol/L — ABNORMAL HIGH (ref 98–111)
Creatinine, Ser: 1.16 mg/dL (ref 0.61–1.24)
GFR, Estimated: >60 mL/min (ref >60)
Glucose, Bld: 163 mg/dL — ABNORMAL HIGH (ref 70–99)
Potassium: 4.2 mmol/L (ref 3.5–5.1)
Sodium: 154 mmol/L — ABNORMAL HIGH (ref 135–145)

## 2024-06-15 LAB — GLUCOSE, CAPILLARY
Glucose-Capillary: 148 mg/dL — ABNORMAL HIGH (ref 70–99)
Glucose-Capillary: 175 mg/dL — ABNORMAL HIGH (ref 70–99)
Glucose-Capillary: 178 mg/dL — ABNORMAL HIGH (ref 70–99)
Glucose-Capillary: 208 mg/dL — ABNORMAL HIGH (ref 70–99)
Glucose-Capillary: 210 mg/dL — ABNORMAL HIGH (ref 70–99)
Glucose-Capillary: 247 mg/dL — ABNORMAL HIGH (ref 70–99)
Glucose-Capillary: 266 mg/dL — ABNORMAL HIGH (ref 70–99)

## 2024-06-15 LAB — CBC
HCT: 40.4 % (ref 39.0–52.0)
Hemoglobin: 12.3 g/dL — ABNORMAL LOW (ref 13.0–17.0)
MCH: 26.1 pg (ref 26.0–34.0)
MCHC: 30.4 g/dL (ref 30.0–36.0)
MCV: 85.6 fL (ref 80.0–100.0)
Platelets: 113 K/uL — ABNORMAL LOW (ref 150–400)
RBC: 4.72 MIL/uL (ref 4.22–5.81)
RDW: 15.2 % (ref 11.5–15.5)
WBC: 13.6 K/uL — ABNORMAL HIGH (ref 4.0–10.5)
nRBC: 0 % (ref 0.0–0.2)

## 2024-06-15 LAB — SODIUM: Sodium: 152 mmol/L — ABNORMAL HIGH (ref 135–145)

## 2024-06-15 MED ORDER — DEXTROSE-SODIUM CHLORIDE 5-0.45 % IV SOLN: INTRAVENOUS | Status: AC

## 2024-06-15 MED ORDER — SACUBITRIL-VALSARTAN 97-103 MG PO TABS
1.0000 | ORAL_TABLET | Freq: Two times a day (BID) | ORAL | Status: DC
Start: 2024-06-15 — End: 2024-06-18
  Administered 2024-06-15 – 2024-06-17 (×4): 1
  Filled 2024-06-15 (×7): qty 1

## 2024-06-15 NOTE — Progress Notes (Signed)
 Physical Therapy Treatment Patient Details Name: Justin Rasmussen MRN: [id number] DOB: [birth year] Today's Date: 06/15/2024   History of Present Illness 68 y.o. male history of melanoma currently in the process of getting immunotherapy presented 06/02/24 with altered mental status, aphasia, and Rt sided weakness. Initial CT scan showing a left frontal hemorrhage. MRI showed underlying mass. S/p L frontal crani for tumor resection and evacuation of L frontal hematoma & transbronchial needle aspiration 9/30. Extubated 10/1. Questionable seizure activity noted during hospitalization. Becam hypotensive with respiratory decline required BiPAP and transfer to ICU; PMH-COPD, HF, cardiomyopathy, pulmonary HTN, OSA, melanoma on immunotherapy    PT Comments  Pt received in supine and appears alert with nodding in response to questions, but no verbalizations. Session focused on bed level exercises and ROM due to lack of +2 assist. Pt follows commands inconsistently and is limited by weakness and fatigue. Noted BLE stiffness R>L. Family instructed in ROM and positioning techniques. Pt continues to benefit from PT services to progress toward functional mobility goals.    If plan is discharge home, recommend the following: Two people to help with walking and/or transfers;Two people to help with bathing/dressing/bathroom;Assistance with cooking/housework;Assistance with feeding;Direct supervision/assist for medications management;Direct supervision/assist for financial management;Assist for transportation;Help with stairs or ramp for entrance;Supervision due to cognitive status   Can travel by private vehicle        Equipment Recommendations  Hospital bed;Hoyer lift (pending progress)    Recommendations for Other Services       Precautions / Restrictions Precautions Precautions: Fall;Other (comment) Recall of Precautions/Restrictions: Impaired Precaution/Restrictions Comments: SBP < 160 Restrictions Weight  Bearing Restrictions Per Provider Order: No     Mobility  Bed Mobility Overal bed mobility: Needs Assistance             General bed mobility comments: Pt able to use rail on L to shift shoulders to midline. Deferred EOB due to lack of +2    Transfers                        Ambulation/Gait                   Stairs             Wheelchair Mobility     Tilt Bed    Modified Rankin (Stroke Patients Only) Modified Rankin (Stroke Patients Only) Pre-Morbid Rankin Score: No symptoms Modified Rankin: Severe disability     Balance       Sitting balance - Comments: nt                                    Communication Communication Communication: Impaired Factors Affecting Communication: Difficulty expressing self  Cognition Arousal: Alert Behavior During Therapy: Flat affect   PT - Cognitive impairments: Difficult to assess Difficult to assess due to: Impaired communication                     PT - Cognition Comments: Pt nonverbal and noding yes/no to questions Following commands: Impaired Following commands impaired: Follows one step commands inconsistently, Follows one step commands with increased time    Cueing Cueing Techniques: Verbal cues, Gestural cues, Tactile cues, Visual cues  Exercises General Exercises - Upper Extremity Shoulder Flexion: AAROM, PROM, Both, 5 reps General Exercises - Lower Extremity Ankle Circles/Pumps: PROM, Both, 10 reps Heel Slides: PROM, AAROM, Both, 5 reps  General Comments General comments (skin integrity, edema, etc.): Pt's family present and educated on positioning and ROM      Pertinent Vitals/Pain Pain Assessment Pain Assessment: Faces Faces Pain Scale: Hurts a little bit Pain Location: wincing with BLE ROM Pain Descriptors / Indicators: Grimacing Pain Intervention(s): Limited activity within patient's tolerance, Monitored during session, Repositioned     PT Goals  (current goals can now be found in the care plan section) Acute Rehab PT Goals Patient Stated Goal: unable to state; wife is hoping for pt to improve PT Goal Formulation: With patient/family Time For Goal Achievement: 06/21/24 Progress towards PT goals: Progressing toward goals    Frequency    Min 2X/week       AM-PAC PT 6 Clicks Mobility   Outcome Measure  Help needed turning from your back to your side while in a flat bed without using bedrails?: Total Help needed moving from lying on your back to sitting on the side of a flat bed without using bedrails?: Total Help needed moving to and from a bed to a chair (including a wheelchair)?: Total Help needed standing up from a chair using your arms (e.g., wheelchair or bedside chair)?: Total Help needed to walk in hospital room?: Total Help needed climbing 3-5 steps with a railing? : Total 6 Click Score: 6    End of Session Equipment Utilized During Treatment: Oxygen Activity Tolerance: Patient limited by fatigue Patient left: in bed;with call bell/phone within reach;with family/visitor present;with bed alarm set Nurse Communication: Mobility status PT Visit Diagnosis: Muscle weakness (generalized) (M62.81);Difficulty in walking, not elsewhere classified (R26.2);Other symptoms and signs involving the nervous system (R29.898);Hemiplegia and hemiparesis Hemiplegia - Right/Left: Right Hemiplegia - dominant/non-dominant: Dominant Hemiplegia - caused by: Other Nontraumatic intracranial hemorrhage     Time: 8679-8651 PT Time Calculation (min) (ACUTE ONLY): 28 min  Charges:    $Therapeutic Exercise: 23-37 mins PT General Charges $$ ACUTE PT VISIT: 1 Visit                     Justin Rasmussen, PTA Acute Rehabilitation Services Secure Chat Preferred  [phone]    Justin Rasmussen 06/15/2024, 2:02 PM

## 2024-06-15 NOTE — Progress Notes (Signed)
 PROGRESS NOTE    Justin Rasmussen  FMW:[id number] DOB: 1956/06/06 DOA: 06/02/2024 PCP: Colette Torrence GRADE, MD   Brief Narrative: Justin Rasmussen is a 68 y.o. male with a history of pulmonary hypertension, hyperlipidemia, OSA on CPAP, HFrEF, COPD, melanoma.  Patient presented secondary to right-sided weakness and aphasia and found to have a left frontal lobe ICH secondary to a hemorrhagic metastatic mass with associated vasogenic edema. Patient required ICU admission. Neurosurgery consulted and performed a craniotomy with hematoma evacuation and tumor resection. Hospitalization complicated by dysphagia requiring an NG tube, respiratory failure requiring intubation in addition to septic shock secondary to aspiration pneumonia. Plan for discharge to rehab once medically stable.   Assessment and Plan:  Acute left frontal lobe intracranial hemorrhage Patient presented with symptoms including right-sided weakness and aphasia. Initial CT head (9/27) significant for hemorrhage in the anterior left frontal lobe measuring 4.3 x 6.2 x 4.2 cm and marked vasogenic edema with mass effect and 9 mm of midline shift MRI brain (9/28) significant for a hemorrhagic tumor in the anterior left frontal lobe, with a 2.5  cm enhancing tumor and increased hematoma. CTA head and neck (9/27) significant for no large vessel occlusion. LDL of 93. Hemoglobin A1C of 5.5%. Transthoracic Echocardiogram significant for an LVEF of 60-65% and no atrial level shunt. Neurology recommendations for BP goal of less than 160. PT/OT recommendations for inpatient rehab.  Vasogenic edema with 12 mm midline shift 2.5 cm enhancing tumor with surrounding hemorrhage Patient evaluated by neurosurgery. Patient started on decadron . Craniotomy with hematoma evacuation and tumor resection performed on 9/30. Per neurosurgery, plan for a long steroid taper. -Continue decadron   Metastatic melanoma Patient is under treatment with immunotherapy as an  outpatient.  COPD Stable. -Continue Duoneb as needed  AKI Creatinine of 1.26 on admission with peak  of 2.28. Creatinine improved with increased free water. AKI resolved.  Septic shock Secondary to aspiration pneumonia. Patient required vasopressor support and was started empirically on Zosyn IV. Vasopressors weaned off. -Continue Zosyn IV  Hypernatremia Induced secondary to vasogenic edema. Current goal of 140-145. Trending down slowly and managed via free water per tube. -Continue free water per tube -Serial sodium/BMP -Start D5 1/2 NS at 40 ml/hr  OSA on CPAP -Continue CPAP at bedtime  Pulmonary hypertension Noted.  Right lower lobe atelectasis Noted.  Dysphagia Related to ICH and acute illness. Patient seen and followed by speech therapy and dietitian. Cortrak placed on 10/3. Discussed with patient/family the goal to advance diet; however if unable to advance, will need to consider PEG tube vs goals of care discussions if PEG tube is declined -Speech therapy recommendations (10/9): Diet recommendations: NPO Medication Administration: Via alternative means -Dietitian recommendations (10/8) Adjust tube feeding via Cortrak tube: Osmolite 1.5 at 80 ml/h x 15 hrs  (1200 ml per day) Prosource TF20 60 ml TID Provides 2040 kcal, 132 gm protein, 912 ml free water daily 300 ml free water every 4 hours - adjust per provider  Total free water: 2712 ml  Continue 100 mg thiamine daily x 7 days MVI with minerals daily   Chronic HFrecEF Noted. Patient is on tamsulosin  Primary hypertension Patient is on Coreg  and Entresto  as an outpatient. Patient was managed on Cleviprex while in ICU secondary to ICH. Cleviprex weaned off. Patient no longer on antihypertensives. Blood pressure above goal of 160 mmHg, currently. -Continue home Coreg  -Start home Entresto   Hyperlipidemia Patient is on Crestor  as an outpatient, which was held on admission.  Tibial mass  Unclear  etiology. -Check x-ray of right tib/fib  Diabetes mellitus type 2 Well controlled based on hemoglobin A1C of 5.5%. Patient is on Farxiga  as an outpatient, however this is more likely for his history of heart failure.  -Continue Lantus 10 units and SSI    DVT prophylaxis: Lovenox  Code Status:   Code Status: Full Code Family Communication: Wife, daughter and two grandsons at bedside. Disposition Plan: Discharge pending ability to advance oral diet vs need for PEG tube. Likely discharge to SNF when medically stable.   Consultants:  Neurology PCCM Neurosurgery  Procedures:  Neurosurgery procedure (9/30) Stereotactic left frontal craniotomy for resection of tumor Evacuation of left frontal hematoma Use of intraoperative microscope for microdissection Intubation/extubation EEG Bronchoscopy  Antimicrobials: Zosyn IV    Subjective: No specific concerns today.  Objective: BP (!) 168/81 (BP Location: Left Arm)   Pulse 81   Temp 99.1 F (37.3 C) (Oral)   Resp 16   Ht 5' 7 (1.702 m)   Wt 133.5 kg   SpO2 98%   BMI 46.10 kg/m   Examination:  General exam: Appears calm and comfortable. Respiratory system: Clear to auscultation. Respiratory effort normal. Cardiovascular system: S1 & S2 heard, RRR. No murmur. Gastrointestinal system: Abdomen is nondistended, soft and nontender. Normal bowel sounds heard. Central nervous system: Alert and is at least oriented to person. Musculoskeletal: BLE edema. No calf tenderness. Bony prominence noted over right tibia.  Data Reviewed: I have personally reviewed following labs and imaging studies  CBC Lab Results  Component Value Date   WBC 13.6 (H) 06/15/2024   RBC 4.72 06/15/2024   HGB 12.3 (L) 06/15/2024   HCT 40.4 06/15/2024   MCV 85.6 06/15/2024   MCH 26.1 06/15/2024   PLT 113 (L) 06/15/2024   MCHC 30.4 06/15/2024   RDW 15.2 06/15/2024   LYMPHSABS 1.2 06/11/2024   MONOABS 1.4 (H) 06/11/2024   EOSABS 0.0 06/11/2024    BASOSABS 0.0 06/11/2024     Last metabolic panel Lab Results  Component Value Date   NA 154 (H) 06/15/2024   K 4.2 06/15/2024   CL 120 (H) 06/15/2024   CO2 25 06/15/2024   BUN 68 (H) 06/15/2024   CREATININE 1.16 06/15/2024   GLUCOSE 163 (H) 06/15/2024   GFRNONAA >60 06/15/2024   GFRAA 78 05/16/2020   CALCIUM  8.1 (L) 06/15/2024   PHOS 3.4 06/13/2024   PROT 7.1 06/02/2024   ALBUMIN 3.5 06/02/2024   LABGLOB 2.2 11/10/2021   AGRATIO 1.9 11/10/2021   BILITOT 0.7 06/02/2024   ALKPHOS 46 06/02/2024   AST 21 06/02/2024   ALT 13 06/02/2024   ANIONGAP 9 06/15/2024    GFR: Estimated Creatinine Clearance: 82.5 mL/min (by C-G formula based on SCr of 1.16 mg/dL).  No results found for this or any previous visit (from the past 240 hours).    Radiology Studies: No results found.    LOS: 13 days    Elgin Lam, MD [facility] 06/15/2024, 1:46 PM   If 7PM-7AM, please contact night-coverage [email or web address]

## 2024-06-15 NOTE — Care Management Important Message (Signed)
 Important Message  Patient Details  Name: Justin Rasmussen MRN: [id number] Date of Birth: [birth year]   Important Message Given:  Yes - Medicare IM     Claretta Deed 06/15/2024, 3:24 PM

## 2024-06-15 NOTE — Plan of Care (Signed)
  Problem: Self-Care: Goal: Ability to communicate needs accurately will improve Outcome: Progressing   Problem: Nutrition: Goal: Risk of aspiration will decrease Outcome: Progressing   Problem: Coping: Goal: Level of anxiety will decrease Outcome: Progressing

## 2024-06-16 DIAGNOSIS — I629 Nontraumatic intracranial hemorrhage, unspecified: Secondary | ICD-10-CM | POA: Diagnosis not present

## 2024-06-16 DIAGNOSIS — C7931 Secondary malignant neoplasm of brain: Secondary | ICD-10-CM | POA: Diagnosis not present

## 2024-06-16 DIAGNOSIS — J690 Pneumonitis due to inhalation of food and vomit: Secondary | ICD-10-CM | POA: Diagnosis not present

## 2024-06-16 DIAGNOSIS — N179 Acute kidney failure, unspecified: Secondary | ICD-10-CM | POA: Diagnosis not present

## 2024-06-16 LAB — BASIC METABOLIC PANEL WITH GFR
Anion gap: 7 (ref 5–15)
BUN: 52 mg/dL — ABNORMAL HIGH (ref 8–23)
CO2: 27 mmol/L (ref 22–32)
Calcium: 7.9 mg/dL — ABNORMAL LOW (ref 8.9–10.3)
Chloride: 119 mmol/L — ABNORMAL HIGH (ref 98–111)
Creatinine, Ser: 0.96 mg/dL (ref 0.61–1.24)
GFR, Estimated: >60 mL/min (ref >60)
Glucose, Bld: 149 mg/dL — ABNORMAL HIGH (ref 70–99)
Potassium: 4.3 mmol/L (ref 3.5–5.1)
Sodium: 153 mmol/L — ABNORMAL HIGH (ref 135–145)

## 2024-06-16 LAB — GLUCOSE, CAPILLARY
Glucose-Capillary: 142 mg/dL — ABNORMAL HIGH (ref 70–99)
Glucose-Capillary: 216 mg/dL — ABNORMAL HIGH (ref 70–99)
Glucose-Capillary: 242 mg/dL — ABNORMAL HIGH (ref 70–99)
Glucose-Capillary: 264 mg/dL — ABNORMAL HIGH (ref 70–99)
Glucose-Capillary: 251 mg/dL — ABNORMAL HIGH (ref 70–99)

## 2024-06-16 LAB — SODIUM
Sodium: 152 mmol/L — ABNORMAL HIGH (ref 135–145)
Sodium: 151 mmol/L — ABNORMAL HIGH (ref 135–145)

## 2024-06-16 LAB — CBC
HCT: 37.9 % — ABNORMAL LOW (ref 39.0–52.0)
Hemoglobin: 11.4 g/dL — ABNORMAL LOW (ref 13.0–17.0)
MCH: 26 pg (ref 26.0–34.0)
MCHC: 30.1 g/dL (ref 30.0–36.0)
MCV: 86.5 fL (ref 80.0–100.0)
Platelets: 116 K/uL — ABNORMAL LOW (ref 150–400)
RBC: 4.38 MIL/uL (ref 4.22–5.81)
RDW: 15.5 % (ref 11.5–15.5)
WBC: 13.2 K/uL — ABNORMAL HIGH (ref 4.0–10.5)
nRBC: 0 % (ref 0.0–0.2)

## 2024-06-16 MED ORDER — LOPERAMIDE HCL 2 MG PO CAPS: 4.0000 mg | ORAL_CAPSULE | ORAL | Status: DC | PRN

## 2024-06-16 MED ORDER — GERHARDT'S BUTT CREAM
TOPICAL_CREAM | Freq: Two times a day (BID) | CUTANEOUS | Status: DC
  Filled 2024-06-16: qty 60

## 2024-06-16 NOTE — Progress Notes (Addendum)
 PROGRESS NOTE    Justin Rasmussen  FMW:[id number] DOB: 19-Jul-1956 DOA: 06/02/2024 PCP: Colette Torrence GRADE, MD   Brief Narrative: Justin Rasmussen is a 68 y.o. male with a history of pulmonary hypertension, hyperlipidemia, OSA on CPAP, HFrEF, COPD, melanoma.  Patient presented secondary to right-sided weakness and aphasia and found to have a left frontal lobe ICH secondary to a hemorrhagic metastatic mass with associated vasogenic edema. Patient required ICU admission. Neurosurgery consulted and performed a craniotomy with hematoma evacuation and tumor resection. Hospitalization complicated by dysphagia requiring an NG tube, respiratory failure requiring intubation in addition to septic shock secondary to aspiration pneumonia. Plan for discharge to rehab once medically stable.   Assessment and Plan:  Acute left frontal lobe intracranial hemorrhage Patient presented with symptoms including right-sided weakness and aphasia. Initial CT head (9/27) significant for hemorrhage in the anterior left frontal lobe measuring 4.3 x 6.2 x 4.2 cm and marked vasogenic edema with mass effect and 9 mm of midline shift MRI brain (9/28) significant for a hemorrhagic tumor in the anterior left frontal lobe, with a 2.5  cm enhancing tumor and increased hematoma. CTA head and neck (9/27) significant for no large vessel occlusion. LDL of 93. Hemoglobin A1C of 5.5%. Transthoracic Echocardiogram significant for an LVEF of 60-65% and no atrial level shunt. Neurology recommendations for BP goal of less than 160. PT/OT recommendations for inpatient rehab.  Vasogenic edema with 12 mm midline shift 2.5 cm enhancing tumor with surrounding hemorrhage Patient evaluated by neurosurgery. Patient started on decadron . Craniotomy with hematoma evacuation and tumor resection performed on 9/30. Per neurosurgery, plan for a long steroid taper. -Continue decadron  per neurosurgery recommendations  Metastatic melanoma Patient is under  treatment with immunotherapy as an outpatient.  COPD Stable. -Continue Duoneb as needed  AKI Creatinine of 1.26 on admission with peak  of 2.28. Creatinine improved with increased free water. AKI resolved.  Septic shock Secondary to aspiration pneumonia. Patient required vasopressor support and was started empirically on Zosyn IV. Vasopressors weaned off. -Continue Zosyn IV  Hypernatremia Induced secondary to vasogenic edema. Current goal of 140-145. Trending down slowly and managed via free water per tube. -Continue free water per tube -Serial sodium/BMP -Continue D5 1/2 NS at 40 ml/hr; may need to switch to D5 water  Fever Transient. No recurrence. -Trend fever curve CBC in AM  OSA on CPAP -Continue CPAP at bedtime  Pulmonary hypertension Noted.  Right lower lobe atelectasis Noted.  Dysphagia Related to ICH and acute illness. Patient seen and followed by speech therapy and dietitian. Cortrak placed on 10/3. Discussed with patient/family the goal to advance diet; however if unable to advance, will need to consider PEG tube vs goals of care discussions if PEG tube is declined -Speech therapy recommendations (10/9): Diet recommendations: NPO Medication Administration: Via alternative means -Dietitian recommendations (10/8) Adjust tube feeding via Cortrak tube: Osmolite 1.5 at 80 ml/h x 15 hrs  (1200 ml per day) Prosource TF20 60 ml TID Provides 2040 kcal, 132 gm protein, 912 ml free water daily 300 ml free water every 4 hours - adjust per provider  Total free water: 2712 ml  Continue 100 mg thiamine daily x 7 days MVI with minerals daily   Chronic HFrecEF Noted. LVEF of 60-65% with normal diastolic parameters.  Primary hypertension Patient is on Coreg  and Entresto  as an outpatient. Patient was managed on Cleviprex while in ICU secondary to ICH. Cleviprex weaned off. Patient no longer on antihypertensives. Blood pressure above goal of 160 mmHg,  currently. -Continue  home Coreg  -Start home Entresto   Hyperlipidemia Patient is on Crestor  as an outpatient, which was held on admission.  Tibial mass Unclear etiology. No abnormalities noted on x-ray.  Diabetes mellitus type 2 Well controlled based on hemoglobin A1C of 5.5%. Patient is on Farxiga  as an outpatient, however this is more likely for his history of heart failure. Blood sugar -Continue Lantus 10 units and SSI    DVT prophylaxis: Lovenox  Code Status:   Code Status: Full Code Family Communication: None at bedside. Wife on telephone Disposition Plan: Discharge pending ability to advance oral diet vs need for PEG tube. Likely discharge to SNF when medically stable.   Consultants:  Neurology PCCM Neurosurgery  Procedures:  Neurosurgery procedure (9/30) Stereotactic left frontal craniotomy for resection of tumor Evacuation of left frontal hematoma Use of intraoperative microscope for microdissection Intubation/extubation EEG Bronchoscopy  Antimicrobials: Zosyn IV    Subjective: Per nursing, patient has been pulling on lines. When I asked him, he shrugs his shoulder. Tmax of 100.4 F. No other issues noted.  Objective: BP (!) 115/59 (BP Location: Left Wrist)   Pulse 78   Temp 97.9 F (36.6 C) (Oral)   Resp 16   Ht 5' 7 (1.702 m)   Wt 128 kg   SpO2 94%   BMI 44.20 kg/m   Examination:  General exam: Appears calm and comfortable. Respiratory system: Clear to auscultation. Respiratory effort normal. Cardiovascular system: S1 & S2 heard, RRR. No murmur. Gastrointestinal system: Abdomen is nondistended, soft and nontender. Normal bowel sounds heard. Central nervous system: Alert and is at least oriented to person.   Data Reviewed: I have personally reviewed following labs and imaging studies  CBC Lab Results  Component Value Date   WBC 13.2 (H) 06/16/2024   RBC 4.38 06/16/2024   HGB 11.4 (L) 06/16/2024   HCT 37.9 (L) 06/16/2024   MCV 86.5 06/16/2024   MCH 26.0  06/16/2024   PLT 116 (L) 06/16/2024   MCHC 30.1 06/16/2024   RDW 15.5 06/16/2024   LYMPHSABS 1.2 06/11/2024   MONOABS 1.4 (H) 06/11/2024   EOSABS 0.0 06/11/2024   BASOSABS 0.0 06/11/2024     Last metabolic panel Lab Results  Component Value Date   NA 152 (H) 06/16/2024   K 4.3 06/16/2024   CL 119 (H) 06/16/2024   CO2 27 06/16/2024   BUN 52 (H) 06/16/2024   CREATININE 0.96 06/16/2024   GLUCOSE 149 (H) 06/16/2024   GFRNONAA >60 06/16/2024   GFRAA 78 05/16/2020   CALCIUM  7.9 (L) 06/16/2024   PHOS 3.4 06/13/2024   PROT 7.1 06/02/2024   ALBUMIN 3.5 06/02/2024   LABGLOB 2.2 11/10/2021   AGRATIO 1.9 11/10/2021   BILITOT 0.7 06/02/2024   ALKPHOS 46 06/02/2024   AST 21 06/02/2024   ALT 13 06/02/2024   ANIONGAP 7 06/16/2024    GFR: Estimated Creatinine Clearance: 97.3 mL/min (by C-G formula based on SCr of 0.96 mg/dL).  No results found for this or any previous visit (from the past 240 hours).    Radiology Studies: DG Tibia/Fibula Right Port Result Date: 06/15/2024 CLINICAL DATA:  Bony prominence of the right tibia EXAM: PORTABLE RIGHT TIBIA AND FIBULA - 2 VIEW COMPARISON:  None Available. FINDINGS: There is no evidence of fracture or other focal bone lesions. Soft tissues are unremarkable. IMPRESSION: No focal osseous abnormality. Electronically Signed   By: Limin  Xu M.D.   On: 06/15/2024 18:45      LOS: 14 days  Elgin Lam, MD [facility] 06/16/2024, 3:27 PM   If 7PM-7AM, please contact night-coverage [email or web address]

## 2024-06-17 DIAGNOSIS — N179 Acute kidney failure, unspecified: Secondary | ICD-10-CM | POA: Diagnosis not present

## 2024-06-17 DIAGNOSIS — I629 Nontraumatic intracranial hemorrhage, unspecified: Secondary | ICD-10-CM | POA: Diagnosis not present

## 2024-06-17 DIAGNOSIS — C7931 Secondary malignant neoplasm of brain: Secondary | ICD-10-CM | POA: Diagnosis not present

## 2024-06-17 DIAGNOSIS — J690 Pneumonitis due to inhalation of food and vomit: Secondary | ICD-10-CM | POA: Diagnosis not present

## 2024-06-17 LAB — BASIC METABOLIC PANEL WITH GFR
Anion gap: 13 (ref 5–15)
BUN: 44 mg/dL — ABNORMAL HIGH (ref 8–23)
CO2: 24 mmol/L (ref 22–32)
Calcium: 8.1 mg/dL — ABNORMAL LOW (ref 8.9–10.3)
Chloride: 116 mmol/L — ABNORMAL HIGH (ref 98–111)
Creatinine, Ser: 0.91 mg/dL (ref 0.61–1.24)
GFR, Estimated: >60 mL/min (ref >60)
Glucose, Bld: 152 mg/dL — ABNORMAL HIGH (ref 70–99)
Potassium: 4.2 mmol/L (ref 3.5–5.1)
Sodium: 153 mmol/L — ABNORMAL HIGH (ref 135–145)

## 2024-06-17 LAB — GLUCOSE, CAPILLARY
Glucose-Capillary: 134 mg/dL — ABNORMAL HIGH (ref 70–99)
Glucose-Capillary: 134 mg/dL — ABNORMAL HIGH (ref 70–99)
Glucose-Capillary: 138 mg/dL — ABNORMAL HIGH (ref 70–99)
Glucose-Capillary: 140 mg/dL — ABNORMAL HIGH (ref 70–99)
Glucose-Capillary: 181 mg/dL — ABNORMAL HIGH (ref 70–99)
Glucose-Capillary: 198 mg/dL — ABNORMAL HIGH (ref 70–99)

## 2024-06-17 LAB — SODIUM
Sodium: 152 mmol/L — ABNORMAL HIGH (ref 135–145)
Sodium: 149 mmol/L — ABNORMAL HIGH (ref 135–145)

## 2024-06-17 LAB — CBC
HCT: 41.1 % (ref 39.0–52.0)
Hemoglobin: 13 g/dL (ref 13.0–17.0)
MCH: 26.5 pg (ref 26.0–34.0)
MCHC: 31.6 g/dL (ref 30.0–36.0)
MCV: 83.9 fL (ref 80.0–100.0)
Platelets: 117 K/uL — ABNORMAL LOW (ref 150–400)
RBC: 4.9 MIL/uL (ref 4.22–5.81)
RDW: 15.4 % (ref 11.5–15.5)
WBC: 14.4 K/uL — ABNORMAL HIGH (ref 4.0–10.5)
nRBC: 0 % (ref 0.0–0.2)

## 2024-06-17 MED ORDER — DEXTROSE 5 % IV SOLN: INTRAVENOUS | Status: AC

## 2024-06-17 NOTE — Progress Notes (Signed)
 Placed patient on bipap for the night

## 2024-06-17 NOTE — Progress Notes (Addendum)
 PROGRESS NOTE    Justin Rasmussen  FMW:[id number] DOB: 1955/11/05 DOA: 06/02/2024 PCP: Colette Torrence GRADE, MD   Brief Narrative: Justin Rasmussen is a 68 y.o. male with a history of pulmonary hypertension, hyperlipidemia, OSA on CPAP, HFrEF, COPD, melanoma.  Patient presented secondary to right-sided weakness and aphasia and found to have a left frontal lobe ICH secondary to a hemorrhagic metastatic mass with associated vasogenic edema. Patient required ICU admission. Neurosurgery consulted and performed a craniotomy with hematoma evacuation and tumor resection. Hospitalization complicated by dysphagia requiring an NG tube, respiratory failure requiring intubation in addition to septic shock secondary to aspiration pneumonia. Plan for discharge to rehab once medically stable.   Assessment and Plan:  Acute left frontal lobe intracranial hemorrhage Patient presented with symptoms including right-sided weakness and aphasia. Initial CT head (9/27) significant for hemorrhage in the anterior left frontal lobe measuring 4.3 x 6.2 x 4.2 cm and marked vasogenic edema with mass effect and 9 mm of midline shift MRI brain (9/28) significant for a hemorrhagic tumor in the anterior left frontal lobe, with a 2.5  cm enhancing tumor and increased hematoma. CTA head and neck (9/27) significant for no large vessel occlusion. LDL of 93. Hemoglobin A1C of 5.5%. Transthoracic Echocardiogram significant for an LVEF of 60-65% and no atrial level shunt. Neurology recommendations for BP goal of less than 160. PT/OT recommendations for inpatient rehab.  Vasogenic edema with 12 mm midline shift 2.5 cm enhancing tumor with surrounding hemorrhage Patient evaluated by neurosurgery. Patient started on decadron . Craniotomy with hematoma evacuation and tumor resection performed on 9/30. Per neurosurgery, plan for a long steroid taper. -Continue decadron  per neurosurgery recommendations  Metastatic melanoma Patient is under  treatment with immunotherapy as an outpatient.  COPD Stable. -Continue Duoneb as needed  AKI Creatinine of 1.26 on admission with peak  of 2.28. Creatinine improved with increased free water. AKI resolved.  Septic shock Secondary to aspiration pneumonia. Patient required vasopressor support and was started empirically on Zosyn IV. Vasopressors weaned off. -Continue Zosyn IV  Hypernatremia Induced secondary to vasogenic edema. Current goal of 140-145. Trending down slowly and managed via free water per tube. NG tube now out. Sodium up to 153 -Continue free water per tube -Serial sodium/BMP -Transition to D5 water at 40 mL/hr; increase rate if sodium not improving  Fever Transient. No recurrence. Stable leukocytosis. -Trend fever curve  OSA on CPAP -Continue CPAP at bedtime  Pulmonary hypertension Noted.  Thrombocytopenia Likely reactive related to illness. Appears to have reached nadir. -Trend CBC  Right lower lobe atelectasis Noted.  Dysphagia Related to ICH and acute illness. Patient seen and followed by speech therapy and dietitian. Cortrak placed on 10/3. Discussed with patient/family the goal to advance diet; however if unable to advance, will need to consider PEG tube vs goals of care discussions if PEG tube is declined -Speech therapy recommendations (10/9): Diet recommendations: NPO Medication Administration: Via alternative means -Dietitian recommendations (10/8) Adjust tube feeding via Cortrak tube: Osmolite 1.5 at 80 ml/h x 15 hrs  (1200 ml per day) Prosource TF20 60 ml TID Provides 2040 kcal, 132 gm protein, 912 ml free water daily 300 ml free water every 4 hours - adjust per provider  Total free water: 2712 ml  Continue 100 mg thiamine daily x 7 days MVI with minerals daily  -Follow-up speech therapy reevaluation; if unable to advance diet on the next day, will need NG tube reinserted  Chronic HFrecEF Noted. LVEF of 60-65% with normal diastolic  parameters.  Primary hypertension Patient is on Coreg  and Entresto  as an outpatient. Patient was managed on Cleviprex while in ICU secondary to ICH. Cleviprex weaned off. Patient no longer on antihypertensives. Blood pressure above goal of 160 mmHg, currently. -Continue home Coreg  and Entresto  (on hold since NG tube is removed -Continue labetalol  PRN  Hyperlipidemia Patient is on Crestor  as an outpatient, which was held on admission.  Tibial mass Unclear etiology. No abnormalities noted on x-ray.  Diabetes mellitus type 2 Well controlled based on hemoglobin A1C of 5.5%. Patient is on Farxiga  as an outpatient, however this is more likely for his history of heart failure. Blood sugar -Continue Lantus 10 units and SSI   DVT prophylaxis: Lovenox  Code Status:   Code Status: Full Code Family Communication: Wife at bedside. Disposition Plan: Discharge pending ability to advance oral diet vs need for PEG tube. Likely discharge to SNF when medically stable.   Consultants:  Neurology PCCM Neurosurgery  Procedures:  Neurosurgery procedure (9/30) Stereotactic left frontal craniotomy for resection of tumor Evacuation of left frontal hematoma Use of intraoperative microscope for microdissection Intubation/extubation EEG Bronchoscopy  Antimicrobials: Zosyn IV    Subjective: Patient removed his NG tube overnight. No other issues noted. No recurrent fevers.  Objective: BP (!) 147/77 (BP Location: Left Arm)   Pulse 80   Temp 97.7 F (36.5 C) (Oral)   Resp 18   Ht 5' 7 (1.702 m)   Wt 125.2 kg   SpO2 95%   BMI 43.23 kg/m   Examination:  General exam: Appears calm and comfortable. Respiratory system: Clear to auscultation. Respiratory effort normal. Cardiovascular system: S1 & S2 heard, RRR. No murmur. Gastrointestinal system: Abdomen is nondistended, soft and nontender. Normal bowel sounds heard. Central nervous system: Alert.   Data Reviewed: I have personally reviewed  following labs and imaging studies  CBC Lab Results  Component Value Date   WBC 14.4 (H) 06/17/2024   RBC 4.90 06/17/2024   HGB 13.0 06/17/2024   HCT 41.1 06/17/2024   MCV 83.9 06/17/2024   MCH 26.5 06/17/2024   PLT 117 (L) 06/17/2024   MCHC 31.6 06/17/2024   RDW 15.4 06/17/2024   LYMPHSABS 1.2 06/11/2024   MONOABS 1.4 (H) 06/11/2024   EOSABS 0.0 06/11/2024   BASOSABS 0.0 06/11/2024     Last metabolic panel Lab Results  Component Value Date   NA 153 (H) 06/17/2024   K 4.2 06/17/2024   CL 116 (H) 06/17/2024   CO2 24 06/17/2024   BUN 44 (H) 06/17/2024   CREATININE 0.91 06/17/2024   GLUCOSE 152 (H) 06/17/2024   GFRNONAA >60 06/17/2024   GFRAA 78 05/16/2020   CALCIUM  8.1 (L) 06/17/2024   PHOS 3.4 06/13/2024   PROT 7.1 06/02/2024   ALBUMIN 3.5 06/02/2024   LABGLOB 2.2 11/10/2021   AGRATIO 1.9 11/10/2021   BILITOT 0.7 06/02/2024   ALKPHOS 46 06/02/2024   AST 21 06/02/2024   ALT 13 06/02/2024   ANIONGAP 13 06/17/2024    GFR: Estimated Creatinine Clearance: 101.3 mL/min (by C-G formula based on SCr of 0.91 mg/dL).  No results found for this or any previous visit (from the past 240 hours).    Radiology Studies: DG Tibia/Fibula Right Port Result Date: 06/15/2024 CLINICAL DATA:  Bony prominence of the right tibia EXAM: PORTABLE RIGHT TIBIA AND FIBULA - 2 VIEW COMPARISON:  None Available. FINDINGS: There is no evidence of fracture or other focal bone lesions. Soft tissues are unremarkable. IMPRESSION: No focal osseous abnormality. Electronically Signed  By: Limin  Xu M.D.   On: 06/15/2024 18:45      LOS: 15 days    Justin Lam, MD [facility] 06/17/2024, 12:09 PM   If 7PM-7AM, please contact night-coverage [email or web address]

## 2024-06-17 NOTE — Progress Notes (Signed)
 Speech Language Pathology Treatment: Dysphagia  Patient Details Name: Justin Rasmussen MRN: [id number] DOB: [birth year] Today's Date: 06/17/2024 Time: 8859-8844 SLP Time Calculation (min) (ACUTE ONLY): 15 min  Assessment / Plan / Recommendation Clinical Impression  Pt was seen for dysphagia treatment after he removed his Cortrak. Pt's RN reported that the pt's level of alertness is notably improved compared to last week and that he has been communicating, but with a low vocal intensity. Pt was edentulous and pt's wife reported that she will bring the pt's dentures tomorrow. Pt tolerated puree solids, dual consistency boluses (i.e., pears with juice) regular textures, and consecutive swallows of thin liquids via straw without overt s/s of aspiration. Oral manipulation was slightly prolonged, but oral clearance was [facility]. It is recommended that a dysphagia 3 diet with thin liquids be initiated at this time. SLP will continue to follow pt. SABRA    HPI HPI: 68 y.o. male history of melanoma currently in the process of getting immunotherapy presented 06/02/24 with altered mental status, aphasia, and Rt sided weakness. Initial CT scan showing a left frontal hemorrhage. MRI showed underlying mass. S/p L frontal crani for tumor resection and evacuation of L frontal hematoma & transbronchial needle aspiration 9/30. Extubated 10/1. Questionable seizure activity noted during hospitalization. Became hypotensive and transferred to ICU on BiPAP 10/5. Repeat CTH stable. PMH-COPD, HF, cardiomyopathy, pulmonary HTN, OSA, melanoma on immunotherapy      SLP Plan  Continue with current plan of care          Recommendations  Diet recommendations: Dysphagia 3 (mechanical soft);Thin liquid Liquids provided via: Cup;Straw Medication Administration: Whole meds with puree Supervision: Staff to assist with self feeding;Full supervision/cueing for compensatory strategies Compensations: Minimize environmental distractions;Small  sips/bites;Slow rate Postural Changes and/or Swallow Maneuvers: Seated upright 90 degrees                  Oral care QID   Frequent or constant Supervision/Assistance Dysphagia, unspecified (R13.10)     Continue with current plan of care   Alyannah Sanks I. [place], MS, CCC-SLP Acute Rehabilitation Services Office number [phone]  Thea LILLETTE [place]  06/17/2024, 12:51 PM

## 2024-06-18 DIAGNOSIS — I629 Nontraumatic intracranial hemorrhage, unspecified: Secondary | ICD-10-CM | POA: Diagnosis not present

## 2024-06-18 LAB — BASIC METABOLIC PANEL WITH GFR
Anion gap: 9 (ref 5–15)
BUN: 40 mg/dL — ABNORMAL HIGH (ref 8–23)
CO2: 23 mmol/L (ref 22–32)
Calcium: 7.9 mg/dL — ABNORMAL LOW (ref 8.9–10.3)
Chloride: 113 mmol/L — ABNORMAL HIGH (ref 98–111)
Creatinine, Ser: 0.9 mg/dL (ref 0.61–1.24)
GFR, Estimated: >60 mL/min (ref >60)
Glucose, Bld: 166 mg/dL — ABNORMAL HIGH (ref 70–99)
Potassium: 4.4 mmol/L (ref 3.5–5.1)
Sodium: 145 mmol/L (ref 135–145)

## 2024-06-18 LAB — GLUCOSE, CAPILLARY
Glucose-Capillary: 142 mg/dL — ABNORMAL HIGH (ref 70–99)
Glucose-Capillary: 152 mg/dL — ABNORMAL HIGH (ref 70–99)
Glucose-Capillary: 165 mg/dL — ABNORMAL HIGH (ref 70–99)
Glucose-Capillary: 167 mg/dL — ABNORMAL HIGH (ref 70–99)
Glucose-Capillary: 176 mg/dL — ABNORMAL HIGH (ref 70–99)
Glucose-Capillary: 183 mg/dL — ABNORMAL HIGH (ref 70–99)
Glucose-Capillary: 250 mg/dL — ABNORMAL HIGH (ref 70–99)

## 2024-06-18 LAB — CBC
HCT: 38.7 % — ABNORMAL LOW (ref 39.0–52.0)
Hemoglobin: 12.3 g/dL — ABNORMAL LOW (ref 13.0–17.0)
MCH: 26.4 pg (ref 26.0–34.0)
MCHC: 31.8 g/dL (ref 30.0–36.0)
MCV: 83 fL (ref 80.0–100.0)
Platelets: 117 K/uL — ABNORMAL LOW (ref 150–400)
RBC: 4.66 MIL/uL (ref 4.22–5.81)
RDW: 15.6 % — ABNORMAL HIGH (ref 11.5–15.5)
WBC: 11.9 K/uL — ABNORMAL HIGH (ref 4.0–10.5)
nRBC: 0 % (ref 0.0–0.2)

## 2024-06-18 MED ORDER — ENSURE PLUS HIGH PROTEIN PO LIQD
237.0000 mL | Freq: Two times a day (BID) | ORAL | Status: DC
  Administered 2024-06-18 – 2024-06-22 (×8): 237 mL via ORAL

## 2024-06-18 MED ORDER — ADULT MULTIVITAMIN W/MINERALS CH
1.0000 | ORAL_TABLET | Freq: Every day | ORAL | Status: DC
  Administered 2024-06-18 – 2024-06-22 (×5): 1 via ORAL
  Filled 2024-06-18 (×4): qty 1

## 2024-06-18 MED ORDER — PROMETHAZINE HCL 12.5 MG PO TABS: 12.5000 mg | ORAL_TABLET | ORAL | Status: DC | PRN

## 2024-06-18 MED ORDER — SACUBITRIL-VALSARTAN 97-103 MG PO TABS
1.0000 | ORAL_TABLET | Freq: Two times a day (BID) | ORAL | Status: DC
  Administered 2024-06-18 – 2024-06-22 (×9): 1 via ORAL
  Filled 2024-06-18 (×9): qty 1

## 2024-06-18 MED ORDER — POLYETHYLENE GLYCOL 3350 17 G PO PACK: 17.0000 g | PACK | Freq: Every day | ORAL | Status: DC | PRN

## 2024-06-18 MED ORDER — DOCUSATE SODIUM 100 MG PO CAPS: 100.0000 mg | ORAL_CAPSULE | Freq: Two times a day (BID) | ORAL | Status: DC | PRN

## 2024-06-18 NOTE — Progress Notes (Signed)
 Physical Therapy Treatment Patient Details Name: Justin Rasmussen MRN: [id number] DOB: 1956/03/14 Today's Date: 06/18/2024   History of Present Illness 68 y.o. male history of melanoma currently in the process of getting immunotherapy presented 06/02/24 with altered mental status, aphasia, and Rt sided weakness. Initial CT scan showing a left frontal hemorrhage. MRI showed underlying mass. S/p L frontal crani for tumor resection and evacuation of L frontal hematoma & transbronchial needle aspiration 9/30. Extubated 10/1. Questionable seizure activity noted during hospitalization. Becam hypotensive with respiratory decline required BiPAP and transfer to ICU; PMH-COPD, HF, cardiomyopathy, pulmonary HTN, OSA, melanoma on immunotherapy    PT Comments  Pt received in supine and agreeable to session. Pt demonstrates improved alertness and participation this session with improved verbalization attempts. Pt requires max A +2 for bed mobility and progresses from mod A to CGA sitting EOB. While sitting EOB pt attempts to reposition resulting in slight posterior lean and pt sliding forward requiring assist to lower to the ground. Pt alert, unharmed, and calm. Maximove utilized to safely lift pt back to bed. RN present to assist and confirmed pt is unhurt once supine. Pt continues to benefit from PT services to progress toward functional mobility goals.    If plan is discharge home, recommend the following: Two people to help with walking and/or transfers;Two people to help with bathing/dressing/bathroom;Assistance with cooking/housework;Assistance with feeding;Direct supervision/assist for medications management;Direct supervision/assist for financial management;Assist for transportation;Help with stairs or ramp for entrance;Supervision due to cognitive status   Can travel by private vehicle        Equipment Recommendations  Hospital bed;Hoyer lift (pending progress)    Recommendations for Other Services        Precautions / Restrictions Precautions Precautions: Fall;Other (comment) Recall of Precautions/Restrictions: Intact Precaution/Restrictions Comments: SBP < 160 Restrictions Weight Bearing Restrictions Per Provider Order: No     Mobility  Bed Mobility Overal bed mobility: Needs Assistance Bed Mobility: Rolling, Sidelying to Sit, Sit to Sidelying Rolling: Max assist, +2 for safety/equipment, +2 for physical assistance Sidelying to sit: Max assist, +2 for physical assistance, +2 for safety/equipment       General bed mobility comments: Pt with improved initiation this session. better command following, continues to benefit from multimodal cues and increased time. Max A +2 due to weakness    Transfers                   General transfer comment: via maximove this session    Ambulation/Gait                   Stairs             Wheelchair Mobility     Tilt Bed    Modified Rankin (Stroke Patients Only) Modified Rankin (Stroke Patients Only) Pre-Morbid Rankin Score: No symptoms Modified Rankin: Severe disability     Balance Overall balance assessment: Needs assistance Sitting-balance support: Single extremity supported, Feet supported Sitting balance-Leahy Scale: Poor Sitting balance - Comments: sitting EOB on air mattress (size wise) with mod A progressing to min guard pt required cues for postural control                                    Communication Communication Communication: Impaired Factors Affecting Communication: Difficulty expressing self  Cognition Arousal: Alert Behavior During Therapy: WFL for tasks assessed/performed   PT - Cognitive impairments: Awareness, Sequencing, Problem solving  PT - Cognition Comments: Improved responsiveness with some verbalizations Following commands: Impaired Following commands impaired: Follows one step commands inconsistently, Follows one step  commands with increased time    Cueing Cueing Techniques: Verbal cues, Gestural cues, Tactile cues, Visual cues  Exercises      General Comments        Pertinent Vitals/Pain Pain Assessment Pain Assessment: No/denies pain     PT Goals (current goals can now be found in the care plan section) Acute Rehab PT Goals Patient Stated Goal: unable to state; wife is hoping for pt to improve PT Goal Formulation: With patient/family Time For Goal Achievement: 06/21/24 Progress towards PT goals: Progressing toward goals    Frequency    Min 2X/week      PT Plan      Co-evaluation PT/OT/SLP Co-Evaluation/Treatment: Yes Reason for Co-Treatment: Complexity of the patient's impairments (multi-system involvement);For patient/therapist safety;Necessary to address cognition/behavior during functional activity;To address functional/ADL transfers PT goals addressed during session: Balance;Strengthening/ROM;Mobility/safety with mobility OT goals addressed during session: ADL's and self-care;Strengthening/ROM      AM-PAC PT 6 Clicks Mobility   Outcome Measure  Help needed turning from your back to your side while in a flat bed without using bedrails?: Total Help needed moving from lying on your back to sitting on the side of a flat bed without using bedrails?: Total Help needed moving to and from a bed to a chair (including a wheelchair)?: Total Help needed standing up from a chair using your arms (e.g., wheelchair or bedside chair)?: Total Help needed to walk in hospital room?: Total Help needed climbing 3-5 steps with a railing? : Total 6 Click Score: 6    End of Session   Activity Tolerance: Patient limited by fatigue Patient left: in bed;with call bell/phone within reach;with family/visitor present;with nursing/sitter in room Nurse Communication: Mobility status PT Visit Diagnosis: Muscle weakness (generalized) (M62.81);Difficulty in walking, not elsewhere classified  (R26.2);Other symptoms and signs involving the nervous system (R29.898);Hemiplegia and hemiparesis Hemiplegia - Right/Left: Right Hemiplegia - dominant/non-dominant: Dominant Hemiplegia - caused by: Other Nontraumatic intracranial hemorrhage     Time: 0841-0930 PT Time Calculation (min) (ACUTE ONLY): 49 min  Charges:    $Therapeutic Activity: 23-37 mins PT General Charges $$ ACUTE PT VISIT: 1 Visit                     Darryle George, PTA Acute Rehabilitation Services Secure Chat Preferred  [phone]    Darryle George 06/18/2024, 12:36 PM

## 2024-06-18 NOTE — Progress Notes (Signed)
 Speech Language Pathology Treatment: Dysphagia;Cognitive-Linguistic  Patient Details Name: Justin Rasmussen MRN: [id number] DOB: 07/02/1956 Today's Date: 06/18/2024 Time: 8568-8552 SLP Time Calculation (min) (ACUTE ONLY): 16 min  Assessment / Plan / Recommendation Clinical Impression  Pt has progressed significantly towards established SLP goals. He is alert and spontaneously using social language. He needs increased cueing when participating in more structured tasks, including repetition, yes/no questions, and confrontation naming. With Mod cueing, he initiates verbal responses that are characterized by semantic paraphasias. Repetition is accurate at the word and phrase level but he did not initiate as complexity increased to sentences. He was >50% accurate with naming familiar objects, though this is when paraphasias were most evident but he can intermittently correct himself given Max multimodal cueing. He continues to answer yes/no questions all in the affirmative. Pt is more attentive to his R side, using his R hand to feed himself and visually attending. His dentures were present but not in place while he fed himself regular solids, achieving prompt and complete oral clearance despite edentulism. No signs clinically concerning for aspiration were observed with consecutive sips of thin liquids. Continue current diet with full supervision. SLP will continue following for ongoing assessment.    HPI HPI: 68 y.o. male history of melanoma currently in the process of getting immunotherapy presented 06/02/24 with altered mental status, aphasia, and Rt sided weakness. Initial CT scan showing a left frontal hemorrhage. MRI showed underlying mass. S/p L frontal crani for tumor resection and evacuation of L frontal hematoma & transbronchial needle aspiration 9/30. Extubated 10/1. Questionable seizure activity noted during hospitalization. Became hypotensive and transferred to ICU on BiPAP 10/5. Repeat CTH stable.  PMH-COPD, HF, cardiomyopathy, pulmonary HTN, OSA, melanoma on immunotherapy      SLP Plan  Continue with current plan of care          Recommendations  Diet recommendations: Dysphagia 3 (mechanical soft);Thin liquid Liquids provided via: Cup;Straw Medication Administration: Whole meds with puree Supervision: Staff to assist with self feeding;Full supervision/cueing for compensatory strategies Compensations: Minimize environmental distractions;Small sips/bites;Slow rate Postural Changes and/or Swallow Maneuvers: Seated upright 90 degrees                  Oral care BID   Frequent or constant Supervision/Assistance Dysphagia, unspecified (R13.10);Aphasia (R47.01);Cognitive communication deficit (M58.158)     Continue with current plan of care     Damien Blumenthal, M.A., CCC-SLP Speech Language Pathology, Acute Rehabilitation Services  Secure Chat preferred [phone]   06/18/2024, 3:05 PM

## 2024-06-18 NOTE — Progress Notes (Signed)
 Nutrition Follow-up  DOCUMENTATION CODES:  Morbid obesity  INTERVENTION:  Continue current diet as ordered Ordering assistance Encouraged PO intake of fluid and meals Ensure Plus High Protein po BID, each supplement provides 350 kcal and 20 grams of protein  NUTRITION DIAGNOSIS:  Inadequate oral intake related to inability to eat as evidenced by NPO status. - remains applicable  GOAL:  Patient will meet greater than or equal to 90% of their needs - progressing  MONITOR:  TF tolerance, Labs  REASON FOR ASSESSMENT:  Consult Enteral/tube feeding initiation and management  ASSESSMENT:  Pt with PMH of pulmonary HTN, HLD, OSA on CPAP at night, HF, COPD, and melanoma on immunotherapy (nivolumab ) admitted with R-sided weakness and aphasia, found to have L frontal ICH from hemorrhagic tumor s/p craniotomy.  9/27 - admit with L frontal ICH 9/28 - per MRI pt with hemorrhagic tumor 9/30 - s/p craniotomy for tumor resection 10/1 - extubated 10/3 - s/p cortrak placement; tip gastric 10/12 - pt pulled cortrak, BSE SLP and advanced to DYS3/thin  Pt resting in bed at the time of assessment. Awake and alert. Able to answer questions with simple answers but still having some aphasia. SLP worked with pt today and noted good progress. Pt  removed cortrak over the weekend. Sodium was high and had to be started on D5. Urine still dark in canister. Discussed with pt that he would need to try and actively take in more fluids to account for the loss of free water flushes and IVF once it was discontinued. Pt expresses understanding. No meals recorded, but RN reports good intake is reported by daughter so far. Noted several outside food items in room.  Pt agreeable to ensure and prefers vanilla.   Admit weight: 138.1 kg  Current weight: 126.7 kg    Nutritionally Relevant Medications: Scheduled Meds:  dexamethasone    4 mg Intravenous Q6H   insulin aspart  0-20 Units Subcutaneous Q4H   insulin  glargine  10 Units Subcutaneous Daily   levETIRAcetam  500 mg Intravenous Q12H   multivitamin with minerals  1 tablet Oral Daily   pantoprazole IV  40 mg Intravenous QHS   PRN Meds: docusate sodium, ondansetron , polyethylene glycol, promethazine  Labs Reviewed: Chloride 113 BUN 40 CBG ranges from 152-250 mg/dL over the last 24 hours HgbA1c 5.5%  NUTRITION - FOCUSED PHYSICAL EXAM: Flowsheet Row Most Recent Value  Orbital Region No depletion  Upper Arm Region No depletion  Thoracic and Lumbar Region No depletion  Buccal Region Mild depletion  Temple Region No depletion  Clavicle Bone Region Moderate depletion  Clavicle and Acromion Bone Region Moderate depletion  Scapular Bone Region Unable to assess  Dorsal Hand Unable to assess  Patellar Region No depletion  Anterior Thigh Region No depletion  Posterior Calf Region No depletion  Edema (RD Assessment) None  Hair Reviewed  Eyes Reviewed  Mouth Unable to assess  Skin Reviewed  Nails Unable to assess    Diet Order:   Diet Order             DIET DYS 3 Room service appropriate? Yes with Assist; Fluid consistency: Thin  Diet effective now                   EDUCATION NEEDS:  No education needs have been identified at this time  Skin:  Skin Assessment: Reviewed RN Assessment (head incision)  Last BM:  10/13 - type 7 FMS placed 10/9  Height:  Ht Readings from Last  1 Encounters:  06/05/24 5' 7 (1.702 m)    Weight:  Wt Readings from Last 1 Encounters:  06/18/24 126.7 kg    Ideal Body Weight:     BMI:  Body mass index is 43.75 kg/m.  Estimated Nutritional Needs:  Kcal:  1900-2100 Protein:  110-125 grams Fluid:  >1.9 L/day    Vernell Lukes, RD, LDN, CNSC Registered Dietitian II Please reach out via secure chat

## 2024-06-18 NOTE — TOC Progression Note (Signed)
 Transition of Care [facility]) - Progression Note    Patient Details  Name: Justin Rasmussen MRN: [id number] Date of Birth: 1955-12-31  [facility] CM/SW Contact  Andrez JULIANNA George, RN Phone Number: 06/18/2024, 2:03 PM  Clinical Narrative:     CIR not sure if able to tolerate IR.  Cortrak out and attempting diet.  IP Care management following.  Expected Discharge Plan: IP Rehab Facility Barriers to Discharge: Continued Medical Work up               Expected Discharge Plan and Services   Discharge Planning Services: CM Consult   Living arrangements for the past 2 months: Single Family Home                                       Social Drivers of Health (SDOH) Interventions SDOH Screenings   Food Insecurity: No Food Insecurity (06/04/2024)  Housing: Low Risk  (06/04/2024)  Transportation Needs: No Transportation Needs (06/04/2024)  Utilities: Not At Risk (06/04/2024)  Depression (PHQ2-9): Low Risk  (07/12/2023)  Physical Activity: Unknown (06/01/2019)  Social Connections: Moderately Isolated (06/13/2024)  Stress: No Stress Concern Present (06/01/2019)  Tobacco Use: Medium Risk (06/05/2024)    Readmission Risk Interventions     No data to display

## 2024-06-18 NOTE — Procedures (Deleted)
 Cortrak  Person Inserting Tube:  Mady Dolly, RD Tube Type:  Cortrak - 43 inches Tube Size:  10 Tube Location:  Left nare Secured by: Bridle Technique Used to Measure Tube Placement:  Marking at nare/corner of mouth Cortrak Secured At:  70 cm Initial Placement Verification:  Xray  Cortrak Tube Team Note:  Consult received to place a Cortrak feeding tube.   X-ray is required. RN may begin using tube once appropriate positioning confirmed.   If the tube becomes dislodged please keep the tube and contact the Cortrak team at [email or web address] for replacement.  If after hours and replacement cannot be delayed, place a NG tube and confirm placement with an abdominal x-ray.    Dolly Mady MS, RD, LDN Registered Dietitian Clinical Nutrition RD Inpatient Contact Info in Amion

## 2024-06-18 NOTE — Progress Notes (Signed)
   06/18/24 1000  What Happened  Was fall witnessed? Yes  Who witnessed fall? Darryle George, PTA and Leita Odea, PT  Patients activity before fall during therapy  Point of contact buttocks  Was patient injured? No  Provider Notification  Provider Name/Title Dr. Jillian  Date Provider Notified 06/18/24  Time Provider Notified [phone]  Method of Notification Page (Secure chat)  Notification Reason Fall  Provider response No new orders  Date of Provider Response 06/18/24  Time of Provider Response [phone]  Follow Up  Family notified Yes - comment  Time family notified 0910  Additional tests No  Progress note created (see row info) Yes  Adult Fall Risk Assessment  Risk Factor Category (scoring not indicated) Fall has occurred during this admission (document High fall risk)  Patient Fall Risk Level High fall risk  Adult Fall Risk Interventions  Required Bundle Interventions *See Row Information* High fall risk  Additional Interventions Use of appropriate toileting equipment (bedpan, BSC, etc.);Family Supervision  Screening for Fall Injury Risk (To be completed on HIGH fall risk patients) - Assessing Need for Floor Mats  Risk For Fall Injury- Criteria for Floor Mats Previous fall this admission  Will Implement Floor Mats Yes  Vitals  Temp 97.7 F (36.5 C)  Temp Source Oral  BP 118/69  MAP (mmHg) 84  BP Location Left Arm  BP Method Automatic  Patient Position (if appropriate) Lying  Pulse Rate Source Monitor  ECG Heart Rate 69  Resp 19  Oxygen Therapy  SpO2 96 %  O2 Device Room Air  Pain Assessment  Pain Scale PAINAD  PAINAD (Pain Assessment in Advanced Dementia)  Breathing 0  Negative Vocalization 0  Facial Expression 0  Body Language 0  Consolability 0  PAINAD Score 0  Neurological  Neuro (WDL) X  Level of Consciousness Alert  Orientation Level Oriented to person  Cognition Follows commands  Speech Nods/gestures appropriately;Incomprehensible  Glasgow Coma Scale   Eye Opening 4  Best Verbal Response (NON-intubated) 2  Best Motor Response 6  Glasgow Coma Scale Score 12  NIH Stroke Scale   Dizziness Present No  Headache Present No  Interval Other (Comment)  Level of Consciousness (1a.)    0  LOC Questions (1b. )    2  LOC Commands (1c. )    1  Best Gaze (2. )   0  Visual (3. )   0  Facial Palsy (4. )     0  Motor Arm, Left (5a. )    0  Motor Arm, Right (5b. )  4  Motor Leg, Left (6a. )   4  Motor Leg, Right (6b. )  4  Limb Ataxia (7. ) 1  Sensory (8. )   1  Best Language (9. )   3  Dysarthria (10. ) 2  Extinction/Inattention (11.)    0  Complete NIHSS TOTAL 22  Musculoskeletal  Musculoskeletal (WDL) X  Assistive Device None  Generalized Weakness Yes  Weight Bearing Restrictions Per Provider Order No  Integumentary  Integumentary (WDL) X  Skin Color Appropriate for ethnicity  Skin Condition Dry;Flaky  Skin Integrity Ecchymosis;Erythema/redness  Ecchymosis Location Arm  Ecchymosis Location Orientation Bilateral  Erythema/Redness Location Buttocks  Erythema/Redness Location Orientation Bilateral  Rash Location Perineum  Skin Turgor Non-tenting

## 2024-06-18 NOTE — Progress Notes (Signed)
 PROGRESS NOTE  Justin Rasmussen  FMW:[id number] DOB: 1956-08-04 DOA: 06/02/2024 PCP: Colette Torrence GRADE, MD   Brief Narrative: Patient is a 68 year old male with history of pulm hypertension, hyperlipidemia, OSA on CPAP, HFpEF, COPD, melanoma presented with right-sided weakness and aphasia.  Imaging showed left frontal lobe ICH secondary to hemorrhagic metastatic mass with associated worsening edema.  Patient required ICU admission.  Neurosurgery was consulted and he underwent crani with hematoma evacuation and tumor resection.  Hospital course remarkable for dysphagia requiring feeding tube placement, respiratory failure requiring intubation, septic shock due to aspiration pneumonia.  Currently hemodynamically stable.  Tolerating dysphagia 3 diet.  PT/OT recommend CIR on discharge.  Assessment & Plan:  Principal Problem:   Intracranial hemorrhage (HCC) Active Problems:   Morbid obesity (HCC)   Elevated hemidiaphragm-Rt   OSA (obstructive sleep apnea)   Melanoma metastatic to brain (HCC)   Dysphagia   Hypernatremia   HTN (hypertension)   Chronic diastolic CHF (congestive heart failure) (HCC)   AKI (acute kidney injury)   COPD (chronic obstructive pulmonary disease) (HCC)   Prediabetes   Acute left frontal lobe intracranial hemorrhage: Presented with right-sided weakness, aphasia.  CT head showed hemorrhage in the anterior left frontal lobe measuring 4.3 X6.2X 4.2 cm, worsening edema with mass effect and 9 mm midline shift.MRI brain (9/28) significant for a hemorrhagic tumor in the anterior left frontal lobe, with a 2.5 cm enhancing tumor and increased hematoma. CTA head and neck (9/27) significant for no large vessel occlusion. LDL of 93. Hemoglobin A1C of 5.5%. Transthoracic Echocardiogram significant for an LVEF of 60-65% and no atrial level shunt . Neurosurgery consulted.  Started on Decadron .  Underwent creatinine with hematoma evacuation vertebral dissection on 9/30.  Neurosurgery  recommended long steroid taper.Has been started on keppra  Metastatic melanoma: Under treatment with immunotherapy as an outpatient.  We recommend to follow-up with oncology as an outpatient.  History of COPD: Currently not on exertion.  Continue bronchodilators as needed  AKI: Resolved  Septic shock: Presumed to be from aspiration pneumonia.  Required vasopressor support and was given IV antibiotics.  Vasopressors have been weaned off.  Currently blood pressure stable.  On Zosyn  Dysphagia: Likely related to ICH, acute illness.  Speech therapy/dietitian following.  Started on tube feeding.  Currently he is on dysphagia 3 diet.  He accidentally removed his core track.  May not need to insert again.  History of HFpEF: Currently euvolemic.  Echo showed EF of 60 to 60% with normal diastolic parameters.   OSA: CPAP at bedtime.  History of pulmonary hypertension  Hypertension: On Coreg  and Entresto  as an outpatient.  Continue home Coreg  and Entresto   History of hyperlipidemia: On Crestor  as an outpatient  Tibial mass: Unclear etiology.  No abnormalities noted on x-ray  Diabetes type 2: Well-controlled.  A1c of 5.5.  Was taking Farxiga  as an outpatient.  Currently on Lantus and sliding scale.  Monitor blood sugars  Debility/deconditioning: Secondary to subdural hemorrhage.  PT /OT recommending CIR  on discharge  Morbid obesity: BMI 43.7     Nutrition Problem: Inadequate oral intake Etiology: inability to eat    DVT prophylaxis:SCDs Start: 06/02/24 2111     Code Status: Full Code  Family Communication: Wife at bedside on 10/13  Patient status:Inpatient  Patient is from :home  Anticipated discharge to:CIR  Estimated DC date:2-3 days   Consultants: Neurology,neurosurgery  Procedures:as above  Antimicrobials:  Anti-infectives (From admission, onward)    Start     Dose/Rate Route  Frequency Ordered Stop   06/11/24 2200  piperacillin-tazobactam (ZOSYN) IVPB 3.375 g         3.375 g 12.5 mL/hr over 240 Minutes Intravenous Every 8 hours 06/11/24 1249 06/17/24 2159   06/10/24 1200  piperacillin-tazobactam (ZOSYN) IVPB 3.375 g  Status:  Discontinued        3.375 g 12.5 mL/hr over 240 Minutes Intravenous Every 6 hours 06/10/24 1001 06/11/24 1249   06/10/24 1045  Ampicillin-Sulbactam (UNASYN) 3 g in sodium chloride  0.9 % 100 mL IVPB  Status:  Discontinued        3 g 200 mL/hr over 30 Minutes Intravenous Every 6 hours 06/10/24 0955 06/10/24 1000       Subjective: Patient seen and examined at bedside today.  Hemodynamically stable.  Comfortable.  Mostly oriented.  Alert and awake.  Wife at bedside.  He was eating his breakfast.  On dysphagia diet.  Denies any new complaints today.  Rectal tube still draining liquid stool.  Objective: Vitals:   06/18/24 0450 06/18/24 0455 06/18/24 0812 06/18/24 0922  BP: (!) 140/68  112/74 118/69  Pulse: 69  64 71  Resp: 18  14 16   Temp: 97.9 F (36.6 C)  98.2 F (36.8 C)   TempSrc: Oral     SpO2: 96%  95% 96%  Weight:  126.7 kg    Height:        Intake/Output Summary (Last 24 hours) at 06/18/2024 0936 Last data filed at 06/18/2024 0342 Gross per 24 hour  Intake 6494.25 ml  Output 900 ml  Net 5594.25 ml   Filed Weights   06/16/24 0103 06/17/24 0116 06/18/24 0455  Weight: 128 kg 125.2 kg 126.7 kg    Examination:  General exam: Overall comfortable, not in distress, morbidly obese HEENT: PERRL, staples on the left side of the head Respiratory system:  no wheezes or crackles  Cardiovascular system: S1 & S2 heard, RRR.  Gastrointestinal system: Abdomen is nondistended, soft and nontender. Central nervous system: Alert and oriented Extremities: No edema, no clubbing ,no cyanosis Skin: No rashes, no ulcers,no icterus GU: Rectal tube   Data Reviewed: I have personally reviewed following labs and imaging studies  CBC: Recent Labs  Lab 06/14/24 0129 06/15/24 0122 06/16/24 0404 06/17/24 0607 06/18/24 0237   WBC 12.5* 13.6* 13.2* 14.4* 11.9*  HGB 13.3 12.3* 11.4* 13.0 12.3*  HCT 44.0 40.4 37.9* 41.1 38.7*  MCV 86.6 85.6 86.5 83.9 83.0  PLT 142* 113* 116* 117* 117*   Basic Metabolic Panel: Recent Labs  Lab 06/13/24 0255 06/14/24 0129 06/14/24 1742 06/15/24 0122 06/15/24 1427 06/16/24 0404 06/16/24 1335 06/16/24 1922 06/17/24 0607 06/17/24 1228 06/17/24 1807 06/18/24 0237  NA 151* 153*   < > 154*   < > 153*   < > 151* 153* 152* 149* 145  K 4.6 4.5  --  4.2  --  4.3  --   --  4.2  --   --  4.4  CL 118* 120*  --  120*  --  119*  --   --  116*  --   --  113*  CO2 24 25  --  25  --  27  --   --  24  --   --  23  GLUCOSE 220* 135*  --  163*  --  149*  --   --  152*  --   --  166*  BUN 76* 66*  --  68*  --  52*  --   --  44*  --   --  40*  CREATININE 1.37* 1.16  --  1.16  --  0.96  --   --  0.91  --   --  0.90  CALCIUM  8.2* 8.3*  --  8.1*  --  7.9*  --   --  8.1*  --   --  7.9*  MG 3.3*  --   --   --   --   --   --   --   --   --   --   --   PHOS 3.4  --   --   --   --   --   --   --   --   --   --   --    < > = values in this interval not displayed.     No results found for this or any previous visit (from the past 240 hours).   Radiology Studies: No results found.  Scheduled Meds:  carvedilol   6.25 mg Oral BID WC   Chlorhexidine Gluconate Cloth  6 each Topical Daily   dexamethasone  (DECADRON ) injection  4 mg Intravenous Q6H   enoxaparin  (LOVENOX ) injection  60 mg Subcutaneous Q24H   feeding supplement (OSMOLITE 1.5 CAL)  1,200 mL Per Tube Q24H   feeding supplement (PROSource TF20)  60 mL Per Tube TID   free water  300 mL Per Tube Q4H   Gerhardt's butt cream   Topical BID   insulin aspart  0-20 Units Subcutaneous Q4H   insulin glargine  10 Units Subcutaneous Daily   levETIRAcetam  500 mg Intravenous Q12H   multivitamin with minerals  1 tablet Oral Daily   mouth rinse  15 mL Mouth Rinse QID   pantoprazole (PROTONIX) IV  40 mg Intravenous QHS   sacubitril -valsartan   1  tablet Oral BID   Continuous Infusions:  dextrose  40 mL/hr at 06/18/24 0341     LOS: 16 days   Ivonne Mustache, MD [facility] P10/13/2025, 9:36 AM

## 2024-06-18 NOTE — Progress Notes (Signed)
 Inpatient Rehab Admissions Coordinator:   Continues to require significant assist for all mobility/adls.  Likely will require prolonged rehab at lower level of care.   Reche Lowers, PT, DPT Admissions Coordinator [phone] 06/18/24  4:33 PM

## 2024-06-18 NOTE — Progress Notes (Signed)
 Occupational Therapy Treatment Patient Details Name: Justin Rasmussen MRN: [id number] DOB: March 19, 1956 Today's Date: 06/18/2024   History of present illness 68 y.o. male history of melanoma currently in the process of getting immunotherapy presented 06/02/24 with altered mental status, aphasia, and Rt sided weakness. Initial CT scan showing a left frontal hemorrhage. MRI showed underlying mass. S/p L frontal crani for tumor resection and evacuation of L frontal hematoma & transbronchial needle aspiration 9/30. Extubated 10/1. Questionable seizure activity noted during hospitalization. Becam hypotensive with respiratory decline required BiPAP and transfer to ICU; PMH-COPD, HF, cardiomyopathy, pulmonary HTN, OSA, melanoma on immunotherapy   OT comments  Pt progressing towards OT goals this session. More alert, responsive to single step commands (at about 75% accuracy) improved attention during functional tasks. Pt with improved bed mobility and initiation for UB and LB movements - ultimately requiring max A +2 to complete rolling and come to sitting EOB. Pt initially mod A for sitting balance, progressing to min guard. Pt then lost balance and with therapist assist was lowered to the ground. Pt unharmed, calm, unafraid throughout. Pt then lifted via maximove back to bed. Correct paperwork filed. Once in supine in bed confirmed that Pt unhurt and comfortable. Wife present to witness lift from ground to bed. Pt overall with improvement in cognition and strength, continue to recommend post-acute rehab of >3 hours to maximize safety and independence in ADL and functional transfers      If plan is discharge home, recommend the following:  Two people to help with walking and/or transfers;Two people to help with bathing/dressing/bathroom;Direct supervision/assist for medications management;Direct supervision/assist for financial management;Assist for transportation;Supervision due to cognitive status;Help with stairs  or ramp for entrance   Equipment Recommendations  Other (comment) (defer to next cvenue)    Recommendations for Other Services      Precautions / Restrictions Precautions Precautions: Fall;Other (comment) Recall of Precautions/Restrictions: Intact Restrictions Weight Bearing Restrictions Per Provider Order: No       Mobility Bed Mobility Overal bed mobility: Needs Assistance Bed Mobility: Rolling, Sidelying to Sit, Sit to Sidelying Rolling: Max assist, +2 for safety/equipment, +2 for physical assistance Sidelying to sit: Max assist, +2 for physical assistance, +2 for safety/equipment       General bed mobility comments: Pt with improved initiation this session. better command following, continues to benefit from multimodal cues and increased time    Transfers                   General transfer comment: via maximove this session     Balance Overall balance assessment: Needs assistance Sitting-balance support: Single extremity supported, Feet supported Sitting balance-Leahy Scale: Poor Sitting balance - Comments: sitting EOB on air mattress (size wise) with mod A progressing to min guard pt required cues for postural control Postural control: Right lateral lean, Posterior lean                                 ADL either performed or assessed with clinical judgement   ADL Overall ADL's : Needs assistance/impaired     Grooming: Moderate assistance;Wash/dry face;Sitting Grooming Details (indicate cue type and reason): supported sitting EOB, assist for sustaining task, prefers LUE but encouraged use of hand over hand with R (as Pt is R handed) Upper Body Bathing: Maximal assistance Upper Body Bathing Details (indicate cue type and reason): sitting EOB, working on balance  Toilet Transfer: Total assistance Toilet Transfer Details (indicate cue type and reason): via maximove           General ADL Comments: improvong inetntional  movement during ADL    Extremity/Trunk Assessment Upper Extremity Assessment Upper Extremity Assessment: RUE deficits/detail;LUE deficits/detail RUE Deficits / Details: can bring to chin, improving elbow movement - however shoulder remains limited. attempted finger trageting with 15% success. RUE Coordination: decreased fine motor;decreased gross motor   Lower Extremity Assessment Lower Extremity Assessment: Defer to PT evaluation        Vision       Perception     Praxis     Communication Communication Communication: Impaired Factors Affecting Communication: Difficulty expressing self   Cognition Arousal: Alert Behavior During Therapy: WFL for tasks assessed/performed Cognition: Cognition impaired, Difficult to assess Difficult to assess due to: Impaired communication       Attention impairment (select first level of impairment): Sustained attention Executive functioning impairment (select all impairments): Initiation, Problem solving OT - Cognition Comments: Pt with improved single command following (approx 75%), better attention today sustaining for longer periods of time                 Following commands: Impaired Following commands impaired: Follows one step commands inconsistently, Follows one step commands with increased time      Cueing   Cueing Techniques: Verbal cues, Gestural cues, Tactile cues, Visual cues  Exercises      Shoulder Instructions       General Comments      Pertinent Vitals/ Pain       Pain Assessment Pain Assessment: No/denies pain Pain Intervention(s): Monitored during session, Repositioned  Home Living                                          Prior Functioning/Environment              Frequency  Min 2X/week        Progress Toward Goals  OT Goals(current goals can now be found in the care plan section)  Progress towards OT goals: Progressing toward goals  Acute Rehab OT Goals Patient  Stated Goal: get better OT Goal Formulation: With patient Time For Goal Achievement: 06/22/24 Potential to Achieve Goals: Good  Plan      Co-evaluation    PT/OT/SLP Co-Evaluation/Treatment: Yes Reason for Co-Treatment: Complexity of the patient's impairments (multi-system involvement);For patient/therapist safety;Necessary to address cognition/behavior during functional activity;To address functional/ADL transfers PT goals addressed during session: Balance;Strengthening/ROM;Mobility/safety with mobility OT goals addressed during session: ADL's and self-care;Strengthening/ROM      AM-PAC OT 6 Clicks Daily Activity     Outcome Measure   Help from another person eating meals?: A Lot Help from another person taking care of personal grooming?: A Lot Help from another person toileting, which includes using toliet, bedpan, or urinal?: Total Help from another person bathing (including washing, rinsing, drying)?: A Lot Help from another person to put on and taking off regular upper body clothing?: A Lot Help from another person to put on and taking off regular lower body clothing?: Total 6 Click Score: 10    End of Session Equipment Utilized During Treatment: Other (comment) (maxi move)  OT Visit Diagnosis: Unsteadiness on feet (R26.81);Other abnormalities of gait and mobility (R26.89);Muscle weakness (generalized) (M62.81);Low vision, both eyes (H54.2);Other symptoms and signs involving the nervous system (R29.898);Other symptoms and  signs involving cognitive function;Cognitive communication deficit (R41.841);Hemiplegia and hemiparesis Symptoms and signs involving cognitive functions: Other Nontraumatic ICH Hemiplegia - Right/Left: Right Hemiplegia - dominant/non-dominant: Dominant Hemiplegia - caused by: Other Nontraumatic intracranial hemorrhage   Activity Tolerance Patient tolerated treatment well   Patient Left in bed;with call bell/phone within reach;with bed alarm set;with  family/visitor present   Nurse Communication Mobility status;Need for lift equipment;Precautions        Time: 9158-9069 OT Time Calculation (min): 49 min  Charges: OT General Charges $OT Visit: 1 Visit OT Treatments $Self Care/Home Management : 8-22 mins  Leita DEL OTR/L Acute Rehabilitation Services Office: [phone]  Leita PARAS [facility] 06/18/2024, 11:22 AM

## 2024-06-19 ENCOUNTER — Encounter: Payer: Self-pay | Admitting: Emergency Medicine

## 2024-06-19 DIAGNOSIS — I629 Nontraumatic intracranial hemorrhage, unspecified: Secondary | ICD-10-CM | POA: Diagnosis not present

## 2024-06-19 LAB — GLUCOSE, CAPILLARY
Glucose-Capillary: 143 mg/dL — ABNORMAL HIGH (ref 70–99)
Glucose-Capillary: 123 mg/dL — ABNORMAL HIGH (ref 70–99)
Glucose-Capillary: 145 mg/dL — ABNORMAL HIGH (ref 70–99)
Glucose-Capillary: 92 mg/dL (ref 70–99)
Glucose-Capillary: 139 mg/dL — ABNORMAL HIGH (ref 70–99)

## 2024-06-19 MED ORDER — INSULIN ASPART 100 UNIT/ML IJ SOLN
0.0000 [IU] | Freq: Three times a day (TID) | INTRAMUSCULAR | Status: DC
  Administered 2024-06-19 (×2): 3 [IU] via SUBCUTANEOUS
  Administered 2024-06-20: 7 [IU] via SUBCUTANEOUS
  Administered 2024-06-20: 4 [IU] via SUBCUTANEOUS
  Administered 2024-06-20 – 2024-06-21 (×2): 3 [IU] via SUBCUTANEOUS
  Administered 2024-06-21 (×3): 4 [IU] via SUBCUTANEOUS
  Administered 2024-06-22: 3 [IU] via SUBCUTANEOUS

## 2024-06-19 MED ORDER — DEXAMETHASONE SODIUM PHOSPHATE 4 MG/ML IJ SOLN
4.0000 mg | Freq: Three times a day (TID) | INTRAMUSCULAR | Status: DC
  Administered 2024-06-19 – 2024-06-21 (×6): 4 mg via INTRAVENOUS
  Filled 2024-06-19 (×6): qty 1

## 2024-06-19 NOTE — Progress Notes (Signed)
 PROGRESS NOTE  Justin Rasmussen  FMW:[id number] DOB: July 22, 1956 DOA: 06/02/2024 PCP: Colette Torrence GRADE, MD   Brief Narrative: Patient is a 68 year old male with history of pulm hypertension, hyperlipidemia, OSA on CPAP, HFpEF, COPD, melanoma presented with right-sided weakness and aphasia.  Imaging showed left frontal lobe ICH secondary to hemorrhagic metastatic mass with associated worsening edema.  Patient required ICU admission.  Neurosurgery was consulted and he underwent crani with hematoma evacuation and tumor resection.  Hospital course remarkable for dysphagia requiring feeding tube placement, respiratory failure requiring intubation, septic shock due to aspiration pneumonia.  Currently hemodynamically stable.  Tolerating dysphagia 3 diet.  PT/OT recommend CIR /SNF on discharge.  Medically stable for discharge whenever possible..  TOC following  Assessment & Plan:  Principal Problem:   Intracranial hemorrhage (HCC) Active Problems:   Morbid obesity (HCC)   Elevated hemidiaphragm-Rt   OSA (obstructive sleep apnea)   Melanoma metastatic to brain (HCC)   Dysphagia   Hypernatremia   HTN (hypertension)   Chronic diastolic CHF (congestive heart failure) (HCC)   AKI (acute kidney injury)   COPD (chronic obstructive pulmonary disease) (HCC)   Prediabetes   Acute left frontal lobe intracranial hemorrhage: Presented with right-sided weakness, aphasia.  CT head showed hemorrhage in the anterior left frontal lobe measuring 4.3 X6.2X 4.2 cm, worsening edema with mass effect and 9 mm midline shift.MRI brain (9/28) significant for a hemorrhagic tumor in the anterior left frontal lobe, with a 2.5 cm enhancing tumor and increased hematoma. CTA head and neck (9/27) significant for no large vessel occlusion. LDL of 93. Hemoglobin A1C of 5.5%. Transthoracic Echocardiogram significant for an LVEF of 60-65% and no atrial level shunt . Neurosurgery consulted.  Started on Decadron .  Underwent creatinine with  hematoma evacuation vertebral dissection on 9/30.  Neurosurgery recommended long steroid taper.Has been started on keppra.  Decadron  reduced to 4 mg every 8 hours from 6 hours  Metastatic melanoma: Under treatment with immunotherapy as an outpatient.  We recommend to follow-up with oncology as an outpatient.  History of COPD: Currently not on exertion.  Continue bronchodilators as needed  AKI: Resolved  Septic shock: Presumed to be from aspiration pneumonia.  Required vasopressor support and was given IV antibiotics.  Vasopressors have been weaned off.  Currently blood pressure stable.  Completed antibiotics course  Dysphagia: Likely related to ICH, acute illness.  Speech therapy/dietitian following.  Started on tube feeding.  Currently he is on dysphagia 3 diet.  He accidentally removed his core track.  No need to insert again.  History of HFpEF: Currently euvolemic.  Echo showed EF of 60 to 60% with normal diastolic parameters.   OSA: CPAP at bedtime.  History of pulmonary hypertension  Hypertension: On Coreg  and Entresto  as an outpatient.  Continue home Coreg  and Entresto   History of hyperlipidemia: On Crestor  as an outpatient  Tibial mass: Unclear etiology.  No abnormalities noted on x-ray  Diabetes type 2: Well-controlled.  A1c of 5.5.  Was taking Farxiga  as an outpatient.  Currently on Lantus and sliding scale.  Monitor blood sugars  Debility/deconditioning: Secondary to subdural hemorrhage.  PT /OT recommending CIR  on discharge  Morbid obesity: BMI 43.7     Nutrition Problem: Inadequate oral intake Etiology: inability to eat    DVT prophylaxis:SCDs Start: 06/02/24 2111     Code Status: Full Code  Family Communication: Wife at bedside on 10/14  Patient status:Inpatient  Patient is from :home  Anticipated discharge to:CIR/SNF  Estimated DC date:whenever possible  Consultants: Neurology,neurosurgery  Procedures:as above  Antimicrobials:  Anti-infectives  (From admission, onward)    Start     Dose/Rate Route Frequency Ordered Stop   06/11/24 2200  piperacillin-tazobactam (ZOSYN) IVPB 3.375 g        3.375 g 12.5 mL/hr over 240 Minutes Intravenous Every 8 hours 06/11/24 1249 06/17/24 2159   06/10/24 1200  piperacillin-tazobactam (ZOSYN) IVPB 3.375 g  Status:  Discontinued        3.375 g 12.5 mL/hr over 240 Minutes Intravenous Every 6 hours 06/10/24 1001 06/11/24 1249   06/10/24 1045  Ampicillin-Sulbactam (UNASYN) 3 g in sodium chloride  0.9 % 100 mL IVPB  Status:  Discontinued        3 g 200 mL/hr over 30 Minutes Intravenous Every 6 hours 06/10/24 0955 06/10/24 1000       Subjective: Patient seen and examined at the bedside today.  Hemodynamically stable.  Lying in bed.  Wife at bedside.  He ate his breakfast today.  Rectal tube has been removed.  No active issues.  Comfortable  Objective: Vitals:   06/19/24 0032 06/19/24 0410 06/19/24 0500 06/19/24 0810  BP:  117/71  118/72  Pulse: 76 70  81  Resp:  18    Temp:  (!) 97.5 F (36.4 C)  97.7 F (36.5 C)  TempSrc:  Oral  Oral  SpO2: 96% 98%  96%  Weight:   130.6 kg   Height:        Intake/Output Summary (Last 24 hours) at 06/19/2024 1054 Last data filed at 06/19/2024 1008 Gross per 24 hour  Intake 120 ml  Output 1350 ml  Net -1230 ml   Filed Weights   06/17/24 0116 06/18/24 0455 06/19/24 0500  Weight: 125.2 kg 126.7 kg 130.6 kg    Examination:   General exam: Overall comfortable, not in distress, morbidly obese HEENT: PERRL, staples on the left side of the head Respiratory system:  no wheezes or crackles  Cardiovascular system: S1 & S2 heard, RRR.  Gastrointestinal system: Abdomen is nondistended, soft and nontender. Central nervous system: Alert and oriented Extremities: No edema, no clubbing ,no cyanosis Skin: No rashes, no ulcers,no icterus     Data Reviewed: I have personally reviewed following labs and imaging studies  CBC: Recent Labs  Lab 06/14/24 0129  06/15/24 0122 06/16/24 0404 06/17/24 0607 06/18/24 0237  WBC 12.5* 13.6* 13.2* 14.4* 11.9*  HGB 13.3 12.3* 11.4* 13.0 12.3*  HCT 44.0 40.4 37.9* 41.1 38.7*  MCV 86.6 85.6 86.5 83.9 83.0  PLT 142* 113* 116* 117* 117*   Basic Metabolic Panel: Recent Labs  Lab 06/13/24 0255 06/14/24 0129 06/14/24 1742 06/15/24 0122 06/15/24 1427 06/16/24 0404 06/16/24 1335 06/16/24 1922 06/17/24 0607 06/17/24 1228 06/17/24 1807 06/18/24 0237  NA 151* 153*   < > 154*   < > 153*   < > 151* 153* 152* 149* 145  K 4.6 4.5  --  4.2  --  4.3  --   --  4.2  --   --  4.4  CL 118* 120*  --  120*  --  119*  --   --  116*  --   --  113*  CO2 24 25  --  25  --  27  --   --  24  --   --  23  GLUCOSE 220* 135*  --  163*  --  149*  --   --  152*  --   --  166*  BUN  76* 66*  --  68*  --  52*  --   --  44*  --   --  40*  CREATININE 1.37* 1.16  --  1.16  --  0.96  --   --  0.91  --   --  0.90  CALCIUM  8.2* 8.3*  --  8.1*  --  7.9*  --   --  8.1*  --   --  7.9*  MG 3.3*  --   --   --   --   --   --   --   --   --   --   --   PHOS 3.4  --   --   --   --   --   --   --   --   --   --   --    < > = values in this interval not displayed.     No results found for this or any previous visit (from the past 240 hours).   Radiology Studies: No results found.  Scheduled Meds:  carvedilol   6.25 mg Oral BID WC   dexamethasone  (DECADRON ) injection  4 mg Intravenous Q6H   enoxaparin  (LOVENOX ) injection  60 mg Subcutaneous Q24H   feeding supplement  237 mL Oral BID BM   Gerhardt's butt cream   Topical BID   insulin aspart  0-20 Units Subcutaneous TID WC & HS   insulin glargine  10 Units Subcutaneous Daily   levETIRAcetam  500 mg Intravenous Q12H   multivitamin with minerals  1 tablet Oral Daily   mouth rinse  15 mL Mouth Rinse QID   pantoprazole (PROTONIX) IV  40 mg Intravenous QHS   sacubitril -valsartan   1 tablet Oral BID   Continuous Infusions:     LOS: 17 days   Ivonne Mustache, MD [facility] P10/14/2025, 10:54 AM

## 2024-06-19 NOTE — Plan of Care (Signed)
  Problem: Education: Goal: Knowledge of disease or condition will improve Outcome: Progressing Goal: Knowledge of secondary prevention will improve (MUST DOCUMENT ALL) Outcome: Progressing Goal: Knowledge of patient specific risk factors will improve (DELETE if not current risk factor) Outcome: Progressing   Problem: Intracerebral Hemorrhage Tissue Perfusion: Goal: Complications of Intracerebral Hemorrhage will be minimized Outcome: Progressing   Problem: Coping: Goal: Will verbalize positive feelings about self Outcome: Progressing Goal: Will identify appropriate support needs Outcome: Progressing   Problem: Health Behavior/Discharge Planning: Goal: Ability to manage health-related needs will improve Outcome: Progressing Goal: Goals will be collaboratively established with patient/family Outcome: Progressing   Problem: Self-Care: Goal: Ability to participate in self-care as condition permits will improve Outcome: Progressing Goal: Ability to communicate needs accurately will improve Outcome: Progressing   Problem: Nutrition: Goal: Risk of aspiration will decrease Outcome: Progressing Goal: Dietary intake will improve Outcome: Progressing   Problem: Education: Goal: Knowledge of General Education information will improve Description: Including pain rating scale, medication(s)/side effects and non-pharmacologic comfort measures Outcome: Progressing   Problem: Clinical Measurements: Goal: Will remain free from infection Outcome: Progressing   Problem: Self-Care: Goal: Verbalization of feelings and concerns over difficulty with self-care will improve Outcome: Not Progressing

## 2024-06-20 DIAGNOSIS — I629 Nontraumatic intracranial hemorrhage, unspecified: Secondary | ICD-10-CM | POA: Diagnosis not present

## 2024-06-20 LAB — GLUCOSE, CAPILLARY
Glucose-Capillary: 136 mg/dL — ABNORMAL HIGH (ref 70–99)
Glucose-Capillary: 178 mg/dL — ABNORMAL HIGH (ref 70–99)
Glucose-Capillary: 104 mg/dL — ABNORMAL HIGH (ref 70–99)
Glucose-Capillary: 208 mg/dL — ABNORMAL HIGH (ref 70–99)

## 2024-06-20 MED ORDER — INSULIN GLARGINE-YFGN 100 UNIT/ML ~~LOC~~ SOLN
10.0000 [IU] | Freq: Every day | SUBCUTANEOUS | Status: DC
  Administered 2024-06-21 – 2024-06-22 (×2): 10 [IU] via SUBCUTANEOUS
  Filled 2024-06-20 (×2): qty 0.1

## 2024-06-20 NOTE — Progress Notes (Signed)
 PROGRESS NOTE  Justin Rasmussen  FMW:[id number] DOB: 1956-05-31 DOA: 06/02/2024 PCP: Colette Torrence GRADE, MD   Brief Narrative: Patient is a 68 year old male with history of pulm hypertension, hyperlipidemia, OSA on CPAP, HFpEF, COPD, melanoma presented with right-sided weakness and aphasia.  Imaging showed left frontal lobe ICH secondary to hemorrhagic metastatic mass with associated worsening edema.  Patient required ICU admission.  Neurosurgery was consulted and he underwent crani with hematoma evacuation and tumor resection.  Hospital course remarkable for dysphagia requiring feeding tube placement, respiratory failure requiring intubation, septic shock due to aspiration pneumonia.  Currently hemodynamically stable.  Tolerating dysphagia 3 diet.  PT/OT recommend acute inpatient rehab on discharge.  Medically stable for discharge whenever possible.  TOC following  Assessment & Plan:  Principal Problem:   Intracranial hemorrhage (HCC) Active Problems:   Morbid obesity (HCC)   Elevated hemidiaphragm-Rt   OSA (obstructive sleep apnea)   Melanoma metastatic to brain (HCC)   Dysphagia   Hypernatremia   HTN (hypertension)   Chronic diastolic CHF (congestive heart failure) (HCC)   AKI (acute kidney injury)   COPD (chronic obstructive pulmonary disease) (HCC)   Prediabetes   Acute left frontal lobe intracranial hemorrhage: Presented with right-sided weakness, aphasia.  CT head showed hemorrhage in the anterior left frontal lobe measuring 4.3 X6.2X 4.2 cm, worsening edema with mass effect and 9 mm midline shift.MRI brain (9/28) significant for a hemorrhagic tumor in the anterior left frontal lobe, with a 2.5 cm enhancing tumor and increased hematoma. CTA head and neck (9/27) significant for no large vessel occlusion. LDL of 93. Hemoglobin A1C of 5.5%. Transthoracic Echocardiogram significant for an LVEF of 60-65% and no atrial level shunt . Neurosurgery consulted.  Started on Decadron .  Underwent  creatinine with hematoma evacuation vertebral dissection on 9/30.  Neurosurgery recommended long steroid taper.Continue  on keppra.  Decadron  reduced to 4 mg every 8 hours from 6 hours, gradually taper  Metastatic melanoma: Under treatment with immunotherapy as an outpatient.  We recommend to follow-up with oncology as an outpatient.  History of COPD: Currently not on exertion.  Continue bronchodilators as needed  AKI: Resolved  Septic shock: Presumed to be from aspiration pneumonia.  Required vasopressor support and was given IV antibiotics.  Vasopressors have been weaned off.  Currently blood pressure stable.  Completed antibiotics course  Dysphagia: Likely related to ICH, acute illness.  Speech therapy/dietitian following.  Started on tube feeding.  Currently he is on dysphagia 3 diet.  He accidentally removed his core track.  No need to insert again.  History of HFpEF: Currently euvolemic.  Echo showed EF of 60 to 60% with normal diastolic parameters.   OSA: CPAP at bedtime.  History of pulmonary hypertension  Hypertension: On Coreg  and Entresto  as an outpatient.  Continue home Coreg  and Entresto   History of hyperlipidemia: On Crestor  as an outpatient  Tibial mass: Unclear etiology.  No abnormalities noted on x-ray  Diabetes type 2: Well-controlled.  A1c of 5.5.  Was taking Farxiga  as an outpatient.  Currently on Lantus and sliding scale.  Monitor blood sugars  Debility/deconditioning: Secondary to subdural hemorrhage.  PT /OT recommending CIR  on discharge  Morbid obesity: BMI 43.7     Nutrition Problem: Inadequate oral intake Etiology: inability to eat    DVT prophylaxis:SCDs Start: 06/02/24 2111     Code Status: Full Code  Family Communication: Wife at bedside on 10/14  Patient status:Inpatient  Patient is from :home  Anticipated discharge to: Acute inpatient rehab  Estimated DC date:whenever possible   Consultants: Neurology,neurosurgery  Procedures:as  above  Antimicrobials:  Anti-infectives (From admission, onward)    Start     Dose/Rate Route Frequency Ordered Stop   06/11/24 2200  piperacillin-tazobactam (ZOSYN) IVPB 3.375 g        3.375 g 12.5 mL/hr over 240 Minutes Intravenous Every 8 hours 06/11/24 1249 06/17/24 2159   06/10/24 1200  piperacillin-tazobactam (ZOSYN) IVPB 3.375 g  Status:  Discontinued        3.375 g 12.5 mL/hr over 240 Minutes Intravenous Every 6 hours 06/10/24 1001 06/11/24 1249   06/10/24 1045  Ampicillin-Sulbactam (UNASYN) 3 g in sodium chloride  0.9 % 100 mL IVPB  Status:  Discontinued        3 g 200 mL/hr over 30 Minutes Intravenous Every 6 hours 06/10/24 0955 06/10/24 1000       Subjective: Patient seen and examined at the bedside.  He was sitting on the chair.  Comfortable.  No new issues.  Tolerating oral diet  Objective: Vitals:   06/20/24 0001 06/20/24 0323 06/20/24 0500 06/20/24 0913  BP:  (!) 94/50 133/76 125/79  Pulse: 63 (!) 52  88  Resp:  18 13 20   Temp:  98.1 F (36.7 C)  97.7 F (36.5 C)  TempSrc:  Oral  Oral  SpO2: 91%   97%  Weight:   128.4 kg   Height:        Intake/Output Summary (Last 24 hours) at 06/20/2024 1110 Last data filed at 06/20/2024 0600 Gross per 24 hour  Intake 470 ml  Output --  Net 470 ml   Filed Weights   06/18/24 0455 06/19/24 0500 06/20/24 0500  Weight: 126.7 kg 130.6 kg 128.4 kg    Examination:  General exam: Overall comfortable, not in distress, morbidly obese HEENT: PERRL, staples on the left side of the head Respiratory system:  no wheezes or crackles  Cardiovascular system: S1 & S2 heard, RRR.  Gastrointestinal system: Abdomen is nondistended, soft and nontender. Central nervous system: Alert and oriented Extremities: No edema, no clubbing ,no cyanosis Skin: No rashes, no ulcers,no icterus     Data Reviewed: I have personally reviewed following labs and imaging studies  CBC: Recent Labs  Lab 06/14/24 0129 06/15/24 0122 06/16/24 0404  06/17/24 0607 06/18/24 0237  WBC 12.5* 13.6* 13.2* 14.4* 11.9*  HGB 13.3 12.3* 11.4* 13.0 12.3*  HCT 44.0 40.4 37.9* 41.1 38.7*  MCV 86.6 85.6 86.5 83.9 83.0  PLT 142* 113* 116* 117* 117*   Basic Metabolic Panel: Recent Labs  Lab 06/14/24 0129 06/14/24 1742 06/15/24 0122 06/15/24 1427 06/16/24 0404 06/16/24 1335 06/16/24 1922 06/17/24 0607 06/17/24 1228 06/17/24 1807 06/18/24 0237  NA 153*   < > 154*   < > 153*   < > 151* 153* 152* 149* 145  K 4.5  --  4.2  --  4.3  --   --  4.2  --   --  4.4  CL 120*  --  120*  --  119*  --   --  116*  --   --  113*  CO2 25  --  25  --  27  --   --  24  --   --  23  GLUCOSE 135*  --  163*  --  149*  --   --  152*  --   --  166*  BUN 66*  --  68*  --  52*  --   --  44*  --   --  40*  CREATININE 1.16  --  1.16  --  0.96  --   --  0.91  --   --  0.90  CALCIUM  8.3*  --  8.1*  --  7.9*  --   --  8.1*  --   --  7.9*   < > = values in this interval not displayed.     No results found for this or any previous visit (from the past 240 hours).   Radiology Studies: No results found.  Scheduled Meds:  carvedilol   6.25 mg Oral BID WC   dexamethasone  (DECADRON ) injection  4 mg Intravenous Q8H   enoxaparin  (LOVENOX ) injection  60 mg Subcutaneous Q24H   feeding supplement  237 mL Oral BID BM   Gerhardt's butt cream   Topical BID   insulin aspart  0-20 Units Subcutaneous TID WC & HS   insulin glargine  10 Units Subcutaneous Daily   levETIRAcetam  500 mg Intravenous Q12H   multivitamin with minerals  1 tablet Oral Daily   mouth rinse  15 mL Mouth Rinse QID   pantoprazole (PROTONIX) IV  40 mg Intravenous QHS   sacubitril -valsartan   1 tablet Oral BID   Continuous Infusions:     LOS: 18 days   Ivonne Mustache, MD [facility] P10/15/2025, 11:10 AM

## 2024-06-20 NOTE — Progress Notes (Signed)
 Physical Therapy Treatment Patient Details Name: Justin Rasmussen MRN: [id number] DOB: 04-Oct-1955 Today's Date: 06/20/2024   History of Present Illness 68 y.o. male history of melanoma currently in the process of getting immunotherapy presented 06/02/24 with altered mental status, aphasia, and Rt sided weakness. Initial CT scan showing a left frontal hemorrhage. MRI showed underlying mass. S/p L frontal crani for tumor resection and evacuation of L frontal hematoma & transbronchial needle aspiration 9/30. Extubated 10/1. Questionable seizure activity noted during hospitalization. Becam hypotensive with respiratory decline required BiPAP and transfer to ICU; PMH-COPD, HF, cardiomyopathy, pulmonary HTN, OSA, melanoma on immunotherapy    PT Comments  Pt received in supine and agreeable to session. Pt demonstrates improved initiation and responsiveness, but requires max A +2 for bed mobility and transfers due to weakness. Pt demonstrates improved midline sitting EOB, but requires min A for balance. Pt able to complete BLE exercise with weakness noted R>L. Pt continues to benefit from PT services to progress toward functional mobility goals.     If plan is discharge home, recommend the following: Two people to help with walking and/or transfers;Two people to help with bathing/dressing/bathroom;Assistance with cooking/housework;Assistance with feeding;Direct supervision/assist for medications management;Direct supervision/assist for financial management;Assist for transportation;Help with stairs or ramp for entrance;Supervision due to cognitive status   Can travel by private vehicle        Equipment Recommendations  Hospital bed;Hoyer lift (pending progress)    Recommendations for Other Services       Precautions / Restrictions Precautions Precautions: Fall;Other (comment) Recall of Precautions/Restrictions: Intact Precaution/Restrictions Comments: SBP < 160 Restrictions Weight Bearing Restrictions  Per Provider Order: No     Mobility  Bed Mobility Overal bed mobility: Needs Assistance Bed Mobility: Supine to Sit     Supine to sit: HOB elevated, +2 for physical assistance, +2 for safety/equipment, Max assist, Used rails     General bed mobility comments: Cues for sequencing and technique. Pt able to advance BLE some towards EOB, but ultimately max A +2 for all aspects    Transfers Overall transfer level: Needs assistance Equipment used: Ambulation equipment used Transfers: Sit to/from Stand, Bed to chair/wheelchair/BSC Sit to Stand: Max assist, +2 physical assistance           General transfer comment: STS from EOB and stedy pads with max A +2 for power up with bedpad. Cues for upright posture, but pt flexes trunk into bar during transfer to recliner. Transfer via Lift Equipment: Stedy  Ambulation/Gait                   Stairs             Wheelchair Mobility     Tilt Bed    Modified Rankin (Stroke Patients Only) Modified Rankin (Stroke Patients Only) Pre-Morbid Rankin Score: No symptoms Modified Rankin: Severe disability     Balance Overall balance assessment: Needs assistance Sitting-balance support: Bilateral upper extremity supported, Feet supported Sitting balance-Leahy Scale: Poor Sitting balance - Comments: min A sitting EOB   Standing balance support: Bilateral upper extremity supported, Reliant on assistive device for balance Standing balance-Leahy Scale: Zero Standing balance comment: Reliant on stedy and max A +2                            Communication Communication Communication: Impaired Factors Affecting Communication: Difficulty expressing self  Cognition Arousal: Alert Behavior During Therapy: WFL for tasks assessed/performed   PT - Cognitive impairments: Awareness,  Sequencing, Problem solving Difficult to assess due to: Impaired communication                     PT - Cognition Comments: Improved  responsiveness with some verbalizations Following commands: Impaired Following commands impaired: Follows one step commands with increased time    Cueing Cueing Techniques: Verbal cues, Gestural cues, Tactile cues, Visual cues  Exercises General Exercises - Lower Extremity Long Arc Quad: AROM, AAROM, Seated, Both, 5 reps    General Comments        Pertinent Vitals/Pain Pain Assessment Pain Assessment: Faces Faces Pain Scale: Hurts a little bit Pain Location: wincing with RLE ROM Pain Descriptors / Indicators: Grimacing Pain Intervention(s): Monitored during session, Repositioned     PT Goals (current goals can now be found in the care plan section) Acute Rehab PT Goals Patient Stated Goal: unable to state; wife is hoping for pt to improve PT Goal Formulation: With patient/family Time For Goal Achievement: 06/21/24 Progress towards PT goals: Progressing toward goals    Frequency    Min 2X/week      PT Plan      Co-evaluation PT/OT/SLP Co-Evaluation/Treatment: Yes Reason for Co-Treatment: Complexity of the patient's impairments (multi-system involvement);For patient/therapist safety;Necessary to address cognition/behavior during functional activity;To address functional/ADL transfers PT goals addressed during session: Balance;Strengthening/ROM;Mobility/safety with mobility        AM-PAC PT 6 Clicks Mobility   Outcome Measure  Help needed turning from your back to your side while in a flat bed without using bedrails?: Total Help needed moving from lying on your back to sitting on the side of a flat bed without using bedrails?: Total Help needed moving to and from a bed to a chair (including a wheelchair)?: Total Help needed standing up from a chair using your arms (e.g., wheelchair or bedside chair)?: Total Help needed to walk in hospital room?: Total Help needed climbing 3-5 steps with a railing? : Total 6 Click Score: 6    End of Session Equipment Utilized  During Treatment: Gait belt Activity Tolerance: Patient limited by fatigue Patient left: with call bell/phone within reach;in chair;with chair alarm set;with nursing/sitter in room Nurse Communication: Mobility status PT Visit Diagnosis: Muscle weakness (generalized) (M62.81);Difficulty in walking, not elsewhere classified (R26.2);Other symptoms and signs involving the nervous system (R29.898);Hemiplegia and hemiparesis Hemiplegia - Right/Left: Right Hemiplegia - dominant/non-dominant: Dominant Hemiplegia - caused by: Other Nontraumatic intracranial hemorrhage     Time: 0850-0913 PT Time Calculation (min) (ACUTE ONLY): 23 min  Charges:    $Therapeutic Activity: 8-22 mins PT General Charges $$ ACUTE PT VISIT: 1 Visit                     Darryle George, PTA Acute Rehabilitation Services Secure Chat Preferred  [phone]    Darryle George 06/20/2024, 9:42 AM

## 2024-06-20 NOTE — Progress Notes (Signed)
 Occupational Therapy Treatment Patient Details Name: Justin Rasmussen MRN: [id number] DOB: [birth year] Today's Date: 06/20/2024   History of present illness 68 y.o. male history of melanoma currently in the process of getting immunotherapy presented 06/02/24 with altered mental status, aphasia, and Rt sided weakness. Initial CT scan showing a left frontal hemorrhage. MRI showed underlying mass. S/p L frontal crani for tumor resection and evacuation of L frontal hematoma & transbronchial needle aspiration 9/30. Extubated 10/1. Questionable seizure activity noted during hospitalization. Becam hypotensive with respiratory decline required BiPAP and transfer to ICU; PMH-COPD, HF, cardiomyopathy, pulmonary HTN, OSA, melanoma on immunotherapy   OT comments  Patient received in supine and agreeable to OT/PT session. Patient attempting to use RUE to assist with bed mobility but required max assist of 2 to complete. Patient stood from EOB with max assist +2 and use of bed pads into Stedy for transfer to recliner with patient demonstrating trunk flexion and difficulty sitting up on pads. Patient demonstrating good active movement for hand and digits, elbow flexion/extension with gravity eliminated, and limited shoulder ROM.  Patient will benefit from intensive inpatient follow-up therapy, >3 hours/day.  Acute OT to continue to follow to address established goals to facilitate DC to next venue of care.        If plan is discharge home, recommend the following:  Two people to help with walking and/or transfers;Two people to help with bathing/dressing/bathroom;Direct supervision/assist for medications management;Direct supervision/assist for financial management;Assist for transportation;Supervision due to cognitive status;Help with stairs or ramp for entrance   Equipment Recommendations  Other (comment) (defer to next venue)    Recommendations for Other Services      Precautions / Restrictions  Precautions Precautions: Fall;Other (comment) Recall of Precautions/Restrictions: Intact Precaution/Restrictions Comments: SBP < 160 Restrictions Weight Bearing Restrictions Per Provider Order: No       Mobility Bed Mobility Overal bed mobility: Needs Assistance Bed Mobility: Supine to Sit     Supine to sit: HOB elevated, +2 for physical assistance, +2 for safety/equipment, Max assist, Used rails     General bed mobility comments: cues provided throughout with assistance with RUE and trunk    Transfers Overall transfer level: Needs assistance Equipment used: Ambulation equipment used Transfers: Sit to/from Stand, Bed to chair/wheelchair/BSC Sit to Stand: Max assist, +2 physical assistance           General transfer comment: bed pads utilized to assist with hips, patient with difficulty raising trunk Transfer via Lift Equipment: Stedy   Balance Overall balance assessment: Needs assistance Sitting-balance support: Bilateral upper extremity supported, Feet supported Sitting balance-Leahy Scale: Poor Sitting balance - Comments: min A sitting EOB   Standing balance support: Bilateral upper extremity supported, Reliant on assistive device for balance Standing balance-Leahy Scale: Zero Standing balance comment: reliant on Stedy and therapist for assistance                           ADL either performed or assessed with clinical judgement   ADL Overall ADL's : Needs assistance/impaired Eating/Feeding: Supervision/ safety   Grooming: Wash/dry face;Minimal assistance;Sitting                                      Extremity/Trunk Assessment Upper Extremity Assessment Upper Extremity Assessment: RUE deficits/detail RUE Deficits / Details: limited shoulder ROM, able to performe elbow flexion/extension wit gravity elimanated, AROM for digits and wrist  RUE Coordination: decreased fine motor;decreased gross Journalist, newspaper Communication Communication: Impaired Factors Affecting Communication: Difficulty expressing self   Cognition Arousal: Alert Behavior During Therapy: WFL for tasks assessed/performed Cognition: Cognition impaired, Difficult to assess Difficult to assess due to: Impaired communication       Attention impairment (select first level of impairment): Sustained attention Executive functioning impairment (select all impairments): Initiation, Problem solving                   Following commands: Impaired Following commands impaired: Follows one step commands with increased time      Cueing   Cueing Techniques: Verbal cues, Gestural cues, Tactile cues, Visual cues  Exercises Exercises: General Upper Extremity General Exercises - Upper Extremity Shoulder Flexion: AAROM, PROM, 5 reps, Right Elbow Flexion: AAROM, Right, 5 reps, Seated Elbow Extension: AAROM, Right, 5 reps, Seated Digit Composite Flexion: AROM, Left, 5 reps    Shoulder Instructions       General Comments      Pertinent Vitals/ Pain       Pain Assessment Pain Assessment: Faces Faces Pain Scale: Hurts a little bit Pain Location: wincing with RLE ROM Pain Descriptors / Indicators: Grimacing Pain Intervention(s): Limited activity within patient's tolerance, Monitored during session, Repositioned  Home Living                                          Prior Functioning/Environment              Frequency  Min 2X/week        Progress Toward Goals  OT Goals(current goals can now be found in the care plan section)  Progress towards OT goals: Progressing toward goals  Acute Rehab OT Goals Patient Stated Goal: none stated OT Goal Formulation: With patient Time For Goal Achievement: 06/22/24 Potential to Achieve Goals: Good ADL Goals Pt Will Perform Grooming: with contact guard assist;sitting Pt Will Perform Upper Body Dressing: with  min assist;with caregiver independent in assisting;sitting Pt Will Perform Lower Body Dressing: with mod assist;with caregiver independent in assisting;sit to/from stand Pt Will Transfer to Toilet: with mod assist;with +2 assist;stand pivot transfer;bedside commode Pt Will Perform Toileting - Clothing Manipulation and hygiene: with max assist;sit to/from stand Pt/caregiver will Perform Home Exercise Program: Right Upper extremity;With minimal assist Additional ADL Goal #1: Pt will maintain seated balance with GCA for participation in ADL for approx 3-5 min.  Plan      Co-evaluation    PT/OT/SLP Co-Evaluation/Treatment: Yes Reason for Co-Treatment: Complexity of the patient's impairments (multi-system involvement);For patient/therapist safety;Necessary to address cognition/behavior during functional activity;To address functional/ADL transfers PT goals addressed during session: Balance;Strengthening/ROM;Mobility/safety with mobility OT goals addressed during session: ADL's and self-care;Strengthening/ROM      AM-PAC OT 6 Clicks Daily Activity     Outcome Measure   Help from another person eating meals?: A Lot Help from another person taking care of personal grooming?: A Lot Help from another person toileting, which includes using toliet, bedpan, or urinal?: Total Help from another person bathing (including washing, rinsing, drying)?: A Lot Help from another person to put on and taking off regular upper body clothing?: A Lot Help from another person to put on and taking off regular lower  body clothing?: Total 6 Click Score: 10    End of Session Equipment Utilized During Treatment: Other (comment);Gait belt (Stedy)  OT Visit Diagnosis: Unsteadiness on feet (R26.81);Other abnormalities of gait and mobility (R26.89);Muscle weakness (generalized) (M62.81);Low vision, both eyes (H54.2);Other symptoms and signs involving the nervous system (R29.898);Other symptoms and signs involving  cognitive function;Cognitive communication deficit (R41.841);Hemiplegia and hemiparesis Symptoms and signs involving cognitive functions: Other Nontraumatic ICH Hemiplegia - Right/Left: Right Hemiplegia - dominant/non-dominant: Dominant Hemiplegia - caused by: Other Nontraumatic intracranial hemorrhage   Activity Tolerance Patient tolerated treatment well   Patient Left in chair;with call bell/phone within reach;with chair alarm set   Nurse Communication Mobility status;Need for lift equipment;Precautions        Time: 9149-9086 OT Time Calculation (min): 23 min  Charges: OT General Charges $OT Visit: 1 Visit OT Treatments $Therapeutic Activity: 8-22 mins  Dick Laine, OTA Acute Rehabilitation Services  Office [phone]   Jeb LITTIE Laine 06/20/2024, 11:45 AM

## 2024-06-21 DIAGNOSIS — I629 Nontraumatic intracranial hemorrhage, unspecified: Secondary | ICD-10-CM | POA: Diagnosis not present

## 2024-06-21 LAB — CBC
HCT: 42.4 % (ref 39.0–52.0)
Hemoglobin: 13.5 g/dL (ref 13.0–17.0)
MCH: 26.6 pg (ref 26.0–34.0)
MCHC: 31.8 g/dL (ref 30.0–36.0)
MCV: 83.5 fL (ref 80.0–100.0)
Platelets: 134 K/uL — ABNORMAL LOW (ref 150–400)
RBC: 5.08 MIL/uL (ref 4.22–5.81)
RDW: 16.4 % — ABNORMAL HIGH (ref 11.5–15.5)
WBC: 19.3 K/uL — ABNORMAL HIGH (ref 4.0–10.5)
nRBC: 0 % (ref 0.0–0.2)

## 2024-06-21 LAB — GLUCOSE, CAPILLARY
Glucose-Capillary: 169 mg/dL — ABNORMAL HIGH (ref 70–99)
Glucose-Capillary: 169 mg/dL — ABNORMAL HIGH (ref 70–99)
Glucose-Capillary: 140 mg/dL — ABNORMAL HIGH (ref 70–99)
Glucose-Capillary: 176 mg/dL — ABNORMAL HIGH (ref 70–99)
Glucose-Capillary: 157 mg/dL — ABNORMAL HIGH (ref 70–99)

## 2024-06-21 LAB — BASIC METABOLIC PANEL WITH GFR
Anion gap: 10 (ref 5–15)
BUN: 55 mg/dL — ABNORMAL HIGH (ref 8–23)
CO2: 18 mmol/L — ABNORMAL LOW (ref 22–32)
Calcium: 7.9 mg/dL — ABNORMAL LOW (ref 8.9–10.3)
Chloride: 109 mmol/L (ref 98–111)
Creatinine, Ser: 1.24 mg/dL (ref 0.61–1.24)
GFR, Estimated: >60 mL/min (ref >60)
Glucose, Bld: 192 mg/dL — ABNORMAL HIGH (ref 70–99)
Potassium: 4.5 mmol/L (ref 3.5–5.1)
Sodium: 137 mmol/L (ref 135–145)

## 2024-06-21 MED ORDER — DEXAMETHASONE 4 MG PO TABS
4.0000 mg | ORAL_TABLET | Freq: Two times a day (BID) | ORAL | Status: DC
  Administered 2024-06-21 – 2024-06-22 (×3): 4 mg via ORAL
  Filled 2024-06-21 (×3): qty 1

## 2024-06-21 MED ORDER — LEVETIRACETAM 500 MG PO TABS
500.0000 mg | ORAL_TABLET | Freq: Two times a day (BID) | ORAL | Status: AC
Start: 2024-06-21 — End: ?
  Administered 2024-06-21 – 2024-06-22 (×2): 500 mg via ORAL
  Filled 2024-06-21 (×2): qty 1

## 2024-06-21 MED ORDER — PANTOPRAZOLE SODIUM 40 MG PO TBEC
40.0000 mg | DELAYED_RELEASE_TABLET | Freq: Every day | ORAL | Status: DC
  Administered 2024-06-21 – 2024-06-22 (×2): 40 mg via ORAL
  Filled 2024-06-21 (×2): qty 1

## 2024-06-21 NOTE — Plan of Care (Signed)
 Progressing

## 2024-06-21 NOTE — Progress Notes (Signed)
 Speech Language Pathology Treatment: Dysphagia;Cognitive-Linguistic  Patient Details Name: Justin Rasmussen MRN: [id number] DOB: 24-May-1956 Today's Date: 06/21/2024 Time: 8984-8968 SLP Time Calculation (min) (ACUTE ONLY): 16 min  Assessment / Plan / Recommendation Clinical Impression  Pt exhibits difficulty with initiation and sequencing. He was ~90% accurate during a visual discrimination task, identifying a target in a field of four. He consistently followed single step commands but could not branch up once complexity increased given Mod multimodal cueing. Verbal output is limited and responses are characterized by paraphasias and perseveration. He effectively identified today's date once presented with a calendar and Mod cueing. Pt also needs cues for these targets when eating/drinking, though he achieves complete oral clearance without s/s of aspiration. Discussed with his wife and will f/u. Continue current diet for now to facilitate self-feeding.    HPI HPI: 68 y.o. male history of melanoma currently in the process of getting immunotherapy presented 06/02/24 with altered mental status, aphasia, and Rt sided weakness. Initial CT scan showing a left frontal hemorrhage. MRI showed underlying mass. S/p L frontal crani for tumor resection and evacuation of L frontal hematoma & transbronchial needle aspiration 9/30. Extubated 10/1. Questionable seizure activity noted during hospitalization. Became hypotensive and transferred to ICU on BiPAP 10/5. Repeat CTH stable. PMH-COPD, HF, cardiomyopathy, pulmonary HTN, OSA, melanoma on immunotherapy      SLP Plan  Continue with current plan of care          Recommendations  Diet recommendations: Dysphagia 3 (mechanical soft);Thin liquid Liquids provided via: Cup;Straw Medication Administration: Whole meds with puree Supervision: Staff to assist with self feeding;Full supervision/cueing for compensatory strategies Compensations: Minimize environmental  distractions;Small sips/bites;Slow rate Postural Changes and/or Swallow Maneuvers: Seated upright 90 degrees                  Oral care BID   Frequent or constant Supervision/Assistance Dysphagia, unspecified (R13.10);Aphasia (R47.01);Cognitive communication deficit (M58.158)     Continue with current plan of care     Damien Blumenthal, M.A., CCC-SLP Speech Language Pathology, Acute Rehabilitation Services  Secure Chat preferred [phone]   06/21/2024, 10:47 AM

## 2024-06-21 NOTE — Progress Notes (Signed)
 PROGRESS NOTE  Sanad Fearnow  FMW:[id number] DOB: 15-Jul-1956 DOA: 06/02/2024 PCP: Colette Torrence GRADE, MD   Brief Narrative: Patient is a 68 year old male with history of pulm hypertension, hyperlipidemia, OSA on CPAP, HFpEF, COPD, melanoma presented with right-sided weakness and aphasia.  Imaging showed left frontal lobe ICH secondary to hemorrhagic metastatic mass with associated worsening edema.  Patient required ICU admission.  Neurosurgery was consulted and he underwent crani with hematoma evacuation and tumor resection.  Hospital course remarkable for dysphagia requiring feeding tube placement, respiratory failure requiring intubation, septic shock due to aspiration pneumonia.  Currently hemodynamically stable.  Tolerating dysphagia 3 diet.  PT/OT recommend acute inpatient rehab on discharge.  Medically stable for discharge whenever possible.  TOC following  Assessment & Plan:  Principal Problem:   Intracranial hemorrhage (HCC) Active Problems:   Morbid obesity (HCC)   Elevated hemidiaphragm-Rt   OSA (obstructive sleep apnea)   Melanoma metastatic to brain (HCC)   Dysphagia   Hypernatremia   HTN (hypertension)   Chronic diastolic CHF (congestive heart failure) (HCC)   AKI (acute kidney injury)   COPD (chronic obstructive pulmonary disease) (HCC)   Prediabetes   Acute left frontal lobe intracranial hemorrhage: Presented with right-sided weakness, aphasia.  CT head showed hemorrhage in the anterior left frontal lobe measuring 4.3 X6.2X 4.2 cm, worsening edema with mass effect and 9 mm midline shift.MRI brain (9/28) significant for a hemorrhagic tumor in the anterior left frontal lobe, with a 2.5 cm enhancing tumor and increased hematoma. CTA head and neck (9/27) significant for no large vessel occlusion. LDL of 93. Hemoglobin A1C of 5.5%. Transthoracic Echocardiogram significant for an LVEF of 60-65% and no atrial level shunt . Neurosurgery consulted.  Started on Decadron .  Underwent  creatinine with hematoma evacuation vertebral dissection on 9/30.  Neurosurgery recommended long steroid taper.Continue  on keppra.  Decadron  reduced to 4 mg every 12 hours from 8 hours, gradually taper  Metastatic melanoma: Under treatment with immunotherapy as an outpatient.  We recommend to follow-up with oncology as an outpatient.  Leukocytosis: This is most likely from the steroids.    History of COPD: Currently not on exertion.  Continue bronchodilators as needed  Septic shock: Presumed to be from aspiration pneumonia.  Required vasopressor support and was given IV antibiotics.  Vasopressors have been weaned off.  Currently blood pressure stable.  Completed antibiotics course  Dysphagia: Likely related to ICH, acute illness.  Speech therapy/dietitian following.  Started on tube feeding.  Currently he is on dysphagia 3 diet.  He accidentally removed his core track.  No need to insert again.  History of HFpEF: Currently euvolemic.  Echo showed EF of 60 to 60% with normal diastolic parameters.   OSA: CPAP at bedtime.  History of pulmonary hypertension  Hypertension: On Coreg  and Entresto  as an outpatient.  Continue home Coreg  and Entresto   History of hyperlipidemia: On Crestor  as an outpatient  Tibial mass: Unclear etiology.  No abnormalities noted on x-ray  Diabetes type 2: Well-controlled.  A1c of 5.5.  Was taking Farxiga  as an outpatient.  Currently on Lantus and sliding scale.  Monitor blood sugars  Debility/deconditioning: Secondary to subdural hemorrhage.  PT /OT recommending AIR  on discharge  Morbid obesity: BMI 43.7     Nutrition Problem: Inadequate oral intake Etiology: inability to eat    DVT prophylaxis:SCDs Start: 06/02/24 2111     Code Status: Full Code  Family Communication: Wife at bedside on 10/14  Patient status:Inpatient  Patient is from :home  Anticipated discharge to: Acute inpatient rehab  Estimated DC date:whenever possible   Consultants:  Neurology,neurosurgery  Procedures:as above  Antimicrobials:  Anti-infectives (From admission, onward)    Start     Dose/Rate Route Frequency Ordered Stop   06/11/24 2200  piperacillin-tazobactam (ZOSYN) IVPB 3.375 g        3.375 g 12.5 mL/hr over 240 Minutes Intravenous Every 8 hours 06/11/24 1249 06/17/24 2159   06/10/24 1200  piperacillin-tazobactam (ZOSYN) IVPB 3.375 g  Status:  Discontinued        3.375 g 12.5 mL/hr over 240 Minutes Intravenous Every 6 hours 06/10/24 1001 06/11/24 1249   06/10/24 1045  Ampicillin-Sulbactam (UNASYN) 3 g in sodium chloride  0.9 % 100 mL IVPB  Status:  Discontinued        3 g 200 mL/hr over 30 Minutes Intravenous Every 6 hours 06/10/24 0955 06/10/24 1000       Subjective: Patient seen and examined at the bedside today.  Comfortable lying on bed.  No active issues.  Waiting for acute inpatient rehab bed.  Not in any Distress. Tolerating oral diet  Objective: Vitals:   06/20/24 2306 06/21/24 0013 06/21/24 0353 06/21/24 0725  BP: 100/68  97/62 107/65  Pulse: 78  82 71  Resp: 18  18 16   Temp: 98.9 F (37.2 C)  98 F (36.7 C) 97.8 F (36.6 C)  TempSrc: Oral  Oral Oral  SpO2: 95% 96% 95% 97%  Weight:      Height:        Intake/Output Summary (Last 24 hours) at 06/21/2024 1052 Last data filed at 06/21/2024 9367 Gross per 24 hour  Intake 480 ml  Output 1000 ml  Net -520 ml   Filed Weights   06/18/24 0455 06/19/24 0500 06/20/24 0500  Weight: 126.7 kg 130.6 kg 128.4 kg    Examination:   General exam: Overall comfortable, not in distress, morbidly obese HEENT: PERRL, staples on the left side of the head Respiratory system:  no wheezes or crackles  Cardiovascular system: S1 & S2 heard, RRR.  Gastrointestinal system: Abdomen is nondistended, soft and nontender. Central nervous system: Alert and oriented Extremities: No edema, no clubbing ,no cyanosis Skin: No rashes, no ulcers,no icterus     Data Reviewed: I have personally  reviewed following labs and imaging studies  CBC: Recent Labs  Lab 06/15/24 0122 06/16/24 0404 06/17/24 0607 06/18/24 0237 06/21/24 0145  WBC 13.6* 13.2* 14.4* 11.9* 19.3*  HGB 12.3* 11.4* 13.0 12.3* 13.5  HCT 40.4 37.9* 41.1 38.7* 42.4  MCV 85.6 86.5 83.9 83.0 83.5  PLT 113* 116* 117* 117* 134*   Basic Metabolic Panel: Recent Labs  Lab 06/15/24 0122 06/15/24 1427 06/16/24 0404 06/16/24 1335 06/17/24 0607 06/17/24 1228 06/17/24 1807 06/18/24 0237 06/21/24 0145  NA 154*   < > 153*   < > 153* 152* 149* 145 137  K 4.2  --  4.3  --  4.2  --   --  4.4 4.5  CL 120*  --  119*  --  116*  --   --  113* 109  CO2 25  --  27  --  24  --   --  23 18*  GLUCOSE 163*  --  149*  --  152*  --   --  166* 192*  BUN 68*  --  52*  --  44*  --   --  40* 55*  CREATININE 1.16  --  0.96  --  0.91  --   --  0.90 1.24  CALCIUM  8.1*  --  7.9*  --  8.1*  --   --  7.9* 7.9*   < > = values in this interval not displayed.     No results found for this or any previous visit (from the past 240 hours).   Radiology Studies: No results found.  Scheduled Meds:  carvedilol   6.25 mg Oral BID WC   dexamethasone  (DECADRON ) injection  4 mg Intravenous Q8H   enoxaparin  (LOVENOX ) injection  60 mg Subcutaneous Q24H   feeding supplement  237 mL Oral BID BM   Gerhardt's butt cream   Topical BID   insulin aspart  0-20 Units Subcutaneous TID WC & HS   insulin glargine-yfgn  10 Units Subcutaneous Daily   levETIRAcetam  500 mg Intravenous Q12H   multivitamin with minerals  1 tablet Oral Daily   mouth rinse  15 mL Mouth Rinse QID   pantoprazole (PROTONIX) IV  40 mg Intravenous QHS   sacubitril -valsartan   1 tablet Oral BID   Continuous Infusions:     LOS: 19 days   Ivonne Mustache, MD [facility] P10/16/2025, 10:52 AM

## 2024-06-21 NOTE — Progress Notes (Signed)
 Planning for to tomorrows transfer patient's wife took patients belongings home 10/16.

## 2024-06-21 NOTE — Progress Notes (Signed)
 PHARMACIST - PHYSICIAN COMMUNICATION  DR:   Jillian  CONCERNING: IV to Oral Route Change Policy  RECOMMENDATION: This patient is receiving protonix/keppra by the intravenous route.  Based on criteria approved by the Pharmacy and Therapeutics Committee, the intravenous medication(s) is/are being converted to the equivalent oral dose form(s).   DESCRIPTION: These criteria include: The patient is eating (either orally or via tube) and/or has been taking other orally administered medications for a least 24 hours The patient has no evidence of active gastrointestinal bleeding or impaired GI absorption (gastrectomy, short bowel, patient on TNA or NPO).  If you have questions about this conversion, please contact the Pharmacy Department  []   [phone] )  [facility] []   [phone] )  [facility] [x]   [phone] )  [facility] []   [phone] )  [facility] []   [phone] )  [facility]

## 2024-06-21 NOTE — Progress Notes (Signed)
   06/21/24 2110  BiPAP/CPAP/SIPAP  BiPAP/CPAP/SIPAP Pt Type Adult  Reason BIPAP/CPAP not in use Non-compliant  BiPAP/CPAP /SiPAP Vitals  Pulse Rate 71  Resp 18  SpO2 99 %  Bilateral Breath Sounds Diminished

## 2024-06-22 DIAGNOSIS — I69120 Aphasia following nontraumatic intracerebral hemorrhage: Secondary | ICD-10-CM | POA: Diagnosis not present

## 2024-06-22 DIAGNOSIS — I619 Nontraumatic intracerebral hemorrhage, unspecified: Secondary | ICD-10-CM | POA: Diagnosis not present

## 2024-06-22 DIAGNOSIS — I5030 Unspecified diastolic (congestive) heart failure: Secondary | ICD-10-CM | POA: Diagnosis not present

## 2024-06-22 DIAGNOSIS — Z9981 Dependence on supplemental oxygen: Secondary | ICD-10-CM | POA: Diagnosis not present

## 2024-06-22 DIAGNOSIS — R1311 Dysphagia, oral phase: Secondary | ICD-10-CM | POA: Diagnosis not present

## 2024-06-22 DIAGNOSIS — E876 Hypokalemia: Secondary | ICD-10-CM | POA: Diagnosis not present

## 2024-06-22 DIAGNOSIS — J449 Chronic obstructive pulmonary disease, unspecified: Secondary | ICD-10-CM | POA: Diagnosis not present

## 2024-06-22 DIAGNOSIS — C439 Malignant melanoma of skin, unspecified: Secondary | ICD-10-CM | POA: Diagnosis not present

## 2024-06-22 DIAGNOSIS — Z51A Encounter for sepsis aftercare: Secondary | ICD-10-CM | POA: Diagnosis not present

## 2024-06-22 DIAGNOSIS — R2689 Other abnormalities of gait and mobility: Secondary | ICD-10-CM | POA: Diagnosis not present

## 2024-06-22 DIAGNOSIS — G4733 Obstructive sleep apnea (adult) (pediatric): Secondary | ICD-10-CM | POA: Diagnosis not present

## 2024-06-22 DIAGNOSIS — I509 Heart failure, unspecified: Secondary | ICD-10-CM | POA: Diagnosis not present

## 2024-06-22 DIAGNOSIS — I611 Nontraumatic intracerebral hemorrhage in hemisphere, cortical: Secondary | ICD-10-CM | POA: Diagnosis not present

## 2024-06-22 DIAGNOSIS — I69151 Hemiplegia and hemiparesis following nontraumatic intracerebral hemorrhage affecting right dominant side: Secondary | ICD-10-CM | POA: Diagnosis not present

## 2024-06-22 DIAGNOSIS — M6281 Muscle weakness (generalized): Secondary | ICD-10-CM | POA: Diagnosis not present

## 2024-06-22 DIAGNOSIS — I272 Pulmonary hypertension, unspecified: Secondary | ICD-10-CM | POA: Diagnosis not present

## 2024-06-22 DIAGNOSIS — E785 Hyperlipidemia, unspecified: Secondary | ICD-10-CM | POA: Diagnosis not present

## 2024-06-22 DIAGNOSIS — Z6841 Body Mass Index (BMI) 40.0 and over, adult: Secondary | ICD-10-CM | POA: Diagnosis not present

## 2024-06-22 DIAGNOSIS — I110 Hypertensive heart disease with heart failure: Secondary | ICD-10-CM | POA: Diagnosis not present

## 2024-06-22 DIAGNOSIS — G4730 Sleep apnea, unspecified: Secondary | ICD-10-CM | POA: Diagnosis not present

## 2024-06-22 DIAGNOSIS — R031 Nonspecific low blood-pressure reading: Secondary | ICD-10-CM | POA: Diagnosis not present

## 2024-06-22 DIAGNOSIS — R600 Localized edema: Secondary | ICD-10-CM | POA: Diagnosis not present

## 2024-06-22 DIAGNOSIS — Z794 Long term (current) use of insulin: Secondary | ICD-10-CM | POA: Diagnosis not present

## 2024-06-22 DIAGNOSIS — I69191 Dysphagia following nontraumatic intracerebral hemorrhage: Secondary | ICD-10-CM | POA: Diagnosis not present

## 2024-06-22 DIAGNOSIS — Z8701 Personal history of pneumonia (recurrent): Secondary | ICD-10-CM | POA: Diagnosis not present

## 2024-06-22 DIAGNOSIS — C4371 Malignant melanoma of right lower limb, including hip: Secondary | ICD-10-CM | POA: Diagnosis not present

## 2024-06-22 DIAGNOSIS — I10 Essential (primary) hypertension: Secondary | ICD-10-CM | POA: Diagnosis not present

## 2024-06-22 DIAGNOSIS — E119 Type 2 diabetes mellitus without complications: Secondary | ICD-10-CM | POA: Diagnosis not present

## 2024-06-22 DIAGNOSIS — I629 Nontraumatic intracranial hemorrhage, unspecified: Secondary | ICD-10-CM | POA: Diagnosis not present

## 2024-06-22 DIAGNOSIS — R2241 Localized swelling, mass and lump, right lower limb: Secondary | ICD-10-CM | POA: Diagnosis not present

## 2024-06-22 LAB — GLUCOSE, CAPILLARY: Glucose-Capillary: 144 mg/dL — ABNORMAL HIGH (ref 70–99)

## 2024-06-22 LAB — CBC
HCT: 43.1 % (ref 39.0–52.0)
Hemoglobin: 14.1 g/dL (ref 13.0–17.0)
MCH: 26.5 pg (ref 26.0–34.0)
MCHC: 32.7 g/dL (ref 30.0–36.0)
MCV: 81 fL (ref 80.0–100.0)
Platelets: 150 K/uL (ref 150–400)
RBC: 5.32 MIL/uL (ref 4.22–5.81)
RDW: 17.2 % — ABNORMAL HIGH (ref 11.5–15.5)
WBC: 20.3 K/uL — ABNORMAL HIGH (ref 4.0–10.5)
nRBC: 0 % (ref 0.0–0.2)

## 2024-06-22 MED ORDER — LEVETIRACETAM 500 MG PO TABS: 500.0000 mg | ORAL_TABLET | Freq: Two times a day (BID) | ORAL | Status: DC

## 2024-06-22 MED ORDER — ROSUVASTATIN CALCIUM 20 MG PO TABS: 20.0000 mg | ORAL_TABLET | Freq: Every day | ORAL | Status: DC

## 2024-06-22 MED ORDER — DEXAMETHASONE 2 MG PO TABS: 2.0000 mg | ORAL_TABLET | Freq: Two times a day (BID) | ORAL | Status: DC

## 2024-06-22 MED ORDER — POLYETHYLENE GLYCOL 3350 17 G PO PACK: 17.0000 g | PACK | Freq: Every day | ORAL | Status: DC | PRN

## 2024-06-22 MED ORDER — INSULIN ASPART 100 UNIT/ML IJ SOLN: 0.0000 [IU] | Freq: Three times a day (TID) | INTRAMUSCULAR | Status: DC

## 2024-06-22 NOTE — Plan of Care (Signed)
  Problem: Education: Goal: Knowledge of disease or condition will improve Outcome: Progressing   Problem: Intracerebral Hemorrhage Tissue Perfusion: Goal: Complications of Intracerebral Hemorrhage will be minimized Outcome: Progressing   Problem: Self-Care: Goal: Ability to participate in self-care as condition permits will improve Outcome: Progressing

## 2024-06-22 NOTE — Plan of Care (Signed)
 Problem: Education: Goal: Knowledge of disease or condition will improve Outcome: Progressing Goal: Knowledge of secondary prevention will improve (MUST DOCUMENT ALL) Outcome: Progressing Goal: Knowledge of patient specific risk factors will improve (DELETE if not current risk factor) Outcome: Progressing   Problem: Intracerebral Hemorrhage Tissue Perfusion: Goal: Complications of Intracerebral Hemorrhage will be minimized Outcome: Progressing   Problem: Coping: Goal: Will verbalize positive feelings about self Outcome: Progressing Goal: Will identify appropriate support needs Outcome: Progressing   Problem: Health Behavior/Discharge Planning: Goal: Ability to manage health-related needs will improve Outcome: Progressing Goal: Goals will be collaboratively established with patient/family Outcome: Progressing   Problem: Self-Care: Goal: Ability to participate in self-care as condition permits will improve Outcome: Progressing Goal: Verbalization of feelings and concerns over difficulty with self-care will improve Outcome: Progressing Goal: Ability to communicate needs accurately will improve Outcome: Progressing   Problem: Nutrition: Goal: Risk of aspiration will decrease Outcome: Progressing Goal: Dietary intake will improve Outcome: Progressing   Problem: Education: Goal: Knowledge of General Education information will improve Description: Including pain rating scale, medication(s)/side effects and non-pharmacologic comfort measures Outcome: Progressing   Problem: Health Behavior/Discharge Planning: Goal: Ability to manage health-related needs will improve Outcome: Progressing   Problem: Clinical Measurements: Goal: Ability to maintain clinical measurements within normal limits will improve Outcome: Progressing Goal: Will remain free from infection Outcome: Progressing Goal: Diagnostic test results will improve Outcome: Progressing Goal: Respiratory  complications will improve Outcome: Progressing Goal: Cardiovascular complication will be avoided Outcome: Progressing   Problem: Activity: Goal: Risk for activity intolerance will decrease Outcome: Progressing   Problem: Nutrition: Goal: Adequate nutrition will be maintained Outcome: Progressing   Problem: Coping: Goal: Level of anxiety will decrease Outcome: Progressing   Problem: Elimination: Goal: Will not experience complications related to bowel motility Outcome: Progressing Goal: Will not experience complications related to urinary retention Outcome: Progressing   Problem: Pain Managment: Goal: General experience of comfort will improve and/or be controlled Outcome: Progressing   Problem: Safety: Goal: Ability to remain free from injury will improve Outcome: Progressing   Problem: Skin Integrity: Goal: Risk for impaired skin integrity will decrease Outcome: Progressing   Problem: Education: Goal: Ability to describe self-care measures that may prevent or decrease complications (Diabetes Survival Skills Education) will improve Outcome: Progressing Goal: Individualized Educational Video(s) Outcome: Progressing   Problem: Coping: Goal: Ability to adjust to condition or change in health will improve Outcome: Progressing   Problem: Fluid Volume: Goal: Ability to maintain a balanced intake and output will improve Outcome: Progressing   Problem: Health Behavior/Discharge Planning: Goal: Ability to identify and utilize available resources and services will improve Outcome: Progressing Goal: Ability to manage health-related needs will improve Outcome: Progressing   Problem: Metabolic: Goal: Ability to maintain appropriate glucose levels will improve Outcome: Progressing   Problem: Nutritional: Goal: Maintenance of adequate nutrition will improve Outcome: Progressing Goal: Progress toward achieving an optimal weight will improve Outcome: Progressing    Problem: Skin Integrity: Goal: Risk for impaired skin integrity will decrease Outcome: Progressing   Problem: Tissue Perfusion: Goal: Adequacy of tissue perfusion will improve Outcome: Progressing   Problem: Activity: Goal: Ability to tolerate increased activity will improve Outcome: Progressing   Problem: Respiratory: Goal: Ability to maintain a clear airway and adequate ventilation will improve Outcome: Progressing   Problem: Role Relationship: Goal: Method of communication will improve Outcome: Progressing   Problem: Education: Goal: Knowledge of the prescribed therapeutic regimen will improve Outcome: Progressing   Problem: Clinical Measurements: Goal: Usual level of  consciousness will be regained or maintained. Outcome: Progressing Goal: Neurologic status will improve Outcome: Progressing Goal: Ability to maintain intracranial pressure will improve Outcome: Progressing

## 2024-06-22 NOTE — TOC Progression Note (Signed)
 Transition of Care [facility]) - Progression Note    Patient Details  Name: Justin Rasmussen MRN: [id number] Date of Birth: Feb 01, 1956  Transition of Care [facility]) CM/SW Contact  [name], [place] Phone Number: 06/22/2024, 11:14 AM  Clinical Narrative:   CSW met with patient's spouse, Justin Rasmussen, to discuss that CIR does not feel that patient can tolerate intensity, and provide other options. Spouse in agreement with sending referral out to other inpatient rehabs for them to review. CSW to send out referral, will follow.    Expected Discharge Plan: IP Rehab Facility Barriers to Discharge: Insurance Authorization               Expected Discharge Plan and Services   Discharge Planning Services: CM Consult   Living arrangements for the past 2 months: Single Family Home Expected Discharge Date: 06/22/24                                     Social Drivers of Health (SDOH) Interventions SDOH Screenings   Food Insecurity: No Food Insecurity (06/04/2024)  Housing: Low Risk  (06/04/2024)  Transportation Needs: No Transportation Needs (06/04/2024)  Utilities: Not At Risk (06/04/2024)  Depression (PHQ2-9): Low Risk  (07/12/2023)  Physical Activity: Unknown (06/01/2019)  Social Connections: Moderately Isolated (06/13/2024)  Stress: No Stress Concern Present (06/01/2019)  Tobacco Use: Medium Risk (06/05/2024)    Readmission Risk Interventions     No data to display

## 2024-06-22 NOTE — Progress Notes (Signed)
 Physical Therapy Treatment Patient Details Name: Justin Rasmussen MRN: [id number] DOB: August 22, 1956 Today's Date: 06/22/2024   History of Present Illness 68 y.o. male history of melanoma currently in the process of getting immunotherapy presented 06/02/24 with altered mental status, aphasia, and Rt sided weakness. Initial CT scan showing a left frontal hemorrhage. MRI showed underlying mass. S/p L frontal crani for tumor resection and evacuation of L frontal hematoma & transbronchial needle aspiration 9/30. Extubated 10/1. Questionable seizure activity noted during hospitalization. Becam hypotensive with respiratory decline required BiPAP and transfer to ICU; PMH-COPD, HF, cardiomyopathy, pulmonary HTN, OSA, melanoma on immunotherapy    PT Comments  Pt received in supine and agreeable to session. Pt continues to require max A+2 for bed mobility and transfers with max cues for sequencing and initiation. During BLE exercises, pt able to complete 1-2 reps, however then demonstrates decreased initiation requiring increased assist. Pt demonstrates quick fatigue when sitting on stedy pads with increased trunk flexion requiring assist for upright posture. Pt continues to benefit from PT services to progress toward functional mobility goals.     If plan is discharge home, recommend the following: Two people to help with walking and/or transfers;Two people to help with bathing/dressing/bathroom;Assistance with cooking/housework;Assistance with feeding;Direct supervision/assist for medications management;Direct supervision/assist for financial management;Assist for transportation;Help with stairs or ramp for entrance;Supervision due to cognitive status   Can travel by private vehicle        Equipment Recommendations  Hospital bed;Hoyer lift (pending progress)    Recommendations for Other Services       Precautions / Restrictions Precautions Precautions: Fall;Other (comment) Recall of  Precautions/Restrictions: Intact Precaution/Restrictions Comments: SBP < 160 Restrictions Weight Bearing Restrictions Per Provider Order: No     Mobility  Bed Mobility Overal bed mobility: Needs Assistance Bed Mobility: Supine to Sit     Supine to sit: HOB elevated, +2 for physical assistance, +2 for safety/equipment, Max assist, Used rails     General bed mobility comments: Max cues for initiation and sequencing and max A +2 for all aspects    Transfers Overall transfer level: Needs assistance Equipment used: Ambulation equipment used Transfers: Sit to/from Stand, Bed to chair/wheelchair/BSC Sit to Stand: Max assist, +2 physical assistance           General transfer comment: max A +2 with bed pads for power up Transfer via Lift Equipment: Stedy  Ambulation/Gait                   Stairs             Wheelchair Mobility     Tilt Bed    Modified Rankin (Stroke Patients Only)       Balance Overall balance assessment: Needs assistance Sitting-balance support: Bilateral upper extremity supported, Feet supported Sitting balance-Leahy Scale: Poor Sitting balance - Comments: min A-CGA sitting EOB   Standing balance support: Bilateral upper extremity supported, Reliant on assistive device for balance Standing balance-Leahy Scale: Zero Standing balance comment: reliant on Stedy and therapist for assistance                            Communication Communication Communication: Impaired Factors Affecting Communication: Difficulty expressing self;Reduced clarity of speech (low volume)  Cognition Arousal: Alert Behavior During Therapy: WFL for tasks assessed/performed   PT - Cognitive impairments: Awareness, Sequencing, Problem solving, Initiation  PT - Cognition Comments: Pt initiated 1-2 reps of an exercise with increased time and cues, but then would not initiate any further reps Following commands:  Impaired Following commands impaired: Follows one step commands with increased time, Follows one step commands inconsistently    Cueing Cueing Techniques: Verbal cues, Gestural cues, Tactile cues, Visual cues  Exercises General Exercises - Lower Extremity Ankle Circles/Pumps: AROM, Seated, Both, 5 reps Heel Slides: AAROM, Both, 5 reps    General Comments        Pertinent Vitals/Pain Pain Assessment Pain Assessment: Faces Faces Pain Scale: Hurts a little bit Pain Location: generalized with mobility Pain Descriptors / Indicators: Grimacing Pain Intervention(s): Monitored during session     PT Goals (current goals can now be found in the care plan section) Acute Rehab PT Goals Patient Stated Goal: unable to state; wife is hoping for pt to improve PT Goal Formulation: With patient/family Time For Goal Achievement: 06/21/24 Progress towards PT goals: Progressing toward goals    Frequency    Min 2X/week      PT Plan      Co-evaluation PT/OT/SLP Co-Evaluation/Treatment: Yes Reason for Co-Treatment: Complexity of the patient's impairments (multi-system involvement);For patient/therapist safety;Necessary to address cognition/behavior during functional activity;To address functional/ADL transfers PT goals addressed during session: Balance;Strengthening/ROM;Mobility/safety with mobility        AM-PAC PT 6 Clicks Mobility   Outcome Measure  Help needed turning from your back to your side while in a flat bed without using bedrails?: Total Help needed moving from lying on your back to sitting on the side of a flat bed without using bedrails?: Total Help needed moving to and from a bed to a chair (including a wheelchair)?: Total Help needed standing up from a chair using your arms (e.g., wheelchair or bedside chair)?: Total Help needed to walk in hospital room?: Total Help needed climbing 3-5 steps with a railing? : Total 6 Click Score: 6    End of Session Equipment  Utilized During Treatment: Gait belt Activity Tolerance: Patient limited by fatigue Patient left: with call bell/phone within reach;in chair;with chair alarm set Nurse Communication: Mobility status PT Visit Diagnosis: Muscle weakness (generalized) (M62.81);Difficulty in walking, not elsewhere classified (R26.2);Other symptoms and signs involving the nervous system (R29.898);Hemiplegia and hemiparesis Hemiplegia - Right/Left: Right Hemiplegia - dominant/non-dominant: Dominant Hemiplegia - caused by: Other Nontraumatic intracranial hemorrhage     Time: 0921-0950 PT Time Calculation (min) (ACUTE ONLY): 29 min  Charges:    $Therapeutic Activity: 8-22 mins PT General Charges $$ ACUTE PT VISIT: 1 Visit                     Darryle George, PTA Acute Rehabilitation Services Secure Chat Preferred  [phone]    Darryle George 06/22/2024, 11:31 AM

## 2024-06-22 NOTE — TOC Progression Note (Signed)
 Transition of Care [facility]) - Progression Note    Patient Details  Name: Justin Rasmussen MRN: [id number] Date of Birth: 04/19/1956  [facility]) CM/SW Contact  [name], [place] Phone Number: 06/22/2024, 11:17 AM  Clinical Narrative:   CSW updated after hours that [facility] does not believe the patient can tolerate the intensity, unable to offer a bed. CSW provided update to spouse, she is in agreement with [facility]. CSW updated [facility], they initiated insurance authorization and patient is approved. Plan to admit to [facility] tomorrow morning. CSW updated MD. CSW to follow.    Expected Discharge Plan: IP Rehab Facility Barriers to Discharge: Insurance Authorization               Expected Discharge Plan and Services   Discharge Planning Services: CM Consult   Living arrangements for the past 2 months: Single Family Home Expected Discharge Date: 06/22/24                                     Social Drivers of Health (SDOH) Interventions SDOH Screenings   Food Insecurity: No Food Insecurity (06/04/2024)  Housing: Low Risk  (06/04/2024)  Transportation Needs: No Transportation Needs (06/04/2024)  Utilities: Not At Risk (06/04/2024)  Depression (PHQ2-9): Low Risk  (07/12/2023)  Physical Activity: Unknown (06/01/2019)  Social Connections: Moderately Isolated (06/13/2024)  Stress: No Stress Concern Present (06/01/2019)  Tobacco Use: Medium Risk (06/05/2024)    Readmission Risk Interventions     No data to display

## 2024-06-22 NOTE — TOC Transition Note (Signed)
 Transition of Care [facility]) - Discharge Note   Patient Details  Name: Justin Rasmussen MRN: [id number] Date of Birth: 09/23/1955  Transition of [facility]) CM/SW Contact:  [name], LCSW Phone Number: 06/22/2024, 11:18 AM   Clinical Narrative:   CSW confirmed with spouse and [facility] that patient will be discharging today, patient is medically stable. Discharge information sent to [facility], bed is available. Transport arranged with [facility] for next available.  Nurse to call report to [phone].    Final next level of care: IP Rehab Facility Barriers to Discharge: Barriers Resolved   Patient Goals and CMS Choice            Discharge Placement              Patient chooses bed at:  ([facility]) Patient to be transferred to facility by: [facility] Name of family member notified: Darice Patient and family notified of of transfer: 06/22/24  Discharge Plan and Services Additional resources added to the After Visit Summary for     Discharge Planning Services: CM Consult                                 Social Drivers of Health (SDOH) Interventions SDOH Screenings   Food Insecurity: No Food Insecurity (06/04/2024)  Housing: Low Risk  (06/04/2024)  Transportation Needs: No Transportation Needs (06/04/2024)  Utilities: Not At Risk (06/04/2024)  Depression (PHQ2-9): Low Risk  (07/12/2023)  Physical Activity: Unknown (06/01/2019)  Social Connections: Moderately Isolated (06/13/2024)  Stress: No Stress Concern Present (06/01/2019)  Tobacco Use: Medium Risk (06/05/2024)     Readmission Risk Interventions     No data to display

## 2024-06-22 NOTE — Progress Notes (Signed)
 Occupational Therapy Treatment Patient Details Name: Justin Rasmussen MRN: [id number] DOB: 1955/11/21 Today's Date: 06/22/2024   History of present illness 68 y.o. male history of melanoma currently in the process of getting immunotherapy presented 06/02/24 with altered mental status, aphasia, and Rt sided weakness. Initial CT scan showing a left frontal hemorrhage. MRI showed underlying mass. S/p L frontal crani for tumor resection and evacuation of L frontal hematoma & transbronchial needle aspiration 9/30. Extubated 10/1. Questionable seizure activity noted during hospitalization. Becam hypotensive with respiratory decline required BiPAP and transfer to ICU; PMH-COPD, HF, cardiomyopathy, pulmonary HTN, OSA, melanoma on immunotherapy   OT comments  Patient received in supine and being fed by nursing with patient able to manage cup with RUE. Patient instructed on bed mobility but continues to require max assist +2 to get to EOB and min to CGA for sitting balance. Patient able to stand from EOB into Stedy with max assist +2 and use of bed pads. Patient demonstrating better posture while seated on Stedy pads but requires assistance and frequent cues. Patient was assisted to recliner and performed AAROM to RUE shoulder and elbow.  Patient demonstrating increased functional use with RUE.  Patient will benefit from intensive inpatient follow-up therapy, >3 hours/day. Acute OT to continue to follow to address established goals to facilitate DC to next venue of care.        If plan is discharge home, recommend the following:  Two people to help with walking and/or transfers;Two people to help with bathing/dressing/bathroom;Direct supervision/assist for medications management;Direct supervision/assist for financial management;Assist for transportation;Supervision due to cognitive status;Help with stairs or ramp for entrance   Equipment Recommendations  Other (comment) (defer to next venue of care)     Recommendations for Other Services      Precautions / Restrictions Precautions Precautions: Fall;Other (comment) Recall of Precautions/Restrictions: Intact Precaution/Restrictions Comments: SBP < 160 Restrictions Weight Bearing Restrictions Per Provider Order: No       Mobility Bed Mobility Overal bed mobility: Needs Assistance Bed Mobility: Supine to Sit     Supine to sit: HOB elevated, +2 for physical assistance, +2 for safety/equipment, Max assist, Used rails     General bed mobility comments: cues throughout with max assist +2 due to assistance with trunk and BLEs    Transfers Overall transfer level: Needs assistance Equipment used: Ambulation equipment used Transfers: Sit to/from Stand, Bed to chair/wheelchair/BSC Sit to Stand: Max assist, +2 physical assistance           General transfer comment: bed pads used to assist with hips and max assist +2 to stand from EOB to [place] and from in stedy. Transfer via Lift Equipment: Stedy   Balance Overall balance assessment: Needs assistance Sitting-balance support: Bilateral upper extremity supported, Feet supported Sitting balance-Leahy Scale: Poor Sitting balance - Comments: min A-CGA sitting EOB   Standing balance support: Bilateral upper extremity supported, Reliant on assistive device for balance Standing balance-Leahy Scale: Zero Standing balance comment: reliant on Stedy and therapist for support                           ADL either performed or assessed with clinical judgement   ADL Overall ADL's : Needs assistance/impaired Eating/Feeding: Minimal assistance Eating/Feeding Details (indicate cue type and reason): able to use RUE to assist Grooming: Wash/dry face;Supervision/safety;Sitting Grooming Details (indicate cue type and reason): able to use RUE to wash face  Extremity/Trunk Assessment              Vision       Building surveyor Communication Communication: Impaired Factors Affecting Communication: Difficulty expressing self;Reduced clarity of speech (low volume)   Cognition Arousal: Alert Behavior During Therapy: WFL for tasks assessed/performed Cognition: Cognition impaired, Difficult to assess Difficult to assess due to: Impaired communication   Awareness: Intellectual awareness impaired, Online awareness impaired   Attention impairment (select first level of impairment): Focused attention   OT - Cognition Comments: difficulty attending to tasks                 Following commands: Impaired Following commands impaired: Follows one step commands with increased time, Follows one step commands inconsistently      Cueing   Cueing Techniques: Verbal cues, Gestural cues, Tactile cues, Visual cues  Exercises Exercises: General Upper Extremity General Exercises - Upper Extremity Shoulder Flexion: AAROM, PROM, 5 reps, Right Elbow Flexion: AAROM, Right, 5 reps, Seated Elbow Extension: AAROM, Right, 5 reps, Seated    Shoulder Instructions       General Comments      Pertinent Vitals/ Pain       Pain Assessment Pain Assessment: Faces Faces Pain Scale: Hurts a little bit Pain Location: generalized with mobility Pain Descriptors / Indicators: Grimacing Pain Intervention(s): Monitored during session, Repositioned  Home Living                                          Prior Functioning/Environment              Frequency  Min 2X/week        Progress Toward Goals  OT Goals(current goals can now be found in the care plan section)  Progress towards OT goals: Progressing toward goals  Acute Rehab OT Goals Patient Stated Goal: go to rehab OT Goal Formulation: With patient Time For Goal Achievement: 06/22/24 Potential to Achieve Goals: Good ADL Goals Pt Will Perform Grooming: with contact guard assist;sitting Pt Will Perform Upper Body  Dressing: with min assist;with caregiver independent in assisting;sitting Pt Will Perform Lower Body Dressing: with mod assist;with caregiver independent in assisting;sit to/from stand Pt Will Transfer to Toilet: with mod assist;with +2 assist;stand pivot transfer;bedside commode Pt Will Perform Toileting - Clothing Manipulation and hygiene: with max assist;sit to/from stand Pt/caregiver will Perform Home Exercise Program: Right Upper extremity;With minimal assist Additional ADL Goal #1: Pt will maintain seated balance with GCA for participation in ADL for approx 3-5 min.  Plan      Co-evaluation    PT/OT/SLP Co-Evaluation/Treatment: Yes Reason for Co-Treatment: Complexity of the patient's impairments (multi-system involvement);For patient/therapist safety;Necessary to address cognition/behavior during functional activity;To address functional/ADL transfers   OT goals addressed during session: ADL's and self-care;Strengthening/ROM      AM-PAC OT 6 Clicks Daily Activity     Outcome Measure   Help from another person eating meals?: A Lot Help from another person taking care of personal grooming?: A Lot Help from another person toileting, which includes using toliet, bedpan, or urinal?: Total Help from another person bathing (including washing, rinsing, drying)?: A Lot Help from another person to put on and taking off regular upper body clothing?: A Lot Help from another person to put on and taking off regular lower body clothing?: Total 6 Click Score: 10  End of Session Equipment Utilized During Treatment: Other (comment);Gait belt (Stedy)  OT Visit Diagnosis: Unsteadiness on feet (R26.81);Other abnormalities of gait and mobility (R26.89);Muscle weakness (generalized) (M62.81);Low vision, both eyes (H54.2);Other symptoms and signs involving the nervous system (R29.898);Other symptoms and signs involving cognitive function;Cognitive communication deficit (R41.841);Hemiplegia and  hemiparesis Symptoms and signs involving cognitive functions: Other Nontraumatic ICH Hemiplegia - Right/Left: Right Hemiplegia - dominant/non-dominant: Dominant Hemiplegia - caused by: Other Nontraumatic intracranial hemorrhage   Activity Tolerance Patient tolerated treatment well   Patient Left in chair;with call bell/phone within reach;with chair alarm set   Nurse Communication Mobility status;Need for lift equipment;Precautions        Time: 0922-0950 OT Time Calculation (min): 28 min  Charges: OT General Charges $OT Visit: 1 Visit OT Treatments $Therapeutic Activity: 8-22 mins  Dick Laine, OTA Acute Rehabilitation Services  Office [phone]   Jeb LITTIE Laine 06/22/2024, 2:26 PM

## 2024-06-22 NOTE — Plan of Care (Signed)
 Problem: Education: Goal: Knowledge of disease or condition will improve 06/22/2024 1115 by Berkeley Verdie BRAVO, RN Outcome: Adequate for Discharge 06/22/2024 1049 by Berkeley Verdie BRAVO, RN Outcome: Progressing Goal: Knowledge of secondary prevention will improve (MUST DOCUMENT ALL) Outcome: Adequate for Discharge Goal: Knowledge of patient specific risk factors will improve (DELETE if not current risk factor) Outcome: Adequate for Discharge   Problem: Intracerebral Hemorrhage Tissue Perfusion: Goal: Complications of Intracerebral Hemorrhage will be minimized 06/22/2024 1115 by Berkeley Verdie BRAVO, RN Outcome: Adequate for Discharge 06/22/2024 1049 by Berkeley Verdie BRAVO, RN Outcome: Progressing   Problem: Coping: Goal: Will verbalize positive feelings about self Outcome: Adequate for Discharge Goal: Will identify appropriate support needs Outcome: Adequate for Discharge   Problem: Health Behavior/Discharge Planning: Goal: Ability to manage health-related needs will improve Outcome: Adequate for Discharge Goal: Goals will be collaboratively established with patient/family Outcome: Adequate for Discharge   Problem: Self-Care: Goal: Ability to participate in self-care as condition permits will improve 06/22/2024 1115 by Berkeley Verdie BRAVO, RN Outcome: Adequate for Discharge 06/22/2024 1049 by Berkeley Verdie BRAVO, RN Outcome: Progressing Goal: Verbalization of feelings and concerns over difficulty with self-care will improve Outcome: Adequate for Discharge Goal: Ability to communicate needs accurately will improve Outcome: Adequate for Discharge   Problem: Nutrition: Goal: Risk of aspiration will decrease Outcome: Adequate for Discharge Goal: Dietary intake will improve Outcome: Adequate for Discharge   Problem: Education: Goal: Knowledge of General Education information will improve Description: Including pain rating scale, medication(s)/side effects and non-pharmacologic comfort  measures Outcome: Adequate for Discharge   Problem: Health Behavior/Discharge Planning: Goal: Ability to manage health-related needs will improve Outcome: Adequate for Discharge   Problem: Clinical Measurements: Goal: Ability to maintain clinical measurements within normal limits will improve Outcome: Adequate for Discharge Goal: Will remain free from infection Outcome: Adequate for Discharge Goal: Diagnostic test results will improve Outcome: Adequate for Discharge Goal: Respiratory complications will improve Outcome: Adequate for Discharge Goal: Cardiovascular complication will be avoided Outcome: Adequate for Discharge   Problem: Activity: Goal: Risk for activity intolerance will decrease Outcome: Adequate for Discharge   Problem: Nutrition: Goal: Adequate nutrition will be maintained Outcome: Adequate for Discharge   Problem: Coping: Goal: Level of anxiety will decrease Outcome: Adequate for Discharge   Problem: Elimination: Goal: Will not experience complications related to bowel motility Outcome: Adequate for Discharge Goal: Will not experience complications related to urinary retention Outcome: Adequate for Discharge   Problem: Pain Managment: Goal: General experience of comfort will improve and/or be controlled Outcome: Adequate for Discharge   Problem: Safety: Goal: Ability to remain free from injury will improve Outcome: Adequate for Discharge   Problem: Skin Integrity: Goal: Risk for impaired skin integrity will decrease Outcome: Adequate for Discharge   Problem: Education: Goal: Ability to describe self-care measures that may prevent or decrease complications (Diabetes Survival Skills Education) will improve Outcome: Adequate for Discharge Goal: Individualized Educational Video(s) Outcome: Adequate for Discharge   Problem: Coping: Goal: Ability to adjust to condition or change in health will improve Outcome: Adequate for Discharge   Problem:  Fluid Volume: Goal: Ability to maintain a balanced intake and output will improve Outcome: Adequate for Discharge   Problem: Health Behavior/Discharge Planning: Goal: Ability to identify and utilize available resources and services will improve Outcome: Adequate for Discharge Goal: Ability to manage health-related needs will improve Outcome: Adequate for Discharge   Problem: Metabolic: Goal: Ability to maintain appropriate glucose levels will improve Outcome: Adequate for Discharge   Problem: Nutritional:  Goal: Maintenance of adequate nutrition will improve Outcome: Adequate for Discharge Goal: Progress toward achieving an optimal weight will improve Outcome: Adequate for Discharge   Problem: Skin Integrity: Goal: Risk for impaired skin integrity will decrease Outcome: Adequate for Discharge   Problem: Tissue Perfusion: Goal: Adequacy of tissue perfusion will improve Outcome: Adequate for Discharge   Problem: Activity: Goal: Ability to tolerate increased activity will improve Outcome: Adequate for Discharge   Problem: Respiratory: Goal: Ability to maintain a clear airway and adequate ventilation will improve Outcome: Adequate for Discharge   Problem: Role Relationship: Goal: Method of communication will improve Outcome: Adequate for Discharge   Problem: Education: Goal: Knowledge of the prescribed therapeutic regimen will improve Outcome: Adequate for Discharge   Problem: Clinical Measurements: Goal: Usual level of consciousness will be regained or maintained. Outcome: Adequate for Discharge Goal: Neurologic status will improve Outcome: Adequate for Discharge Goal: Ability to maintain intracranial pressure will improve Outcome: Adequate for Discharge

## 2024-06-22 NOTE — Discharge Summary (Signed)
 Physician Discharge Summary  Justin Rasmussen FMW:[id number] DOB: 08/22/1956 DOA: 06/02/2024  PCP: Colette Torrence GRADE, MD  Admit date: 06/02/2024 Discharge date: 06/22/2024  Admitted From: Home Disposition:  Acute inpatient rehab  Discharge Condition:Stable CODE STATUS:FULL Diet recommendation: Dysphagia 3  Brief/Interim Summary: Patient is a 68 year old male with history of pulm hypertension, hyperlipidemia, OSA on CPAP, HFpEF, COPD, melanoma presented with right-sided weakness and aphasia.  Imaging showed left frontal lobe ICH secondary to hemorrhagic metastatic mass with associated worsening edema.  Patient required ICU admission.  Neurosurgery was consulted and he underwent crani with hematoma evacuation and tumor resection.  Hospital course remarkable for dysphagia requiring feeding tube placement, respiratory failure requiring intubation, septic shock due to aspiration pneumonia.  Currently hemodynamically stable.  Tolerating dysphagia 3 diet.  PT/OT recommend acute inpatient rehab on discharge.  Medically stable for discharge   Following problems were addressed during the hospitalization:  Acute left frontal lobe intracranial hemorrhage: Presented with right-sided weakness, aphasia.  CT head showed hemorrhage in the anterior left frontal lobe measuring 4.3 X6.2X 4.2 cm, worsening edema with mass effect and 9 mm midline shift.MRI brain (9/28) significant for a hemorrhagic tumor in the anterior left frontal lobe, with a 2.5 cm enhancing tumor and increased hematoma. CTA head and neck (9/27) significant for no large vessel occlusion. LDL of 93. Hemoglobin A1C of 5.5%. Transthoracic Echocardiogram significant for an LVEF of 60-65% and no atrial level shunt . Neurosurgery consulted.  Started on Decadron .  Underwent creatinine with hematoma evacuation vertebral dissection on 9/30.  Neurosurgery recommended steroid for long term.Continue  on keppra.  Decadron  reduced to 2 mg every 12 hours.  Follow-up  with Dr. Lanis, neurosurgery, as an outpatient   Metastatic melanoma: Under treatment with immunotherapy as an outpatient.  We recommend to follow-up with oncology as an outpatient.   Leukocytosis: This is most likely from the steroids.  Stable.   History of COPD: Currently not on exertion.  Continue bronchodilators as needed   Septic shock: Presumed to be from aspiration pneumonia.  Required vasopressor support and was given IV antibiotics.  Vasopressors have been weaned off.    Completed antibiotics course   Dysphagia: Likely related to ICH, acute illness.  Speech therapy/dietitian following.  Started on tube feeding.  Currently he is on dysphagia 3 diet.   History of HFpEF: Currently euvolemic.  Echo showed EF of 60 to 60% with normal diastolic parameters.  Will discontinue Entresto  and Coreg  because blood pressure is soft.   OSA: CPAP at bedtime.  History of pulmonary hypertension   Hypertension: On Coreg  and Entresto  as an outpatient.  Will discontinue  History of hyperlipidemia: On Crestor     Diabetes type 2: Well-controlled.  A1c of 5.5.  Was taking Farxiga  as an outpatient.  Can continue sliding scale at acute inpatient rehab   debility/deconditioning: Secondary to subdural hemorrhage.  PT /OT recommending AIR  on discharge   Morbid obesity: BMI of 46   Discharge Diagnoses:  Principal Problem:   Intracranial hemorrhage (HCC) Active Problems:   Morbid obesity (HCC)   Elevated hemidiaphragm-Rt   OSA (obstructive sleep apnea)   Melanoma metastatic to brain (HCC)   Dysphagia   Hypernatremia   HTN (hypertension)   Chronic diastolic CHF (congestive heart failure) (HCC)   AKI (acute kidney injury)   COPD (chronic obstructive pulmonary disease) (HCC)   Prediabetes    Discharge Instructions  Discharge Instructions     Diet general   Complete by: As directed    Dysphagia  3 diet   Discharge instructions   Complete by: As directed    1)Please take your  medications as instructed 2)Follow up with neurosurgery as an outpatient   Increase activity slowly   Complete by: As directed    No wound care   Complete by: As directed       Allergies as of 06/22/2024   No Known Allergies      Medication List     STOP taking these medications    aspirin  81 MG chewable tablet   carvedilol  6.25 MG tablet Commonly known as: COREG    Entresto  97-103 MG Generic drug: sacubitril -valsartan        TAKE these medications    acetaminophen  500 MG tablet Commonly known as: TYLENOL  Take 1,000 mg by mouth.   albuterol  108 (90 Base) MCG/ACT inhaler Commonly known as: VENTOLIN  HFA Inhale 2 puffs into the lungs every 6 (six) hours as needed for wheezing or shortness of breath.   cetirizine 10 MG tablet Commonly known as: ZYRTEC Take 10 mg by mouth daily.   dexamethasone  2 MG tablet Commonly known as: DECADRON  Take 1 tablet (2 mg total) by mouth every 12 (twelve) hours.   Farxiga  10 MG Tabs tablet Generic drug: dapagliflozin  propanediol Take 1 tablet (10 mg total) by mouth daily before breakfast. Patient must schedule annual appointment for further refills   insulin aspart 100 UNIT/ML injection Commonly known as: novoLOG Inject 0-15 Units into the skin 3 (three) times daily with meals.   levETIRAcetam 500 MG tablet Commonly known as: KEPPRA Take 1 tablet (500 mg total) by mouth 2 (two) times daily.   montelukast  10 MG tablet Commonly known as: Singulair  Take one tab PO QHS   multivitamin with minerals Tabs tablet Take 1 tablet by mouth daily.   polyethylene glycol 17 g packet Commonly known as: MIRALAX / GLYCOLAX Take 17 g by mouth daily as needed for moderate constipation.   rosuvastatin  20 MG tablet Commonly known as: CRESTOR  Take 1 tablet (20 mg total) by mouth daily. Patient must schedule annual appointment for further refills What changed:  medication strength how much to take   torsemide  20 MG tablet Commonly known  as: DEMADEX  Take 1 tablet (20 mg total) by mouth as needed (Swelling). What changed: when to take this        Follow-up Information     Lanis Pupa, MD. Schedule an appointment as soon as possible for a visit in 2 week(s).   Specialty: Neurosurgery Contact information: [address] [place] [address] [phone]                No Known Allergies  Consultations: Neurosurgery   Procedures/Studies: DG Tibia/Fibula Right Port Result Date: 06/15/2024 CLINICAL DATA:  Bony prominence of the right tibia EXAM: PORTABLE RIGHT TIBIA AND FIBULA - 2 VIEW COMPARISON:  None Available. FINDINGS: There is no evidence of fracture or other focal bone lesions. Soft tissues are unremarkable. IMPRESSION: No focal osseous abnormality. Electronically Signed   By: Limin  Xu M.D.   On: 06/15/2024 18:45   CT HEAD WO CONTRAST ( ) Result Date: 06/11/2024 EXAM: CT HEAD WITHOUT CONTRAST 06/11/2024 10:33:00 AM TECHNIQUE: CT of the head was performed without the administration of intravenous contrast. Automated exposure control, iterative reconstruction, and/or weight based adjustment of the mA/kV was utilized to reduce the radiation dose to as low as reasonably achievable. COMPARISON: CT head 06/10/2024 and MRI 06/06/2024. CLINICAL HISTORY: Stroke, follow up. FINDINGS: BRAIN AND VENTRICLES: A resection  cavity containing blood in the left frontal lobe is unchanged from yesterday's CT. A small subdural hematoma over the left frontal convexity measuring up to 6 mm in thickness is unchanged. Small volume pneumocephalus has resolved. Scattered small volume bilateral subarachnoid hemorrhage and small volume hemorrhage layering in the lateral ventricles are unchanged from the prior CT. 7 mm of rightward midline shift is unchanged. Left frontal lobe edema is unchanged. No new intracranial hemorrhage, hydrocephalus, or acute large territory infarct is identified. ORBITS: No acute  abnormality. SINUSES: Mild mucosal thickening in the paranasal sinuses. Persistent large right and small left mastoid effusions. Improved right middle ear aeration. SOFT TISSUES AND SKULL: Status post left frontal craniotomy with mild soft tissue swelling and gas in the overlying scalp and with skin staples remaining in place. IMPRESSION: 1. Largely stable postoperative changes and multicompartment intracranial hemorrhage including 7 mm of rightward midline shift. 2. No evidence of a new intracranial abnormality. Electronically signed by: Dasie Hamburg MD 06/11/2024 10:53 AM EDT RP Workstation: [id number]   EEG adult Result Date: 06/11/2024 Shelton Arlin KIDD, MD     06/11/2024  9:01 AM Patient Name: Justin Rasmussen MRN: [id number] Epilepsy Attending: Arlin KIDD Shelton Referring Physician/Provider: Claudene Toribio BROCKS, MD Date: 06/11/2024 Duration: 22.30 mins Patient history: 68yo M with ams. EEG to evaluate for seizure Level of alertness: Awake/ lethargic AEDs during EEG study: LEV Technical aspects: This EEG study was done with scalp electrodes positioned according to the 10-20 International system of electrode placement. Electrical activity was reviewed with band pass filter of 1-70Hz , sensitivity of 7 uV/mm, display speed of 21mm/sec with a 60Hz  notched filter applied as appropriate. EEG data were recorded continuously and digitally stored.  Video monitoring was available and reviewed as appropriate. Description: EEG showed continuous generalized predominantly 5 to 6 Hz theta slowing admixed with intermittent 2-3hz  delta slowing. There is also sharply contoured 3-5hz  theta-delta slowing in left frontal region consistent with breach artifact. Hyperventilation and photic stimulation were not performed.   ABNORMALITY - Breach artifact, left frontal - Continuous slow, generalized IMPRESSION: This study is suggestive of cortical dysfunction in left frontal region consistent with underlying craniotomy. Additionally there is non  specific generalized cerebral dysfunction/ encephalopathy. No seizures or definite epileptiform discharges were seen throughout the recording. EEG appears to have improved compared to previous study on 06/08/2024 Arlin KIDD Shelton   DG Abd 1 View Result Date: 06/10/2024 CLINICAL DATA:  [id number] Encounter for care related to feeding tube [id number] EXAM: ABDOMEN - 1 VIEW COMPARISON:  Chest x-ray 06/10/2024, chest x-ray 06/09/2024 FINDINGS: Enteric tube better visualized with tip coursing below the hemidiaphragm with tip overlying the expected region of the gastric antrum. The bowel gas pattern is normal. No radio-opaque calculi or other significant radiographic abnormality are seen. Persistent elevation of the right hemidiaphragm IMPRESSION: 1. Enteric tube in good position. 2. Persistent elevation of the right hemidiaphragm. Electronically Signed   By: Morgane  Naveau M.D.   On: 06/10/2024 15:39   DG CHEST PORT 1 VIEW Result Date: 06/10/2024 CLINICAL DATA:  [id number] Acute and chronic respiratory failure (acute-on-chronic) (HCC) [id number] EXAM: PORTABLE CHEST 1 VIEW COMPARISON:  Chest x-ray 06/09/2024, chest x-ray 06/05/2024 FINDINGS: Enteric tube with tip poorly visualized and possibly above the hemidiaphragm along the distal esophagus. The heart and mediastinal contours are unchanged. Elevated right hemidiaphragm. No focal consolidation. No pulmonary edema. No pleural effusion. No pneumothorax. No acute osseous abnormality. IMPRESSION: 1. Enteric tube with tip poorly visualized and possibly above the hemidiaphragm along the distal  esophagus. Question interval enteric tube retraction. Recommend x-ray upper abdomen for further evaluation. 2. Persistent elevation of the right hemidiaphragm with no definite acute cardiopulmonary abnormality. Electronically Signed   By: Morgane  Naveau M.D.   On: 06/10/2024 14:21   DG CHEST PORT 1 VIEW Addendum Date: 06/10/2024 ADDENDUM REPORT: 06/10/2024 14:15 ADDENDUM: Impression should  state 2. Enteric tube in appropriate position. (NOT foley). Electronically Signed   By: Morgane  Naveau M.D.   On: 06/10/2024 14:15   Result Date: 06/10/2024 CLINICAL DATA:  [id number] Acute and chronic respiratory failure (acute-on-chronic) (HCC) [id number] EXAM: PORTABLE CHEST 1 VIEW COMPARISON:  Chest x-ray 06/05/2024 FINDINGS: Patient is rotated. Enteric tube tip coursing below the diaphragm with tip overlying the right quadrant likely in the region of the gastric antrum. Interval removal of an endotracheal tube. The heart and mediastinal contours are within normal limits. Elevated right hemidiaphragm. No focal consolidation. No pulmonary edema. No pleural effusion. No pneumothorax. No acute osseous abnormality. IMPRESSION: 1. No active disease. 2. Foley in appropriate position Electronically Signed: By: Morgane  Naveau M.D. On: 06/09/2024 10:50   CT HEAD WO CONTRAST ( ) Result Date: 06/10/2024 EXAM: CT HEAD WITHOUT CONTRAST 06/10/2024 10:03:54 AM TECHNIQUE: CT of the head was performed without the administration of intravenous contrast. Automated exposure control, iterative reconstruction, and/or weight based adjustment of the mA/kV was utilized to reduce the radiation dose to as low as reasonably achievable. COMPARISON: CT of the head dated 06/07/2024. CLINICAL HISTORY: Craniotomy, post-op. Non con. Craniotomy, post-op. FINDINGS: BRAIN AND VENTRICLES: Status post left frontal parietal craniotomy for evacuation of a hematoma within the left frontal lobe. The surgical evacuation cavity appears unchanged. There is less subdural fluid and air now present. There is mild improvement of shift to the midline structures to the right from 8 mm to 6 mm. There is residual intraventricular hemorrhage present bilaterally and residual diffuse subarachnoid hemorrhage which appear unchanged. No evidence of acute infarct. No hydrocephalus. There is no adverse interval change. ORBITS: No acute abnormality. SINUSES: No acute  abnormality. SOFT TISSUES AND SKULL: No acute soft tissue abnormality. No skull fracture. IMPRESSION: 1. Postoperative changes from left frontoparietal craniotomy with unchanged surgical cavity; decreased subdural fluid/air and mild improvement in rightward midline shift from 8 mm to 6 mm. 2. Residual intraventricular and diffuse subarachnoid hemorrhage, unchanged. 3. No adverse interval change. Electronically signed by: Evalene Coho MD 06/10/2024 10:13 AM EDT RP Workstation: [id number]   Overnight EEG with video Result Date: 06/07/2024 Shelton Arlin KIDD, MD     06/08/2024  9:10 AM Patient Name: Justin Rasmussen MRN: [id number] Epilepsy Attending: Arlin KIDD Shelton Referring Physician/Provider: Jerri Pfeiffer, MD  Duration: 06/06/2024 1316 to 06/07/2024 1120  Patient history: 68yo M with lower right jaw rhythmic movements. EEG to evaluate for seizure  Level of alertness: Awake/ lethargic  AEDs during EEG study: LEV  Technical aspects: This EEG study was done with scalp electrodes positioned according to the 10-20 International system of electrode placement. Electrical activity was reviewed with band pass filter of 1-70Hz , sensitivity of 7 uV/mm, display speed of 76mm/sec with a 60Hz  notched filter applied as appropriate. EEG data were recorded continuously and digitally stored.  Video monitoring was available and reviewed as appropriate.  Description: EEG showed continuous generalized and maximal bilateral frontal polymorphic sharply contoured 3 to 6 Hz theta-delta slowing. Hyperventilation and photic stimulation were not performed.    ABNORMALITY - Continuous slow, generalized  IMPRESSION: This study is suggestive of moderate to severe diffuse encephalopathy. No seizures or definite  epileptiform discharges were seen throughout the recording.  Priyanka MALVA Krebs    CT HEAD WO CONTRAST ( ) Result Date: 06/07/2024 EXAM: CT HEAD WITHOUT CONTRAST 06/07/2024 04:26:58 AM TECHNIQUE: CT of the head was performed without the  administration of intravenous contrast. Automated exposure control, iterative reconstruction, and/or weight based adjustment of the mA/kV was utilized to reduce the radiation dose to as low as reasonably achievable. COMPARISON: CT of the head dated 06/03/2024. CLINICAL HISTORY: Stroke, follow up. FINDINGS: BRAIN AND VENTRICLES: Status post left frontal parietal craniotomy for partial evacuation of hemorrhagic mass within the left frontal lobe. The area of intraparenchymal hemorrhage has significantly decreased in size from 5.0 cm in transverse diameter to 3.5 cm. There has been interval improvement of shift of the midline structures from 11 mm to 8 mm. There has been interval development of subarachnoid hemorrhage bilaterally, most conspicuously seen within the right parietal sulci. There is also intraventricular hemorrhage present bilaterally, which is mildly increased in volume in the interim. No evidence of acute infarct. No hydrocephalus. ORBITS: No acute abnormality. SINUSES: No acute abnormality. SOFT TISSUES AND SKULL: Status post left frontal parietal craniotomy. No acute soft tissue abnormality. No skull fracture. IMPRESSION: 1. Interval improvement of left frontal lobe intraparenchymal hemorrhage and midline shift. 2. Interval development of bilateral subarachnoid hemorrhage, most conspicuous in the right parietal sulci. 3. Mildly increased bilateral intraventricular hemorrhage. Electronically signed by: Evalene Coho MD 06/07/2024 04:46 AM EDT RP Workstation: [id number]   EEG adult Result Date: 06/06/2024 Krebs Arlin MALVA, MD     06/06/2024  3:25 PM Patient Name: Justin Rasmussen MRN: [id number] Epilepsy Attending: Arlin MALVA Krebs Referring Physician/Provider: Judithe Rocky BROCKS, NP Date: 06/06/2024 Duration: 22.51 mins Patient history: 68yo M with lower right jaw rhythmic movements. EEG to evaluate for seizure Level of alertness: Awake/ lethargic AEDs during EEG study: LEV Technical aspects: This EEG study  was done with scalp electrodes positioned according to the 10-20 International system of electrode placement. Electrical activity was reviewed with band pass filter of 1-70Hz , sensitivity of 7 uV/mm, display speed of 35mm/sec with a 60Hz  notched filter applied as appropriate. EEG data were recorded continuously and digitally stored.  Video monitoring was available and reviewed as appropriate. Description: EEG showed continuous generalized and maximal bilateral frontal polymorphic sharply contoured 3 to 6 Hz theta-delta slowing. Hyperventilation and photic stimulation were not performed.   ABNORMALITY - Continuous slow, generalized IMPRESSION: This study is suggestive of moderate to severe diffuse encephalopathy. No seizures or definite epileptiform discharges were seen throughout the recording. If suspicion for interictal activity remains a concern, a prolonged study should be considered. Arlin MALVA Krebs   MR BRAIN W WO CONTRAST Result Date: 06/06/2024 CLINICAL DATA:  67 year old male presenting with imaging findings of hemorrhagic brain tumor postoperative day zero on 06/02/2024, past medical history of melanoma. Status post left frontal craniotomy, tumor resection, hematoma evacuation. EXAM: MRI HEAD WITHOUT AND WITH CONTRAST TECHNIQUE: Multiplanar, multiecho pulse sequences of the brain and surrounding structures were obtained without and with intravenous contrast. CONTRAST:  10mL GADAVIST  GADOBUTROL  1 MMOL/ML IV SOLN COMPARISON:  Preoperative brain MRI 06/03/2024. FINDINGS: Brain: Left anterior frontal craniotomy changes now. Underlying combined resection cavity and decreased volume of mixed signal intra-axial hemorrhage, hematoma. Layering hematocrit no longer visible. Cystic resection cavity now (stellate, 3-4 mm) versus previous 5-6 cm hematoma. And following contrast the rounded 2.5 cm enhancing mass is no longer visible. Some intrinsic T1 hyperintense areas along the resection cavity (series 14, image  45) likely are  blood products. No suspicious intracranial enhancement identified now. Overlying postoperative pneumocephalus and 5-6 mm thick extra-axial hematoma/seroma (series 10, image 15). Based on SWI, intraventricular blood volume has not progressed. Basilar cisterns remain patent. Left lateral ventricle patency has improved. And rightward midline shift of 10-11 mm is stable. Bilateral subarachnoid hemorrhage is more apparent (series 12, image 48) but volume is not large. On DWI there is scattered resection cavity diffusion restriction versus blood related susceptibility (series 6, image 32). Other SAH and IVH related DWI susceptibility. No areas of diffusion restriction suspicious for vascular territory infarct. Negative pituitary and cervicomedullary junction. Vascular: Major intracranial vascular flow voids are stable. Following contrast major dural venous sinuses are enhancing and appear to be patent. Skull and upper cervical spine: New craniotomy.  Otherwise stable. Sinuses/Orbits: Stable; right maxillary sinus aeration has improved. Other: Intubated with mildly increased mastoid effusions. And retained secretions in the pharynx. New postoperative changes to the scalp soft tissues. Broad-based left scalp hematoma. IMPRESSION: 1. Decreased left frontal lobe hematoma volume, and resection of underlying round enhancing mass status post craniotomy. 2. Postoperative sequelae including resection cavity with marginal diffusion restriction which might enhance on follow-up. No new metastatic disease identified. Stable to mildly improved intracranial mass effect. Stable IVH, while bilateral SAH may have increased. Electronically Signed   By: VEAR Hurst M.D.   On: 06/06/2024 04:13   DG Abd Portable 1V Result Date: 06/05/2024 CLINICAL DATA:  OG tube placement EXAM: PORTABLE ABDOMEN - 1 VIEW COMPARISON:  None Available. FINDINGS: Enteric tube tip overlies the distal stomach. Upper gas pattern is nonobstructed  IMPRESSION: Enteric tube tip overlies the distal stomach. Electronically Signed   By: Luke Bun M.D.   On: 06/05/2024 15:47   DG CHEST PORT 1 VIEW Result Date: 06/05/2024 CLINICAL DATA:  OG tube placement EXAM: PORTABLE CHEST 1 VIEW COMPARISON:  06/02/2024, 11/16/2023, 06/01/2019, brain MRI 06/03/2024 FINDINGS: Interval intubation, tip of the endotracheal tube is about 2.3 cm superior to the carina. Enteric tube tip below the diaphragm but incompletely assessed.chronic elevation of the right diaphragm. Worsening opacity at the right base. Increased left basilar opacity. Possible occlusion of right bronchus. Some volume loss and shift to the right. IMPRESSION: 1. Interval intubation, tip of the endotracheal tube is about 2.3 cm superior to the carina. Enteric tube tip below the diaphragm but incompletely assessed. 2. Chronic elevation of right diaphragm but with worsening opacity at the right base with increased left basilar opacity, atelectasis versus pneumonia versus aspiration. Possible occlusion/abrupt cut off of right bronchus. Consider correlation with contrast-enhanced chest CT Electronically Signed   By: Luke Bun M.D.   On: 06/05/2024 15:47   ECHOCARDIOGRAM COMPLETE Result Date: 06/04/2024    ECHOCARDIOGRAM REPORT   Patient Name:   Justin Rasmussen Date of Exam: 06/04/2024 Medical Rec #:  [id number]     Height:       67.0 in Accession #:    [id number]    Weight:       304.5 lb Date of Birth:  Aug 25, 1956     BSA:          2.418 m Patient Age:    68 years      BP:           147/81 mmHg Patient Gender: M             HR:           64 bpm. Exam Location:  Inpatient Procedure: 2D Echo, Color Doppler and Cardiac Doppler (  Both Spectral and Color            Flow Doppler were utilized during procedure). Indications:    Stroke I63.9  History:        Patient has prior history of Echocardiogram examinations, most                 recent 06/02/2019. CHF. H/O Melanoma, Hemorrhagic stroke,                 Nonischemic  cardiomyopathy, Pulmonary hypertension.  Sonographer:    BERNARDA ROCKS Referring Phys: Anius.Banister MCNEILL P KIRKPATRICK IMPRESSIONS  1. Left ventricular ejection fraction, by estimation, is 60 to 65%. The left ventricle has normal function. The left ventricle has no regional wall motion abnormalities. The left ventricular internal cavity size was mildly dilated. Left ventricular diastolic parameters were normal.  2. Reduced PAT suggessive of mild RV dysfunction. Right ventricular systolic function is mildly reduced. The right ventricular size is mildly enlarged. Tricuspid regurgitation signal is inadequate for assessing PA pressure.  3. The mitral valve is normal in structure. No evidence of mitral valve regurgitation. No evidence of mitral stenosis.  4. The aortic valve is normal in structure. There is mild calcification of the aortic valve. Aortic valve regurgitation is not visualized. No aortic stenosis is present.  5. The inferior vena cava is normal in size with greater than 50% respiratory variability, suggesting right atrial pressure of 3 mmHg. Comparison(s): No significant change from prior study. FINDINGS  Left Ventricle: Left ventricular ejection fraction, by estimation, is 60 to 65%. The left ventricle has normal function. The left ventricle has no regional wall motion abnormalities. The left ventricular internal cavity size was mildly dilated. There is  no left ventricular hypertrophy. Left ventricular diastolic parameters were normal. Right Ventricle: Reduced PAT suggessive of mild RV dysfunction. The right ventricular size is mildly enlarged. No increase in right ventricular wall thickness. Right ventricular systolic function is mildly reduced. Tricuspid regurgitation signal is inadequate for assessing PA pressure. Left Atrium: Left atrial size was normal in size. Right Atrium: Right atrial size was normal in size. Pericardium: There is no evidence of pericardial effusion. Mitral Valve: The mitral valve is  normal in structure. No evidence of mitral valve regurgitation. No evidence of mitral valve stenosis. MV peak gradient, 7.0 mmHg. The mean mitral valve gradient is 3.0 mmHg. Tricuspid Valve: The tricuspid valve is normal in structure. Tricuspid valve regurgitation is trivial. No evidence of tricuspid stenosis. Aortic Valve: The aortic valve is normal in structure. There is mild calcification of the aortic valve. Aortic valve regurgitation is not visualized. No aortic stenosis is present. Aortic valve mean gradient measures 3.0 mmHg. Aortic valve peak gradient measures 10.0 mmHg. Aortic valve area, by VTI measures 3.99 cm. Pulmonic Valve: The pulmonic valve was normal in structure. Pulmonic valve regurgitation is not visualized. No evidence of pulmonic stenosis. Aorta: The aortic root is normal in size and structure. Venous: The inferior vena cava is normal in size with greater than 50% respiratory variability, suggesting right atrial pressure of 3 mmHg. IAS/Shunts: No atrial level shunt detected by color flow Doppler.  LEFT VENTRICLE PLAX 2D LVIDd:         6.30 cm      Diastology LVIDs:         4.20 cm      LV e' medial:    15.20 cm/s LV PW:         0.80 cm      LV  E/e' medial:  8.1 LV IVS:        0.70 cm      LV e' lateral:   10.30 cm/s LVOT diam:     2.20 cm      LV E/e' lateral: 11.9 LV SV:         129 LV SV Index:   53 LVOT Area:     3.80 cm  LV Volumes (MOD) LV vol d, MOD A2C: 159.0 ml LV vol d, MOD A4C: 142.0 ml LV vol s, MOD A2C: 60.0 ml LV vol s, MOD A4C: 57.2 ml LV SV MOD A2C:     99.0 ml LV SV MOD A4C:     142.0 ml LV SV MOD BP:      94.8 ml RIGHT VENTRICLE             IVC RV Basal diam:  4.20 cm     IVC diam: 2.20 cm RV S prime:     22.70 cm/s TAPSE (M-mode): 2.3 cm LEFT ATRIUM             Index        RIGHT ATRIUM           Index LA diam:        3.40 cm 1.41 cm/m   RA Area:     14.60 cm LA Vol (A2C):   45.3 ml 18.74 ml/m  RA Volume:   39.70 ml  16.42 ml/m LA Vol (A4C):   46.9 ml 19.40 ml/m LA  Biplane Vol: 47.2 ml 19.52 ml/m  AORTIC VALVE                    PULMONIC VALVE AV Area (Vmax):    3.80 cm     PV Vmax:          1.26 m/s AV Area (Vmean):   4.32 cm     PV Peak grad:     6.4 mmHg AV Area (VTI):     3.99 cm     PR End Diast Vel: 1.77 msec AV Vmax:           158.00 cm/s AV Vmean:          76.900 cm/s AV VTI:            0.324 m AV Peak Grad:      10.0 mmHg AV Mean Grad:      3.0 mmHg LVOT Vmax:         158.00 cm/s LVOT Vmean:        87.300 cm/s LVOT VTI:          0.340 m LVOT/AV VTI ratio: 1.05  AORTA Ao Root diam: 3.20 cm Ao Asc diam:  3.80 cm MITRAL VALVE MV Area (PHT): 3.00 cm     SHUNTS MV Area VTI:   3.01 cm     Systemic VTI:  0.34 m MV Peak grad:  7.0 mmHg     Systemic Diam: 2.20 cm MV Mean grad:  3.0 mmHg MV Vmax:       1.32 m/s MV Vmean:      73.4 cm/s MV Decel Time: 253 msec MV E velocity: 123.00 cm/s MV A velocity: 99.20 cm/s MV E/A ratio:  1.24 Morene Brownie Electronically signed by Morene Brownie Signature Date/Time: 06/04/2024/12:14:24 PM    Final    CT HEAD WO CONTRAST ( ) Result Date: 06/03/2024 EXAM: CT HEAD WITHOUT CONTRAST 06/03/2024 03:12:55 PM TECHNIQUE: CT of the head was performed without  the administration of intravenous contrast. Automated exposure control, iterative reconstruction, and/or weight based adjustment of the mA/kV was utilized to reduce the radiation dose to as low as reasonably achievable. COMPARISON: None available. CLINICAL HISTORY: Stroke, hemorrhagic. F/u. FINDINGS: BRAIN AND VENTRICLES: Left frontal lobe hemorrhagic mass is again noted. The hemorrhage is stable from the MRI measuring up to 6 cm in maximal dimension. Vasogenic edema is again noted. Midline shift measures up to 11 mm. Subarachnoid blood is now present in the posterior right frontal lobe. Interventricular hemorrhage is present in the posterior horn of the left lateral ventricle. No evidence of acute infarct. No hydrocephalus is present. A 7 mm midline lipoma is present along the  intracerebral falx between the frontal lobes. No extra-axial collection. ORBITS: No acute abnormality. SINUSES: No acute abnormality. SOFT TISSUES AND SKULL: No acute soft tissue abnormality. No skull fracture. IMPRESSION: 1. Stable left frontal lobe hemorrhagic mass measuring up to 6 cm with associated vasogenic edema and midline shift of up to 11 mm. 2. Subarachnoid blood in the posterior right frontal lobe is new since the prior studies 3. Intraventricular hemorrhage in the posterior horn of the left lateral ventricle. No hydrocephalus. Electronically signed by: Lonni Necessary MD 06/03/2024 03:30 PM EDT RP Workstation: [id number]   MR BRAIN W WO CONTRAST Result Date: 06/03/2024 CLINICAL DATA:  68 year old male code stroke presentation yesterday with large left frontal lobe intra-axial hemorrhage. EXAM: MRI HEAD WITHOUT AND WITH CONTRAST TECHNIQUE: Multiplanar, multiecho pulse sequences of the brain and surrounding structures were obtained without and with intravenous contrast. CONTRAST:  10mL GADAVIST  GADOBUTROL  1 MMOL/ML IV SOLN COMPARISON:  CT head, CTA head and neck yesterday. FINDINGS: Study is intermittently degraded by motion artifact despite repeated imaging attempts. Brain: Large in heterogeneous intra-axial hemorrhage with layering hematocrit level in the anterior left frontal lobe, blood products encompassing roughly 65 by 55 by 46 mm on MRI now (AP by transverse by CC) for an estimated blood volume 82 mL. This compares to approximately 64 mL on the presentation CT when measured with the same technique. Surrounding T2 and FLAIR hyperintense edema. Following contrast there is rounded, masslike enhancement along the anterior superior margin of the hemorrhage in an area of 25 x 22 x 18 mm (series 19, image 20 and series 20, image 24. That lesion also appears different from the remaining hematoma on DWI. No 2nd enhancing brain lesion is identified. No dural thickening. Intraventricular blood volume  is moderate and increased since the presentation CT. Ventricle size and configuration not significantly changed. No significant subarachnoid or other extra-axial blood component is identified. Basilar cistern patency appears stable from the CTA. Rightward midline shift at the anterior septum pellucidum is 12 mm and mildly increased since presentation. Blood products related susceptibility on DWI. No larger area of diffusion restriction. No restricted diffusion elsewhere. On SWI no chronic cerebral blood products identified. Scattered white matter T2 and FLAIR hyperintensity on the late it to the acute edema surrounding the left frontal lobe hematoma. Mild T2 heterogeneity also in the central pons. No cortical encephalomalacia identified. Pituitary and cervicomedullary junction appear negative. Cerebellum appears negative. Vascular: Major intracranial vascular flow voids are preserved. Skull and upper cervical spine: Negative visible cervical spine. Visualized bone marrow signal is within normal limits. Sinuses/Orbits: Negative orbits. Stable paranasal sinus disease as seen by CT. Other: Mild mastoid effusion also stable. Negative visible scalp and face. IMPRESSION: 1. 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 2.5 cm enhancing tumor with larger surrounding acute hemorrhage (  hematoma volume now estimated at 82 mL and increased from approximately 64 mL at presentation). Moderate volume of intraventricular blood has progressed since presentation, but with stable ventricle size and configuration. Intracranial mass effect with rightward midline shift of 12 mL has mildly increased. Basilar cistern patency is stable. 2. Intermittently motion degraded exam, with no 2nd enhancing brain lesion is identified. 3. No other acute intracranial abnormality identified. Underlying moderate for age nonspecific cerebral white matter signal changes. Electronically Signed   By: VEAR Hurst M.D.   On: 06/03/2024 04:03   DG CHEST  PORT 1 VIEW Result Date: 06/02/2024 CLINICAL DATA:  CHF.  Altered mental status.  Slurred speech. EXAM: PORTABLE CHEST 1 VIEW COMPARISON:  11/16/2023 FINDINGS: Mild cardiomegaly with vascular congestion. Interstitial prominence could reflect interstitial edema. Stable mild elevation of the right hemidiaphragm with right base atelectasis. No effusions. IMPRESSION: Cardiomegaly with vascular congestion. Suspect mild interstitial edema. Right base atelectasis. Electronically Signed   By: Franky Crease M.D.   On: 06/02/2024 22:22   CT ANGIO HEAD NECK W WO CM (CODE STROKE) Result Date: 06/02/2024 EXAM: CTA HEAD AND NECK WITHOUT AND WITH 06/02/2024 06:50:16 PM TECHNIQUE: CTA of the head and neck was performed without and with the administration of 75 mL of iohexol  (OMNIPAQUE ) 350 MG/ML injection. Multiplanar 2D and/or 3D reformatted images are provided for review. Automated exposure control, iterative reconstruction, and/or weight based adjustment of the mA/kV was utilized to reduce the radiation dose to as low as reasonably achievable. Stenosis of the internal carotid arteries measured using NASCET criteria. COMPARISON: None available CLINICAL HISTORY: Neuro deficit, acute, stroke suspected. 75mL Omni 350 IV. Code stroke. Aphasia; slurred speech. LKW 0900; Michaela [phone]. FINDINGS: CTA NECK: AORTIC ARCH AND ARCH VESSELS: Atherosclerotic calcifications are present in the distal aortic arch. Grade vessel origins are within normal limits. No dissection or arterial injury. No significant stenosis of the brachiocephalic or subclavian arteries. CERVICAL CAROTID ARTERIES: Mild calcifications are present in the proximal ICA bilaterally. No significant stenoses are present. Moderate tortuosity is present in the left cervical ICA. No dissection or arterial injury. CERVICAL VERTEBRAL ARTERIES: No dissection, arterial injury, or significant stenosis. LUNGS AND MEDIASTINUM: Unremarkable. SOFT TISSUES: No acute abnormality.  BONES: Multilevel degenerative changes are present in the cervical spine. CTA HEAD: ANTERIOR CIRCULATION: Atherosclerotic calcifications are present in the cavernous internal carotid arteries bilaterally. No significant stenosis of the internal carotid arteries. No significant stenosis of the anterior cerebral arteries. No significant stenosis of the middle cerebral arteries. No aneurysm. POSTERIOR CIRCULATION: No significant stenosis of the posterior cerebral arteries. No significant stenosis of the basilar artery. No significant stenosis of the vertebral arteries. No aneurysm. OTHER: No dural venous sinus thrombosis on this non-dedicated study. No vascular malformation or spot sign is associated with the hemorrhage. There does appear to be some vascularity within the area suspected to be a mass. IMPRESSION: 1. No large vessel occlusion, hemodynamically significant stenosis, or aneurysm in the head or neck. 2. Atherosclerotic calcifications in the distal aortic arch, cavernous internal carotid arteries bilaterally, and proximal ICA bilaterally without significant stenosis. Moderate tortuosity of the left cervical ICA. Electronically signed by: Lonni Necessary MD 06/02/2024 07:18 PM EDT RP Workstation: [id number]   CT HEAD CODE STROKE WO CONTRAST` Result Date: 06/02/2024 EXAM: CT HEAD WITHOUT CONTRAST 06/02/2024 06:36:34 PM TECHNIQUE: CT of the head was performed without the administration of intravenous contrast. Automated exposure control, iterative reconstruction, and/or weight based adjustment of the mA/kV was utilized to reduce the radiation dose to  as low as reasonably achievable. COMPARISON: None available. CLINICAL HISTORY: stroke suspected. Non con. Code stroke. Aphasia; slurred speech. LKW 0900; Michaela [phone]. FINDINGS: BRAIN AND VENTRICLES: Hemorrhage in the anterior left frontal lobe measures 4.3 x 6.2 x 4.2 cm. Estimated volume of the hemorrhage is 56 ml. Layering blood products are  present. Within the anterior superior aspect of the hemorrhage is a more heterogeneous region suggesting an underlying mass which measures 3.4 x 1.9 cm. Marked surrounding vasogenic edema is present. Mass effect is present with 9 mm of midline shift. No other focal hemorrhage or mass lesion is present. No evidence of acute infarct. No hydrocephalus. No extra-axial collection. ORBITS: No acute abnormality. SINUSES: Chronic opacification of the right maxillary sinus present. A small fluid level is present in the left maxillary sinus. Mucosal thickening is present in the anterior ethmoid air cells and inferior frontal sinuses bilaterally. SOFT TISSUES AND SKULL: No acute soft tissue abnormality. No skull fracture. IMPRESSION: 1. Hemorrhage in the anterior left frontal lobe measuring 4.3 x 6.2 x 4.2 cm (estimated volume 56.0 mL) with layering blood products and a heterogeneous region in the anterior superior aspect suggesting an underlying mass measuring 3.4 x 1.9 cm. MRI of the head without and with contrast may be useful for further evaluation. 2. Marked surrounding vasogenic edema and mass effect with 9 mm of midline shift. Critical values were communicated to Dr. Michaela at 6:48 via the [facility] system Electronically signed by: Lonni Necessary MD 06/02/2024 06:48 PM EDT RP Workstation: [id number]      Subjective:  Patient seen and examined at bedside today.  Comfortable.  Lying on bed.  Blood pressure soft but he is asymptomatic.  No new complaints.  No trouble swallowing.  Alert and oriented.  Medically stable for discharge to acute inpatient rehab.   Discharge Exam: Vitals:   06/22/24 0809 06/22/24 0903  BP: (!) 86/51 93/61  Pulse: (!) 53 63  Resp: 11 13  Temp: 97.8 F (36.6 C)   SpO2: 100% 100%   Vitals:   06/22/24 0336 06/22/24 0500 06/22/24 0809 06/22/24 0903  BP: (!) 99/57  (!) 86/51 93/61  Pulse: (!) 57  (!) 53 63  Resp: 18  11 13   Temp: 98 F (36.7 C)  97.8 F (36.6 C)    TempSrc: Oral  Axillary   SpO2: 100%  100% 100%  Weight:  135 kg    Height:        General: Pt is alert, awake, not in acute distress, morbidly obese, staples on the scalp Cardiovascular: RRR, S1/S2 +, no rubs, no gallops Respiratory: CTA bilaterally, no wheezing, no rhonchi Abdominal: Soft, NT, ND, bowel sounds + Extremities: no edema, no cyanosis    The results of significant diagnostics from this hospitalization (including imaging, microbiology, ancillary and laboratory) are listed below for reference.     Microbiology: No results found for this or any previous visit (from the past 240 hours).   Labs: BNP (last 3 results) No results for input(s): BNP in the last 8760 hours. Basic Metabolic Panel: Recent Labs  Lab 06/16/24 0404 06/16/24 1335 06/17/24 0607 06/17/24 1228 06/17/24 1807 06/18/24 0237 06/21/24 0145  NA 153*   < > 153* 152* 149* 145 137  K 4.3  --  4.2  --   --  4.4 4.5  CL 119*  --  116*  --   --  113* 109  CO2 27  --  24  --   --  23 18*  GLUCOSE 149*  --  152*  --   --  166* 192*  BUN 52*  --  44*  --   --  40* 55*  CREATININE 0.96  --  0.91  --   --  0.90 1.24  CALCIUM  7.9*  --  8.1*  --   --  7.9* 7.9*   < > = values in this interval not displayed.   Liver Function Tests: No results for input(s): AST, ALT, ALKPHOS, BILITOT, PROT, ALBUMIN in the last 168 hours. No results for input(s): LIPASE, AMYLASE in the last 168 hours. No results for input(s): AMMONIA in the last 168 hours. CBC: Recent Labs  Lab 06/16/24 0404 06/17/24 0607 06/18/24 0237 06/21/24 0145  WBC 13.2* 14.4* 11.9* 19.3*  HGB 11.4* 13.0 12.3* 13.5  HCT 37.9* 41.1 38.7* 42.4  MCV 86.5 83.9 83.0 83.5  PLT 116* 117* 117* 134*   Cardiac Enzymes: No results for input(s): CKTOTAL, CKMB, CKMBINDEX, TROPONINI in the last 168 hours. BNP: Invalid input(s): POCBNP CBG: Recent Labs  Lab 06/21/24 1214 06/21/24 1539 06/21/24 2118 06/21/24 2222  06/22/24 0606  GLUCAP 169* 140* 176* 157* 144*   D-Dimer No results for input(s): DDIMER in the last 72 hours. Hgb A1c No results for input(s): HGBA1C in the last 72 hours. Lipid Profile No results for input(s): CHOL, HDL, LDLCALC, TRIG, CHOLHDL, LDLDIRECT in the last 72 hours. Thyroid  function studies No results for input(s): TSH, T4TOTAL, T3FREE, THYROIDAB in the last 72 hours.  Invalid input(s): FREET3 Anemia work up No results for input(s): VITAMINB12, FOLATE, FERRITIN, TIBC, IRON, RETICCTPCT in the last 72 hours. Urinalysis    Component Value Date/Time   COLORURINE YELLOW 06/11/2024 1237   APPEARANCEUR CLEAR 06/11/2024 1237   LABSPEC 1.021 06/11/2024 1237   PHURINE 7.0 06/11/2024 1237   GLUCOSEU NEGATIVE 06/11/2024 1237   HGBUR SMALL (A) 06/11/2024 1237   BILIRUBINUR NEGATIVE 06/11/2024 1237   KETONESUR NEGATIVE 06/11/2024 1237   PROTEINUR NEGATIVE 06/11/2024 1237   NITRITE NEGATIVE 06/11/2024 1237   LEUKOCYTESUR NEGATIVE 06/11/2024 1237   Sepsis Labs Recent Labs  Lab 06/16/24 0404 06/17/24 0607 06/18/24 0237 06/21/24 0145  WBC 13.2* 14.4* 11.9* 19.3*   Microbiology No results found for this or any previous visit (from the past 240 hours).  Please note: You were cared for by a hospitalist during your hospital stay. Once you are discharged, your primary care physician will handle any further medical issues. Please note that NO REFILLS for any discharge medications will be authorized once you are discharged, as it is imperative that you return to your primary care physician (or establish a relationship with a primary care physician if you do not have one) for your post hospital discharge needs so that they can reassess your need for medications and monitor your lab values.    Time coordinating discharge: 40 minutes  SIGNED:   Ivonne Mustache, MD  [facility] 06/22/2024, 10:23 AM Pager [phone]  If 7PM-7AM,  please contact night-coverage [email or web address] Password [email or web address]

## 2024-06-22 NOTE — TOC Progression Note (Signed)
 Transition of Care [facility]) - Progression Note    Patient Details  Name: Justin Rasmussen MRN: [id number] Date of Birth: [birth year]  Transition of Care [facility]) CM/SW Contact  [name], [place] Phone Number: 06/22/2024, 11:16 AM  Clinical Narrative:   CSW sent referral to [facility] and [facility]. [facility] can offer a bed for patient, but referral to [facility] is still pending. CSW met with spouse to provide update, she would like to hear back from [facility] before making a decision. CSW to follow.    Expected Discharge Plan: IP Rehab Facility Barriers to Discharge: Insurance Authorization               Expected Discharge Plan and Services   Discharge Planning Services: CM Consult   Living arrangements for the past 2 months: Single Family Home Expected Discharge Date: 06/22/24                                     Social Drivers of Health (SDOH) Interventions SDOH Screenings   Food Insecurity: No Food Insecurity (06/04/2024)  Housing: Low Risk  (06/04/2024)  Transportation Needs: No Transportation Needs (06/04/2024)  Utilities: Not At Risk (06/04/2024)  Depression (PHQ2-9): Low Risk  (07/12/2023)  Physical Activity: Unknown (06/01/2019)  Social Connections: Moderately Isolated (06/13/2024)  Stress: No Stress Concern Present (06/01/2019)  Tobacco Use: Medium Risk (06/05/2024)    Readmission Risk Interventions     No data to display

## 2024-07-12 ENCOUNTER — Encounter: Payer: Self-pay | Admitting: Cardiovascular Disease

## 2024-07-13 DIAGNOSIS — I110 Hypertensive heart disease with heart failure: Secondary | ICD-10-CM | POA: Diagnosis not present

## 2024-07-13 DIAGNOSIS — R1311 Dysphagia, oral phase: Secondary | ICD-10-CM | POA: Diagnosis not present

## 2024-07-13 DIAGNOSIS — Z6841 Body Mass Index (BMI) 40.0 and over, adult: Secondary | ICD-10-CM | POA: Diagnosis not present

## 2024-07-13 DIAGNOSIS — Z48812 Encounter for surgical aftercare following surgery on the circulatory system: Secondary | ICD-10-CM | POA: Diagnosis not present

## 2024-07-13 DIAGNOSIS — Z85841 Personal history of malignant neoplasm of brain: Secondary | ICD-10-CM | POA: Diagnosis not present

## 2024-07-13 DIAGNOSIS — I4510 Unspecified right bundle-branch block: Secondary | ICD-10-CM | POA: Diagnosis not present

## 2024-07-13 DIAGNOSIS — I272 Pulmonary hypertension, unspecified: Secondary | ICD-10-CM | POA: Diagnosis not present

## 2024-07-13 DIAGNOSIS — I69220 Aphasia following other nontraumatic intracranial hemorrhage: Secondary | ICD-10-CM | POA: Diagnosis not present

## 2024-07-13 DIAGNOSIS — Z8616 Personal history of COVID-19: Secondary | ICD-10-CM | POA: Diagnosis not present

## 2024-07-13 DIAGNOSIS — I69251 Hemiplegia and hemiparesis following other nontraumatic intracranial hemorrhage affecting right dominant side: Secondary | ICD-10-CM | POA: Diagnosis not present

## 2024-07-13 DIAGNOSIS — Z556 Problems related to health literacy: Secondary | ICD-10-CM | POA: Diagnosis not present

## 2024-07-13 DIAGNOSIS — I429 Cardiomyopathy, unspecified: Secondary | ICD-10-CM | POA: Diagnosis not present

## 2024-07-13 DIAGNOSIS — I5030 Unspecified diastolic (congestive) heart failure: Secondary | ICD-10-CM | POA: Diagnosis not present

## 2024-07-13 DIAGNOSIS — E119 Type 2 diabetes mellitus without complications: Secondary | ICD-10-CM | POA: Diagnosis not present

## 2024-07-13 DIAGNOSIS — J449 Chronic obstructive pulmonary disease, unspecified: Secondary | ICD-10-CM | POA: Diagnosis not present

## 2024-07-14 NOTE — Telephone Encounter (Signed)
 They will be opening up my schedule for clinic on December 3.  Can we please put him on for that day?  If he has not recovered enough we will switch her to a virtual visit.

## 2024-07-17 DIAGNOSIS — I110 Hypertensive heart disease with heart failure: Secondary | ICD-10-CM | POA: Diagnosis not present

## 2024-07-17 DIAGNOSIS — I5030 Unspecified diastolic (congestive) heart failure: Secondary | ICD-10-CM | POA: Diagnosis not present

## 2024-07-17 DIAGNOSIS — Z85841 Personal history of malignant neoplasm of brain: Secondary | ICD-10-CM | POA: Diagnosis not present

## 2024-07-17 DIAGNOSIS — J449 Chronic obstructive pulmonary disease, unspecified: Secondary | ICD-10-CM | POA: Diagnosis not present

## 2024-07-17 DIAGNOSIS — I69220 Aphasia following other nontraumatic intracranial hemorrhage: Secondary | ICD-10-CM | POA: Diagnosis not present

## 2024-07-17 DIAGNOSIS — I429 Cardiomyopathy, unspecified: Secondary | ICD-10-CM | POA: Diagnosis not present

## 2024-07-17 DIAGNOSIS — Z556 Problems related to health literacy: Secondary | ICD-10-CM | POA: Diagnosis not present

## 2024-07-17 DIAGNOSIS — E119 Type 2 diabetes mellitus without complications: Secondary | ICD-10-CM | POA: Diagnosis not present

## 2024-07-17 DIAGNOSIS — I4510 Unspecified right bundle-branch block: Secondary | ICD-10-CM | POA: Diagnosis not present

## 2024-07-17 DIAGNOSIS — R1311 Dysphagia, oral phase: Secondary | ICD-10-CM | POA: Diagnosis not present

## 2024-07-17 DIAGNOSIS — Z6841 Body Mass Index (BMI) 40.0 and over, adult: Secondary | ICD-10-CM | POA: Diagnosis not present

## 2024-07-17 DIAGNOSIS — Z48812 Encounter for surgical aftercare following surgery on the circulatory system: Secondary | ICD-10-CM | POA: Diagnosis not present

## 2024-07-17 DIAGNOSIS — I272 Pulmonary hypertension, unspecified: Secondary | ICD-10-CM | POA: Diagnosis not present

## 2024-07-17 DIAGNOSIS — Z8616 Personal history of COVID-19: Secondary | ICD-10-CM | POA: Diagnosis not present

## 2024-07-17 DIAGNOSIS — I69251 Hemiplegia and hemiparesis following other nontraumatic intracranial hemorrhage affecting right dominant side: Secondary | ICD-10-CM | POA: Diagnosis not present

## 2024-07-18 ENCOUNTER — Inpatient Hospital Stay: Admitting: Family Medicine

## 2024-07-18 DIAGNOSIS — I110 Hypertensive heart disease with heart failure: Secondary | ICD-10-CM | POA: Diagnosis not present

## 2024-07-18 DIAGNOSIS — I69220 Aphasia following other nontraumatic intracranial hemorrhage: Secondary | ICD-10-CM | POA: Diagnosis not present

## 2024-07-18 DIAGNOSIS — K769 Liver disease, unspecified: Secondary | ICD-10-CM | POA: Diagnosis not present

## 2024-07-18 DIAGNOSIS — J9611 Chronic respiratory failure with hypoxia: Secondary | ICD-10-CM | POA: Diagnosis not present

## 2024-07-18 DIAGNOSIS — R590 Localized enlarged lymph nodes: Secondary | ICD-10-CM | POA: Diagnosis not present

## 2024-07-18 DIAGNOSIS — R891 Abnormal level of hormones in specimens from other organs, systems and tissues: Secondary | ICD-10-CM | POA: Diagnosis not present

## 2024-07-18 DIAGNOSIS — E119 Type 2 diabetes mellitus without complications: Secondary | ICD-10-CM | POA: Diagnosis not present

## 2024-07-18 DIAGNOSIS — I2699 Other pulmonary embolism without acute cor pulmonale: Secondary | ICD-10-CM | POA: Diagnosis not present

## 2024-07-18 DIAGNOSIS — N62 Hypertrophy of breast: Secondary | ICD-10-CM | POA: Diagnosis not present

## 2024-07-18 DIAGNOSIS — K5730 Diverticulosis of large intestine without perforation or abscess without bleeding: Secondary | ICD-10-CM | POA: Diagnosis not present

## 2024-07-18 DIAGNOSIS — Z9889 Other specified postprocedural states: Secondary | ICD-10-CM | POA: Diagnosis not present

## 2024-07-18 DIAGNOSIS — R946 Abnormal results of thyroid function studies: Secondary | ICD-10-CM | POA: Diagnosis not present

## 2024-07-18 DIAGNOSIS — J9811 Atelectasis: Secondary | ICD-10-CM | POA: Diagnosis not present

## 2024-07-18 DIAGNOSIS — Z48812 Encounter for surgical aftercare following surgery on the circulatory system: Secondary | ICD-10-CM | POA: Diagnosis not present

## 2024-07-18 DIAGNOSIS — I2694 Multiple subsegmental pulmonary emboli without acute cor pulmonale: Secondary | ICD-10-CM | POA: Diagnosis not present

## 2024-07-18 DIAGNOSIS — C7931 Secondary malignant neoplasm of brain: Secondary | ICD-10-CM | POA: Diagnosis not present

## 2024-07-18 DIAGNOSIS — C779 Secondary and unspecified malignant neoplasm of lymph node, unspecified: Secondary | ICD-10-CM | POA: Diagnosis not present

## 2024-07-18 DIAGNOSIS — I4510 Unspecified right bundle-branch block: Secondary | ICD-10-CM | POA: Diagnosis not present

## 2024-07-18 DIAGNOSIS — K449 Diaphragmatic hernia without obstruction or gangrene: Secondary | ICD-10-CM | POA: Diagnosis not present

## 2024-07-18 DIAGNOSIS — Z8616 Personal history of COVID-19: Secondary | ICD-10-CM | POA: Diagnosis not present

## 2024-07-18 DIAGNOSIS — I69251 Hemiplegia and hemiparesis following other nontraumatic intracranial hemorrhage affecting right dominant side: Secondary | ICD-10-CM | POA: Diagnosis not present

## 2024-07-18 DIAGNOSIS — Z85841 Personal history of malignant neoplasm of brain: Secondary | ICD-10-CM | POA: Diagnosis not present

## 2024-07-18 DIAGNOSIS — C439 Malignant melanoma of skin, unspecified: Secondary | ICD-10-CM | POA: Diagnosis not present

## 2024-07-18 DIAGNOSIS — I272 Pulmonary hypertension, unspecified: Secondary | ICD-10-CM | POA: Diagnosis not present

## 2024-07-18 DIAGNOSIS — C4371 Malignant melanoma of right lower limb, including hip: Secondary | ICD-10-CM | POA: Diagnosis not present

## 2024-07-18 DIAGNOSIS — I5030 Unspecified diastolic (congestive) heart failure: Secondary | ICD-10-CM | POA: Diagnosis not present

## 2024-07-18 DIAGNOSIS — J449 Chronic obstructive pulmonary disease, unspecified: Secondary | ICD-10-CM | POA: Diagnosis not present

## 2024-07-18 DIAGNOSIS — Z556 Problems related to health literacy: Secondary | ICD-10-CM | POA: Diagnosis not present

## 2024-07-18 DIAGNOSIS — I2510 Atherosclerotic heart disease of native coronary artery without angina pectoris: Secondary | ICD-10-CM | POA: Diagnosis not present

## 2024-07-18 DIAGNOSIS — Z6841 Body Mass Index (BMI) 40.0 and over, adult: Secondary | ICD-10-CM | POA: Diagnosis not present

## 2024-07-18 DIAGNOSIS — I429 Cardiomyopathy, unspecified: Secondary | ICD-10-CM | POA: Diagnosis not present

## 2024-07-18 DIAGNOSIS — R1311 Dysphagia, oral phase: Secondary | ICD-10-CM | POA: Diagnosis not present

## 2024-07-18 NOTE — Telephone Encounter (Signed)
 Copied from CRM [id number]. Topic: Clinical - Home Health Verbal Orders >> Jul 18, 2024  4:30 PM Hadassah PARAS wrote: Caller/Agency: Delon from [facility] Number: [phone] Service Requested: Physical Therapy Frequency: 1 week 8 Any new concerns about the patient? No

## 2024-07-23 ENCOUNTER — Other Ambulatory Visit: Payer: Self-pay | Admitting: Family Medicine

## 2024-07-23 DIAGNOSIS — C7931 Secondary malignant neoplasm of brain: Secondary | ICD-10-CM

## 2024-07-23 NOTE — Telephone Encounter (Signed)
 Provider spoke with wife, Darice. Urgent referral placed for home PT. Message sent to referral specialist. Darice    Copied from CRM [id number]. Topic: Referral - Status >> Jul 23, 2024  2:17 PM Tinnie BROCKS wrote: Reason for CRM: Wife Darice calling for update on home health and PT request with [facility]. Verbal order request in chart but no referral seen. Please call pts wife back at [phone] for an update, she is concerned that he will get blood clots from lack of movement and is scared he will fall between the cracks with this request. She thinks he needs more PT time than one time a week. She thinks he needs more PT over anything else because standing is primary goal and he is unable to stand.

## 2024-07-23 NOTE — Telephone Encounter (Signed)
 Copied from CRM #[id number]. Topic: Clinical - Home Health Verbal Orders >> Jul 20, 2024 11:34 AM Antwanette L wrote: Caller/Agency: Carolyn/[facility] Callback Number: [phone] Service Requested: Skilled Nursing Frequency: 1w1, 2w2 Any new concerns about the patient? No

## 2024-07-24 ENCOUNTER — Telehealth: Payer: Self-pay

## 2024-07-24 DIAGNOSIS — I2699 Other pulmonary embolism without acute cor pulmonale: Secondary | ICD-10-CM | POA: Diagnosis not present

## 2024-07-24 DIAGNOSIS — C7931 Secondary malignant neoplasm of brain: Secondary | ICD-10-CM | POA: Diagnosis not present

## 2024-07-24 DIAGNOSIS — C439 Malignant melanoma of skin, unspecified: Secondary | ICD-10-CM | POA: Diagnosis not present

## 2024-07-24 DIAGNOSIS — I071 Rheumatic tricuspid insufficiency: Secondary | ICD-10-CM | POA: Diagnosis not present

## 2024-07-24 NOTE — Telephone Encounter (Signed)
 Copied from CRM [id number]. Topic: Clinical - Home Health Verbal Orders >> Jul 24, 2024  3:52 PM Berwyn MATSU wrote: Reason for CRM: Delon from [facility] # [phone] calling to check on status of Home Health Verbal Orders. Per Delon she went out last week 07/17/24 to evaluate patient and is requesting 1 week 8 .   May you please advise.

## 2024-07-24 NOTE — Telephone Encounter (Signed)
 Spoke with wife yesterday, she is concerned that the once weekly PT is not going to be enough to meet Justin Rasmussen's needs. Referral specialist has arranged for local Home health agency to evaluate as soon as possible for PT needs.    Copied from CRM [id number]. Topic: General - Inquiry >> Jul 24, 2024  8:09 AM Laymon HERO wrote: Reason for CRM: Carolyn/[facility] # [phone]  calling to check on status of Home Health Verbal Orders

## 2024-07-24 NOTE — Telephone Encounter (Signed)
 Task completed. Delon from [facility] will fax over the form for the provider's signature for verbal orders of home PT, 1 week 8.

## 2024-07-25 NOTE — Telephone Encounter (Signed)
 Sending to the covering provider as an FYI.   Received a call from Elveria Haddock [phone]) from [facility]. She was requesting a verbal order for skilled nursing for 1-2 times a week for the patient. The verbal order was not provided due to the concern of the family changing facilities. The patient's wife is requesting to switch to [facility]. Elveria Haddock was made aware and has placed the home health order on hold until further notice.

## 2024-07-25 NOTE — Telephone Encounter (Unsigned)
 Copied from CRM [id number]. Topic: Referral - Question >> Jul 25, 2024  2:28 PM Deaijah H wrote: Reason for CRM: Patients wife called in stating [facility] Can't do it therapy due to previous approval with [employer] w/ [facility] and wants to know if she needs to call [facility]  to cancel out authorization or if that is something Darice can do. Please call to confirm

## 2024-07-26 NOTE — Telephone Encounter (Signed)
 Task completed. Verbal order for increased home physical therapy provided to [facility] at [facility]. Per Delon, the patient and his wife do not want to have the skilled nursing and occupational therapy through [facility]. They are currently checking if  [facility] is a better option. No other inquiries during the call.

## 2024-07-26 NOTE — Telephone Encounter (Signed)
 Copied from CRM #[id number]. Topic: Clinical - Home Health Verbal Orders >> Jul 26, 2024  9:27 AM Joesph NOVAK wrote:  Caller/Agency: Delon from [facility] Callback Number: [phone] Service Requested: Physical Therapy Frequency: twice a week 5 weeks , starting December 1st  Any new concerns about the patient? No

## 2024-07-26 NOTE — Telephone Encounter (Unsigned)
 Copied from CRM [id number]. Topic: General - Other >> Jul 26, 2024  1:52 PM Kevelyn M wrote: Reason for CRM: [email or web address] with [facility] have to re-send orders in [place] name instead of Dr. Susa name until Dr. Colette returns because they will be rejected even with the note Darice added that's she's signing for Dr. Colette.   Call back if you have any additional questions: [phone] (Crystal)

## 2024-07-27 ENCOUNTER — Telehealth: Payer: Self-pay | Admitting: Family Medicine

## 2024-07-27 NOTE — Telephone Encounter (Unsigned)
 Copied from CRM [id number]. Topic: Clinical - Medical Advice >> Jul 27, 2024 12:16 PM Kevelyn M wrote: Reason for CRM: Home health Nurse calling in because she needs verbal order to cancel skilled nursing per the wife. A verbal order request has already sent in. Wife Elray) would like to keep the PT. She said called already. This needs to be done ASAP. Patient cannot start PT until skilled nursing is cancelled.  Call back: [phone]

## 2024-07-28 DIAGNOSIS — Z556 Problems related to health literacy: Secondary | ICD-10-CM | POA: Diagnosis not present

## 2024-07-28 DIAGNOSIS — I110 Hypertensive heart disease with heart failure: Secondary | ICD-10-CM | POA: Diagnosis not present

## 2024-07-28 DIAGNOSIS — I5030 Unspecified diastolic (congestive) heart failure: Secondary | ICD-10-CM | POA: Diagnosis not present

## 2024-07-28 DIAGNOSIS — Z48812 Encounter for surgical aftercare following surgery on the circulatory system: Secondary | ICD-10-CM | POA: Diagnosis not present

## 2024-07-28 DIAGNOSIS — E119 Type 2 diabetes mellitus without complications: Secondary | ICD-10-CM | POA: Diagnosis not present

## 2024-07-28 DIAGNOSIS — I69220 Aphasia following other nontraumatic intracranial hemorrhage: Secondary | ICD-10-CM | POA: Diagnosis not present

## 2024-07-28 DIAGNOSIS — R1311 Dysphagia, oral phase: Secondary | ICD-10-CM | POA: Diagnosis not present

## 2024-07-28 DIAGNOSIS — J449 Chronic obstructive pulmonary disease, unspecified: Secondary | ICD-10-CM | POA: Diagnosis not present

## 2024-07-28 DIAGNOSIS — Z85841 Personal history of malignant neoplasm of brain: Secondary | ICD-10-CM | POA: Diagnosis not present

## 2024-07-28 DIAGNOSIS — I69251 Hemiplegia and hemiparesis following other nontraumatic intracranial hemorrhage affecting right dominant side: Secondary | ICD-10-CM | POA: Diagnosis not present

## 2024-07-28 DIAGNOSIS — Z8616 Personal history of COVID-19: Secondary | ICD-10-CM | POA: Diagnosis not present

## 2024-07-28 DIAGNOSIS — I429 Cardiomyopathy, unspecified: Secondary | ICD-10-CM | POA: Diagnosis not present

## 2024-07-28 DIAGNOSIS — Z6841 Body Mass Index (BMI) 40.0 and over, adult: Secondary | ICD-10-CM | POA: Diagnosis not present

## 2024-07-28 DIAGNOSIS — I272 Pulmonary hypertension, unspecified: Secondary | ICD-10-CM | POA: Diagnosis not present

## 2024-07-28 DIAGNOSIS — I4510 Unspecified right bundle-branch block: Secondary | ICD-10-CM | POA: Diagnosis not present

## 2024-07-29 NOTE — Telephone Encounter (Signed)
 Spoke with Elveria Haddock, RN. On 07/27/24 1645.  Due to lack of skilled nursing needs, she will discharge patient from skilled nursing visits as he needs physical therapy more often.  This provider had previously spoken with Mrs. Abbs concerning this need for PT who is in agreement.

## 2024-07-31 ENCOUNTER — Inpatient Hospital Stay: Admitting: Family Medicine

## 2024-07-31 DIAGNOSIS — Z48812 Encounter for surgical aftercare following surgery on the circulatory system: Secondary | ICD-10-CM | POA: Diagnosis not present

## 2024-07-31 DIAGNOSIS — I429 Cardiomyopathy, unspecified: Secondary | ICD-10-CM | POA: Diagnosis not present

## 2024-07-31 DIAGNOSIS — I69220 Aphasia following other nontraumatic intracranial hemorrhage: Secondary | ICD-10-CM | POA: Diagnosis not present

## 2024-07-31 DIAGNOSIS — Z85841 Personal history of malignant neoplasm of brain: Secondary | ICD-10-CM | POA: Diagnosis not present

## 2024-07-31 DIAGNOSIS — Z6841 Body Mass Index (BMI) 40.0 and over, adult: Secondary | ICD-10-CM | POA: Diagnosis not present

## 2024-07-31 DIAGNOSIS — Z8616 Personal history of COVID-19: Secondary | ICD-10-CM | POA: Diagnosis not present

## 2024-07-31 DIAGNOSIS — I110 Hypertensive heart disease with heart failure: Secondary | ICD-10-CM | POA: Diagnosis not present

## 2024-07-31 DIAGNOSIS — R1311 Dysphagia, oral phase: Secondary | ICD-10-CM | POA: Diagnosis not present

## 2024-07-31 DIAGNOSIS — I272 Pulmonary hypertension, unspecified: Secondary | ICD-10-CM | POA: Diagnosis not present

## 2024-07-31 DIAGNOSIS — Z556 Problems related to health literacy: Secondary | ICD-10-CM | POA: Diagnosis not present

## 2024-07-31 DIAGNOSIS — I5030 Unspecified diastolic (congestive) heart failure: Secondary | ICD-10-CM | POA: Diagnosis not present

## 2024-07-31 DIAGNOSIS — E119 Type 2 diabetes mellitus without complications: Secondary | ICD-10-CM | POA: Diagnosis not present

## 2024-07-31 DIAGNOSIS — I69251 Hemiplegia and hemiparesis following other nontraumatic intracranial hemorrhage affecting right dominant side: Secondary | ICD-10-CM | POA: Diagnosis not present

## 2024-07-31 DIAGNOSIS — J449 Chronic obstructive pulmonary disease, unspecified: Secondary | ICD-10-CM | POA: Diagnosis not present

## 2024-07-31 DIAGNOSIS — I4510 Unspecified right bundle-branch block: Secondary | ICD-10-CM | POA: Diagnosis not present

## 2024-08-06 DIAGNOSIS — Z6841 Body Mass Index (BMI) 40.0 and over, adult: Secondary | ICD-10-CM | POA: Diagnosis not present

## 2024-08-06 DIAGNOSIS — R1311 Dysphagia, oral phase: Secondary | ICD-10-CM | POA: Diagnosis not present

## 2024-08-06 DIAGNOSIS — I4510 Unspecified right bundle-branch block: Secondary | ICD-10-CM | POA: Diagnosis not present

## 2024-08-06 DIAGNOSIS — E119 Type 2 diabetes mellitus without complications: Secondary | ICD-10-CM | POA: Diagnosis not present

## 2024-08-06 DIAGNOSIS — I429 Cardiomyopathy, unspecified: Secondary | ICD-10-CM | POA: Diagnosis not present

## 2024-08-06 DIAGNOSIS — Z85841 Personal history of malignant neoplasm of brain: Secondary | ICD-10-CM | POA: Diagnosis not present

## 2024-08-06 DIAGNOSIS — Z8616 Personal history of COVID-19: Secondary | ICD-10-CM | POA: Diagnosis not present

## 2024-08-06 DIAGNOSIS — I272 Pulmonary hypertension, unspecified: Secondary | ICD-10-CM | POA: Diagnosis not present

## 2024-08-06 DIAGNOSIS — J449 Chronic obstructive pulmonary disease, unspecified: Secondary | ICD-10-CM | POA: Diagnosis not present

## 2024-08-06 DIAGNOSIS — Z48812 Encounter for surgical aftercare following surgery on the circulatory system: Secondary | ICD-10-CM | POA: Diagnosis not present

## 2024-08-06 DIAGNOSIS — Z556 Problems related to health literacy: Secondary | ICD-10-CM | POA: Diagnosis not present

## 2024-08-06 DIAGNOSIS — I110 Hypertensive heart disease with heart failure: Secondary | ICD-10-CM | POA: Diagnosis not present

## 2024-08-06 DIAGNOSIS — I69220 Aphasia following other nontraumatic intracranial hemorrhage: Secondary | ICD-10-CM | POA: Diagnosis not present

## 2024-08-06 DIAGNOSIS — I5030 Unspecified diastolic (congestive) heart failure: Secondary | ICD-10-CM | POA: Diagnosis not present

## 2024-08-06 DIAGNOSIS — I69251 Hemiplegia and hemiparesis following other nontraumatic intracranial hemorrhage affecting right dominant side: Secondary | ICD-10-CM | POA: Diagnosis not present

## 2024-08-08 DIAGNOSIS — R1311 Dysphagia, oral phase: Secondary | ICD-10-CM | POA: Diagnosis not present

## 2024-08-08 DIAGNOSIS — I5030 Unspecified diastolic (congestive) heart failure: Secondary | ICD-10-CM | POA: Diagnosis not present

## 2024-08-08 DIAGNOSIS — I429 Cardiomyopathy, unspecified: Secondary | ICD-10-CM | POA: Diagnosis not present

## 2024-08-08 DIAGNOSIS — Z8616 Personal history of COVID-19: Secondary | ICD-10-CM | POA: Diagnosis not present

## 2024-08-08 DIAGNOSIS — I272 Pulmonary hypertension, unspecified: Secondary | ICD-10-CM | POA: Diagnosis not present

## 2024-08-08 DIAGNOSIS — J449 Chronic obstructive pulmonary disease, unspecified: Secondary | ICD-10-CM | POA: Diagnosis not present

## 2024-08-08 DIAGNOSIS — Z556 Problems related to health literacy: Secondary | ICD-10-CM | POA: Diagnosis not present

## 2024-08-08 DIAGNOSIS — I69220 Aphasia following other nontraumatic intracranial hemorrhage: Secondary | ICD-10-CM | POA: Diagnosis not present

## 2024-08-08 DIAGNOSIS — Z85841 Personal history of malignant neoplasm of brain: Secondary | ICD-10-CM | POA: Diagnosis not present

## 2024-08-08 DIAGNOSIS — Z48812 Encounter for surgical aftercare following surgery on the circulatory system: Secondary | ICD-10-CM | POA: Diagnosis not present

## 2024-08-08 DIAGNOSIS — I69251 Hemiplegia and hemiparesis following other nontraumatic intracranial hemorrhage affecting right dominant side: Secondary | ICD-10-CM | POA: Diagnosis not present

## 2024-08-08 DIAGNOSIS — I110 Hypertensive heart disease with heart failure: Secondary | ICD-10-CM | POA: Diagnosis not present

## 2024-08-08 DIAGNOSIS — E119 Type 2 diabetes mellitus without complications: Secondary | ICD-10-CM | POA: Diagnosis not present

## 2024-08-08 DIAGNOSIS — I4510 Unspecified right bundle-branch block: Secondary | ICD-10-CM | POA: Diagnosis not present

## 2024-08-08 DIAGNOSIS — Z6841 Body Mass Index (BMI) 40.0 and over, adult: Secondary | ICD-10-CM | POA: Diagnosis not present

## 2024-08-09 ENCOUNTER — Inpatient Hospital Stay: Admitting: Family Medicine

## 2024-08-09 ENCOUNTER — Other Ambulatory Visit: Payer: Self-pay | Admitting: Family Medicine

## 2024-08-09 ENCOUNTER — Other Ambulatory Visit: Payer: Self-pay | Admitting: Cardiovascular Disease

## 2024-08-09 NOTE — Telephone Encounter (Unsigned)
 Copied from CRM #[id number]. Topic: Clinical - Medication Refill >> Aug 09, 2024  8:27 AM Brittany M wrote: Medication: dexamethasone  (DECADRON ) 2 MG tablet ; levETIRAcetam  (KEPPRA ) 500 MG tablet ;  rosuvastatin  (CRESTOR ) 20 MG tablet  Has the patient contacted their pharmacy? Yes (Agent: If no, request that the patient contact the pharmacy for the refill. If patient does not wish to contact the pharmacy document the reason why and proceed with request.) (Agent: If yes, when and what did the pharmacy advise?)  This is the patient's preferred pharmacy:  [facility] [address], [place] - [address] [place] [address] [place] [address] Phone: [phone] Fax: [phone]    Is this the correct pharmacy for this prescription? Yes If no, delete pharmacy and type the correct one.   Has the prescription been filled recently? Yes  Is the patient out of the medication? Yes  Has the patient been seen for an appointment in the last year OR does the patient have an upcoming appointment? Yes  Can we respond through MyChart? Yes  Agent: Please be advised that Rx refills may take up to 3 business days. We ask that you follow-up with your pharmacy.

## 2024-08-10 ENCOUNTER — Ambulatory Visit: Admitting: Family Medicine

## 2024-08-10 ENCOUNTER — Encounter: Payer: Self-pay | Admitting: Family Medicine

## 2024-08-10 VITALS — BP 127/68 | HR 88 | Ht 67.0 in | Wt 259.0 lb

## 2024-08-10 DIAGNOSIS — C7931 Secondary malignant neoplasm of brain: Secondary | ICD-10-CM

## 2024-08-10 DIAGNOSIS — Z9889 Other specified postprocedural states: Secondary | ICD-10-CM | POA: Insufficient documentation

## 2024-08-10 MED ORDER — LEVETIRACETAM 500 MG PO TABS: 500.0000 mg | ORAL_TABLET | Freq: Two times a day (BID) | ORAL | 0 refills | Status: DC

## 2024-08-10 MED ORDER — DEXAMETHASONE 2 MG PO TABS: 2.0000 mg | ORAL_TABLET | Freq: Two times a day (BID) | ORAL | 0 refills | Status: DC

## 2024-08-10 NOTE — Progress Notes (Signed)
 Established Patient Office Visit  Subjective   Patient ID: Justin Rasmussen, male    DOB: [birth year]  Age: 68 y.o. MRN: [id number]  Chief Complaint  Patient presents with   Follow-up    Follow up on chronic health conditions    Medication Problem    Wife has questions if the steroids he is on will cause his BS level increase should he be covered with insulin ? Also should he be on a K+ supplement     Chart review:  06/02/24-06/22/24:  admitted for intracranial hemorrhage: Presented with right-sided weakness, aphasia.  CT head showed hemorrhage in the anterior left frontal lobe measuring 4.3 X6.2X 4.2 cm, worsening edema with mass effect and 9 mm midline shift.MRI brain (9/28) significant for a hemorrhagic tumor in the anterior left frontal lobe, with a 2.5 cm enhancing tumor and increased hematoma. Craniotomy on 06/05/24:  Metastatic melanoma to brain.    Diagnosed with PE via CT scan on 11/12 Unable to be on blood thinners  due to bleeding in the brain.   Blood work due next Wednesday before treatment. Will address K+ levels at that time.  Declines labs today due to upcoming visit.  Last K+ level 5.2 in October. Getting Immunotherapy and radiation therapy through [facility].  Has oncologist with [facility]. Does not have neurology specialist: will send referral today.  Needs refills on Keppra  and dexamethasone  until sees neurology.  Fasting blood sugar is 116. Questions flu shot: will inquire if this  is appropriate, if yes, clinical support can give influenza vaccine.    Getting PT twice per week Able to stand for longer. Still not walking.  Exercises between sessions.   Follow-up with PCP in January.       ROS    Objective:     BP 127/68 (BP Location: Left Arm, Patient Position: Sitting, Cuff Size: Large)   Pulse 88   Ht 5' 7 (1.702 m)   Wt 259 lb (117.5 kg)   SpO2 94%   BMI 40.57 kg/m    Physical Exam Vitals and nursing note reviewed.  Constitutional:       General: He is not in acute distress.    Appearance: Normal appearance.  Cardiovascular:     Rate and Rhythm: Normal rate and regular rhythm.     Heart sounds: Normal heart sounds.  Pulmonary:     Effort: Pulmonary effort is normal.     Breath sounds: Normal breath sounds.  Skin:    General: Skin is warm and dry.  Neurological:     General: No focal deficit present.     Mental Status: He is alert. Mental status is at baseline.  Psychiatric:        Mood and Affect: Mood normal.        Behavior: Behavior normal.        Thought Content: Thought content normal.        Judgment: Judgment normal.      No results found for any visits on 08/10/24.    The 10-year ASCVD risk score (Arnett DK, et al., 2019) is: 17.1%    Assessment & Plan:   Problem List Items Addressed This Visit     Melanoma metastatic to brain [facility]) - Primary   Relevant Medications   dexamethasone  (DECADRON ) 2 MG tablet   levETIRAcetam  (KEPPRA ) 500 MG tablet   Other Relevant Orders   Ambulatory referral to Neurology   S/P craniotomy   Relevant Medications   dexamethasone  (DECADRON ) 2 MG tablet  levETIRAcetam  (KEPPRA ) 500 MG tablet   Other Relevant Orders   Ambulatory referral to Neurology  Agrees with plan of care discussed.  Questions answered.   Return in about 5 weeks (around 09/14/2024) for with PCP .    Darice JONELLE Brownie, FNP

## 2024-08-14 ENCOUNTER — Encounter: Payer: Self-pay | Admitting: Cardiovascular Disease

## 2024-08-14 NOTE — Telephone Encounter (Signed)
 Requested dose of Crestor  10 mg daily is not on Pt's medication list.  Additionally same pharmacy submitted request for correct dosage of Crestor  to Pt's primary care doctor.  Pt has not been seen for follow up by Dr. Francyne since 2024.  Last Crestor  refill was filled by PCP.  Message sent to Pt to advise overdue for follow up.

## 2024-08-16 DIAGNOSIS — C709 Malignant neoplasm of meninges, unspecified: Secondary | ICD-10-CM | POA: Diagnosis not present

## 2024-08-16 DIAGNOSIS — C7931 Secondary malignant neoplasm of brain: Secondary | ICD-10-CM | POA: Diagnosis not present

## 2024-08-22 ENCOUNTER — Other Ambulatory Visit: Payer: Self-pay | Admitting: Cardiovascular Disease

## 2024-08-22 DIAGNOSIS — C7931 Secondary malignant neoplasm of brain: Secondary | ICD-10-CM | POA: Diagnosis not present

## 2024-08-22 DIAGNOSIS — R7402 Elevation of levels of lactic acid dehydrogenase (LDH): Secondary | ICD-10-CM | POA: Diagnosis not present

## 2024-08-22 DIAGNOSIS — Z9289 Personal history of other medical treatment: Secondary | ICD-10-CM | POA: Diagnosis not present

## 2024-08-22 DIAGNOSIS — C439 Malignant melanoma of skin, unspecified: Secondary | ICD-10-CM | POA: Diagnosis not present

## 2024-08-22 NOTE — Telephone Encounter (Signed)
 Patient need make an appointment with Jerel Balding, MD for further refills. 2nd attempt

## 2024-08-24 ENCOUNTER — Encounter: Payer: Self-pay | Admitting: Family Medicine

## 2024-08-26 ENCOUNTER — Other Ambulatory Visit: Payer: Self-pay | Admitting: Family Medicine

## 2024-08-26 DIAGNOSIS — C7931 Secondary malignant neoplasm of brain: Secondary | ICD-10-CM

## 2024-08-27 NOTE — Telephone Encounter (Signed)
 Thanks, Randall. Indeed, very sad.  We can refill his cardiac meds as long as they still want to take them. He does not need an appointment.

## 2024-08-27 NOTE — Telephone Encounter (Signed)
 Message from wife, Justin Rasmussen. She is not happy with [facility] and requested another referral for home PT. Referral specialist is working on this currently.   Copied from CRM [id number]. Topic: General - Other >> Aug 27, 2024  2:37 PM Justin Rasmussen wrote: Reason for CRM: [facility] is calling to let pcp know she was not able to reach patient last week for visit. He did not come to the door when she went by.

## 2024-08-28 ENCOUNTER — Encounter: Payer: Self-pay | Admitting: Neurology

## 2024-08-28 MED ORDER — ROSUVASTATIN CALCIUM 20 MG PO TABS: 20.0000 mg | ORAL_TABLET | Freq: Every day | ORAL | 3 refills | Status: DC

## 2024-08-28 NOTE — Telephone Encounter (Signed)
 He can restart the carvedilol  at half the previous dose: take 3.125 mg twice daily. Is he having swelling or shortness of breath? Is he eating and drinking normally? If yes to both those questions, he should also restart the farxiga  10 mg daily, please. If not short of breath and.or not drinking normal amounts of fluid, do not start the farxiga .

## 2024-08-29 MED ORDER — CARVEDILOL 3.125 MG PO TABS: 3.1250 mg | ORAL_TABLET | Freq: Two times a day (BID) | ORAL | 3 refills | Status: DC

## 2024-10-07 DEATH — deceased

## 2024-10-29 ENCOUNTER — Ambulatory Visit: Payer: Self-pay | Admitting: Neurology
